# Patient Record
Sex: Female | Born: 1970
Health system: Southern US, Community
[De-identification: ages and names within clinical notes are randomized; demographics above are authoritative.]

## PROBLEM LIST (undated history)

## (undated) DIAGNOSIS — F419 Anxiety disorder, unspecified: Secondary | ICD-10-CM

## (undated) DIAGNOSIS — Z87442 Personal history of urinary calculi: Secondary | ICD-10-CM

## (undated) DIAGNOSIS — M797 Fibromyalgia: Secondary | ICD-10-CM

## (undated) DIAGNOSIS — E041 Nontoxic single thyroid nodule: Secondary | ICD-10-CM

## (undated) DIAGNOSIS — F329 Major depressive disorder, single episode, unspecified: Secondary | ICD-10-CM

## (undated) DIAGNOSIS — E669 Obesity, unspecified: Secondary | ICD-10-CM

## (undated) DIAGNOSIS — R52 Pain, unspecified: Secondary | ICD-10-CM

## (undated) DIAGNOSIS — R109 Unspecified abdominal pain: Secondary | ICD-10-CM

## (undated) DIAGNOSIS — G459 Transient cerebral ischemic attack, unspecified: Secondary | ICD-10-CM

## (undated) DIAGNOSIS — G4730 Sleep apnea, unspecified: Secondary | ICD-10-CM

## (undated) DIAGNOSIS — T4145XA Adverse effect of unspecified anesthetic, initial encounter: Secondary | ICD-10-CM

## (undated) DIAGNOSIS — C439 Malignant melanoma of skin, unspecified: Secondary | ICD-10-CM

## (undated) DIAGNOSIS — F32A Depression, unspecified: Secondary | ICD-10-CM

## (undated) DIAGNOSIS — D649 Anemia, unspecified: Secondary | ICD-10-CM

## (undated) DIAGNOSIS — J329 Chronic sinusitis, unspecified: Secondary | ICD-10-CM

## (undated) DIAGNOSIS — R197 Diarrhea, unspecified: Secondary | ICD-10-CM

## (undated) DIAGNOSIS — N649 Disorder of breast, unspecified: Secondary | ICD-10-CM

## (undated) DIAGNOSIS — R739 Hyperglycemia, unspecified: Secondary | ICD-10-CM

## (undated) DIAGNOSIS — M542 Cervicalgia: Secondary | ICD-10-CM

## (undated) DIAGNOSIS — N390 Urinary tract infection, site not specified: Secondary | ICD-10-CM

## (undated) DIAGNOSIS — R002 Palpitations: Secondary | ICD-10-CM

## (undated) DIAGNOSIS — F319 Bipolar disorder, unspecified: Secondary | ICD-10-CM

## (undated) DIAGNOSIS — K219 Gastro-esophageal reflux disease without esophagitis: Secondary | ICD-10-CM

## (undated) DIAGNOSIS — B019 Varicella without complication: Secondary | ICD-10-CM

## (undated) DIAGNOSIS — T7840XA Allergy, unspecified, initial encounter: Secondary | ICD-10-CM

## (undated) DIAGNOSIS — E785 Hyperlipidemia, unspecified: Secondary | ICD-10-CM

## (undated) DIAGNOSIS — R0602 Shortness of breath: Secondary | ICD-10-CM

## (undated) HISTORY — DX: Hyperglycemia, unspecified: R73.9

## (undated) HISTORY — DX: Transient cerebral ischemic attack, unspecified: G45.9

## (undated) HISTORY — DX: Pain, unspecified: R52

## (undated) HISTORY — DX: Hyperlipidemia, unspecified: E78.5

## (undated) HISTORY — PX: COLONOSCOPY: SHX174

## (undated) HISTORY — DX: Fibromyalgia: M79.7

## (undated) HISTORY — DX: Diarrhea, unspecified: R19.7

## (undated) HISTORY — DX: Unspecified abdominal pain: R10.9

## (undated) HISTORY — DX: Disorder of breast, unspecified: N64.9

## (undated) HISTORY — PX: BACK SURGERY: SHX140

## (undated) HISTORY — DX: Allergy, unspecified, initial encounter: T78.40XA

## (undated) HISTORY — DX: Obesity, unspecified: E66.9

## (undated) HISTORY — DX: Chronic sinusitis, unspecified: J32.9

## (undated) HISTORY — DX: Malignant melanoma of skin, unspecified: C43.9

## (undated) HISTORY — DX: Bipolar disorder, unspecified: F31.9

## (undated) HISTORY — DX: Anxiety disorder, unspecified: F41.9

## (undated) HISTORY — DX: Major depressive disorder, single episode, unspecified: F32.9

## (undated) HISTORY — DX: Cervicalgia: M54.2

## (undated) HISTORY — DX: Palpitations: R00.2

## (undated) HISTORY — DX: Nontoxic single thyroid nodule: E04.1

## (undated) HISTORY — DX: Varicella without complication: B01.9

## (undated) HISTORY — DX: Urinary tract infection, site not specified: N39.0

## (undated) HISTORY — PX: CHOLECYSTECTOMY: SHX55

## (undated) HISTORY — PX: ABDOMINAL HYSTERECTOMY: SHX81

## (undated) HISTORY — DX: Depression, unspecified: F32.A

## (undated) HISTORY — DX: Anemia, unspecified: D64.9

---
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
## 1997-07-18 DIAGNOSIS — T8859XA Other complications of anesthesia, initial encounter: Secondary | ICD-10-CM

## 1997-07-18 HISTORY — DX: Other complications of anesthesia, initial encounter: T88.59XA

## 1997-07-18 HISTORY — PX: BACK SURGERY: SHX140

## 1997-10-15 ENCOUNTER — Emergency Department (HOSPITAL_COMMUNITY): Admission: EM | Admit: 1997-10-15 | Discharge: 1997-10-15 | Payer: Self-pay | Admitting: Emergency Medicine

## 1997-10-18 ENCOUNTER — Emergency Department (HOSPITAL_COMMUNITY): Admission: EM | Admit: 1997-10-18 | Discharge: 1997-10-18 | Payer: Self-pay | Admitting: Emergency Medicine

## 1997-10-26 ENCOUNTER — Ambulatory Visit (HOSPITAL_COMMUNITY): Admission: RE | Admit: 1997-10-26 | Discharge: 1997-10-26 | Payer: Self-pay | Admitting: Neurosurgery

## 1997-11-17 ENCOUNTER — Inpatient Hospital Stay (HOSPITAL_COMMUNITY): Admission: RE | Admit: 1997-11-17 | Discharge: 1997-11-20 | Payer: Self-pay | Admitting: Neurosurgery

## 1997-12-28 ENCOUNTER — Ambulatory Visit (HOSPITAL_COMMUNITY): Admission: RE | Admit: 1997-12-28 | Discharge: 1997-12-28 | Payer: Self-pay | Admitting: Neurosurgery

## 1998-07-03 ENCOUNTER — Encounter: Admission: RE | Admit: 1998-07-03 | Discharge: 1998-07-03 | Payer: Self-pay | Admitting: *Deleted

## 1998-08-31 ENCOUNTER — Other Ambulatory Visit: Admission: RE | Admit: 1998-08-31 | Discharge: 1998-08-31 | Payer: Self-pay | Admitting: *Deleted

## 1998-09-24 ENCOUNTER — Ambulatory Visit (HOSPITAL_COMMUNITY): Admission: RE | Admit: 1998-09-24 | Discharge: 1998-09-24 | Payer: Self-pay | Admitting: *Deleted

## 1998-09-24 ENCOUNTER — Encounter: Payer: Self-pay | Admitting: *Deleted

## 1999-01-13 ENCOUNTER — Emergency Department (HOSPITAL_COMMUNITY): Admission: EM | Admit: 1999-01-13 | Discharge: 1999-01-13 | Payer: Self-pay | Admitting: Emergency Medicine

## 1999-02-04 ENCOUNTER — Ambulatory Visit (HOSPITAL_COMMUNITY): Admission: RE | Admit: 1999-02-04 | Discharge: 1999-02-04 | Payer: Self-pay | Admitting: Family Medicine

## 1999-02-04 ENCOUNTER — Encounter: Payer: Self-pay | Admitting: Family Medicine

## 2000-03-14 ENCOUNTER — Emergency Department (HOSPITAL_COMMUNITY): Admission: EM | Admit: 2000-03-14 | Discharge: 2000-03-14 | Payer: Self-pay | Admitting: Emergency Medicine

## 2000-03-15 ENCOUNTER — Emergency Department (HOSPITAL_COMMUNITY): Admission: EM | Admit: 2000-03-15 | Discharge: 2000-03-15 | Payer: Self-pay | Admitting: Emergency Medicine

## 2000-03-15 ENCOUNTER — Encounter (INDEPENDENT_AMBULATORY_CARE_PROVIDER_SITE_OTHER): Payer: Self-pay | Admitting: *Deleted

## 2000-03-15 ENCOUNTER — Encounter: Payer: Self-pay | Admitting: Emergency Medicine

## 2000-04-07 ENCOUNTER — Other Ambulatory Visit: Admission: RE | Admit: 2000-04-07 | Discharge: 2000-04-07 | Payer: Self-pay | Admitting: Internal Medicine

## 2000-09-20 ENCOUNTER — Ambulatory Visit (HOSPITAL_COMMUNITY): Admission: RE | Admit: 2000-09-20 | Discharge: 2000-09-20 | Payer: Self-pay | Admitting: *Deleted

## 2000-11-29 ENCOUNTER — Ambulatory Visit (HOSPITAL_COMMUNITY): Admission: RE | Admit: 2000-11-29 | Discharge: 2000-11-29 | Payer: Self-pay | Admitting: Neurosurgery

## 2000-12-21 ENCOUNTER — Encounter: Admission: RE | Admit: 2000-12-21 | Discharge: 2000-12-21 | Payer: Self-pay | Admitting: Neurosurgery

## 2001-01-19 ENCOUNTER — Encounter: Admission: RE | Admit: 2001-01-19 | Discharge: 2001-01-19 | Payer: Self-pay | Admitting: Neurosurgery

## 2001-03-14 ENCOUNTER — Encounter: Admission: RE | Admit: 2001-03-14 | Discharge: 2001-03-14 | Payer: Self-pay | Admitting: Neurosurgery

## 2001-04-17 ENCOUNTER — Encounter: Admission: RE | Admit: 2001-04-17 | Discharge: 2001-07-16 | Payer: Self-pay | Admitting: Surgical Oncology

## 2001-05-25 ENCOUNTER — Ambulatory Visit (HOSPITAL_COMMUNITY): Admission: RE | Admit: 2001-05-25 | Discharge: 2001-05-25 | Payer: Self-pay | Admitting: Neurosurgery

## 2001-07-30 ENCOUNTER — Encounter: Admission: RE | Admit: 2001-07-30 | Discharge: 2001-10-28 | Payer: Self-pay

## 2004-06-25 ENCOUNTER — Emergency Department (HOSPITAL_COMMUNITY): Admission: EM | Admit: 2004-06-25 | Discharge: 2004-06-25 | Payer: Self-pay | Admitting: Emergency Medicine

## 2005-05-17 ENCOUNTER — Other Ambulatory Visit: Admission: RE | Admit: 2005-05-17 | Discharge: 2005-05-17 | Payer: Self-pay | Admitting: Internal Medicine

## 2005-07-24 ENCOUNTER — Emergency Department (HOSPITAL_COMMUNITY): Admission: EM | Admit: 2005-07-24 | Discharge: 2005-07-24 | Payer: Self-pay | Admitting: Emergency Medicine

## 2007-02-22 ENCOUNTER — Emergency Department (HOSPITAL_COMMUNITY): Admission: EM | Admit: 2007-02-22 | Discharge: 2007-02-22 | Payer: Self-pay | Admitting: Emergency Medicine

## 2009-07-18 HISTORY — PX: MELANOMA EXCISION: SHX5266

## 2009-08-07 ENCOUNTER — Ambulatory Visit: Payer: Self-pay | Admitting: Hematology and Oncology

## 2009-08-11 ENCOUNTER — Encounter (INDEPENDENT_AMBULATORY_CARE_PROVIDER_SITE_OTHER): Payer: Self-pay | Admitting: *Deleted

## 2009-08-11 LAB — CBC & DIFF AND RETIC
Basophils Absolute: 0 10*3/uL (ref 0.0–0.1)
Eosinophils Absolute: 0 10*3/uL (ref 0.0–0.5)
HGB: 12.5 g/dL (ref 11.6–15.9)
LYMPH%: 20.9 % (ref 14.0–49.7)
MONO#: 0.6 10*3/uL (ref 0.1–0.9)
NEUT%: 71.9 % (ref 38.4–76.8)
Platelets: 294 10*3/uL (ref 145–400)
WBC: 9.4 10*3/uL (ref 3.9–10.3)
lymph#: 2 10*3/uL (ref 0.9–3.3)

## 2009-08-11 LAB — URINALYSIS, MICROSCOPIC - CHCC
Bilirubin (Urine): NEGATIVE
Blood: NEGATIVE
Glucose: NEGATIVE g/dL
Ketones: NEGATIVE mg/dL
pH: 6 (ref 4.6–8.0)

## 2009-08-11 LAB — CHCC SMEAR

## 2009-08-13 LAB — FERRITIN: Ferritin: 6 ng/mL — ABNORMAL LOW (ref 10–291)

## 2009-08-13 LAB — PROTEIN ELECTROPHORESIS, SERUM, WITH REFLEX
Alpha-1-Globulin: 4.7 % (ref 2.9–4.9)
Alpha-2-Globulin: 11.7 % (ref 7.1–11.8)
Gamma Globulin: 15.9 % (ref 11.1–18.8)
Total Protein, Serum Electrophoresis: 7.4 g/dL (ref 6.0–8.3)

## 2009-08-13 LAB — COMPREHENSIVE METABOLIC PANEL
Chloride: 102 mEq/L (ref 96–112)
Creatinine, Ser: 0.78 mg/dL (ref 0.40–1.20)
Glucose, Bld: 91 mg/dL (ref 70–99)
Sodium: 136 mEq/L (ref 135–145)
Total Bilirubin: 0.4 mg/dL (ref 0.3–1.2)

## 2009-08-13 LAB — HAPTOGLOBIN: Haptoglobin: 187 mg/dL (ref 16–200)

## 2009-08-13 LAB — VITAMIN B12: Vitamin B-12: 856 pg/mL (ref 211–911)

## 2009-08-13 LAB — DIRECT ANTIGLOBULIN TEST (NOT AT ARMC): DAT IgG: NEGATIVE

## 2009-08-13 LAB — LACTATE DEHYDROGENASE: LDH: 129 U/L (ref 94–250)

## 2009-08-18 ENCOUNTER — Ambulatory Visit: Payer: Self-pay | Admitting: Internal Medicine

## 2009-08-18 DIAGNOSIS — K219 Gastro-esophageal reflux disease without esophagitis: Secondary | ICD-10-CM | POA: Insufficient documentation

## 2009-08-18 DIAGNOSIS — D509 Iron deficiency anemia, unspecified: Secondary | ICD-10-CM | POA: Insufficient documentation

## 2009-08-19 ENCOUNTER — Telehealth (INDEPENDENT_AMBULATORY_CARE_PROVIDER_SITE_OTHER): Payer: Self-pay | Admitting: *Deleted

## 2009-08-20 ENCOUNTER — Ambulatory Visit: Payer: Self-pay | Admitting: Internal Medicine

## 2009-08-25 ENCOUNTER — Encounter: Payer: Self-pay | Admitting: Internal Medicine

## 2009-09-10 ENCOUNTER — Ambulatory Visit: Payer: Self-pay | Admitting: Hematology and Oncology

## 2009-09-14 LAB — BASIC METABOLIC PANEL
CO2: 24 mEq/L (ref 19–32)
Calcium: 8.8 mg/dL (ref 8.4–10.5)
Chloride: 105 mEq/L (ref 96–112)
Creatinine, Ser: 0.81 mg/dL (ref 0.40–1.20)
Sodium: 140 mEq/L (ref 135–145)

## 2009-09-14 LAB — IRON AND TIBC
%SAT: 35 % (ref 20–55)
TIBC: 233 ug/dL — ABNORMAL LOW (ref 250–470)
UIBC: 151 ug/dL

## 2009-09-14 LAB — CBC WITH DIFFERENTIAL/PLATELET
BASO%: 0.4 % (ref 0.0–2.0)
EOS%: 3.3 % (ref 0.0–7.0)
MCH: 28.4 pg (ref 25.1–34.0)
MCHC: 33.1 g/dL (ref 31.5–36.0)
MONO#: 0.4 10*3/uL (ref 0.1–0.9)
NEUT%: 70.2 % (ref 38.4–76.8)
RBC: 4.76 10*6/uL (ref 3.70–5.45)
WBC: 6.3 10*3/uL (ref 3.9–10.3)
lymph#: 1.2 10*3/uL (ref 0.9–3.3)

## 2009-09-16 ENCOUNTER — Ambulatory Visit (HOSPITAL_COMMUNITY): Admission: RE | Admit: 2009-09-16 | Discharge: 2009-09-16 | Payer: Self-pay | Admitting: Hematology and Oncology

## 2010-02-04 ENCOUNTER — Ambulatory Visit: Payer: Self-pay | Admitting: Hematology and Oncology

## 2010-02-08 LAB — COMPREHENSIVE METABOLIC PANEL
AST: 10 U/L (ref 0–37)
Alkaline Phosphatase: 95 U/L (ref 39–117)
BUN: 9 mg/dL (ref 6–23)
Creatinine, Ser: 0.74 mg/dL (ref 0.40–1.20)
Glucose, Bld: 106 mg/dL — ABNORMAL HIGH (ref 70–99)
Potassium: 4 mEq/L (ref 3.5–5.3)
Total Bilirubin: 0.3 mg/dL (ref 0.3–1.2)

## 2010-02-08 LAB — CBC WITH DIFFERENTIAL/PLATELET
Basophils Absolute: 0 10*3/uL (ref 0.0–0.1)
EOS%: 1.5 % (ref 0.0–7.0)
HGB: 15.2 g/dL (ref 11.6–15.9)
LYMPH%: 19.9 % (ref 14.0–49.7)
MCH: 32.3 pg (ref 25.1–34.0)
MCV: 93.4 fL (ref 79.5–101.0)
MONO%: 6.3 % (ref 0.0–14.0)
NEUT%: 71.8 % (ref 38.4–76.8)
Platelets: 227 10*3/uL (ref 145–400)
RDW: 12.3 % (ref 11.2–14.5)

## 2010-02-08 LAB — FERRITIN: Ferritin: 143 ng/mL (ref 10–291)

## 2010-02-08 LAB — IRON AND TIBC
%SAT: 26 % (ref 20–55)
Iron: 62 ug/dL (ref 42–145)
TIBC: 237 ug/dL — ABNORMAL LOW (ref 250–470)
UIBC: 175 ug/dL

## 2010-03-08 ENCOUNTER — Ambulatory Visit (HOSPITAL_COMMUNITY): Payer: Self-pay | Admitting: Psychiatry

## 2010-03-10 ENCOUNTER — Ambulatory Visit (HOSPITAL_COMMUNITY): Payer: Self-pay | Admitting: Marriage and Family Therapist

## 2010-03-31 ENCOUNTER — Ambulatory Visit (HOSPITAL_COMMUNITY): Payer: Self-pay | Admitting: Marriage and Family Therapist

## 2010-04-02 ENCOUNTER — Ambulatory Visit (HOSPITAL_COMMUNITY): Payer: Self-pay | Admitting: Psychiatry

## 2010-04-21 ENCOUNTER — Ambulatory Visit (HOSPITAL_COMMUNITY): Payer: Self-pay | Admitting: Marriage and Family Therapist

## 2010-04-23 ENCOUNTER — Ambulatory Visit (HOSPITAL_COMMUNITY): Payer: Self-pay | Admitting: Psychiatry

## 2010-04-28 ENCOUNTER — Ambulatory Visit (HOSPITAL_COMMUNITY): Payer: Self-pay | Admitting: Marriage and Family Therapist

## 2010-05-06 ENCOUNTER — Ambulatory Visit (HOSPITAL_COMMUNITY): Payer: Self-pay | Admitting: Marriage and Family Therapist

## 2010-05-19 ENCOUNTER — Ambulatory Visit (HOSPITAL_COMMUNITY): Payer: Self-pay | Admitting: Marriage and Family Therapist

## 2010-05-26 ENCOUNTER — Ambulatory Visit (HOSPITAL_COMMUNITY): Payer: Self-pay | Admitting: Marriage and Family Therapist

## 2010-06-02 ENCOUNTER — Ambulatory Visit (HOSPITAL_COMMUNITY): Payer: Self-pay | Admitting: Marriage and Family Therapist

## 2010-06-09 ENCOUNTER — Ambulatory Visit (HOSPITAL_COMMUNITY): Payer: Self-pay | Admitting: Marriage and Family Therapist

## 2010-06-23 ENCOUNTER — Ambulatory Visit (HOSPITAL_COMMUNITY): Payer: Self-pay | Admitting: Marriage and Family Therapist

## 2010-06-23 ENCOUNTER — Ambulatory Visit (HOSPITAL_COMMUNITY): Payer: Self-pay | Admitting: Psychiatry

## 2010-06-30 ENCOUNTER — Ambulatory Visit (HOSPITAL_COMMUNITY): Payer: Self-pay | Admitting: Marriage and Family Therapist

## 2010-07-07 ENCOUNTER — Ambulatory Visit (HOSPITAL_COMMUNITY): Payer: Self-pay | Admitting: Marriage and Family Therapist

## 2010-07-14 ENCOUNTER — Ambulatory Visit (HOSPITAL_COMMUNITY): Payer: Self-pay | Admitting: Marriage and Family Therapist

## 2010-07-18 HISTORY — PX: CHOLECYSTECTOMY: SHX55

## 2010-08-04 ENCOUNTER — Ambulatory Visit (HOSPITAL_COMMUNITY): Admit: 2010-08-04 | Payer: Self-pay | Admitting: Marriage and Family Therapist

## 2010-08-06 ENCOUNTER — Ambulatory Visit: Payer: Self-pay | Admitting: Hematology and Oncology

## 2010-08-09 ENCOUNTER — Ambulatory Visit (HOSPITAL_COMMUNITY)
Admission: RE | Admit: 2010-08-09 | Discharge: 2010-08-09 | Payer: Self-pay | Source: Home / Self Care | Attending: Psychiatry | Admitting: Psychiatry

## 2010-08-10 ENCOUNTER — Ambulatory Visit (HOSPITAL_COMMUNITY)
Admission: RE | Admit: 2010-08-10 | Discharge: 2010-08-10 | Payer: Self-pay | Source: Home / Self Care | Attending: Marriage and Family Therapist | Admitting: Marriage and Family Therapist

## 2010-08-10 LAB — COMPREHENSIVE METABOLIC PANEL
CO2: 21 mEq/L (ref 19–32)
Calcium: 9.1 mg/dL (ref 8.4–10.5)
Chloride: 102 mEq/L (ref 96–112)
Creatinine, Ser: 0.86 mg/dL (ref 0.40–1.20)
Potassium: 4.1 mEq/L (ref 3.5–5.3)
Total Bilirubin: 0.4 mg/dL (ref 0.3–1.2)

## 2010-08-10 LAB — CBC WITH DIFFERENTIAL/PLATELET
HGB: 14.8 g/dL (ref 11.6–15.9)
MCH: 31.2 pg (ref 25.1–34.0)
NEUT#: 7.3 10*3/uL — ABNORMAL HIGH (ref 1.5–6.5)
NEUT%: 74.7 % (ref 38.4–76.8)
RBC: 4.75 10*6/uL (ref 3.70–5.45)
RDW: 12.9 % (ref 11.2–14.5)
lymph#: 1.8 10*3/uL (ref 0.9–3.3)

## 2010-08-10 LAB — IRON AND TIBC
%SAT: 27 % (ref 20–55)
Iron: 80 ug/dL (ref 42–145)
TIBC: 294 ug/dL (ref 250–470)
UIBC: 214 ug/dL

## 2010-08-10 LAB — FERRITIN: Ferritin: 56 ng/mL (ref 10–291)

## 2010-08-12 ENCOUNTER — Ambulatory Visit (HOSPITAL_COMMUNITY): Admission: RE | Admit: 2010-08-12 | Payer: Self-pay | Source: Home / Self Care | Admitting: Psychiatry

## 2010-08-13 ENCOUNTER — Ambulatory Visit (HOSPITAL_COMMUNITY)
Admission: RE | Admit: 2010-08-13 | Discharge: 2010-08-13 | Payer: Self-pay | Source: Home / Self Care | Attending: Psychiatry | Admitting: Psychiatry

## 2010-08-17 NOTE — Assessment & Plan Note (Signed)
Summary: Iron Deficiency Anemia / GERD   History of Present Illness Visit Type: Initial Consult Primary GI MD: Yancey Flemings MD Primary Provider: Lucky Cowboy, MD Requesting Provider: Arlan Organ, MD Chief Complaint: Anemia & GERD History of Present Illness:   40 year old female with an anxiety disorder, depression, fibromyalgia, morbid obesity, and GERD. She is referred to the office today regarding iron deficiency anemia and refractory reflux symptoms. Her history dates back to November 2010. At that time she had significant fatigue. Blood work revealed her deficiency anemia with a hemoglobin of 9.7, MCV of 75, as well as low iron saturation and ferritin. She was treated with nuiron t.i.d. One month later, her CBC improved 12.3.Marland Kitchen She did not tolerate oral iron well due to GI disturbance. Thus, she recently received an iron infusion. No problems. Hemoccult studies have been obtained and returned negative. She denies melena or hematochezia. She has regular menstrual periods with heavy flow. GI review of systems is remarkable for significant pyrosis and regurgitation. She's had significant weight gain over the past year. 12 pounds in the past 2 months. Her reflux symptoms have been worse for about 3 months. She will wake up with symptoms. Previously, once day PPI therapy was effective. Subsequently ineffective. Now taking ranitidine OTC 2 or 3 times a day without relief. No dysphagia. Some nausea and epigastric discomfort. She takes NSAIDs periodically for menstrual pain, headaches and joint aches.. Her father apparently had Crohn's disease found on autopsy.   GI Review of Systems    Reports abdominal pain, acid reflux, belching, bloating, chest pain, heartburn, nausea, and  weight gain.     Location of  Abdominal pain: epigastric area.    Denies dysphagia with liquids, dysphagia with solids, loss of appetite, vomiting, vomiting blood, and  weight loss.        Denies anal fissure, black tarry  stools, change in bowel habit, constipation, diarrhea, diverticulosis, fecal incontinence, heme positive stool, hemorrhoids, irritable bowel syndrome, jaundice, light color stool, liver problems, rectal bleeding, and  rectal pain. Preventive Screening-Counseling & Management  Alcohol-Tobacco     Smoking Status: never      Drug Use:  no.      Current Medications (verified): 1)  Ranitidine Hcl 150 Mg Caps (Ranitidine Hcl) .... Three Times A Day 2)  Xanax 1 Mg Tabs (Alprazolam) .... At Bedtime For Sleep 3)  Tums 500 Mg Chew (Calcium Carbonate Antacid) .... As Needed 4)  Super B Complex  Tabs (B Complex-C) .... Once Daily 5)  Super Biotin 5000 Mcg Tabs (Biotin) .... Once Daily 6)  Calcium 600 1500 Mg Tabs (Calcium Carbonate) .... Take 4 Tablets Daily 7)  Coenzyme Q10 100 Mg Caps (Coenzyme Q10) .... Once Daily 8)  Vitamin D3 2000 Unit Caps (Cholecalciferol) .... Take 1 Capsule By Mouth 5 Times Daily 9)  Multivitamins  Tabs (Multiple Vitamin) .... Once Daily  Allergies (verified): 1)  ! Tegretol  Past History:  Past Medical History: Anemia 11/10 Anxiety Disorder 2000 Depression 2000 Fibromyalgia  Obesity 2000  Past Surgical History: Back Surgery 5/99  Family History: No FH of Colon Cancer: Family History of Colitis/Crohn's: Father Family History of Diabetes: GF Family History of Heart Disease:GF, Father  Family History of Irritable Bowel Syndrome:Mother Family History of Kidney Disease:GF  Social History: Occupation: Unemployed Patient has never smoked.  Alcohol Use - no Daily Caffeine Use Illicit Drug Use - no Smoking Status:  never Drug Use:  no  Review of Systems  anxiety, depression, fatigue, headaches, menstrual pain, muscle cramps and aches, shortness of breath, sleeping problems, sore throat, swelling of the ankles, excessive thirst. All other systems reviewed and reported as negative  Vital Signs:  Patient profile:   40 year old female Height:      69  inches Weight:      271.25 pounds BMI:     40.20 Pulse rate:   60 / minute Pulse rhythm:   regular BP sitting:   94 / 68  (right arm) Cuff size:   large  Vitals Entered By: June McMurray CMA Duncan Dull) (August 18, 2009 9:42 AM)  Physical Exam  General:  Well developed, obese,well nourished, no acute distress. Head:  Normocephalic and atraumatic. Eyes:  PERRLA, no icterus. Ears:  Normal auditory acuity. Nose:  No deformity, discharge,  or lesions. Mouth:  No deformity or lesions, dentition normal. Neck:  Supple; no masses or thyromegaly. Lungs:  Clear throughout to auscultation. Heart:  Regular rate and rhythm; no murmurs, rubs,  or bruits. Abdomen:  Soft, obese,nontender and nondistended. No masses, hepatosplenomegaly or hernias noted. Normal bowel sounds. Rectal:  deferred until colonoscopy Msk:  Symmetrical with no gross deformities. Normal posture. Pulses:  Normal pulses noted. Extremities:  No clubbing, cyanosis, edema or deformities noted. Neurologic:  Alert and  oriented x4;  grossly normal neurologically. Skin:  Intact without significant lesions or rashes.tattooo on the ankle Psych:  Alert and cooperative. Normal mood and affect.   Impression & Recommendations:  Problem # 1:  ANEMIA, IRON DEFICIENCY (ICD-29.56) 40 year old female with iron deficiency anemia. GI symptoms include refractory reflux disease and epigastric pain. Hemoccult studies negative. Most likely cause for iron deficiency is chronic menstrual blood loss. Cannot rule out occult GI lesion.  Plan:  #1. Colonoscopy to rule out colonic mucosal lesion as a cause for her iron deficiency anemia. The nature of the procedure as well as the risks, benefits, and alternatives reviewed. She understood and agreed to proceed. #2. Movi prep prescribed. The patient instructed on its use. #3. Iron replacement therapy per Dr. Dalene Carrow. As the patient is intolerant to oral iron, she may need periodic infusions.  Problem # 2:   GERD (ICD-530.81) Refractory GERD. In addition to GERD symptoms, epigastric discomfort. Problem likely exacerbated by weight gain.  Plan: #1. Prescribed Dexilant 60 mg daily. Samples provided and a prescription submitted. #2. Reflux precautions with attention to weight loss #3. Upper endoscopy to evaluate chronic refractory GERD symptoms as well as iron deficiency anemia. The nature of the procedure as well as the risks, benefits, and alternatives were reviewed. She understood and agreed to proceed.  Other Orders: Colon/Endo (Colon/Endo)  Patient Instructions: 1)  Colon/Endo [facility] 08/20/09 1:30 pm 2)  Movi prep instructions given to patient. 3)  Movi prep Rx. sent to patient's pharmacy to pick up. 4)  Colonoscopy brochure given.  5)  Upper Endoscopy brochure given.  6)  Dexilant samples given to patient to take 1 capsule by mouth 30 minutes before breakfast. 7)  GERD brochure given to patient to read along with GERD precaution sheet. 8)  The medication list was reviewed and reconciled.  All changed / newly prescribed medications were explained.  A complete medication list was provided to the patient / caregiver. 9)  Copy: Dr. Thalia Party, Dr. Lucky Cowboy Prescriptions: MOVIPREP 100 GM  SOLR (PEG-KCL-NACL-NASULF-NA ASC-C) As per prep instructions.  #1 x 0   Entered by:   Milford Cage NCMA   Authorized by:   Wilhemina Bonito  Marina Goodell MD   Signed by:   Milford Cage NCMA on 08/18/2009   Method used:   Electronically to        [facility]. [address] [phone]* (retail)       [address]       [place], [place]  [address]       Ph: [phone] or [phone]       Fax: [phone]   RxID:   [id number]

## 2010-08-17 NOTE — Medication Information (Signed)
Summary: Generic Rx request for Dexilant/[facility]  Generic Rx request for Dexilant/[facility]   Imported By: Sherian Rein 08/27/2009 08:37:09  _____________________________________________________________________  External Attachment:    Type:   Image     Comment:   External Document

## 2010-08-17 NOTE — Procedures (Signed)
Summary: Upper Endoscopy  Patient: [name] Note: All result statuses are Final unless otherwise noted.  Tests: (1) Upper Endoscopy (EGD)   EGD Upper Endoscopy       DONE     [facility]     [address]     [place], [place]  [address]           ENDOSCOPY PROCEDURE REPORT           PATIENT:  [name], [name]  MR#:  [id number]     BIRTHDATE:  10/22/1970, 38 yrs. old  GENDER:  female           ENDOSCOPIST:  Wilhemina Bonito. Eda Keys, MD     Referred by:  Arlan Organ, M.D.           PROCEDURE DATE:  08/20/2009     PROCEDURE:  EGD with biopsy     ASA CLASS:  Class II     INDICATIONS:  iron deficiency anemia           MEDICATIONS:   There was residual sedation effect present from     prior procedure., Versed 2 mg IV     TOPICAL ANESTHETIC:  Exactacain Spray           DESCRIPTION OF PROCEDURE:   After the risks benefits and     alternatives of the procedure were thoroughly explained, informed     consent was obtained.  The LB GIF-H180 [id number] endoscope was     introduced through the mouth and advanced to the second portion of     the duodenum, without limitations.  The instrument was slowly     withdrawn as the mucosa was fully examined.     <<PROCEDUREIMAGES>>           Erosive Esophagitis was found in the distal esophagus.  A peptic     stricture was found in the distal esophagus.  A 4cm hiatal hernia     was found as well.  Otherwise the examination was normal to D2.     Retroflexed views revealed the hiatal hernia.    The scope was then     withdrawn from the patient and the procedure completed.           COMPLICATIONS:  None           ENDOSCOPIC IMPRESSION:     1) Erosive Esophagitis in the distal esophagus     2) Stricture in the distal esophagus     3) Hiatal hernia     4) Otherwise normal examination     5) GERD     6) GI follow up office visit with Dr. Marina Goodell in about 6 weeks           RECOMMENDATIONS:     1) continue PPI (Dexilant 60mg  daily)     2)  anti-reflux regimen to be followed     3) await biopsy results     4) Continued follow up with Drs. Odogwu and McKeown to treat and     follow iron deficiency           ______________________________     Wilhemina Bonito. Eda Keys, MD           CC:  Arlan Organ, MD, Lucky Cowboy, MD, The Patient           n.     eSIGNEDWilhemina Bonito. Eda Keys at 08/20/2009 02:37 PM  Sandrea Hughs, [id number]  Note: An exclamation mark (!) indicates a result that was not dispersed into the flowsheet. Document Creation Date: 08/20/2009 2:37 PM _______________________________________________________________________  (1) Order result status: Final Collection or observation date-time: 08/20/2009 14:28 Requested date-time:  Receipt date-time:  Reported date-time:  Referring Physician:   Ordering Physician: Fransico Setters [phone]) Specimen Source:  Source: Launa Grill Order Number: [id number] Lab site:

## 2010-08-17 NOTE — Progress Notes (Signed)
Summary: Colon tomorrow  Phone Note Call from Patient Call back at [phone]   Call For: Dr Marina Goodell Summary of Call: Can she take tums with her liquid diet? Initial call taken by: Leanor Kail [facility],  August 19, 2009 12:57 PM  Follow-up for Phone Call        Pt instructed not to use tums with liquid diet prior to procedure. Pt returned verbal understanding of instructions. Follow-up by: Joylene John RN,  August 19, 2009 1:53 PM

## 2010-08-17 NOTE — Letter (Signed)
Summary: Patient [facility] Biopsy Results  [facility]  [address] [place], [place] [address]   Phone: [phone]  Fax: [phone]        August 25, 2009 MRN: [id number]    [facility] [name] [address] [place], [place]  [address]    Dear [name],  I am pleased to inform you that the biopsies taken during your recent endoscopic examination were normal and did not show any evidence of celiac sprue upon pathologic examination.  Additional information/recommendations:   __ Continue with the treatment plan as outlined on the day of your      exam on your procedure reports.    Please call us if you are having persistent problems or have questions about your condition that have not been fully answered at this time.  Sincerely,  Hilarie Fredrickson MD  This letter has been electronically signed by your physician.  Appended Document: Patient Notice-Endo Biopsy Results Letter mailed 2.10.11

## 2010-08-17 NOTE — Letter (Signed)
Summary: [facility] Instructions  [facility]  [address] [place], [place] [address]   Phone: [phone]  Fax: [phone]       [facility] LEWIS    [birth year]    MRN: [id number]        Procedure Day /Date:THURSDAY, 08/20/09     Arrival Time:12:30 PM     Procedure Time:1:30 PM     Location of Procedure:                    X  [facility] (4th Floor)                        PREPARATION FOR COLONOSCOPY WITH MOVIPREP/ENDO   Starting 5 days prior to your procedure START NOW do not eat nuts, seeds, popcorn, corn, beans, peas,  salads, or any raw vegetables.  Do not take any fiber supplements (e.g. Metamucil, Citrucel, and Benefiber).  THE DAY BEFORE YOUR PROCEDURE         DATE: 08/19/09  DAY: WEDNESDAY  1.  Drink clear liquids the entire day-NO SOLID FOOD  2.  Do not drink anything colored red or purple.  Avoid juices with pulp.  No orange juice.  3.  Drink at least 64 oz. (8 glasses) of fluid/clear liquids during the day to prevent dehydration and help the prep work efficiently.  CLEAR LIQUIDS INCLUDE: Water Jello Ice Popsicles Tea (sugar ok, no milk/cream) Powdered fruit flavored drinks Coffee (sugar ok, no milk/cream) Gatorade Juice: apple, white grape, white cranberry  Lemonade Clear bullion, consomm, broth Carbonated beverages (any kind) Strained chicken noodle soup Hard Candy                             4.  In the morning, mix first dose of MoviPrep solution:    Empty 1 Pouch A and 1 Pouch B into the disposable container    Add lukewarm drinking water to the top line of the container. Mix to dissolve    Refrigerate (mixed solution should be used within 24 hrs)  5.  Begin drinking the prep at 5:00 p.m. The MoviPrep container is divided by 4 marks.   Every 15 minutes drink the solution down to the next mark (approximately 8 oz) until the full liter is complete.   6.  Follow completed prep with 16 oz of clear liquid of your choice  (Nothing red or purple).  Continue to drink clear liquids until bedtime.  7.  Before going to bed, mix second dose of MoviPrep solution:    Empty 1 Pouch A and 1 Pouch B into the disposable container    Add lukewarm drinking water to the top line of the container. Mix to dissolve    Refrigerate  THE DAY OF YOUR PROCEDURE      DATE: 08/20/09 FIE:PPIRJJOA  Beginning at 8:30 a.m. (5 hours before procedure):         1. Every 15 minutes, drink the solution down to the next mark (approx 8 oz) until the full liter is complete.  2. Follow completed prep with 16 oz. of clear liquid of your choice.    3. You may drink clear liquids until 11:30 AM (2 HOURS BEFORE PROCEDURE).   MEDICATION INSTRUCTIONS  Unless otherwise instructed, you should take regular prescription medications with a small sip of water   as early as possible the morning of your procedure.  OTHER INSTRUCTIONS  You will need a responsible adult at least 41 years of age to accompany you and drive you home.   This person must remain in the waiting room during your procedure.  Wear loose fitting clothing that is easily removed.  Leave jewelry and other valuables at home.  However, you may wish to bring a book to read or  an iPod/MP3 player to listen to music as you wait for your procedure to start.  Remove all body piercing jewelry and leave at home.  Total time from sign-in until discharge is approximately 2-3 hours.  You should go home directly after your procedure and rest.  You can resume normal activities the  day after your procedure.  The day of your procedure you should not:   Drive   Make legal decisions   Operate machinery   Drink alcohol   Return to work  You will receive specific instructions about eating, activities and medications before you leave.    The above instructions have been reviewed and explained to me by   _______________________    I fully understand and can verbalize  these instructions _____________________________ Date _________

## 2010-08-17 NOTE — Miscellaneous (Signed)
Summary: Dexilant rx  Clinical Lists Changes  Medications: Added new medication of DEXILANT 60 MG CPDR (DEXLANSOPRAZOLE) 1 by mouth daily- take 1 tablet 30 minutes before breakfast - Signed Rx of DEXILANT 60 MG CPDR (DEXLANSOPRAZOLE) 1 by mouth daily- take 1 tablet 30 minutes before breakfast;  #30 x 5;  Signed;  Entered by: Karl Bales RN;  Authorized by: Hilarie Fredrickson MD;  Method used: Electronically to [facility]. [address] [phone]*, [address], [place], [place], [place]  [address], Ph: [phone] or [phone], Fax: [phone]    Prescriptions: DEXILANT 60 MG CPDR (DEXLANSOPRAZOLE) 1 by mouth daily- take 1 tablet 30 minutes before breakfast  #30 x 5   Entered by:   Karl Bales RN   Authorized by:   Hilarie Fredrickson MD   Signed by:   Karl Bales RN on 08/20/2009   Method used:   Electronically to        [facility] [address] [phone]* (retail)       [address]       [place], [place]  [address]       Ph: [phone] or [phone]       Fax: [phone]   RxID:   [id number]

## 2010-08-17 NOTE — Letter (Signed)
Summary: New Patient letter  [facility]  [address] [place], [place] [address]   Phone: [phone]  Fax: [phone]       08/11/2009 MRN: [id number]  [facility] LEWIS [address] [place], [place]  [address]  Dear Ms. LEWIS,  Welcome to the Gastroenterology Division at [facility].    You are scheduled to see Dr. Marina Goodell on 08-18-09 at 9:30a.m. on the 3rd floor at [facility], [address]  We ask that you try to arrive at our office 15 minutes prior to your appointment time to allow for check-in.  We would like you to complete the enclosed self-administered evaluation form prior to your visit and bring it with you on the day of your appointment.  We will review it with you.  Also, please bring a complete list of all your medications or, if you prefer, bring the medication bottles and we will list them.  Please bring your insurance card so that we may make a copy of it.  If your insurance requires a referral to see a specialist, please bring your referral form from your primary care physician.  Co-payments are due at the time of your visit and may be paid by cash, check or credit card.     Your office visit will consist of a consult with your physician (includes a physical exam), any laboratory testing he/she may order, scheduling of any necessary diagnostic testing (e.g. x-ray, ultrasound, CT-scan), and scheduling of a procedure (e.g. Endoscopy, Colonoscopy) if required.  Please allow enough time on your schedule to allow for any/all of these possibilities.    If you cannot keep your appointment, please call [phone] to cancel or reschedule prior to your appointment date.  This allows [place] the opportunity to schedule an appointment for another patient in need of care.  If you do not cancel or reschedule by 5 p.m. the business day prior to your appointment date, you will be charged a $50.00 late cancellation/no-show fee.    Thank you for choosing  [facility] for your medical needs.  We appreciate the opportunity to care for you.  Please visit [place] at our website  to learn more about our practice.                     Sincerely,                                                             The Gastroenterology Division

## 2010-08-17 NOTE — Procedures (Signed)
Summary: Colonoscopy  Patient: [name] Note: All result statuses are Final unless otherwise noted.  Tests: (1) Colonoscopy (COL)   COL Colonoscopy           DONE     [facility]     [address]     [place], [place]  [address]           COLONOSCOPY PROCEDURE REPORT           PATIENT:  [name], [name]  MR#:  [id number]     BIRTHDATE:  Apr 16, 1971, 38 yrs. old  GENDER:  female           ENDOSCOPIST:  Wilhemina Bonito. Eda Keys, MD     Referred by:  Arlan Organ, M.D.           PROCEDURE DATE:  08/20/2009     PROCEDURE:  Colonoscopy, Diagnostic     ASA CLASS:  Class II     INDICATIONS:  iron deficiency anemia           MEDICATIONS:   Fentanyl 100 mcg IV, Versed 10 mg IV           DESCRIPTION OF PROCEDURE:   After the risks benefits and     alternatives of the procedure were thoroughly explained, informed     consent was obtained.  Digital rectal exam was performed and     revealed no abnormalities.   The LB CF-H180AL [id number] endoscope     was introduced through the anus and advanced to the cecum, which     was identified by both the appendix and ileocecal valve, without     limitations. Time to cecum = 2:43 min. The quality of the prep was     excellent, using MoviPrep.  The instrument was then slowly     withdrawn (time = 9:43 min) as the colon was fully examined.     <<PROCEDUREIMAGES>>           FINDINGS:  A normal appearing cecum, ileocecal valve, and     appendiceal orifice were identified. The ascending, hepatic     flexure, transverse, splenic flexure, descending, sigmoid colon,     and rectum appeared unremarkable.  The terminal ileum appeared     normal.   Retroflexed views in the rectum revealed no     abnormalities.    The scope was then withdrawn from the patient     and the procedure completed.           COMPLICATIONS:  None           ENDOSCOPIC IMPRESSION:     1) Normal colon     2) Normal terminal ileum     RECOMMENDATIONS:     1) Continue current  colorectal screening recommendations for     "routine risk" patients with a repeat colonoscopy in 11 years (age     89).     2) EGD today           ______________________________     Wilhemina Bonito. Eda Keys, MD           CC:  Arlan Organ, MD; Lucky Cowboy, MD; The Patient           n.     eSIGNED:   Wilhemina Bonito. Eda Keys at 08/20/2009 02:23 PM           Sandrea Hughs, [id number]  Note: An exclamation mark (!) indicates a result that was not dispersed into the  flowsheet. Document Creation Date: 08/20/2009 2:23 PM _______________________________________________________________________  (1) Order result status: Final Collection or observation date-time: 08/20/2009 14:18 Requested date-time:  Receipt date-time:  Reported date-time:  Referring Physician:   Ordering Physician: Fransico Setters [phone]) Specimen Source:  Source: Launa Grill Order Number: [id number] Lab site:   Appended Document: Colonoscopy    Clinical Lists Changes  Observations: Added new observation of COLONNXTDUE: 08/2020 (08/20/2009 14:59)      Appended Document: Colonoscopy recall     Procedures Next Due Date:    Colonoscopy: 08/2020

## 2010-08-18 ENCOUNTER — Ambulatory Visit (HOSPITAL_COMMUNITY): Admit: 2010-08-18 | Payer: Self-pay | Admitting: Marriage and Family Therapist

## 2010-08-18 ENCOUNTER — Encounter (HOSPITAL_COMMUNITY): Payer: Self-pay | Admitting: Marriage and Family Therapist

## 2010-08-18 DIAGNOSIS — F39 Unspecified mood [affective] disorder: Secondary | ICD-10-CM

## 2010-08-26 ENCOUNTER — Encounter (HOSPITAL_COMMUNITY): Payer: Self-pay | Admitting: Marriage and Family Therapist

## 2010-08-26 DIAGNOSIS — F39 Unspecified mood [affective] disorder: Secondary | ICD-10-CM

## 2010-09-02 ENCOUNTER — Encounter (HOSPITAL_COMMUNITY): Payer: Self-pay | Admitting: Marriage and Family Therapist

## 2010-09-03 ENCOUNTER — Emergency Department (HOSPITAL_COMMUNITY): Payer: Self-pay

## 2010-09-03 ENCOUNTER — Observation Stay (HOSPITAL_COMMUNITY)
Admission: EM | Admit: 2010-09-03 | Discharge: 2010-09-08 | Disposition: A | Discharge status: Home / Self Care | Payer: Self-pay | Attending: Internal Medicine | Admitting: Internal Medicine

## 2010-09-03 DIAGNOSIS — R112 Nausea with vomiting, unspecified: Secondary | ICD-10-CM | POA: Insufficient documentation

## 2010-09-03 DIAGNOSIS — E669 Obesity, unspecified: Secondary | ICD-10-CM | POA: Insufficient documentation

## 2010-09-03 DIAGNOSIS — K449 Diaphragmatic hernia without obstruction or gangrene: Secondary | ICD-10-CM | POA: Insufficient documentation

## 2010-09-03 DIAGNOSIS — R5082 Postprocedural fever: Secondary | ICD-10-CM | POA: Insufficient documentation

## 2010-09-03 DIAGNOSIS — F319 Bipolar disorder, unspecified: Secondary | ICD-10-CM | POA: Insufficient documentation

## 2010-09-03 DIAGNOSIS — D72829 Elevated white blood cell count, unspecified: Secondary | ICD-10-CM | POA: Insufficient documentation

## 2010-09-03 DIAGNOSIS — D649 Anemia, unspecified: Secondary | ICD-10-CM | POA: Insufficient documentation

## 2010-09-03 DIAGNOSIS — K8010 Calculus of gallbladder with chronic cholecystitis without obstruction: Principal | ICD-10-CM | POA: Insufficient documentation

## 2010-09-03 LAB — CBC
MCH: 31.6 pg (ref 26.0–34.0)
MCHC: 34.1 g/dL (ref 30.0–36.0)
MCV: 92.7 fL (ref 78.0–100.0)
Platelets: 276 10*3/uL (ref 150–400)
RDW: 13 % (ref 11.5–15.5)

## 2010-09-03 LAB — POCT PREGNANCY, URINE: Preg Test, Ur: NEGATIVE

## 2010-09-03 LAB — POCT CARDIAC MARKERS
CKMB, poc: <1 ng/mL — ABNORMAL LOW (ref 1.0–8.0)
Myoglobin, poc: 50 ng/mL (ref 12–200)
Myoglobin, poc: 50.3 ng/mL (ref 12–200)
Troponin i, poc: <0.05 ng/mL (ref 0.00–0.09)

## 2010-09-03 LAB — POCT I-STAT, CHEM 8
Glucose, Bld: 92 mg/dL (ref 70–99)
Hemoglobin: 15.6 g/dL — ABNORMAL HIGH (ref 12.0–15.0)
Potassium: 3.7 mEq/L (ref 3.5–5.1)
Sodium: 138 mEq/L (ref 135–145)

## 2010-09-03 LAB — COMPREHENSIVE METABOLIC PANEL
ALT: 10 U/L (ref 0–35)
Alkaline Phosphatase: 81 U/L (ref 39–117)
BUN: 9 mg/dL (ref 6–23)
CO2: 23 mEq/L (ref 19–32)
Calcium: 8.7 mg/dL (ref 8.4–10.5)
GFR calc non Af Amer: >60 mL/min (ref >60)
Glucose, Bld: 86 mg/dL (ref 70–99)
Sodium: 140 mEq/L (ref 135–145)

## 2010-09-03 LAB — DIFFERENTIAL
Basophils Absolute: 0 10*3/uL (ref 0.0–0.1)
Eosinophils Relative: 0 % (ref 0–5)
Lymphocytes Relative: 10 % — ABNORMAL LOW (ref 12–46)
Monocytes Absolute: 0.7 10*3/uL (ref 0.1–1.0)
Monocytes Relative: 4 % (ref 3–12)

## 2010-09-03 LAB — TROPONIN I: Troponin I: 0.06 ng/mL (ref 0.00–0.06)

## 2010-09-03 LAB — URINALYSIS, ROUTINE W REFLEX MICROSCOPIC
Bilirubin Urine: NEGATIVE
Nitrite: NEGATIVE
Protein, ur: NEGATIVE mg/dL
Urine Glucose, Fasting: NEGATIVE mg/dL
Urobilinogen, UA: 0.2 mg/dL (ref 0.0–1.0)

## 2010-09-03 LAB — PROTIME-INR: INR: 0.98 (ref 0.00–1.49)

## 2010-09-03 LAB — APTT: aPTT: 25 seconds (ref 24–37)

## 2010-09-03 LAB — CK TOTAL AND CKMB (NOT AT ARMC): Total CK: 45 U/L (ref 7–177)

## 2010-09-03 LAB — CARDIAC PANEL(CRET KIN+CKTOT+MB+TROPI): Relative Index: INVALID (ref 0.0–2.5)

## 2010-09-03 LAB — URINE MICROSCOPIC-ADD ON

## 2010-09-03 LAB — MAGNESIUM: Magnesium: 2.1 mg/dL (ref 1.5–2.5)

## 2010-09-04 ENCOUNTER — Observation Stay (HOSPITAL_COMMUNITY): Payer: Self-pay

## 2010-09-04 LAB — CARDIAC PANEL(CRET KIN+CKTOT+MB+TROPI)
CK, MB: 0.5 ng/mL (ref 0.3–4.0)
Relative Index: INVALID (ref 0.0–2.5)
Total CK: 55 U/L (ref 7–177)
Troponin I: 0.01 ng/mL (ref 0.00–0.06)

## 2010-09-04 LAB — BASIC METABOLIC PANEL
Chloride: 105 mEq/L (ref 96–112)
GFR calc Af Amer: >60 mL/min (ref >60)
Potassium: 3.7 mEq/L (ref 3.5–5.1)

## 2010-09-04 LAB — HEMOGLOBIN A1C
Hgb A1c MFr Bld: 4.6 % (ref <5.7)
Mean Plasma Glucose: 85 mg/dL (ref <117)

## 2010-09-04 LAB — D-DIMER, QUANTITATIVE: D-Dimer, Quant: 0.4 ug/mL-FEU (ref 0.00–0.48)

## 2010-09-04 LAB — LITHIUM LEVEL: Lithium Lvl: 0.54 mEq/L — ABNORMAL LOW (ref 0.80–1.40)

## 2010-09-04 LAB — CBC
HCT: 39.1 % (ref 36.0–46.0)
Hemoglobin: 13 g/dL (ref 12.0–15.0)
RBC: 4.23 MIL/uL (ref 3.87–5.11)

## 2010-09-05 LAB — COMPREHENSIVE METABOLIC PANEL
ALT: 16 U/L (ref 0–35)
Calcium: 8.5 mg/dL (ref 8.4–10.5)
Glucose, Bld: 108 mg/dL — ABNORMAL HIGH (ref 70–99)
Sodium: 139 mEq/L (ref 135–145)
Total Protein: 6.9 g/dL (ref 6.0–8.3)

## 2010-09-05 LAB — CBC
HCT: 40.4 % (ref 36.0–46.0)
MCHC: 33.2 g/dL (ref 30.0–36.0)
Platelets: 238 10*3/uL (ref 150–400)
RDW: 13.2 % (ref 11.5–15.5)

## 2010-09-05 LAB — DRUGS OF ABUSE SCREEN W/O ALC, ROUTINE URINE
Benzodiazepines.: NEGATIVE
Cocaine Metabolites: NEGATIVE
Opiate Screen, Urine: NEGATIVE
Phencyclidine (PCP): NEGATIVE
Propoxyphene: NEGATIVE

## 2010-09-06 ENCOUNTER — Other Ambulatory Visit: Payer: Self-pay | Admitting: Surgery

## 2010-09-06 LAB — COMPREHENSIVE METABOLIC PANEL
BUN: 3 mg/dL — ABNORMAL LOW (ref 6–23)
CO2: 24 mEq/L (ref 19–32)
Chloride: 108 mEq/L (ref 96–112)
Creatinine, Ser: 0.71 mg/dL (ref 0.4–1.2)
GFR calc non Af Amer: >60 mL/min (ref >60)
Glucose, Bld: 110 mg/dL — ABNORMAL HIGH (ref 70–99)
Total Bilirubin: 0.4 mg/dL (ref 0.3–1.2)

## 2010-09-06 LAB — CBC
Hemoglobin: 11.9 g/dL — ABNORMAL LOW (ref 12.0–15.0)
MCH: 30.1 pg (ref 26.0–34.0)
MCV: 93.2 fL (ref 78.0–100.0)
RBC: 3.95 MIL/uL (ref 3.87–5.11)

## 2010-09-06 LAB — DIFFERENTIAL
Eosinophils Absolute: 0.1 10*3/uL (ref 0.0–0.7)
Lymphs Abs: 1.5 10*3/uL (ref 0.7–4.0)
Monocytes Relative: 6 % (ref 3–12)
Neutro Abs: 10.4 10*3/uL — ABNORMAL HIGH (ref 1.7–7.7)
Neutrophils Relative %: 82 % — ABNORMAL HIGH (ref 43–77)

## 2010-09-07 LAB — COMPREHENSIVE METABOLIC PANEL
AST: 35 U/L (ref 0–37)
Albumin: 2.5 g/dL — ABNORMAL LOW (ref 3.5–5.2)
Alkaline Phosphatase: 73 U/L (ref 39–117)
Chloride: 108 mEq/L (ref 96–112)
Creatinine, Ser: 0.74 mg/dL (ref 0.4–1.2)
GFR calc Af Amer: >60 mL/min (ref >60)
Potassium: 3.8 mEq/L (ref 3.5–5.1)
Total Bilirubin: 0.5 mg/dL (ref 0.3–1.2)
Total Protein: 5.8 g/dL — ABNORMAL LOW (ref 6.0–8.3)

## 2010-09-07 LAB — CBC
Platelets: 230 10*3/uL (ref 150–400)
RBC: 4.24 MIL/uL (ref 3.87–5.11)
WBC: 10 10*3/uL (ref 4.0–10.5)

## 2010-09-08 ENCOUNTER — Encounter (HOSPITAL_COMMUNITY): Payer: Self-pay | Admitting: Marriage and Family Therapist

## 2010-09-08 NOTE — Op Note (Signed)
[name], [name]                 ACCOUNT NO.:  [id number]  MEDICAL RECORD NO.:  [id number]           PATIENT TYPE:  I  LOCATION:  2003                         FACILITY:  [facility]  PHYSICIAN:  Abigail Miyamoto, M.D. DATE OF BIRTH:  09-Sep-1970  DATE OF PROCEDURE:  09/06/2010 DATE OF DISCHARGE:                              OPERATIVE REPORT   PREOPERATIVE DIAGNOSIS:  Acute cholecystitis.  POSTOPERATIVE DIAGNOSIS:  Acute cholecystitis.  PROCEDURE:  Laparoscopic cholecystectomy.  SURGEON:  Abigail Miyamoto, MD  ASSISTANT:  Brayton El, PA-C  ANESTHESIA:  General endotracheal anesthesia and 0.5% Marcaine.  ESTIMATED BLOOD LOSS:  Minimal.  FINDINGS:  The patient found to have acute cholecystitis with cholelithiasis.  PROCEDURE IN DETAIL:  The patient was brought to the operating room, identified as [name].  She was placed supine on the operating table and general anesthesia was induced.  Her abdomen was then prepped and draped in usual sterile fashion.  Using a 15-blade, a small vertical incision was made above the umbilicus.  This was carried down to the fascia, which was then opened with a scalpel.  Hemostat was then used to pass through the peritoneal cavity under direct vision.  A 0-Vicryl pursestring suture was then placed around the fascial opening.  The Hasson port was placed through the opening and insufflation of the abdomen was begun.  A 5-mm port was then placed in the patient's epigastrium.  Two more placed in the right upper quadrant, all under direct vision.  Gallbladder was found to be distended and acutely inflamed.  I had to needle aspirate bile from the gallbladder in order to facilitate grasping.  The patient was also morbidly obese and had a large amount of omentum.  I had to place another 5-mm trocar in the right upper quadrant and use a grasper to clamp all the bowel and omentum out of the way in order to dissect the base of the gallbladder. It was  quite friable, the basic gallbladder was dissected out, I was able to achieve a critical window around the cystic duct.  This was clipped three times proximally, once distally, and transected.  The cystic artery was then identified and clipped proximally and distally and transected as well.  The gallbladder was then slowly dissected free from liver bed with electrocautery.  This was very difficult secondary to the patient's morbid obesity and fatty liver.  The gallbladder was entered and several stones spilled out.  Once I was able to finally completely remove the gallbladder from the liver bed, I placed an endosac and removed the incision at the umbilicus.  I then thoroughly irrigated the abdomen with normal saline.  I used the laparoscopic stones Excell Seltzer to scoop all the remaining gallstones out.  I then surveyed the abdomen and found no other visible gallstones.  I then irrigated the liver bed further and examined it and hemostasis appeared to be achieved. I then placed a piece of hemostatic Surgicel into the gallbladder fossa.  All ports were removed under direct vision and the abdomen was deflated.  All incisions were then anesthetized with Marcaine and closed with 4-0 Monocryl  subcuticular sutures.  The umbilicus was tied in place closing the fascial defect as well.  Steri-Strips and Band-Aids were then applied. The patient tolerated the procedure well.  All counts were correct at the end of the procedure.  The patient was then extubated in the operating room and taken in a stable condition to the recovery room.     Abigail Miyamoto, M.D.     DB/MEDQ  D:  09/06/2010  T:  09/07/2010  Job:  [id number]  Electronically Signed by Abigail Miyamoto M.D. on 09/08/2010 01:37:02 PM

## 2010-09-10 ENCOUNTER — Encounter (HOSPITAL_COMMUNITY): Payer: Self-pay | Admitting: Psychiatry

## 2010-09-10 NOTE — Discharge Summary (Signed)
[name], [name]                 ACCOUNT NO.:  [id number]  MEDICAL RECORD NO.:  [id number]           PATIENT TYPE:  I  LOCATION:  2003                         FACILITY:  [facility]  PHYSICIAN:  Andreas Blower, MD       DATE OF BIRTH:  October 29, 1970  DATE OF ADMISSION:  09/03/2010 DATE OF DISCHARGE:  09/08/2010                              DISCHARGE SUMMARY   PRIMARY CARE PHYSICIAN:  Lucky Cowboy, MD  DISCHARGE DIAGNOSES: 1. Cholecystitis, status post cholecystectomy on September 06, 2010. 2. Epigastric pain secondary to acute calculous cholecystitis. 3. History of depression. 4. Leukocytosis. 5. History of iron-deficiency anemia, resolved. 6. Obesity. 7. History of gastritis. 8. History of hiatal hernia.  DISCHARGE MEDICATIONS: 1. Hydrocodone/APAP 5/325 mg 1-2 tablets p.o. q.4 h. as needed for     pain. 2. Calcium carbonate 500 mg 3-4 tablets every 4 hours as needed for     indigestion. 3. Venlafaxine 75 mg 1-2 tablets p.o. twice daily, 1 tablet in the     morning and 2 tablets at night. 4. Lithium carbonate 300 mg in the morning and 600 mg at night. 5. Trazodone 50 mg p.o. q.p.m.  BRIEF ADMITTING HISTORY AND PHYSICAL:  [name] is a 40 year old Caucasian female with history of obesity, gastritis, hiatal hernia who presents with chest pain/epigastric pain with nausea, vomiting, and leukocytosis.  RADIOLOGY/IMAGING:  The patient had chest x-ray, two-view, on September 03, 2010, which shows no acute or significant abnormality.  Stable chest x-ray. The patient had an ultrasound of the abdomen on September 05, 2010, which showed gallstones with gallbladder wall thickening, possible pericholecystic fluid.  Area of examination with ultrasound transducer compatible with cholecystitis.  LABORATORY DATA:  CBC shows a white count of 10.0.  Initial white count on presentation was 16.4, hemoglobin 12.5, hematocrit 39.1, platelet count 230.  Electrolytes normal with a creatinine of  0.74.  Liver function tests normal except albumin is 2.5, calcium is 7.8, total protein is 5.8.  Troponins checked x3.  TSH was 2.83.  Urine pregnancy was negative.  Lithium level was 0.54.  Drug screen was negative.  UA was negative.  Helicobacter antibody IgG was less than 0.40.  HOSPITAL COURSE BY PROBLEM: 1. Epigastric pain/chest pain, most likely secondary to calculus     cholecystitis, status post cholecystectomy performed by [facility] on September 06, 2010.  Since then, her pain has improved.     The patient initially was started on Unasyn, which she was     continued up until the time of discharge.  Postoperatively, the     patient had low grade temperature of 100.5, most likely due to     surgery; has had no further episodes of fever after that particular     episode postoperatively. 2. History of depression, stable at this time, the patient was     continued on home medications. 3. Leukocytosis, most likely secondary to cholecystitis. 4. History of iron deficiency anemia, hemoglobin stable during the     course of hospital stay.  Time spent on discharge talking to the patient and coordinating  care was 25 minutes.   Andreas Blower, MD   SR/MEDQ  D:  09/08/2010  T:  09/09/2010  Job:  [id number]  Electronically Signed by Wardell Heath Tyana Butzer  on 09/10/2010 08:36:45 PM

## 2010-09-14 NOTE — Consult Note (Signed)
[name], [name]                 ACCOUNT NO.:  [id number]  MEDICAL RECORD NO.:  [id number]           PATIENT TYPE:  I  LOCATION:  2003                         FACILITY:  [facility]  PHYSICIAN:  Connelly Spruell A. Cache Decoursey, M.D.DATE OF BIRTH:  05-11-1971  DATE OF CONSULTATION:  09/05/2010 DATE OF DISCHARGE:                                CONSULTATION   DOCTOR REQUESTING CONSULTATION:  Rosanna Randy, MD  REASON FOR CONSULTATION:  Abdominal pain, gallstones.  HISTORY OF PRESENT ILLNESS:  The patient is a 40 year old female, admitted on September 03, 2010, due to epigastric chest pain.  Pain was sharp and came about 2 a.m. on the 17th.  The pain is associated with nausea, vomiting.  The pain is severe, located in the epigastrium.  She is admitted to Medical Service and evaluated.  Ultrasound obtained, which showed gallstones in gallbladder wall and consistent with leukocytosis.  We were asked to consult.  Currently, her pain is under better control.  Still has right upper quadrant epigastric pain, which is constant in nature, relieved by pain medicine.  No nausea or vomiting, but she is not eating currently.  PAST MEDICAL HISTORY: 1. Gastritis. 2. Hiatal hernia. 3. Obesity. 4. Anemia. 5. Bipolar disease.  PAST SURGICAL HISTORY:  Low back surgery.  No abdominal surgery.  ALLERGIES:  None.  MEDICATIONS: 1. Effexor 75 mg in the morning, 100 mg at night. 2. Trazodone 50 mg nightly. 3. Lithium 300 mg in the morning, 600 mg at night. 4. She is also currently on Unasyn.  FAMILY HISTORY:  Her mother with heart disease.  She has a history of substance abuse and alcohol abuse, but since 2009.  REVIEW OF SYSTEMS:  As above, otherwise negative x15.  PHYSICAL EXAMINATION:  VITAL SIGNS:  Temperature currently is 98, pulse 85, blood pressure 107/64. GENERAL APPEARANCE:  Pleasant female in no apparent distress. HEENT:  No jaundice.  Oropharynx is moist. NECK:  Supple, nontender.  Trachea  midline. PULMONARY:  Lung sounds are clear.  Chest wall motion is normal. CARDIOVASCULAR:  Regular rate and rhythm without rub, murmur, or gallop. EXTREMITIES:  Warm and well perfused. ABDOMEN:  Tender in the right upper quadrant.  Positive Murphy sign.  No mass.  No hernia, morbidly obese. EXTREMITIES:  No clubbing, cyanosis, nor edema. NEUROLOGIC:  Glasgow coma scale is 15.  Motor and sensory function is grossly intact.  Skin is intact without rash or decubiti.  DIAGNOSTIC STUDIES:  Ultrasound reveals a thickened gallbladder wall with gallstones and normal-sized common bile duct with pain over the site with compression.  She has a white count of 17,700.  Sodium 139, potassium 3.7, chloride 107, CO2 22, BUN 4, creatinine 0.18, glucose 108.  LFTs normal.  Hemoglobin 13.4, platelet count of 238,000.  IMPRESSION: 1. Acute cholecystitis. 2. Depression. 3. Morbid obesity.  PLAN:  Recommend laparoscopic cholecystectomy and cholangiogram in the morning.  Go ahead and get her set up for that for tomorrow.  Dr. Simeon Craft is the on-call doctor.  Risk of bleeding, infection, bile duct injury open procedure, injury to neighboring organs, to be inclusive not exclusive above:  Small  bowel, common duct, diaphragm, liver, stomach, and abdominal wall were discussed.  She understands the above and agrees to proceed.  Further medical management of this point would not be in her best interest and I do not think cholecystostomy tube be warranted here.  She agrees to proceed.     Kathelene Rumberger A. Gicela Schwarting, M.D.     TAC/MEDQ  D:  09/05/2010  T:  09/06/2010  Job:  [id number]  Electronically Signed by Harriette Bouillon M.D. on 09/14/2010 08:45:38 AM

## 2010-11-15 ENCOUNTER — Encounter (HOSPITAL_COMMUNITY): Payer: Self-pay | Admitting: Marriage and Family Therapist

## 2010-11-23 ENCOUNTER — Encounter: Payer: Self-pay | Admitting: Internal Medicine

## 2010-11-23 ENCOUNTER — Ambulatory Visit: Payer: Self-pay | Admitting: Internal Medicine

## 2010-11-23 ENCOUNTER — Ambulatory Visit (INDEPENDENT_AMBULATORY_CARE_PROVIDER_SITE_OTHER): Payer: Self-pay | Admitting: Internal Medicine

## 2010-11-23 VITALS — BP 124/90 | HR 68 | Ht 69.0 in | Wt 290.0 lb

## 2010-11-23 DIAGNOSIS — E669 Obesity, unspecified: Secondary | ICD-10-CM

## 2010-11-23 DIAGNOSIS — K760 Fatty (change of) liver, not elsewhere classified: Secondary | ICD-10-CM

## 2010-11-23 DIAGNOSIS — K219 Gastro-esophageal reflux disease without esophagitis: Secondary | ICD-10-CM

## 2010-11-23 DIAGNOSIS — K7689 Other specified diseases of liver: Secondary | ICD-10-CM

## 2010-11-23 MED ORDER — ESOMEPRAZOLE MAGNESIUM 40 MG PO CPDR: 40.0000 mg | DELAYED_RELEASE_CAPSULE | Freq: Every day | ORAL | Status: DC

## 2010-11-23 NOTE — Progress Notes (Signed)
HISTORY OF PRESENT ILLNESS:  [name] is a 40 y.o. female with anxiety disorder, bipolar disorder with depressive tendencies, fibromyalgia, morbid obesity, and GERD. She was last seen in the office August 18, 2009 for iron deficiency anemia and refractory reflux disease. She subsequently underwent colonoscopy and upper endoscopy 08/20/2009. Complete colonoscopy including intubation of the terminal ileum was normal. Routine followup at age 17 recommended. Upper endoscopy revealed erosive esophagitis and a peptic stricture as well as a hiatal hernia. She was given Dexilant samples, a Dexilant prescription, and asked to followup with her primary providers. She did not feel the Dexilant prescription do to cause. 2 months ago she underwent cholecystectomy for acute cholecystitis. She has had worsening reflux over the past year and reports a 40 pound weight gain over the past 6 months. Symptoms include pyrosis, nausea, and significant regurgitation with occasional frank vomiting. No dominant pain or lower GI complaints. She does not have health coverage. She reports having received several weeks of Nexium samples, which helped. Had symptoms since running out of medication. She does not exercise, diet, or take her psychiatric medications as prescribed. She inquires about fatty liver, as noted on her operative report.  REVIEW OF SYSTEMS:  All non-GI ROS negative except for depression.  Past Medical History  Diagnosis Date  . Anemia   . Anxiety disorder   . Depression   . Fibromyalgia   . Obesity   . Melanoma     right hip and knee    Past Surgical History  Procedure Date  . Back surgery   . Cholecystectomy     Social History [name]  reports that she has never smoked. She has never used smokeless tobacco. She reports that she does not drink alcohol or use illicit drugs.  family history includes Colitis in her father; Diabetes in an unspecified family member; Heart disease in her  father; Irritable bowel syndrome in an unspecified family member; and Kidney disease in an unspecified family member.  There is no history of Colon cancer.  Allergies  Allergen Reactions  . Carbamazepine        PHYSICAL EXAMINATION:  Vital signs: BP 124/90  Pulse 68  Ht 5\' 9"  (1.753 m)  Wt 290 lb (131.543 kg)  BMI 42.83 kg/m2  LMP 11/02/2010 General: Well-developed, well-nourished, no acute distress HEENT: Sclerae are anicteric, conjunctiva pink. Oral mucosa intact Lungs: Clear Heart: Regular Abdomen: soft, abuse, nontender, nondistended, no obvious ascites, no peritoneal signs, normal bowel sounds. No organomegaly. Extremities: No edema Psychiatric: alert and oriented x3. Cooperative    ASSESSMENT:  #1. GERD on ranitidine. Previous endoscopy revealing erosive esophagitis and stricture #2. Morbid obesity #3. Fatty liver   PLAN: #1. Reflux precautions with attention to weight loss. Consider a nutritionalist #2. Sensible and regulated exercise. Consider a trainer #3. One month of Nexium samples provided. The patient will try to get drug assistance from the parent Company #4. Literature on GERD and reflux precautions #5. Discussion on fatty liver disease, and the importance in patients with NASH and is possible progression to cirrhosis. Weight loss and exercise recommended. Literature on the topic provided. #6. GI followup when necessary

## 2010-11-23 NOTE — Patient Instructions (Signed)
Nexium samples given take 1 daily. Brochure on Fatty liver, GERD and reflux precautions/prevention diet for you to review.

## 2010-12-06 ENCOUNTER — Encounter (HOSPITAL_COMMUNITY): Payer: Self-pay | Admitting: Psychiatry

## 2010-12-06 DIAGNOSIS — F3189 Other bipolar disorder: Secondary | ICD-10-CM

## 2010-12-08 NOTE — Consult Note (Signed)
NAMEGEANIE, [name]                 ACCOUNT NO.:  [id number]  MEDICAL RECORD NO.:  [id number]            LOCATION:  [facility]                           FACILITY:  [facility]  PHYSICIAN:  Yvette Loveless T. Risha Barretta, M.D.   DATE OF BIRTH:  11-03-1970  DATE 12/06/2010                                 Progress Note   The patient came in today for follow-up appointment.  She was last seen in January.  After that the patient had  gallbladder surgery and she was admitted to the [facility]. Surgery was done and after that when the patient went  home, she realized she has gained a lot of weight and she had decided to stop taking all psychiatric medication.  The patient went into deep depression,  complaining of agitation and mood swings, anger and feels like sometime choking the people though she did not have any violence or aggressive behavior but she has been noticing easily irritable and angry.  She has been sleeping poorly and admitted that she has been using her grandma's Ativan on occasion to help sleep as she ran out from her trazodone and Restoril which was actually not helping.  The patient is very concerned about her weight which has been increased in the last year from 250 to 290.  However, since she stopped taking the psychotropic medication,  her weight does not decrease and remains the same.  She admitted that she has a sugar craving and she has been drinking sweet ice tea and eating high calorie sugar food.  She admitted social isolation,  withdrawn,  and occasional passive suicidal thinking, however, she denies any auditory hallucinations and homicidal thoughts.  She has call us and decided to  resume her lithium as she noticed that psychotropic medication were not causing weight gain.  She has also scheduled to see the therapist in this office for increased coping and social skills.  The patient has s recently tried to make an appointment with the wellness program to see if she can get  phentermine for weight loss.  The patient has no primary care doctor.  She was not happy with the staff of her previous primary care doctor.  She reported her last labs were done when she was in the [facility] for gallbladder surgery.  MENTAL STATUS EXAM:  The patient is casually dressed.  She is tearful. She described her mood is depressed.  Her affect is constricted.  She admitted some mood swings but she denies any auditory hallucinations, suicidal thoughts or homicidal thoughts.  Her attention and concentration were distracted at times. At times she was tearful. There was no psychosis.  She described her mood as depressed and affect was constricted.  She is alert and oriented x3.  Her insight, judgment, and impulse control were okay.  DIAGNOSIS:  AXIS I:  Bipolar disorder.  PLAN:  As above.  The patient now has decided to resume her lithium which actually helped her in the past for her mood swings and depression.  We will resume her lithium 300 mg twice a day at this time. I also strongly encouraged  not to use someone else's benzodiazepine.  In the past she has been given Restoril and trazodone for sleep but that did not help.  We will provide Ativan 0.5 mg one time until the lithium gets into system.  I recommended not to take more than prescribed and discussed in detail about the tolerance withdrawal,  a dependency issue. I have strongly encouraged her to participate. .  I strongly encouraged her to become involved in exercise and try to watch her diet and restart therapy with Bernadene Bell in this office.  The patient is also trying to get an appointment with the [facility] for continued education and workup on her increased weight. Today her weight was 294. We will also keep a close eye on her weight and other side effects.  I have recommended that if she feels worsening of the symptoms or any time having strong suicidal thoughts then she needs to call  9-1-1 or go to local ER which she acknowledged. I will see her again in 2 weeks.     [name], M.D.     STA/MEDQ  D:  12/06/2010  T:  12/06/2010  Job:  [id number]  Electronically Signed by Kathryne Sharper M.D. on 12/08/2010 09:27:18 AM

## 2010-12-10 ENCOUNTER — Other Ambulatory Visit: Payer: Self-pay | Admitting: Internal Medicine

## 2010-12-10 ENCOUNTER — Ambulatory Visit (INDEPENDENT_AMBULATORY_CARE_PROVIDER_SITE_OTHER): Payer: Self-pay | Admitting: Internal Medicine

## 2010-12-10 ENCOUNTER — Encounter: Payer: Self-pay | Admitting: Internal Medicine

## 2010-12-10 DIAGNOSIS — E119 Type 2 diabetes mellitus without complications: Secondary | ICD-10-CM | POA: Insufficient documentation

## 2010-12-10 DIAGNOSIS — E669 Obesity, unspecified: Secondary | ICD-10-CM | POA: Insufficient documentation

## 2010-12-10 DIAGNOSIS — R209 Unspecified disturbances of skin sensation: Secondary | ICD-10-CM

## 2010-12-10 DIAGNOSIS — Z13220 Encounter for screening for lipoid disorders: Secondary | ICD-10-CM

## 2010-12-10 DIAGNOSIS — Z1231 Encounter for screening mammogram for malignant neoplasm of breast: Secondary | ICD-10-CM

## 2010-12-10 DIAGNOSIS — R739 Hyperglycemia, unspecified: Secondary | ICD-10-CM

## 2010-12-10 DIAGNOSIS — R600 Localized edema: Secondary | ICD-10-CM | POA: Insufficient documentation

## 2010-12-10 DIAGNOSIS — Z23 Encounter for immunization: Secondary | ICD-10-CM

## 2010-12-10 DIAGNOSIS — G8929 Other chronic pain: Secondary | ICD-10-CM | POA: Insufficient documentation

## 2010-12-10 DIAGNOSIS — R635 Abnormal weight gain: Secondary | ICD-10-CM

## 2010-12-10 DIAGNOSIS — R609 Edema, unspecified: Secondary | ICD-10-CM

## 2010-12-10 DIAGNOSIS — Z124 Encounter for screening for malignant neoplasm of cervix: Secondary | ICD-10-CM

## 2010-12-10 DIAGNOSIS — R102 Pelvic and perineal pain: Secondary | ICD-10-CM | POA: Insufficient documentation

## 2010-12-10 DIAGNOSIS — R109 Unspecified abdominal pain: Secondary | ICD-10-CM

## 2010-12-10 DIAGNOSIS — R202 Paresthesia of skin: Secondary | ICD-10-CM

## 2010-12-10 DIAGNOSIS — R7309 Other abnormal glucose: Secondary | ICD-10-CM

## 2010-12-10 LAB — LIPID PANEL
Cholesterol: 170 mg/dL (ref 0–200)
HDL: 48.6 mg/dL (ref >39.00)

## 2010-12-10 LAB — POCT URINALYSIS DIPSTICK
Glucose, UA: NEGATIVE
Ketones, UA: NEGATIVE
Leukocytes, UA: NEGATIVE
Nitrite, UA: NEGATIVE

## 2010-12-10 LAB — HEMOGLOBIN A1C: Hgb A1c MFr Bld: 5.4 % (ref 4.6–6.5)

## 2010-12-10 LAB — VITAMIN B12: Vitamin B-12: 396 pg/mL (ref 211–911)

## 2010-12-10 MED ORDER — FUROSEMIDE 20 MG PO TABS: ORAL_TABLET | ORAL | Status: AC

## 2010-12-10 NOTE — Assessment & Plan Note (Signed)
Dietary modification, weight loss and exercise recommended. Obtain A1c

## 2010-12-10 NOTE — Assessment & Plan Note (Signed)
Myofascial pain possibly fibromyalgia. Discussed relationship to depression and insomnia. Agreeable to weight loss and exercise.

## 2010-12-10 NOTE — Assessment & Plan Note (Signed)
No evidence of clinical volume overload. Attempt Lasix when necessary.

## 2010-12-10 NOTE — Assessment & Plan Note (Signed)
Obtain B12 level however suspect possible contribution from lumbar spine. Discussed followup with back specialist but patient currently defers. No focal weakness.

## 2010-12-10 NOTE — Progress Notes (Signed)
  Subjective:    Patient ID: [name], female    DOB: December 26, 1970, 40 y.o.   MRN: [id number]  HPI patient presents to clinic  to establish primary medical care. Notes 2 week history of lower extremity swelling without associated shortness of breath. Swelling waxes and wanes and has no chest pain or risks for DVT. Complains of recent onset of suprapubic discomfort without hematuria fever chills or dysuria. Has chronic diffuse myofascial pain located in multiple tender points. Has been told in the past may have fibromyalgia. Also has chronic intermittent bilateral anterior leg paresthesias with history of lumbar back surgery in the past. After surgery underwent steroid epidural injections. Denies focal weakness. Has been seen by Dr. Lovell Sheehan of  [facility] in the past. Had recent hospitalization for cholecystitis resulting in an uncomplicated cholecystectomy. Chest x-ray unremarkable time. Had CBC, Chem-7 with mildly elevated glucoses, d-dimer lipase normal. Has history of melanoma followed by dermatology for surveillance with no evidence recurrence. Last Pap smear October 2010 with history of abnormal Paps. Request gynecology referral. Recalls history of severe anemia in the past status post hematology evaluation. Has not had screening mammogram but is willing to have this scheduled. His difficulty with weight gain and is taking phentermine from a weight loss clinic. History of depression currently followed by psychiatry and appear stable. No other complaints.  Reviewed past medical history, surgical history, medications, allergies, social history and family history    Review of Systems  Constitutional: Positive for unexpected weight change. Negative for fever and chills.  HENT: Negative for facial swelling.   Respiratory: Negative for shortness of breath.   Cardiovascular: Positive for leg swelling. Negative for chest pain.  Genitourinary: Negative for dysuria, hematuria and difficulty urinating.    Neurological: Positive for numbness.  Psychiatric/Behavioral: Negative for behavioral problems and agitation.  All other systems reviewed and are negative.       Objective:   Physical Exam    Physical Exam  [nursing notereviewed. Constitutional:  appears well-developed and well-nourished. No distress.  HENT:  Head: Normocephalic and atraumatic.  Right Ear: Tympanic membrane, external ear and ear canal normal.  Left Ear: Tympanic membrane, external ear and ear canal normal.  Nose: Nose normal.  Mouth/Throat: Oropharynx is clear and moist. No oropharyngeal exudate.  Eyes: Conjunctivae are normal. No scleral icterus.  Neck: Neck supple.  Cardiovascular: Normal rate, regular rhythm and normal heart sounds.  Exam reveals no gallop and no friction rub.   No murmur heard. Pulmonary/Chest: Effort normal and breath sounds normal. No respiratory distress.  no wheezes.  no rales.  Lymphadenopathy:     no cervical adenopathy.  Neurological:  alert.  Skin: Skin is warm and dry.  not diaphoretic. +1 bilateral lower extremity edema. No palpable cords. Mildly prominent venous varicosities    Assessment & Plan:

## 2010-12-10 NOTE — Assessment & Plan Note (Signed)
Obtain urinalysis 

## 2010-12-10 NOTE — Assessment & Plan Note (Signed)
Maintained on phentermine followed by weight loss clinic. Obtain TSH.

## 2010-12-10 NOTE — Progress Notes (Signed)
patient  Is aware 

## 2010-12-15 ENCOUNTER — Telehealth: Payer: Self-pay

## 2010-12-15 NOTE — Telephone Encounter (Signed)
Pt notified labs nl 

## 2010-12-15 NOTE — Telephone Encounter (Signed)
Message copied by Beverely Low on Wed Dec 15, 2010  1:33 PM ------      Message from: Staci Righter      Created: Tue Dec 14, 2010 10:25 PM       Labs nl

## 2010-12-22 ENCOUNTER — Ambulatory Visit (HOSPITAL_COMMUNITY): Payer: Self-pay

## 2010-12-23 ENCOUNTER — Encounter (HOSPITAL_BASED_OUTPATIENT_CLINIC_OR_DEPARTMENT_OTHER): Payer: Self-pay | Admitting: Marriage and Family Therapist

## 2010-12-23 DIAGNOSIS — F39 Unspecified mood [affective] disorder: Secondary | ICD-10-CM

## 2010-12-24 ENCOUNTER — Telehealth: Payer: Self-pay | Admitting: *Deleted

## 2010-12-24 ENCOUNTER — Encounter (HOSPITAL_COMMUNITY): Payer: Self-pay | Admitting: Psychiatry

## 2010-12-24 ENCOUNTER — Telehealth: Payer: Self-pay | Admitting: Internal Medicine

## 2010-12-24 DIAGNOSIS — F3189 Other bipolar disorder: Secondary | ICD-10-CM

## 2010-12-24 NOTE — Telephone Encounter (Signed)
Pt. Would like a referral to a neurologist within the [facility] for back pain.

## 2010-12-24 NOTE — Telephone Encounter (Signed)
Samples of Nexium left at front desk.  Patient notified.

## 2010-12-27 ENCOUNTER — Other Ambulatory Visit: Payer: Self-pay | Admitting: Internal Medicine

## 2010-12-27 DIAGNOSIS — G8929 Other chronic pain: Secondary | ICD-10-CM

## 2010-12-27 NOTE — Telephone Encounter (Signed)
Referral order placed.

## 2010-12-28 NOTE — Telephone Encounter (Signed)
Could not reach pt on cell phone, so Mom was called and notified.

## 2010-12-30 ENCOUNTER — Encounter (HOSPITAL_BASED_OUTPATIENT_CLINIC_OR_DEPARTMENT_OTHER): Payer: Self-pay | Admitting: Marriage and Family Therapist

## 2010-12-30 DIAGNOSIS — F39 Unspecified mood [affective] disorder: Secondary | ICD-10-CM

## 2010-12-31 ENCOUNTER — Ambulatory Visit (HOSPITAL_COMMUNITY): Payer: Self-pay

## 2011-01-06 ENCOUNTER — Encounter (HOSPITAL_BASED_OUTPATIENT_CLINIC_OR_DEPARTMENT_OTHER): Payer: Self-pay | Admitting: Marriage and Family Therapist

## 2011-01-06 DIAGNOSIS — F339 Major depressive disorder, recurrent, unspecified: Secondary | ICD-10-CM

## 2011-01-07 ENCOUNTER — Ambulatory Visit (HOSPITAL_COMMUNITY)
Admission: RE | Admit: 2011-01-07 | Discharge: 2011-01-07 | Disposition: A | Discharge status: Home / Self Care | Payer: Self-pay | Source: Ambulatory Visit | Attending: Internal Medicine | Admitting: Internal Medicine

## 2011-01-07 DIAGNOSIS — Z1231 Encounter for screening mammogram for malignant neoplasm of breast: Secondary | ICD-10-CM | POA: Insufficient documentation

## 2011-01-10 ENCOUNTER — Ambulatory Visit: Payer: Self-pay | Admitting: Internal Medicine

## 2011-01-13 ENCOUNTER — Encounter (HOSPITAL_BASED_OUTPATIENT_CLINIC_OR_DEPARTMENT_OTHER): Payer: Self-pay | Admitting: Marriage and Family Therapist

## 2011-01-13 DIAGNOSIS — F39 Unspecified mood [affective] disorder: Secondary | ICD-10-CM

## 2011-01-17 ENCOUNTER — Ambulatory Visit: Payer: Self-pay | Admitting: Internal Medicine

## 2011-01-17 ENCOUNTER — Encounter (HOSPITAL_COMMUNITY): Payer: Self-pay | Admitting: Psychiatry

## 2011-01-17 DIAGNOSIS — F3189 Other bipolar disorder: Secondary | ICD-10-CM

## 2011-01-20 ENCOUNTER — Encounter (HOSPITAL_COMMUNITY): Payer: Self-pay | Admitting: Marriage and Family Therapist

## 2011-01-27 ENCOUNTER — Encounter (HOSPITAL_COMMUNITY): Payer: Self-pay | Admitting: Marriage and Family Therapist

## 2011-02-09 ENCOUNTER — Telehealth: Payer: Self-pay | Admitting: Family Medicine

## 2011-02-09 ENCOUNTER — Other Ambulatory Visit: Payer: Self-pay | Admitting: Hematology and Oncology

## 2011-02-09 ENCOUNTER — Encounter (HOSPITAL_BASED_OUTPATIENT_CLINIC_OR_DEPARTMENT_OTHER): Payer: Self-pay | Admitting: Hematology and Oncology

## 2011-02-09 ENCOUNTER — Other Ambulatory Visit: Payer: Self-pay | Admitting: Internal Medicine

## 2011-02-09 DIAGNOSIS — C4359 Malignant melanoma of other part of trunk: Secondary | ICD-10-CM

## 2011-02-09 DIAGNOSIS — D509 Iron deficiency anemia, unspecified: Secondary | ICD-10-CM

## 2011-02-09 LAB — CBC WITH DIFFERENTIAL/PLATELET
Basophils Absolute: 0 10*3/uL (ref 0.0–0.1)
EOS%: 0.9 % (ref 0.0–7.0)
Eosinophils Absolute: 0.1 10*3/uL (ref 0.0–0.5)
HGB: 13.4 g/dL (ref 11.6–15.9)
LYMPH%: 15.1 % (ref 14.0–49.7)
MCH: 28.2 pg (ref 25.1–34.0)
MCV: 86.3 fL (ref 79.5–101.0)
MONO%: 7.9 % (ref 0.0–14.0)
NEUT#: 8.6 10*3/uL — ABNORMAL HIGH (ref 1.5–6.5)
NEUT%: 75.9 % (ref 38.4–76.8)
Platelets: 257 10*3/uL (ref 145–400)
RDW: 14.2 % (ref 11.2–14.5)

## 2011-02-09 LAB — COMPREHENSIVE METABOLIC PANEL
AST: 7 U/L (ref 0–37)
Albumin: 4.1 g/dL (ref 3.5–5.2)
Alkaline Phosphatase: 102 U/L (ref 39–117)
BUN: 8 mg/dL (ref 6–23)
Creatinine, Ser: 0.89 mg/dL (ref 0.50–1.10)
Glucose, Bld: 113 mg/dL — ABNORMAL HIGH (ref 70–99)
Potassium: 4.1 mEq/L (ref 3.5–5.3)
Total Bilirubin: 0.5 mg/dL (ref 0.3–1.2)

## 2011-02-09 LAB — IRON AND TIBC
TIBC: 271 ug/dL (ref 250–470)
UIBC: 200 ug/dL

## 2011-02-09 LAB — FERRITIN: Ferritin: 51 ng/mL (ref 10–291)

## 2011-02-09 NOTE — Telephone Encounter (Signed)
Pt was a pt of Dr Ty Hilts, and pt does not want to transfer to [facility]. Pt said that she has an order from Dr Lolly Mustache at [facility], for pt to get Lithium Lvl checked. Pt is req to est with you and is req to get lithium lvl checked at [facility] lab. Pls advise.

## 2011-02-09 NOTE — Telephone Encounter (Signed)
We do not draw labs here for outside doctors because it has to be done in our names. She would need to have this drawn at [facility]

## 2011-02-09 NOTE — Telephone Encounter (Signed)
Samples of Nexium left at front desk and patient notified (left message on vmail).    Samples given to patient      Lot#   [id number]               Exp.  10/2013                Amount 4

## 2011-02-10 NOTE — Telephone Encounter (Signed)
Pt has been notified that outside labs can not be drawn here, and that pt would need to go to [facility] lab as noted.

## 2011-02-11 ENCOUNTER — Encounter: Payer: Self-pay | Admitting: Advanced Practice Midwife

## 2011-02-16 ENCOUNTER — Encounter (HOSPITAL_BASED_OUTPATIENT_CLINIC_OR_DEPARTMENT_OTHER): Payer: Self-pay | Admitting: Hematology and Oncology

## 2011-02-16 ENCOUNTER — Encounter (HOSPITAL_COMMUNITY): Payer: Self-pay | Admitting: Psychiatry

## 2011-02-16 DIAGNOSIS — D509 Iron deficiency anemia, unspecified: Secondary | ICD-10-CM

## 2011-02-16 DIAGNOSIS — N920 Excessive and frequent menstruation with regular cycle: Secondary | ICD-10-CM

## 2011-02-23 ENCOUNTER — Encounter (HOSPITAL_BASED_OUTPATIENT_CLINIC_OR_DEPARTMENT_OTHER): Payer: Self-pay | Admitting: Hematology and Oncology

## 2011-02-23 DIAGNOSIS — D509 Iron deficiency anemia, unspecified: Secondary | ICD-10-CM

## 2011-02-28 ENCOUNTER — Encounter (HOSPITAL_COMMUNITY): Payer: Self-pay | Admitting: Psychiatry

## 2011-03-02 ENCOUNTER — Encounter (HOSPITAL_COMMUNITY): Payer: Self-pay | Admitting: Psychiatry

## 2011-03-07 ENCOUNTER — Encounter (INDEPENDENT_AMBULATORY_CARE_PROVIDER_SITE_OTHER): Payer: Self-pay | Admitting: Marriage and Family Therapist

## 2011-03-07 DIAGNOSIS — F39 Unspecified mood [affective] disorder: Secondary | ICD-10-CM

## 2011-03-09 ENCOUNTER — Other Ambulatory Visit: Payer: Self-pay | Admitting: Internal Medicine

## 2011-03-10 ENCOUNTER — Ambulatory Visit (INDEPENDENT_AMBULATORY_CARE_PROVIDER_SITE_OTHER): Payer: Self-pay | Admitting: Family Medicine

## 2011-03-10 ENCOUNTER — Encounter: Payer: Self-pay | Admitting: Family Medicine

## 2011-03-10 VITALS — BP 118/80 | Temp 98.5°F | Wt 294.0 lb

## 2011-03-10 DIAGNOSIS — R202 Paresthesia of skin: Secondary | ICD-10-CM

## 2011-03-10 DIAGNOSIS — R209 Unspecified disturbances of skin sensation: Secondary | ICD-10-CM

## 2011-03-10 NOTE — Progress Notes (Signed)
  Subjective:    Patient ID: [name], female    DOB: 02/19/1971, 40 y.o.   MRN: [id number]  HPI [name] is a 40 year old female patient of Dr. Rodena Medin who comes in today with a list of issues.  Her concerns are excessive sweating, sensitivity to heat, weakness, neck pain, back pain, swelling, and tingling in her feet, burning sensation in her thighs, bilateral shoulder pain, left eye pain, daily headaches, right ear pain, trouble sleeping, diarrhea, since she had her gallbladder removed, memory loss, slurred speech, vomiting, and her sleep, history of reflux esophagitis, no flexibility, depression, shortness of breath, racing thoughts.  She said she had a good relationship with Dr. Rodena Medin and I recommend she continue to see him in [facility] for follow-up.  She states that Dr. Irving Burton and was going to get her set up to see a neurologist for evaluation of her symptoms.  Her concern is that she might have ALS   Review of Systems Positive review of systems    Objective:   Physical Exam  Obese female, in no acute distress.  Weight 294 pounds      Assessment & Plan:  I discussed with [name] that my background.  His family practice, and she might want to consider continuing with Dr. Rodena Medin .  Per her request.  I will also recommend a neurologic consultation

## 2011-03-10 NOTE — Patient Instructions (Signed)
I will put in a request for neurology consult.  I would strongly consider continuing your care with Dr. Rodena Medin

## 2011-03-14 NOTE — Telephone Encounter (Signed)
Samples left at front desk. L/M on v-mail for pt. To pick up

## 2011-03-17 ENCOUNTER — Ambulatory Visit (INDEPENDENT_AMBULATORY_CARE_PROVIDER_SITE_OTHER): Payer: Self-pay | Admitting: Internal Medicine

## 2011-03-17 ENCOUNTER — Encounter: Payer: Self-pay | Admitting: Internal Medicine

## 2011-03-17 ENCOUNTER — Encounter (HOSPITAL_COMMUNITY): Payer: Self-pay | Admitting: Marriage and Family Therapist

## 2011-03-17 DIAGNOSIS — IMO0001 Reserved for inherently not codable concepts without codable children: Secondary | ICD-10-CM

## 2011-03-17 DIAGNOSIS — R030 Elevated blood-pressure reading, without diagnosis of hypertension: Secondary | ICD-10-CM

## 2011-03-17 DIAGNOSIS — K219 Gastro-esophageal reflux disease without esophagitis: Secondary | ICD-10-CM

## 2011-03-17 DIAGNOSIS — M5412 Radiculopathy, cervical region: Secondary | ICD-10-CM

## 2011-03-17 MED ORDER — GABAPENTIN 300 MG PO CAPS: ORAL_CAPSULE | ORAL | Status: DC

## 2011-03-17 MED ORDER — METHYLPREDNISOLONE (PAK) 4 MG PO TABS: ORAL_TABLET | ORAL | Status: AC

## 2011-03-20 DIAGNOSIS — IMO0001 Reserved for inherently not codable concepts without codable children: Secondary | ICD-10-CM | POA: Insufficient documentation

## 2011-03-20 DIAGNOSIS — M5412 Radiculopathy, cervical region: Secondary | ICD-10-CM | POA: Insufficient documentation

## 2011-03-20 NOTE — Assessment & Plan Note (Signed)
Attempt steroid taper. Begin neurontin. Neurology appt pending

## 2011-03-20 NOTE — Progress Notes (Signed)
  Subjective:    Patient ID: [name], female    DOB: Sep 20, 1970, 40 y.o.   MRN: [id number]  HPI Pt presents to clinic for followup of multiple medical problems. Notes right lateral neck pain that radiates to the right upper arm. Has associated right 1-2 finger pain that appears neuropathic. H/o back surgery in the past. Has neurology appt scheduled in near future. BP elevated but typically normotensive. H/o GERD taking ppi qd. +nocturnal emesis. Has GI appt in near future.  Past Medical History  Diagnosis Date  . Anemia   . Anxiety disorder   . Depression   . Fibromyalgia   . Obesity   . Melanoma     right hip and knee  . Chicken pox   . Allergy   . Migraine   . UTI (lower urinary tract infection)    Past Surgical History  Procedure Date  . Back surgery   . Cholecystectomy   . Wisdom tooth extraction 1989  . Melanoma excision 2011    stage I and II    reports that she has never smoked. She has never used smokeless tobacco. She reports that she does not drink alcohol or use illicit drugs. family history includes Alcohol abuse in her other; Arthritis in her other; Cancer in her other; Colitis in her father; Diabetes in an unspecified family member; Heart attack (age of onset:46) in her father; Heart disease in her father; Hyperlipidemia in her other; Irritable bowel syndrome in an unspecified family member; Kidney disease in an unspecified family member; and Mental illness in her other.  There is no history of Colon cancer. Allergies  Allergen Reactions  . Carbamazepine      Review of Systems see hpi     Objective:   Physical Exam  Nursing note and vitals reviewed. Constitutional: She appears well-developed and well-nourished. No distress.  HENT:  Head: Normocephalic and atraumatic.  Eyes: Conjunctivae are normal. No scleral icterus.  Neck: Neck supple.  Musculoskeletal:       FROM of neck and right extremity. Distal right hand strength 5/5.   Skin: She is not  diaphoretic.          Assessment & Plan:

## 2011-03-20 NOTE — Assessment & Plan Note (Signed)
Isolated elevation. Maintain outpt bp log.

## 2011-03-20 NOTE — Assessment & Plan Note (Signed)
Increase ppi to bid until gi appt.

## 2011-03-24 ENCOUNTER — Encounter (HOSPITAL_COMMUNITY): Payer: Self-pay | Admitting: Marriage and Family Therapist

## 2011-03-28 ENCOUNTER — Encounter: Payer: Self-pay | Admitting: Internal Medicine

## 2011-03-28 ENCOUNTER — Ambulatory Visit (INDEPENDENT_AMBULATORY_CARE_PROVIDER_SITE_OTHER): Payer: Self-pay | Admitting: Internal Medicine

## 2011-03-28 VITALS — BP 120/90 | HR 88 | Ht 69.0 in | Wt 295.0 lb

## 2011-03-28 DIAGNOSIS — K219 Gastro-esophageal reflux disease without esophagitis: Secondary | ICD-10-CM

## 2011-03-28 NOTE — Progress Notes (Signed)
HISTORY OF PRESENT ILLNESS:  [name] is a 40 y.o. female with anxiety disorder, bipolar disorder with depressive tendencies, fibromyalgia, morbid obesity, fatty liver disease, and GERD. She was last seen in the office 11/23/2010 regarding GERD and fatty liver. See that dictation. He presents today with ongoing complaints of reflux. She has been on PPI therapy erratically, relying on office samples. Takes ranitidine but she is out of PPI. Recently had significant symptoms, saw her PCP and was on twice a day PPI. Somewhat better. No dysphagia. Unfortunately, she has had no weight loss. Actually some weight gain. No new complaints  REVIEW OF SYSTEMS:  All non-GI ROS negative except for joint aches, neck and back pain  Past Medical History  Diagnosis Date  . Anemia   . Anxiety disorder   . Depression   . Fibromyalgia   . Obesity   . Melanoma     right hip and knee  . Chicken pox   . Allergy   . Migraine   . UTI (lower urinary tract infection)     Past Surgical History  Procedure Date  . Back surgery   . Cholecystectomy   . Wisdom tooth extraction 1989  . Melanoma excision 2011    stage I and II    Social History [name]  reports that she has never smoked. She has never used smokeless tobacco. She reports that she does not drink alcohol or use illicit drugs.  family history includes Alcohol abuse in her other; Arthritis in her other; Cancer in her other; Colitis in her father; Diabetes in an unspecified family member; Heart attack (age of onset:46) in her father; Heart disease in her father; Hyperlipidemia in her other; Irritable bowel syndrome in an unspecified family member; Kidney disease in an unspecified family member; and Mental illness in her other.  There is no history of Colon cancer.  Allergies  Allergen Reactions  . Carbamazepine        PHYSICAL EXAMINATION:  Vital signs: BP 120/90  Pulse 88  Ht 5\' 9"  (1.753 m)  Wt 295 lb (133.811 kg)  BMI 43.56  kg/m2  LMP 03/22/2011 General: Well-developed, well-nourished, no acute distress. Morbidly obese HEENT: Sclerae are anicteric, conjunctiva pink. Oral mucosa intact Abdomen: Not reexamined Psychiatric: alert and oriented x3. Cooperative    ASSESSMENT:  #1. GERD #2. Morbid obesity #3. Fatty liver   PLAN:  #1. Reflux precautions with attention to weight loss #2. Samples of PPI provided #3. Exercise and weight loss to address issue of fatty liver #4. GI followup when necessary

## 2011-03-28 NOTE — Patient Instructions (Signed)
Nexium samples given to you.  Samples given to patient    Lot# [id number]                  Exp.  12/2013                Amount   6

## 2011-03-31 ENCOUNTER — Encounter (HOSPITAL_COMMUNITY): Payer: Self-pay | Admitting: Marriage and Family Therapist

## 2011-04-01 ENCOUNTER — Emergency Department (HOSPITAL_BASED_OUTPATIENT_CLINIC_OR_DEPARTMENT_OTHER)
Admission: EM | Admit: 2011-04-01 | Discharge: 2011-04-01 | Disposition: A | Discharge status: Home / Self Care | Payer: Self-pay | Attending: Emergency Medicine | Admitting: Emergency Medicine

## 2011-04-01 ENCOUNTER — Encounter (HOSPITAL_BASED_OUTPATIENT_CLINIC_OR_DEPARTMENT_OTHER): Payer: Self-pay | Admitting: *Deleted

## 2011-04-01 DIAGNOSIS — IMO0001 Reserved for inherently not codable concepts without codable children: Secondary | ICD-10-CM | POA: Insufficient documentation

## 2011-04-01 DIAGNOSIS — K219 Gastro-esophageal reflux disease without esophagitis: Secondary | ICD-10-CM | POA: Insufficient documentation

## 2011-04-01 DIAGNOSIS — F341 Dysthymic disorder: Secondary | ICD-10-CM | POA: Insufficient documentation

## 2011-04-01 DIAGNOSIS — E669 Obesity, unspecified: Secondary | ICD-10-CM | POA: Insufficient documentation

## 2011-04-01 DIAGNOSIS — R51 Headache: Secondary | ICD-10-CM | POA: Insufficient documentation

## 2011-04-01 HISTORY — DX: Gastro-esophageal reflux disease without esophagitis: K21.9

## 2011-04-01 NOTE — ED Notes (Signed)
Per rounding note at 11:27, the delay in seeing the EDP was explained to the pt and she verbalized understanding. At 11:40, pt was observed leaving the ER and she stated to registration personnel that "she had been here 2 hours and she hasn't seen anyone yet." Pt ambulated out of the ER unassisted with a brisk, steady gait.

## 2011-04-01 NOTE — ED Notes (Signed)
Pt c/o HA that begins in the back and spreads to the right side. Pt sts pain has been present for 2 weeks and became worse at 08:30 this morning. Pt also c/o N/V. Pt sts her right eye was drooping this am but is not drooping in triage.

## 2011-04-05 ENCOUNTER — Ambulatory Visit (HOSPITAL_COMMUNITY)
Admission: RE | Admit: 2011-04-05 | Discharge: 2011-04-05 | Disposition: A | Discharge status: Home / Self Care | Payer: Self-pay | Source: Ambulatory Visit | Attending: Diagnostic Neuroimaging | Admitting: Diagnostic Neuroimaging

## 2011-04-05 ENCOUNTER — Other Ambulatory Visit (HOSPITAL_COMMUNITY): Payer: Self-pay | Admitting: Diagnostic Neuroimaging

## 2011-04-05 ENCOUNTER — Other Ambulatory Visit (HOSPITAL_COMMUNITY): Payer: Self-pay | Admitting: Neurology

## 2011-04-05 DIAGNOSIS — R519 Headache, unspecified: Secondary | ICD-10-CM

## 2011-04-05 DIAGNOSIS — R51 Headache: Secondary | ICD-10-CM | POA: Insufficient documentation

## 2011-04-05 DIAGNOSIS — G1221 Amyotrophic lateral sclerosis: Secondary | ICD-10-CM

## 2011-04-05 DIAGNOSIS — D499 Neoplasm of unspecified behavior of unspecified site: Secondary | ICD-10-CM

## 2011-04-05 DIAGNOSIS — Z85820 Personal history of malignant melanoma of skin: Secondary | ICD-10-CM | POA: Insufficient documentation

## 2011-04-05 NOTE — ED Provider Notes (Addendum)
History     CSN: [id number] Arrival date & time: 04/01/2011  9:51 AM   Chief Complaint  Patient presents with  . Headache     (Include location/radiation/quality/duration/timing/severity/associated sxs/prior treatment) The history is provided by the patient.   Pt c/o HA that begins in the back and spreads to the right side. Pt sts pain has been present for 2 weeks and became worse at 08:30 this morning. Pt also c/o N/V. Pt sts her right eye was drooping this am but is not drooping in triage.   Past Medical History  Diagnosis Date  . Anemia   . Anxiety disorder   . Depression   . Fibromyalgia   . Obesity   . Melanoma     right hip and knee  . Chicken pox   . Allergy   . Migraine   . UTI (lower urinary tract infection)   . GERD (gastroesophageal reflux disease)      Past Surgical History  Procedure Date  . Back surgery   . Cholecystectomy   . Wisdom tooth extraction 1989  . Melanoma excision 2011    stage I and II    Family History  Problem Relation Age of Onset  . Colon cancer Neg Hx   . Colitis Father     crohn's  . Heart disease Father     grandfather  . Heart attack Father 57  . Diabetes      grandfather  . Irritable bowel syndrome    . Kidney disease      grandfather  . Alcohol abuse Other   . Arthritis Other   . Cancer Other     lung  . Hyperlipidemia Other   . Mental illness Other     History  Substance Use Topics  . Smoking status: Never Smoker   . Smokeless tobacco: Never Used  . Alcohol Use: No    OB History    Grav Para Term Preterm Abortions TAB SAB Ect Mult Living                  Review of Systems  Constitutional: Negative for fever and chills.  HENT: Negative for neck pain.   Eyes: Negative for photophobia.  Respiratory: Negative for cough and shortness of breath.   Cardiovascular: Negative for chest pain.  Gastrointestinal: Negative for abdominal pain.  Genitourinary: Negative for dysuria.  Musculoskeletal: Negative  for back pain.  Neurological: Positive for headaches. Negative for weakness.  Psychiatric/Behavioral: Negative for confusion.    Allergies  Carbamazepine  Home Medications   Current Outpatient Rx  Name Route Sig Dispense Refill  . CALCIUM CARBONATE ANTACID 500 MG PO CHEW Oral Chew 5 tablets by mouth daily.      Marland Kitchen ESOMEPRAZOLE MAGNESIUM 40 MG PO CPDR Oral Take 1 capsule (40 mg total) by mouth daily. 30 capsule 1    Samples given to patient    Lot#  [id number]          ...  . GABAPENTIN 300 MG PO CAPS  One po bid x 4 days then one po bid 60 capsule 2    Physical Exam    BP 135/78  Pulse 81  Temp(Src) 98.4 F (36.9 C) (Oral)  Resp 20  Ht 5\' 8"  (1.727 m)  Wt 295 lb (133.811 kg)  BMI 44.85 kg/m2  SpO2 100%  LMP 03/22/2011  Physical Exam  Constitutional: She is oriented to person, place, and time. She appears well-developed and well-nourished.  HENT:  Head: Normocephalic  and atraumatic.  Eyes: Pupils are equal, round, and reactive to light.  Neck: Normal range of motion. Neck supple.       No nuchal rigidity  Cardiovascular: Normal rate, regular rhythm and normal heart sounds.   No murmur heard. Pulmonary/Chest: Effort normal and breath sounds normal. No respiratory distress. She has no wheezes. She has no rales.  Abdominal: Soft. She exhibits no distension and no mass. There is no tenderness. There is no rebound and no guarding.  Musculoskeletal: Normal range of motion. She exhibits no edema and no tenderness.  Neurological: She is alert and oriented to person, place, and time. No cranial nerve deficit. Coordination normal.  Skin: Skin is warm and dry. No rash noted. No erythema.  Psychiatric: She has a normal mood and affect. Her behavior is normal.    ED Course  Procedures  Results for orders placed in visit on 02/09/11  COMPREHENSIVE METABOLIC PANEL      Component Value Range   Sodium 138  135 - 145 (mEq/L)   Potassium 4.1  3.5 - 5.3 (mEq/L)   Chloride 102  96 - 112  (mEq/L)   CO2 24  19 - 32 (mEq/L)   Glucose, Bld 113 (*) 70 - 99 (mg/dL)   BUN 8  6 - 23 (mg/dL)   Creatinine, Ser 2.72  0.50 - 1.10 (mg/dL)   Total Bilirubin 0.5  0.3 - 1.2 (mg/dL)   Alkaline Phosphatase 102  39 - 117 (U/L)   AST 7  0 - 37 (U/L)   ALT 15  0 - 35 (U/L)   Total Protein 6.7  6.0 - 8.3 (g/dL)   Albumin 4.1  3.5 - 5.2 (g/dL)   Calcium 9.6  8.4 - 53.6 (mg/dL)  IRON AND TIBC      Component Value Range   Iron 71  42 - 145 (ug/dL)   UIBC 644     TIBC 034  250 - 470 (ug/dL)   %SAT 26  20 - 55 (%)  COMPREHENSIVE METABOLIC PANEL      Component Value Range   Sodium 138  135 - 145 (mEq/L)   Potassium 4.1  3.5 - 5.3 (mEq/L)   Chloride 102  96 - 112 (mEq/L)   CO2 24  19 - 32 (mEq/L)   Glucose, Bld 113 (*) 70 - 99 (mg/dL)   BUN 8  6 - 23 (mg/dL)   Creatinine, Ser 7.42  0.50 - 1.10 (mg/dL)   Total Bilirubin 0.5  0.3 - 1.2 (mg/dL)   Alkaline Phosphatase 102  39 - 117 (U/L)   AST 7  0 - 37 (U/L)   ALT 15  0 - 35 (U/L)   Total Protein 6.7  6.0 - 8.3 (g/dL)   Albumin 4.1  3.5 - 5.2 (g/dL)   Calcium 9.6  8.4 - 59.5 (mg/dL)  IRON AND TIBC      Component Value Range   Iron 71  42 - 145 (ug/dL)   UIBC 638     TIBC 756  250 - 470 (ug/dL)   %SAT 26  20 - 55 (%)  FERRITIN      Component Value Range   Ferritin 51  10 - 291 (ng/mL)   Ct Head Wo Contrast  04/05/2011  *RADIOLOGY REPORT*  Clinical Data: History of melanoma.  Severe headaches.  CT HEAD WITHOUT CONTRAST  Technique:  Contiguous axial images were obtained from the base of the skull through the vertex without contrast.  Comparison: 08/13/2010.  Findings:  There is no evidence for acute infarction, intracranial hemorrhage, mass lesion, hydrocephalus, or extra-axial fluid. There is no atrophy or white matter disease.  Calvarium is intact. There is no acute sinus or mastoid fluid.  IMPRESSION: Normal exam.  No change from priors.  Original Report Authenticated By: Elsie Stain, M.D.     1. Headache       MDM Headache       Nicholes Stairs, MD 04/05/11 1610  Nicholes Stairs, MD 04/05/11 2103

## 2011-04-11 ENCOUNTER — Ambulatory Visit (HOSPITAL_COMMUNITY)
Admission: RE | Admit: 2011-04-11 | Discharge: 2011-04-11 | Disposition: A | Discharge status: Home / Self Care | Payer: No Typology Code available for payment source | Source: Ambulatory Visit | Attending: Neurology | Admitting: Neurology

## 2011-04-11 DIAGNOSIS — D499 Neoplasm of unspecified behavior of unspecified site: Secondary | ICD-10-CM

## 2011-04-11 DIAGNOSIS — R519 Headache, unspecified: Secondary | ICD-10-CM

## 2011-04-11 DIAGNOSIS — G1221 Amyotrophic lateral sclerosis: Secondary | ICD-10-CM

## 2011-05-02 LAB — I-STAT 8, (EC8 V) (CONVERTED LAB)
BUN: 12
Bicarbonate: 26.2 — ABNORMAL HIGH
HCT: 39
Hemoglobin: 13.3
Operator id: 277751
Sodium: 140
TCO2: 27

## 2011-05-02 LAB — DIFFERENTIAL
Basophils Absolute: 0.1
Basophils Relative: 1
Eosinophils Absolute: 0.1
Eosinophils Relative: 1
Lymphs Abs: 3.1

## 2011-05-02 LAB — HEPATIC FUNCTION PANEL
Alkaline Phosphatase: 75
Total Bilirubin: 0.4
Total Protein: 7

## 2011-05-02 LAB — CBC
HCT: 35.1 — ABNORMAL LOW
MCHC: 31.9
MCV: 75.3 — ABNORMAL LOW
Platelets: 339
RDW: 16.8 — ABNORMAL HIGH

## 2011-05-02 LAB — LIPASE, BLOOD: Lipase: 30

## 2011-05-02 LAB — D-DIMER, QUANTITATIVE: D-Dimer, Quant: <0.22

## 2011-05-02 LAB — POCT CARDIAC MARKERS: Troponin i, poc: <0.05

## 2011-05-10 ENCOUNTER — Other Ambulatory Visit: Payer: Self-pay | Admitting: Hematology and Oncology

## 2011-05-10 ENCOUNTER — Encounter (HOSPITAL_BASED_OUTPATIENT_CLINIC_OR_DEPARTMENT_OTHER): Payer: Self-pay | Admitting: Hematology and Oncology

## 2011-05-10 DIAGNOSIS — N920 Excessive and frequent menstruation with regular cycle: Secondary | ICD-10-CM

## 2011-05-10 DIAGNOSIS — C4359 Malignant melanoma of other part of trunk: Secondary | ICD-10-CM

## 2011-05-10 DIAGNOSIS — D509 Iron deficiency anemia, unspecified: Secondary | ICD-10-CM

## 2011-05-10 LAB — CBC WITH DIFFERENTIAL/PLATELET
BASO%: 0.2 % (ref 0.0–2.0)
EOS%: 0.6 % (ref 0.0–7.0)
MCH: 30.1 pg (ref 25.1–34.0)
MCHC: 33.9 g/dL (ref 31.5–36.0)
RBC: 4.92 10*6/uL (ref 3.70–5.45)
RDW: 13.4 % (ref 11.2–14.5)
lymph#: 1.9 10*3/uL (ref 0.9–3.3)

## 2011-05-10 LAB — BASIC METABOLIC PANEL
Chloride: 101 mEq/L (ref 96–112)
Potassium: 4.1 mEq/L (ref 3.5–5.3)

## 2011-05-10 LAB — IRON AND TIBC
TIBC: 252 ug/dL (ref 250–470)
UIBC: 167 ug/dL (ref 125–400)

## 2011-05-10 LAB — FERRITIN: Ferritin: 153 ng/mL (ref 10–291)

## 2011-05-13 ENCOUNTER — Encounter (HOSPITAL_BASED_OUTPATIENT_CLINIC_OR_DEPARTMENT_OTHER): Payer: Self-pay | Admitting: Hematology and Oncology

## 2011-05-13 DIAGNOSIS — N920 Excessive and frequent menstruation with regular cycle: Secondary | ICD-10-CM

## 2011-05-13 DIAGNOSIS — D509 Iron deficiency anemia, unspecified: Secondary | ICD-10-CM

## 2011-05-16 ENCOUNTER — Telehealth: Payer: Self-pay | Admitting: *Deleted

## 2011-05-16 NOTE — Telephone Encounter (Signed)
As long as not having arm/leg weakness then medrol dosepak as directed #1 no rf.

## 2011-05-16 NOTE — Telephone Encounter (Signed)
Patient called and left voice message requesting a Rx for prednisone for her lower back pain. She stated her back went out yesterday, and she is having radiating pain down her legs. She is home today from work because she is having difficulty with movement. She would like to know if Dr Rodena Medin would provide a Rx for Prednisone to [employer] [facility] [employer]  She was asked about follow up on Neurology appt. She stated the MRI, was cancelled because she could not fit through the machine. She stated the neurology office was suppose to call her back to schedule a follow up and it has been about 3 weeks since she has heard anything from them. Patient stated that she wanted to make Dr Rodena Medin aware that she is not happy with the Neurologist that she was referred to.

## 2011-05-17 MED ORDER — PREDNISONE 10 MG PO TABS: ORAL_TABLET | ORAL | Status: DC

## 2011-05-17 NOTE — Telephone Encounter (Signed)
Call placed to patient at [phone], she was informed Rx for Prednisone sent to pharmacy.

## 2011-05-17 NOTE — Telephone Encounter (Signed)
Prednisone 40mg  po qd x 2 days then 30mg  po qd x 2days then 20mg  po qd x2 days then 10mg  po qd x 2 days.

## 2011-05-17 NOTE — Telephone Encounter (Signed)
Call placed to patient at [phone], patient stated that she had taken this medication at the last office visit, for a headache, and it did not help. She would like to know if Dr Rodena Medin could prescribe something else other than the dose pack.

## 2011-05-23 ENCOUNTER — Telehealth: Payer: Self-pay | Admitting: Internal Medicine

## 2011-05-23 NOTE — Telephone Encounter (Signed)
Pt notified we are currently out of samples.

## 2011-05-23 NOTE — Telephone Encounter (Signed)
nexium 40 mg  She would like some samples

## 2011-05-31 ENCOUNTER — Other Ambulatory Visit: Payer: Self-pay

## 2011-05-31 NOTE — Telephone Encounter (Signed)
Informed patient that we do not have any nexium samples at this time

## 2011-06-01 ENCOUNTER — Telehealth: Payer: Self-pay

## 2011-06-01 NOTE — Telephone Encounter (Signed)
Left message with patient that nexium samples had come in

## 2011-06-01 NOTE — Telephone Encounter (Signed)
Pt called back, told her nexium samples had arrived, she stated she is coming tomorrow to pick them up

## 2011-06-07 NOTE — Telephone Encounter (Signed)
PT GIVEN NEXIUM SAMPLES BY MA.

## 2011-07-19 HISTORY — PX: ABDOMINAL HYSTERECTOMY: SHX81

## 2011-07-28 ENCOUNTER — Telehealth: Payer: Self-pay | Admitting: Internal Medicine

## 2011-07-29 ENCOUNTER — Telehealth: Payer: Self-pay

## 2011-07-29 NOTE — Telephone Encounter (Signed)
Left message with patient that I had left Nexium refills up front per her request

## 2011-09-01 ENCOUNTER — Other Ambulatory Visit: Payer: Self-pay | Admitting: Internal Medicine

## 2011-09-01 NOTE — Telephone Encounter (Signed)
left message for pt to call

## 2011-09-08 NOTE — Telephone Encounter (Signed)
completed

## 2011-10-10 ENCOUNTER — Telehealth: Payer: Self-pay | Admitting: Internal Medicine

## 2011-10-12 ENCOUNTER — Telehealth: Payer: Self-pay | Admitting: Internal Medicine

## 2011-10-12 ENCOUNTER — Telehealth: Payer: Self-pay

## 2011-10-12 NOTE — Telephone Encounter (Signed)
Patient asking for Nexium samples, told her we were out but I would call her when we got some more; Patient agreed

## 2011-10-12 NOTE — Telephone Encounter (Signed)
Left message

## 2011-10-18 NOTE — Telephone Encounter (Signed)
cma opened another message phone note answered.

## 2011-10-20 NOTE — Telephone Encounter (Signed)
Another phone note was created by CMA.

## 2011-11-01 ENCOUNTER — Telehealth: Payer: Self-pay | Admitting: Hematology and Oncology

## 2011-11-01 ENCOUNTER — Other Ambulatory Visit: Payer: Self-pay | Admitting: Internal Medicine

## 2011-11-01 NOTE — Telephone Encounter (Signed)
Informed pt that we still did not have samples of Nexium.  Patient stating she was having alot of trouble without it so I am giving her some

## 2011-11-01 NOTE — Telephone Encounter (Signed)
Moved 4/24 appt to 5/23 due to LO out of office. Lb appt remains 4/18. lmonvm for pt re change and confirmed appts for 4/18 and 5/23. New schedule mailed today. Also comment added to 4/18 lb appt to send pt for new schedule.

## 2011-11-01 NOTE — Telephone Encounter (Signed)
Moved April appts to may due to LO out of office. lmonvm for pt re change w/new d/t's. Pt returned call and confirmed appt for 5/20 and 5/23. Lb also moved to may per pt.

## 2011-11-03 ENCOUNTER — Other Ambulatory Visit: Payer: Self-pay | Admitting: Lab

## 2011-11-04 ENCOUNTER — Telehealth: Payer: Self-pay | Admitting: Internal Medicine

## 2011-11-04 DIAGNOSIS — Z0000 Encounter for general adult medical examination without abnormal findings: Secondary | ICD-10-CM

## 2011-11-04 NOTE — Telephone Encounter (Signed)
Patient made cpe for 12/15/11. Please schedule labs for one week prior. Patient will be going to the [facility] lab.

## 2011-11-04 NOTE — Telephone Encounter (Signed)
Lab orders entered for May 2013 

## 2011-11-09 ENCOUNTER — Ambulatory Visit: Payer: Self-pay | Admitting: Hematology and Oncology

## 2011-11-28 ENCOUNTER — Telehealth: Payer: Self-pay

## 2011-11-28 NOTE — Telephone Encounter (Signed)
pt returning call about nexium samples; told her samples would be up front and also discussed the fact that the protonix we have been trying in place of nexium sometimes has been successful

## 2011-11-28 NOTE — Telephone Encounter (Signed)
Left message

## 2011-12-05 ENCOUNTER — Other Ambulatory Visit (HOSPITAL_BASED_OUTPATIENT_CLINIC_OR_DEPARTMENT_OTHER): Payer: Self-pay | Admitting: Lab

## 2011-12-05 DIAGNOSIS — N920 Excessive and frequent menstruation with regular cycle: Secondary | ICD-10-CM

## 2011-12-05 DIAGNOSIS — D509 Iron deficiency anemia, unspecified: Secondary | ICD-10-CM

## 2011-12-05 DIAGNOSIS — C4359 Malignant melanoma of other part of trunk: Secondary | ICD-10-CM

## 2011-12-05 LAB — CBC WITH DIFFERENTIAL/PLATELET
BASO%: 0.6 % (ref 0.0–2.0)
EOS%: 1.1 % (ref 0.0–7.0)
LYMPH%: 19.2 % (ref 14.0–49.7)
MCHC: 33.9 g/dL (ref 31.5–36.0)
MONO#: 0.5 10*3/uL (ref 0.1–0.9)
Platelets: 232 10*3/uL (ref 145–400)
RBC: 4.56 10*6/uL (ref 3.70–5.45)
WBC: 7.6 10*3/uL (ref 3.9–10.3)
lymph#: 1.5 10*3/uL (ref 0.9–3.3)
nRBC: 0 % (ref 0–0)

## 2011-12-05 LAB — BASIC METABOLIC PANEL
Chloride: 104 mEq/L (ref 96–112)
Creatinine, Ser: 0.8 mg/dL (ref 0.50–1.10)

## 2011-12-05 LAB — IRON AND TIBC
Iron: 79 ug/dL (ref 42–145)
TIBC: 258 ug/dL (ref 250–470)
UIBC: 179 ug/dL (ref 125–400)

## 2011-12-06 ENCOUNTER — Other Ambulatory Visit: Payer: Self-pay | Admitting: Internal Medicine

## 2011-12-06 DIAGNOSIS — Z1231 Encounter for screening mammogram for malignant neoplasm of breast: Secondary | ICD-10-CM

## 2011-12-08 ENCOUNTER — Encounter: Payer: Self-pay | Admitting: Hematology and Oncology

## 2011-12-08 ENCOUNTER — Telehealth: Payer: Self-pay | Admitting: Hematology and Oncology

## 2011-12-08 ENCOUNTER — Ambulatory Visit (HOSPITAL_BASED_OUTPATIENT_CLINIC_OR_DEPARTMENT_OTHER): Payer: Self-pay | Admitting: Hematology and Oncology

## 2011-12-08 ENCOUNTER — Telehealth: Payer: Self-pay | Admitting: *Deleted

## 2011-12-08 VITALS — BP 133/86 | HR 75 | Temp 97.3°F | Ht 68.0 in | Wt 298.7 lb

## 2011-12-08 DIAGNOSIS — D509 Iron deficiency anemia, unspecified: Secondary | ICD-10-CM

## 2011-12-08 DIAGNOSIS — D539 Nutritional anemia, unspecified: Secondary | ICD-10-CM

## 2011-12-08 DIAGNOSIS — N920 Excessive and frequent menstruation with regular cycle: Secondary | ICD-10-CM

## 2011-12-08 NOTE — Patient Instructions (Signed)
[name]  [id number]  [facility] Discharge Instructions  RECOMMENDATIONS MADE BY THE CONSULTANT AND ANY TEST RESULTS WILL BE SENT TO YOUR REFERRING DOCTOR.   EXAM FINDINGS BY MD TODAY AND SIGNS AND SYMPTOMS TO REPORT TO CLINIC OR PRIMARY MD:   Your current list of medications are: Current Outpatient Prescriptions  Medication Sig Dispense Refill  . esomeprazole (NEXIUM) 40 MG capsule Take 1 capsule (40 mg total) by mouth daily.  30 capsule  1     INSTRUCTIONS GIVEN AND DISCUSSED:   SPECIAL INSTRUCTIONS/FOLLOW-UP:  See above.  I acknowledge that I have been informed and understand all the instructions given to me and received a copy. I do not have any more questions at this time, but understand that I may call the [facility] at [phone] during business hours should I have any further questions or need assistance in obtaining follow-up care.

## 2011-12-08 NOTE — Progress Notes (Signed)
CC:   Courtney Court, MD  IDENTIFYING STATEMENT:  The patient is a 41 year old woman with anemia who presents for followup.  INTERVAL HISTORY:  The patient reports ongoing heavy menses.  She also notes generalized fatigue.  She last received iron in the form of Feraheme in August 2012.  Her lab results are as follows.  12/05/2011 white cell count 7.6, hemoglobin 14, hematocrit 41.4, platelets 232.  Iron 59, TIBC 258, saturation 31%, ferritin 40 (153).  MEDICATIONS:  Reviewed and updated.  ALLERGIES:  Carbamazepine.  PHYSICAL EXAMINATION:  General:  Patient is alert and oriented x3. Vitals:  Pulse 75, blood pressure 132/86, temperature 97.3, respirations 18, weight 298.7 pounds.  HEENT:  Head is atraumatic, normocephalic. Sclerae anicteric.  Mouth moist.  Chest/CVS:  Unremarkable.  Abdomen: Soft.  Extremities:  No edema.  LABORATORY DATA:  As above.  In addition, sodium 138, potassium 4.2, chloride 104, CO2 of 22, BUN 16, creatinine 0.8, glucose 92.  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  Her ferritin levels declined.  The patient is symptomatic and we will proceed with [facility] on Dec 14, 2011.  She follows up in 6 months' time with labs.    ______________________________ Laurice Record, M.D. LIO/MEDQ  D:  12/08/2011  T:  12/08/2011  Job:  [id number]

## 2011-12-08 NOTE — Telephone Encounter (Signed)
appts made and printed for pt ,mw to add iron and i will call the  Pt,pt aware   aom

## 2011-12-08 NOTE — Telephone Encounter (Signed)
Per saff message from [place], I have scheduled treamtent appt.  JMW

## 2011-12-08 NOTE — Progress Notes (Signed)
This office note has been dictated.

## 2011-12-13 ENCOUNTER — Ambulatory Visit (HOSPITAL_COMMUNITY): Payer: Self-pay | Admitting: Marriage and Family Therapist

## 2011-12-14 ENCOUNTER — Ambulatory Visit (HOSPITAL_BASED_OUTPATIENT_CLINIC_OR_DEPARTMENT_OTHER): Payer: Self-pay

## 2011-12-14 VITALS — BP 115/77 | HR 79 | Temp 97.7°F

## 2011-12-14 DIAGNOSIS — D509 Iron deficiency anemia, unspecified: Secondary | ICD-10-CM

## 2011-12-14 MED ORDER — SODIUM CHLORIDE 0.9 % IV SOLN
1020.0000 mg | Freq: Once | INTRAVENOUS | Status: AC
  Administered 2011-12-14: 1020 mg via INTRAVENOUS
  Filled 2011-12-14: qty 34

## 2011-12-14 MED ORDER — SODIUM CHLORIDE 0.9 % IV SOLN
Freq: Once | INTRAVENOUS | Status: AC
  Administered 2011-12-14: 08:00:00 via INTRAVENOUS

## 2011-12-15 ENCOUNTER — Ambulatory Visit (INDEPENDENT_AMBULATORY_CARE_PROVIDER_SITE_OTHER): Payer: Self-pay | Admitting: Internal Medicine

## 2011-12-15 ENCOUNTER — Other Ambulatory Visit: Payer: Self-pay | Admitting: *Deleted

## 2011-12-15 ENCOUNTER — Encounter: Payer: Self-pay | Admitting: Internal Medicine

## 2011-12-15 DIAGNOSIS — Z0000 Encounter for general adult medical examination without abnormal findings: Secondary | ICD-10-CM

## 2011-12-15 DIAGNOSIS — R079 Chest pain, unspecified: Secondary | ICD-10-CM | POA: Insufficient documentation

## 2011-12-15 LAB — HEPATIC FUNCTION PANEL
ALT: 19 U/L (ref 0–35)
Bilirubin, Direct: 0.1 mg/dL (ref 0.0–0.3)
Total Protein: 7 g/dL (ref 6.0–8.3)

## 2011-12-15 LAB — LIPID PANEL
Cholesterol: 184 mg/dL (ref 0–200)
HDL: 39 mg/dL — ABNORMAL LOW (ref >39)
Triglycerides: 215 mg/dL — ABNORMAL HIGH (ref <150)
VLDL: 43 mg/dL — ABNORMAL HIGH (ref 0–40)

## 2011-12-15 LAB — TSH: TSH: 1.735 u[IU]/mL (ref 0.350–4.500)

## 2011-12-15 NOTE — Progress Notes (Signed)
  Subjective:    Patient ID: [name], female    DOB: Dec 11, 1970, 41 y.o.   MRN: [id number]  HPI Pt presents to clinic for annual check up. Notes several day h/o left ear pain but improved over last 24 hours. Has intermittent left sided chest pain described as sharp but also chest pressure that radiated to lower neck. Was non exertional. Duration 45 minutes followed by spontaneous resolution. Notes premature fam hx of CAD with father with MI in 74's.   Past Medical History  Diagnosis Date  . Anemia   . Anxiety disorder   . Depression   . Fibromyalgia   . Obesity   . Melanoma     right hip and knee  . Chicken pox   . Allergy   . Migraine   . UTI (lower urinary tract infection)   . GERD (gastroesophageal reflux disease)    Past Surgical History  Procedure Date  . Back surgery   . Cholecystectomy   . Wisdom tooth extraction 1989  . Melanoma excision 2011    stage I and II    reports that she has never smoked. She has never used smokeless tobacco. She reports that she does not drink alcohol or use illicit drugs. family history includes Alcohol abuse in her other; Arthritis in her other; Cancer in her other; Colitis in her father; Diabetes in an unspecified family member; Heart attack (age of onset:46) in her father; Heart disease in her father; Hyperlipidemia in her other; Irritable bowel syndrome in an unspecified family member; Kidney disease in an unspecified family member; and Mental illness in her other.  There is no history of Colon cancer. Allergies  Allergen Reactions  . Carbamazepine      Review of Systems see hpi     Objective:   Physical Exam  Nursing note and vitals reviewed. Constitutional: She appears well-developed and well-nourished. No distress.  HENT:  Head: Normocephalic and atraumatic.  Right Ear: Tympanic membrane, external ear and ear canal normal.  Left Ear: Tympanic membrane, external ear and ear canal normal.  Nose: Nose normal.  Mouth/Throat:  Oropharynx is clear and moist. No oropharyngeal exudate.  Eyes: Conjunctivae and EOM are normal. Pupils are equal, round, and reactive to light. No scleral icterus.  Neck: Neck supple. Carotid bruit is not present. No thyromegaly present.  Cardiovascular: Normal rate, regular rhythm and normal heart sounds.  Exam reveals no gallop and no friction rub.   No murmur heard. Pulmonary/Chest: Effort normal and breath sounds normal. No respiratory distress. She has no wheezes. She has no rales.  Abdominal: Soft. Bowel sounds are normal. She exhibits no distension and no mass. There is no tenderness. There is no rebound and no guarding.  Lymphadenopathy:    She has no cervical adenopathy.  Neurological: She is alert.  Skin: Skin is warm and dry. She is not diaphoretic.  Psychiatric: She has a normal mood and affect.          Assessment & Plan:

## 2011-12-15 NOTE — Assessment & Plan Note (Signed)
EKG obtained shows nsr 81 with nl intervals and axis. No evidence of acute ischemic change. Currently asx. Given sx's and premature family hx of cad recommend proceeding with cardiology consult.

## 2011-12-15 NOTE — Assessment & Plan Note (Signed)
Nl exam. Obtain cpe labs (recent cbc and chem7 obtained by hematology reviewed)

## 2011-12-16 ENCOUNTER — Encounter: Payer: Self-pay | Admitting: Internal Medicine

## 2011-12-16 ENCOUNTER — Ambulatory Visit (INDEPENDENT_AMBULATORY_CARE_PROVIDER_SITE_OTHER): Payer: Self-pay | Admitting: Internal Medicine

## 2011-12-16 VITALS — BP 126/76 | HR 83 | Ht 68.0 in | Wt 296.0 lb

## 2011-12-16 DIAGNOSIS — K219 Gastro-esophageal reflux disease without esophagitis: Secondary | ICD-10-CM

## 2011-12-16 DIAGNOSIS — IMO0001 Reserved for inherently not codable concepts without codable children: Secondary | ICD-10-CM

## 2011-12-16 LAB — URINALYSIS, ROUTINE W REFLEX MICROSCOPIC
Bilirubin Urine: NEGATIVE
Hgb urine dipstick: NEGATIVE
Ketones, ur: NEGATIVE mg/dL
Specific Gravity, Urine: 1.024 (ref 1.005–1.030)
pH: 5.5 (ref 5.0–8.0)

## 2011-12-16 MED ORDER — ESOMEPRAZOLE MAGNESIUM 40 MG PO CPDR: 40.0000 mg | DELAYED_RELEASE_CAPSULE | Freq: Two times a day (BID) | ORAL | Status: DC

## 2011-12-16 NOTE — Patient Instructions (Signed)
Make follow up appointment with Dr.John Marina Goodell for reflux.

## 2011-12-16 NOTE — Progress Notes (Signed)
HPI Patient is a 41 year old with no known Hx of CAD  She was referred for evaluation of L chest pain.   The patien says she has had intermitt L sided pain that is sharp but also has a pressure.  Episodes are not related to actvity, often at rest  No SOB.  Last minutes to hours. She is fairly active.  Activity does not lead to above. SHe does have a history of signif reflux.  Had EGD in 2011 (at time of choley)  Had erosive esophagitis and stricture noted.  She is on Nexium qd and still has breakthrough reflux.  Also notes freq loose BMs each day that has not normalized since GB removal.  Note she had an echo done in 2011 that showed normal LV function.  Allergies  Allergen Reactions  . Carbamazepine     Current Outpatient Prescriptions  Medication Sig Dispense Refill  . esomeprazole (NEXIUM) 40 MG capsule Take 1 capsule (40 mg total) by mouth daily.  30 capsule  1  . Ferumoxytol (FERAHEME IV) Inject into the vein.        Past Medical History  Diagnosis Date  . Anemia   . Anxiety disorder   . Depression   . Fibromyalgia   . Obesity   . Melanoma     right hip and knee  . Chicken pox   . Allergy   . Migraine   . UTI (lower urinary tract infection)   . GERD (gastroesophageal reflux disease)     Past Surgical History  Procedure Date  . Back surgery   . Cholecystectomy   . Wisdom tooth extraction 1989  . Melanoma excision 2011    stage I and II    Family History  Problem Relation Age of Onset  . Colon cancer Neg Hx   . Colitis Father     crohn's  . Heart disease Father     grandfather  . Heart attack Father 55  . Diabetes      grandfather  . Irritable bowel syndrome    . Kidney disease      grandfather  . Alcohol abuse Other   . Arthritis Other   . Cancer Other     lung  . Hyperlipidemia Other   . Mental illness Other     History   Social History  . Marital Status: Divorced    Spouse Name: N/A    Number of Children: N/A  . Years of Education: N/A    Occupational History  . unemployed    Social History Main Topics  . Smoking status: Never Smoker   . Smokeless tobacco: Never Used  . Alcohol Use: No  . Drug Use: No  . Sexually Active: Not on file   Other Topics Concern  . Not on file   Social History Narrative   Daily caffeine    Review of Systems:  All systems reviewed.  They are negative to the above problem except as previously stated.  Vital Signs: BP 126/76  Pulse 83  Ht 5\' 8"  (1.727 m)  Wt 296 lb (134.265 kg)  BMI 45.01 kg/m2  LMP 11/23/2011  Physical Exam  HEENT:  Normocephalic, atraumatic. EOMI, PERRLA.  Neck: JVP is normal. No thyromegaly. No bruits.  Lungs: clear to auscultation. No rales no wheezes.  Heart: Regular rate and rhythm. Normal S1, S2. No S3.   No significant murmurs. PMI not displaced.  Abdomen:  Supple, nontender. Normal bowel sounds. No masses. No hepatomegaly.  Extremities:   Good distal pulses throughout. No lower extremity edema.  Musculoskeletal :moving all extremities.  Neuro:   alert and oriented x3.  CN II-XII grossly intact.  EKG:  5/30  NSR.  Nonspecfic ST T wave changes Assessment and Plan:  1.  CP  I am not convinced pain is cardiac in origin  Hx is atypical  Her cholesterol panel is pretty good.  She is not a smoker as her father was I am concerned that her pain may be GI in origin with her history as noted above.  I have recomm that she increase Nexium to BID  I think she should f/u with Kizzie Furnish in the near future. Will not sched further cardiac testing  2.  HCM  COunselled on wt loss and exercise.

## 2011-12-22 ENCOUNTER — Ambulatory Visit (HOSPITAL_COMMUNITY): Payer: Self-pay | Admitting: Marriage and Family Therapist

## 2011-12-29 ENCOUNTER — Ambulatory Visit (HOSPITAL_COMMUNITY): Payer: Self-pay | Admitting: Marriage and Family Therapist

## 2012-01-05 ENCOUNTER — Ambulatory Visit (HOSPITAL_COMMUNITY): Payer: Self-pay | Admitting: Marriage and Family Therapist

## 2012-01-06 ENCOUNTER — Telehealth: Payer: Self-pay

## 2012-01-06 NOTE — Telephone Encounter (Signed)
let pt know I had Nexium samples; pt agreed to pick up

## 2012-01-09 ENCOUNTER — Ambulatory Visit (HOSPITAL_COMMUNITY)
Admission: RE | Admit: 2012-01-09 | Discharge: 2012-01-09 | Disposition: A | Discharge status: Home / Self Care | Payer: Self-pay | Source: Ambulatory Visit | Attending: Internal Medicine | Admitting: Internal Medicine

## 2012-01-09 DIAGNOSIS — Z1231 Encounter for screening mammogram for malignant neoplasm of breast: Secondary | ICD-10-CM | POA: Insufficient documentation

## 2012-01-12 ENCOUNTER — Telehealth: Payer: Self-pay | Admitting: Hematology and Oncology

## 2012-01-12 ENCOUNTER — Other Ambulatory Visit: Payer: Self-pay | Admitting: Internal Medicine

## 2012-01-12 ENCOUNTER — Ambulatory Visit (HOSPITAL_COMMUNITY): Payer: Self-pay | Admitting: Marriage and Family Therapist

## 2012-01-12 ENCOUNTER — Telehealth: Payer: Self-pay | Admitting: *Deleted

## 2012-01-12 DIAGNOSIS — M2550 Pain in unspecified joint: Secondary | ICD-10-CM

## 2012-01-12 NOTE — Telephone Encounter (Signed)
Patient called and left voice message requesting a referral to Rheumatologist in the [facility] for ongoing knee, hip, and joint pain.

## 2012-01-12 NOTE — Telephone Encounter (Signed)
Returned pt's call and r/s nov appts for 9/23 and 10/1 due to pt's assistance will end 10/9. Ok per LO. D/t's per pt.

## 2012-01-23 ENCOUNTER — Ambulatory Visit: Payer: Self-pay | Admitting: Internal Medicine

## 2012-01-25 ENCOUNTER — Encounter: Payer: Self-pay | Admitting: Internal Medicine

## 2012-01-25 ENCOUNTER — Ambulatory Visit (INDEPENDENT_AMBULATORY_CARE_PROVIDER_SITE_OTHER): Payer: Self-pay | Admitting: Internal Medicine

## 2012-01-25 VITALS — BP 116/78 | HR 68 | Ht 68.0 in | Wt 290.5 lb

## 2012-01-25 DIAGNOSIS — K219 Gastro-esophageal reflux disease without esophagitis: Secondary | ICD-10-CM

## 2012-01-25 DIAGNOSIS — R197 Diarrhea, unspecified: Secondary | ICD-10-CM

## 2012-01-25 DIAGNOSIS — R079 Chest pain, unspecified: Secondary | ICD-10-CM

## 2012-01-25 DIAGNOSIS — K625 Hemorrhage of anus and rectum: Secondary | ICD-10-CM

## 2012-01-25 NOTE — Progress Notes (Signed)
HISTORY OF PRESENT ILLNESS:  [name] is a 41 y.o. female with multiple medical problems who presents today with a number of GI complaints. She has a history of iron deficiency anemia for which she underwent colonoscopy and upper endoscopy in February 2011. Colonoscopy, including intubation of the terminal ileum was normal. Upper endoscopy revealed erosive esophagitis, peptic stricture, and hiatal hernia. Duodenal biopsies were normal. Reflux precautions and PPI therapy as well as iron replacement recommended. The patient has been suboptimally compliant with PPI therapy due to lack of finances. She sent today regarding an isolated episode of chest pain which occurred in May. She saw her primary provider, subsequently a cardiologist. Cardiac cause was ruled out. Problem is felt to be musculoskeletal or GI. It is recommended that she see me. She's had no further problems with chest pain. For her GERD she takes Nexium 40 mg daily. She obtain samples from our office regularly. She does have problems with belching bloating and regurgitation. No pyrosis or dysphagia. She has had no significant weight loss despite being advised as to the importance. Alternating bowel habits continue. More problems with diarrhea since cholecystectomy. She wonders which he might do about diarrhea. This is generally postprandial period she also notices occasional minor bleeding with defecation.  REVIEW OF SYSTEMS:  All non-GI ROS negative except for sinus and allergy trouble, back pain, fatigue, menstrual pain, headaches, insomnia, excessive thirst, night sweats, muscle pains  Past Medical History  Diagnosis Date  . Anemia   . Anxiety disorder   . Depression   . Fibromyalgia   . Obesity   . Melanoma     right hip and knee  . Chicken pox   . Allergy   . Migraine   . UTI (lower urinary tract infection)   . GERD (gastroesophageal reflux disease)     Past Surgical History  Procedure Date  . Back surgery   .  Cholecystectomy   . Wisdom tooth extraction 1989  . Melanoma excision 2011    stage I and II    Social History [name]  reports that she has never smoked. She has never used smokeless tobacco. She reports that she does not drink alcohol or use illicit drugs.  family history includes Alcohol abuse in her other; Arthritis in her other; Cancer in her other; Colitis in her father; Diabetes in an unspecified family member; Emphysema in her maternal uncle; Heart attack (age of onset:46) in her father; Heart disease in her father; Hyperlipidemia in her other; Irritable bowel syndrome in an unspecified family member; Kidney disease in an unspecified family member; Mental illness in her other; and Other in her mother.  There is no history of Colon cancer.  Allergies  Allergen Reactions  . Carbamazepine        PHYSICAL EXAMINATION: Vital signs: BP 116/78  Pulse 68  Ht 5\' 8"  (1.727 m)  Wt 290 lb 8 oz (131.77 kg)  BMI 44.17 kg/m2  LMP 12/23/2011 General:Obese. Well-developed, well-nourished, no acute distress HEENT: Sclerae are anicteric, conjunctiva pink. Oral mucosa intact Lungs: Clear Heart: Regular Abdomen: soft,obese, nontender, nondistended, no obvious ascites, no peritoneal signs, normal bowel sounds. No organomegaly. Extremities: No edema Psychiatric: alert and oriented x3. Cooperative    ASSESSMENT:  #1. GERD with history of erosive esophagitis and peptic stricture. Ongoing #2. Recent bout of chest pain. Likely musculoskeletal #3. Postprandial diarrhea. Likely post cholecystectomy. #4. Minor rectal bleeding. Negative recent colonoscopy. Suspect fissure or hemorrhoid #5.History of iron deficiency. Likely due to  menstruation and esophagitis    PLAN:  #1. Reflux precautions with attention to weight loss #2. Continue PPI #3. Imodium when necessary diarrhea #4. GI followup as needed

## 2012-01-25 NOTE — Patient Instructions (Addendum)
Please follow up as needed 

## 2012-02-29 ENCOUNTER — Telehealth: Payer: Self-pay | Admitting: Internal Medicine

## 2012-03-01 NOTE — Telephone Encounter (Signed)
Left message with pt saying I would leave Nexium up front for her

## 2012-04-09 ENCOUNTER — Other Ambulatory Visit (HOSPITAL_BASED_OUTPATIENT_CLINIC_OR_DEPARTMENT_OTHER): Payer: Self-pay | Admitting: Lab

## 2012-04-09 DIAGNOSIS — D509 Iron deficiency anemia, unspecified: Secondary | ICD-10-CM

## 2012-04-09 DIAGNOSIS — D539 Nutritional anemia, unspecified: Secondary | ICD-10-CM

## 2012-04-09 LAB — BASIC METABOLIC PANEL (CC13)
BUN: 10 mg/dL (ref 7.0–26.0)
CO2: 24 mEq/L (ref 22–29)
Chloride: 105 mEq/L (ref 98–107)
Glucose: 94 mg/dl (ref 70–99)
Potassium: 4.2 mEq/L (ref 3.5–5.1)

## 2012-04-09 LAB — CBC WITH DIFFERENTIAL/PLATELET
Basophils Absolute: 0 10*3/uL (ref 0.0–0.1)
Eosinophils Absolute: 0.1 10*3/uL (ref 0.0–0.5)
HGB: 15.2 g/dL (ref 11.6–15.9)
MCV: 94.3 fL (ref 79.5–101.0)
MONO%: 7.8 % (ref 0.0–14.0)
NEUT#: 6.4 10*3/uL (ref 1.5–6.5)
RDW: 13 % (ref 11.2–14.5)
lymph#: 2 10*3/uL (ref 0.9–3.3)

## 2012-04-09 LAB — IRON AND TIBC: %SAT: 20 % (ref 20–55)

## 2012-04-09 LAB — FOLATE: Folate: >20 ng/mL

## 2012-04-13 ENCOUNTER — Telehealth: Payer: Self-pay | Admitting: Nurse Practitioner

## 2012-04-13 NOTE — Telephone Encounter (Signed)
Pt called with additional information re: needing to reschedule f/u appt.  States she would prefer f/u appt and infusion appt if needed to be before 10/10 d/t her financial assistance from [facility] will expire on 10/11.   RN advised that will attempt to schedule appt as requested, however, pt should reapply for financial assistance now before current expires.

## 2012-04-13 NOTE — Telephone Encounter (Signed)
Pt called requesting to reschedule f/u appointment due to she has jury duty.  F/U currently scheduled for Tuesday 04/17/2012.

## 2012-04-16 ENCOUNTER — Telehealth: Payer: Self-pay | Admitting: *Deleted

## 2012-04-16 NOTE — Telephone Encounter (Signed)
Move 10/1 appointment to 10/10 @ 1130. Schedule Faraheme infusion appointment after on same day. Call pt with appointments.  Left voice message to inform the patient of the new date and time on 04-26-2012 starting at 11:00am

## 2012-04-16 NOTE — Telephone Encounter (Signed)
Per patient phone call, I have given her the new appt date and time for next week. Appts r/s due patient has jury duty. I have scheduled her treatment per scheduler phone note. Patient questions if she needs lab appt or not. Patient  stated that she had labs on 9/23. I told her to keep that lab appt on 10/10 with MD visit. That we will call her regarding lab appt. Desk RN notified.    JMW

## 2012-04-17 ENCOUNTER — Ambulatory Visit: Payer: Self-pay | Admitting: Hematology and Oncology

## 2012-04-18 ENCOUNTER — Other Ambulatory Visit: Payer: Self-pay | Admitting: *Deleted

## 2012-04-19 ENCOUNTER — Telehealth: Payer: Self-pay | Admitting: *Deleted

## 2012-04-19 NOTE — Telephone Encounter (Signed)
Pt called wanting to know if pt should repeat lab draw again for upcoming f/u appt on 04/26/12.   Pt had labs done on 04/09/12;  However, pt did not come for f/u appt in Sept. Due to jury duty.   Instructed pt that she did not need to repeat labs again, but needs to keep office visit as scheduled and to receive iron infusion after visit.   Encouraged pt to reapply for financial assistance before her current plan expires.   Pt states she would do so, and voiced understanding.

## 2012-04-23 ENCOUNTER — Encounter: Payer: Self-pay | Admitting: Internal Medicine

## 2012-04-23 ENCOUNTER — Ambulatory Visit (INDEPENDENT_AMBULATORY_CARE_PROVIDER_SITE_OTHER): Payer: Self-pay | Admitting: Internal Medicine

## 2012-04-23 VITALS — BP 108/70 | HR 67 | Temp 98.0°F | Wt 273.0 lb

## 2012-04-23 DIAGNOSIS — E781 Pure hyperglyceridemia: Secondary | ICD-10-CM

## 2012-04-23 DIAGNOSIS — R7309 Other abnormal glucose: Secondary | ICD-10-CM

## 2012-04-23 DIAGNOSIS — E669 Obesity, unspecified: Secondary | ICD-10-CM

## 2012-04-23 DIAGNOSIS — R739 Hyperglycemia, unspecified: Secondary | ICD-10-CM

## 2012-04-23 NOTE — Assessment & Plan Note (Signed)
Low-fat diet exercise and weight loss. Recheck lipid profile prior to next visit

## 2012-04-23 NOTE — Patient Instructions (Signed)
Please schedule fasting labs prior to next visit Chem7, lipid, lft, tsh, ua with reflex micro-v70.0

## 2012-04-23 NOTE — Assessment & Plan Note (Signed)
Astigmatic. Encouraged weight loss efforts. Recheck Chem-7 with next visit

## 2012-04-23 NOTE — Assessment & Plan Note (Signed)
Congratulated on her efforts. Continue dietary modification and regular exercise.

## 2012-04-23 NOTE — Progress Notes (Signed)
  Subjective:    Patient ID: [name], female    DOB: [birth year], 41 y.o.   MRN: [id number]  HPI Pt presents to clinic for followup of multiple medical problems. Since last visit begin regular exercise program is going to the gym. Exercises once or twice daily. Has lost at least 25 pounds since last visit. Feels improved overall including mood. Continues to see hematology for history of high deficiency anemia with intermittent iron infusions. Denies current symptoms or further chest pain. History of mild hypertriglyceridemia without current medication. Declines influenza vaccine.  Past Medical History  Diagnosis Date  . Anemia   . Anxiety disorder   . Depression   . Fibromyalgia   . Obesity   . Melanoma     right hip and knee  . Chicken pox   . Allergy   . Migraine   . UTI (lower urinary tract infection)   . GERD (gastroesophageal reflux disease)    Past Surgical History  Procedure Date  . Back surgery   . Cholecystectomy   . Wisdom tooth extraction 1989  . Melanoma excision 2011    stage I and II    reports that she has never smoked. She has never used smokeless tobacco. She reports that she does not drink alcohol or use illicit drugs. family history includes Alcohol abuse in her other; Arthritis in her other; Cancer in her other; Colitis in her father; Diabetes in an unspecified family member; Emphysema in her maternal uncle; Heart attack (age of onset:46) in her father; Heart disease in her father; Hyperlipidemia in her other; Irritable bowel syndrome in an unspecified family member; Kidney disease in an unspecified family member; Mental illness in her other; and Other in her mother.  There is no history of Colon cancer. Allergies  Allergen Reactions  . Carbamazepine    \   Review of Systems see hpi     Objective:   Physical Exam  Physical Exam  Nursing note and vitals reviewed. Constitutional: Appears well-developed and well-nourished. No distress.  HENT:    Head: Normocephalic and atraumatic.  Right Ear: External ear normal.  Left Ear: External ear normal.  Eyes: Conjunctivae are normal. No scleral icterus.  Neck: Neck supple. Carotid bruit is not present.  Cardiovascular: Normal rate, regular rhythm and normal heart sounds.  Exam reveals no gallop and no friction rub.   No murmur heard. Pulmonary/Chest: Effort normal and breath sounds normal. No respiratory distress. He has no wheezes. no rales.  Lymphadenopathy:    He has no cervical adenopathy.  Neurological:Alert.  Skin: Skin is warm and dry. Not diaphoretic.  Psychiatric: Has a normal mood and affect.        Assessment & Plan:

## 2012-04-26 ENCOUNTER — Ambulatory Visit (HOSPITAL_BASED_OUTPATIENT_CLINIC_OR_DEPARTMENT_OTHER): Payer: Self-pay | Admitting: Hematology and Oncology

## 2012-04-26 ENCOUNTER — Telehealth: Payer: Self-pay | Admitting: Hematology and Oncology

## 2012-04-26 ENCOUNTER — Other Ambulatory Visit: Payer: Self-pay | Admitting: Lab

## 2012-04-26 ENCOUNTER — Ambulatory Visit: Payer: Self-pay

## 2012-04-26 ENCOUNTER — Telehealth: Payer: Self-pay | Admitting: *Deleted

## 2012-04-26 ENCOUNTER — Encounter: Payer: Self-pay | Admitting: Hematology and Oncology

## 2012-04-26 VITALS — BP 127/77 | HR 70 | Temp 97.9°F | Resp 20 | Ht 68.0 in | Wt 272.5 lb

## 2012-04-26 DIAGNOSIS — D539 Nutritional anemia, unspecified: Secondary | ICD-10-CM

## 2012-04-26 DIAGNOSIS — D509 Iron deficiency anemia, unspecified: Secondary | ICD-10-CM

## 2012-04-26 DIAGNOSIS — N920 Excessive and frequent menstruation with regular cycle: Secondary | ICD-10-CM

## 2012-04-26 NOTE — Telephone Encounter (Signed)
Per staff message and POF I have scheduled appts. JWM  

## 2012-04-26 NOTE — Progress Notes (Signed)
This office note has been dictated.

## 2012-04-26 NOTE — Progress Notes (Signed)
CC:   Charlynn Court, MD Wilhemina Bonito. Marina Goodell, MD  IDENTIFYING STATEMENT:  The patient is a 41 year old woman with anemia who presents for followup.  INTERVAL HISTORY:  The patient receives Feraheme infusions periodically. She has iron-deficiency anemia secondary to ongoing heavy menses.  This continues to be an ongoing issue.  She last received Feraheme in this clinic on 12/08/2011.  Lab results are noted below.  Otherwise, denies fatigue.  Denies chest pain or lightheadedness.  MEDICATIONS:  Reviewed and updated.  ALLERGIES:  Carbamazepine.  Past medical history, family history and social history unchanged.  REVIEW OF SYSTEMS:  A 10-point review of systems negative.  PHYSICAL EXAM:  Patient is alert and oriented x3.  Vitals:  Pulse 70, blood pressure 127/77, temperature 97.9, respirations 20, weight 272 pounds.  HEENT:  Mouth moist.  Chest/CVS:  Unremarkable.  Abdomen: Soft, nontender.  Bowel sounds present.  Extremities:  No edema.  LAB DATA:  On 04/09/2012, white cell count 9.2, hemoglobin 15.3, hematocrit 42.7, platelets 103, iron 48, TIBC 238, saturation 20%, ferritin 138 (40).  Sodium 139, potassium 4.2, chloride 105, CO2 24, BUN 10, creatinine 0.7, glucose 94, calcium 9.6.  IMPRESSION AND PLAN:  Ms. [name] is a 41 year old woman with a history of iron-deficiency anemia secondary to menorrhagia.  Her ferritin levels are adequate at this time.  She last received Feraheme on 12/08/2011. She was asked to follow up in 6 months' time with labs.    ______________________________ Laurice Record, M.D. LIO/MEDQ  D:  04/26/2012  T:  04/26/2012  Job:  [id number]

## 2012-04-26 NOTE — Patient Instructions (Signed)
[name]  [id number]   [facility] - AFTER VISIT SUMMARY   **RECOMMENDATIONS MADE BY THE CONSULTANT AND ANY TEST    RESULTS WILL BE SENT TO YOUR REFERRING DOCTORS.   YOUR EXAM FINDINGS, LABS AND RESULTS WERE DISCUSSED BY YOUR MD TODAY.  YOU CAN GO TO THE [facility] WEB SITE FOR INSTRUCTIONS ON HOW TO ASSESS MY CHART FOR ADDITIONAL INFORMATION AS NEEDED.  Your Updated drug allergies are: Allergies as of 04/26/2012 - Review Complete 04/23/2012  Allergen Reaction Noted  . Carbamazepine  08/18/2009    Your current list of medications are: Current Outpatient Prescriptions  Medication Sig Dispense Refill  . Ascorbic Acid (VITAMIN C WITH ROSE HIPS) 500 MG tablet Take 500 mg by mouth daily.      . Calcium Carbonate-Vitamin D (CALTRATE 600+D) 600-400 MG-UNIT per tablet Take 1 tablet by mouth daily.      . Cholecalciferol (VITAMIN D) 2000 UNITS tablet Take 2,000 Units by mouth 3 (three) times daily.      . Cyanocobalamin (B-12) 500 MCG TABS Take 1 tablet by mouth daily.      Marland Kitchen esomeprazole (NEXIUM) 40 MG capsule Take 40 mg by mouth daily.      . Ferumoxytol (FERAHEME IV) Inject 1,020 mg into the vein.       . folic acid (FOLVITE) 400 MCG tablet Take 400 mcg by mouth daily.      . Multiple Vitamins-Calcium (ONE-A-DAY WOMENS FORMULA) TABS Take 1 tablet by mouth daily.      . Omega-3 300 MG CAPS Take 1 tablet by mouth daily.      . Zinc Sulfate (ZINC 15 PO) Take 1 each by mouth daily.         INSTRUCTIONS GIVEN AND DISCUSSED:  See attached schedule   SPECIAL INSTRUCTIONS/FOLLOW-UP:  See above.  I acknowledge that I have been informed and understand all the instructions given to me and received a copy.I know to contact the clinic, my physician, or go to the emergency Department if any problems should occur.   I do not have any more questions at this time, but understand that I may call the [facility] at [phone] during business hours should I have any  further questions or need assistance in obtaining follow-up care.

## 2012-04-26 NOTE — Telephone Encounter (Signed)
Printed and gv appt schedule for pt for Feb 2014

## 2012-05-18 ENCOUNTER — Other Ambulatory Visit: Payer: Self-pay

## 2012-05-23 ENCOUNTER — Ambulatory Visit: Payer: Self-pay | Admitting: Hematology and Oncology

## 2012-05-23 ENCOUNTER — Telehealth: Payer: Self-pay | Admitting: Internal Medicine

## 2012-05-23 NOTE — Telephone Encounter (Signed)
Left message on vm letting patient know I would leave Nexium samples up front for her

## 2012-06-11 ENCOUNTER — Ambulatory Visit: Payer: Self-pay | Admitting: Internal Medicine

## 2012-06-20 ENCOUNTER — Other Ambulatory Visit: Payer: Self-pay | Admitting: Internal Medicine

## 2012-06-20 NOTE — Telephone Encounter (Signed)
LM on patient's vm that I was leaving samples of Nexium up front

## 2012-06-25 ENCOUNTER — Ambulatory Visit (INDEPENDENT_AMBULATORY_CARE_PROVIDER_SITE_OTHER): Payer: Self-pay | Admitting: Obstetrics & Gynecology

## 2012-06-25 ENCOUNTER — Encounter: Payer: Self-pay | Admitting: Obstetrics & Gynecology

## 2012-06-25 VITALS — BP 115/87 | HR 84 | Ht 69.0 in | Wt 267.2 lb

## 2012-06-25 DIAGNOSIS — N920 Excessive and frequent menstruation with regular cycle: Secondary | ICD-10-CM

## 2012-06-25 NOTE — Patient Instructions (Signed)
Dysfunctional Uterine Bleeding Normally, menstrual periods begin between ages 62 to 35 in young women. A normal menstrual cycle/period may begin every 23 days up to 35 days and lasts from 1 to 7 days. Around 12 to 14 days before your menstrual period starts, ovulation (ovary produces an egg) occurs. When counting the time between menstrual periods, count from the first day of bleeding of the previous period to the first day of bleeding of the next period. Dysfunctional (abnormal) uterine bleeding is bleeding that is different from a normal menstrual period. Your periods may come earlier or later than usual. They may be lighter, have blood clots or be heavier. You may have bleeding between periods, or you may skip one period or more. You may have bleeding after sexual intercourse, bleeding after menopause, or no menstrual period. CAUSES   Pregnancy (normal, miscarriage, tubal).  IUDs (intrauterine device, birth control).  Birth control pills.  Hormone treatment.  Menopause.  Infection of the cervix.  Blood clotting problems.  Infection of the inside lining of the uterus.  Endometriosis, inside lining of the uterus growing in the pelvis and other female organs.  Adhesions (scar tissue) inside the uterus.  Obesity or severe weight loss.  Uterine polyps inside the uterus.  Cancer of the vagina, cervix, or uterus.  Ovarian cysts or polycystic ovary syndrome.  Medical problems (diabetes, thyroid disease).  Uterine fibroids (noncancerous tumor).  Problems with your female hormones.  Endometrial hyperplasia, very thick lining and enlarged cells inside of the uterus.  Medicines that interfere with ovulation.  Radiation to the pelvis or abdomen.  Chemotherapy. DIAGNOSIS   Your doctor will discuss the history of your menstrual periods, medicines you are taking, changes in your weight, stress in your life, and any medical problems you may have.  Your doctor will do a physical  and pelvic examination.  Your doctor may want to perform certain tests to make a diagnosis, such as:  Pap test.  Blood tests.  Cultures for infection.  CT scan.  Ultrasound.  Hysteroscopy.  Laparoscopy.  MRI.  Hysterosalpingography.  D and C.  Endometrial biopsy. TREATMENT  Treatment will depend on the cause of the dysfunctional uterine bleeding (DUB). Treatment may include:  Observing your menstrual periods for a couple of months.  Prescribing medicines for medical problems, including:  Antibiotics.  Hormones.  Birth control pills.  Mirena IUD  Surgery:  D and C (scrape and remove tissue from inside the uterus).  Laparoscopy (examine inside the abdomen with a lighted tube).  Uterine ablation (destroy lining of the uterus with electrical current, laser, heat, or freezing).  Hysteroscopy (examine cervix and uterus with a lighted tube).  Hysterectomy (remove the uterus). HOME CARE INSTRUCTIONS   If medicines were prescribed, take exactly as directed. Do not change or switch medicines without consulting your caregiver.  Long term heavy bleeding may result in iron deficiency. Your caregiver may have prescribed iron pills. They help replace the iron that your body lost from heavy bleeding. Take exactly as directed.  Do not take aspirin or medicines that contain aspirin one week before or during your menstrual period. Aspirin may make the bleeding worse.  If you need to change your sanitary pad or tampon more than once every 2 hours, stay in bed with your feet elevated and a cold pack on your lower abdomen. Rest as much as possible, until the bleeding stops or slows down.  Eat well-balanced meals. Eat foods high in iron. Examples are:  Leafy green vegetables.  Whole-grain breads and cereals.  Eggs.  Meat.  Liver.  Do not try to lose weight until the abnormal bleeding has stopped and your blood iron level is back to normal. Do not lift more than ten  pounds or do strenuous activities when you are bleeding.  For a couple of months, make note on your calendar, marking the start and ending of your period, and the type of bleeding (light, medium, heavy, spotting, clots or missed periods). This is for your caregiver to better evaluate your problem. SEEK MEDICAL CARE IF:   You develop nausea (feeling sick to your stomach) and vomiting, dizziness, or diarrhea while you are taking your medicine.  You are getting lightheaded or weak.  You have any problems that may be related to the medicine you are taking.  You develop pain with your DUB.  You want to remove your IUD.  You want to stop or change your birth control pills or hormones.  You have any type of abnormal bleeding mentioned above.  You are over 67 years old and have not had a menstrual period yet.  You are 41 years old and you are still having menstrual periods.  You have any of the symptoms mentioned above.  You develop a rash. SEEK IMMEDIATE MEDICAL CARE IF:   An oral temperature above 102 F (38.9 C) develops.  You develop chills.  You are changing your sanitary pad or tampon more than once an hour.  You develop abdominal pain.  You pass out or faint. Document Released: 07/01/2000 Document Revised: 09/26/2011 Document Reviewed: 06/02/2009 [facility] Patient Information 2013 [place], [place].   Mirena Intrauterine Device Information An intrauterine device (IUD) is inserted into your uterus and prevents pregnancy.  Mirena IUD. This type of IUD contains the hormone progestin (synthetic progesterone). The hormone thickens the cervical mucus and prevents sperm from entering the uterus, and it also thins the uterine lining to prevent implantation of a fertilized egg. The hormone can weaken or kill the sperm that get into the uterus.  It is also FDA-approved to treat heavy periods. The hormone IUD can stay in place for 5 years. Your caregiver will make sure you are a good  candidate for a contraceptive IUD. Discuss with your caregiver the possible side effects. ADVANTAGES  It is highly effective, reversible, long-acting, and low maintenance.  There are no estrogen-related side effects.  An IUD can be used when breastfeeding.  It is not associated with weight gain.  It works immediately after insertion.  The Mirena can make heavy menstrual periods lighter.  The Mirena can be used for 5 years. DISADVANTAGES  The Mirena can be associated with irregular bleeding patterns.  You may experience cramping and vaginal bleeding after insertion. Document Released: 06/07/2004 Document Revised: 09/26/2011 Document Reviewed: 11/06/2010 [facility] Patient Information 2013 [place], [place]. Endometrial Ablation Endometrial ablation removes the lining of the uterus (endometrium). It is usually a same day, outpatient treatment. Ablation helps avoid major surgery (such as a hysterectomy). A hysterectomy is removal of the cervix and uterus. Endometrial ablation has less risk and complications, has a shorter recovery period and is less expensive. After endometrial ablation, most women will have little or no menstrual bleeding. You may not keep your fertility. Pregnancy is no longer likely after this procedure but if you are pre-menopausal, you still need to use a reliable method of birth control following the procedure because pregnancy can occur. REASONS TO HAVE THE PROCEDURE MAY INCLUDE:  Heavy periods.  Bleeding that is causing  anemia.  Anovulatory bleeding, very irregular, bleeding.  Bleeding submucous fibroids (on the lining inside the uterus) if they are smaller than 3 centimeters. REASONS NOT TO HAVE THE PROCEDURE MAY INCLUDE:  You wish to have more children.  You have a pre-cancerous or cancerous problem. The cause of any abnormal bleeding must be diagnosed before having the procedure.  You have pain coming from the uterus.  You have a submucus fibroid larger  than 3 centimeters.  You recently had a baby.  You recently had an infection in the uterus.  You have a severe retro-flexed, tipped uterus and cannot insert the instrument to do the ablation.  You had a Cesarean section or deep major surgery on the uterus.  The inner cavity of the uterus is too large for the endometrial ablation instrument. RISKS AND COMPLICATIONS   Perforation of the uterus.  Bleeding.  Infection of the uterus, bladder or vagina.  Injury to surrounding organs.  Cutting the cervix.  An air bubble to the lung (air embolus).  Pregnancy following the procedure.  Failure of the procedure to help the problem requiring hysterectomy.  Decreased ability to diagnose cancer in the lining of the uterus. BEFORE THE PROCEDURE  The lining of the uterus must be tested to make sure there is no pre-cancerous or cancer cells present.  Medications may be given to make the lining of the uterus thinner.  Ultrasound may be used to evaluate the size and look for abnormalities of the uterus.  Future pregnancy is not desired. PROCEDURE  There are different ways to destroy the lining of the uterus.   Resectoscope - radio frequency-alternating electric current is the most common one used.  Cryotherapy - freezing the lining of the uterus.  Heated Free Liquid - heated salt (saline) solution inserted into the uterus.  Microwave - uses high energy microwaves in the uterus.  Thermal Balloon - a catheter with a balloon tip is inserted into the uterus and filled with heated fluid. Your caregiver will talk with you about the method used in this clinic. They will also instruct you on the pros and cons of the procedure. Endometrial ablation is performed along with a procedure called operative hysteroscopy. A narrow viewing tube is inserted through the birth canal (vagina) and through the cervix into the uterus. A tiny camera attached to the viewing tube (hysteroscope) allows the  uterine cavity to be shown on a TV monitor during surgery. Your uterus is filled with a harmless liquid to make the procedure easier. The lining of the uterus is then removed. The lining can also be removed with a resectoscope which allows your surgeon to cut away the lining of the uterus under direct vision. Usually, you will be able to go home within an hour after the procedure. HOME CARE INSTRUCTIONS   Do not drive for 24 hours.  No tampons, douching or intercourse for 2 weeks or until your caregiver approves.  Rest at home for 24 to 48 hours. You may then resume normal activities unless told differently by your caregiver.  Take your temperature two times a day for 4 days, and record it.  Take any medications your caregiver has ordered, as directed.  Use some form of contraception if you are pre-menopausal and do not want to get pregnant. Bleeding after the procedure is normal. It varies from light spotting and mildly watery to bloody discharge for 4 to 6 weeks. You may also have mild cramping. Only take over-the-counter or prescription medicines for  pain, discomfort, or fever as directed by your caregiver. Do not use aspirin, as this may aggravate bleeding. Frequent urination during the first 24 hours is normal. You will not know how effective your surgery is until at least 3 months after the surgery. SEEK IMMEDIATE MEDICAL CARE IF:   Bleeding is heavier than a normal menstrual cycle.  An oral temperature above 102 F (38.9 C) develops.  You have increasing cramps or pains not relieved with medication or develop belly (abdominal) pain which does not seem to be related to the same area of earlier cramping and pain.  You are light headed, weak or have fainting episodes.  You develop pain in the shoulder strap areas.  You have chest or leg pain.  You have abnormal vaginal discharge.  You have painful urination. Document Released: 05/13/2004 Document Revised: 09/26/2011 Document  Reviewed: 08/11/2007 [facility] Patient Information 2013 [place], [place].

## 2012-06-25 NOTE — Progress Notes (Signed)
History:  41 y.o. G0P0 here today for cervical exam and for discussion of menorrhagia. She underwent a pap smear on 04/30/12 at the free cervical cancer screening that was normal, but the examiner noted a possible abnormal vessel vs hemangioma on the superior part of her cervix (around 12:30).  She is here for evaluation by a doctor.  Patient also reports heavy periods since menarche. Uses about 9 tampons a day for 5 days every month.  Occasionally feels lightheaded and dizzy during her period. She has been anemic for a few years and receives iron infusions; but has not had any gynecologic evaluation.  Last Hgb was 15.2.  Reports normal TSH checks, last one in 11/2011 was 1.7.  The following portions of the patient's history were reviewed and updated as appropriate: allergies, current medications, past family history, past medical history, past social history, past surgical history and problem list.  Review of Systems:  Pertinent items are noted in HPI.  Objective:  Physical Exam BP 115/87  Pulse 84  Ht 5\' 9"  (1.753 m)  Wt 267 lb 3.2 oz (121.201 kg)  BMI 39.46 kg/m2  LMP 06/12/2012 Gen: NAD Abd: Soft, nontender and nondistended Pelvic: Normal appearing external genitalia; normal appearing vaginal mucosa and cervix, no abnormal vessels seen.  Pinpoint red macule on the superior part of the cervix, not clinically significant.  Normal discharge.  Unable to palpate uterus or adnexa due to habitus; had right adnexal tenderness but not on the left. No uterine tenderness.  Lab Results  Component Value Date   WBC 9.2 04/09/2012   HGB 15.2 04/09/2012   HCT 43.7 04/09/2012   MCV 94.3 04/09/2012   PLT 213 04/09/2012     Assessment & Plan:  Normal cervical exam, patient reassured. Will obtain pelvic ultrasound, follow up results and manage accordingly. Patient was also told she will likely need an endometrial biopsy for further evaluation of her menorrhagia. Discussed management options for abnormal  uterine bleeding including oral Provera, Depo Provera, Mirena IUD, endometrial ablation (Novasure/Hydrothermal Ablation/Thermachoice) or hysterectomy as definitive surgical management.  Discussed risks and benefits of each method.   Printed patient education handouts were given to the patient to review at home.  Bleeding precautions reviewed.

## 2012-06-26 ENCOUNTER — Telehealth (HOSPITAL_COMMUNITY): Payer: Self-pay

## 2012-06-26 NOTE — Telephone Encounter (Signed)
1:59PM 06/26/12 CALLED AND LEFT ANOTHER MSG FOR PT - NEED TO SCHEDULE APPT WITH JOANIE./SH

## 2012-07-02 ENCOUNTER — Ambulatory Visit (HOSPITAL_COMMUNITY)
Admission: RE | Admit: 2012-07-02 | Discharge: 2012-07-02 | Disposition: A | Discharge status: Home / Self Care | Payer: Self-pay | Source: Ambulatory Visit | Attending: Obstetrics & Gynecology | Admitting: Obstetrics & Gynecology

## 2012-07-02 DIAGNOSIS — N920 Excessive and frequent menstruation with regular cycle: Secondary | ICD-10-CM | POA: Insufficient documentation

## 2012-07-02 DIAGNOSIS — D252 Subserosal leiomyoma of uterus: Secondary | ICD-10-CM | POA: Insufficient documentation

## 2012-07-04 ENCOUNTER — Encounter: Payer: Self-pay | Admitting: Obstetrics & Gynecology

## 2012-07-04 ENCOUNTER — Telehealth: Payer: Self-pay

## 2012-07-04 ENCOUNTER — Telehealth (HOSPITAL_COMMUNITY): Payer: Self-pay

## 2012-07-04 DIAGNOSIS — D219 Benign neoplasm of connective and other soft tissue, unspecified: Secondary | ICD-10-CM | POA: Insufficient documentation

## 2012-07-04 NOTE — Telephone Encounter (Signed)
Called pt and left message to return the call to the clinics.  

## 2012-07-04 NOTE — Telephone Encounter (Signed)
Message copied by Faythe Casa on Wed Jul 04, 2012  3:30 PM ------      Message from: Jaynie Collins A      Created: Wed Jul 04, 2012  3:27 PM       Patient has one large fibroid, normal adnexa. She can make an appointment to discuss further management. Please call to inform patient of results and recommendations.

## 2012-07-05 NOTE — Telephone Encounter (Signed)
Pt returned call to the clinics and I informed her fibroid.  Pt already has an appt scheduled on 12/30/58m for endo bx.  Pt stated that she was aware of appt and did not have any other questions.

## 2012-07-07 ENCOUNTER — Telehealth: Payer: Self-pay | Admitting: Oncology

## 2012-07-07 NOTE — Telephone Encounter (Signed)
s.w. pt and advised pt of new provider due to Dr. Marton Redwood leaving...pt ok...advised on new appt time...former physican Dr. Marton Redwood reassigned to Dr. Cyndie Chime

## 2012-07-12 ENCOUNTER — Telehealth: Payer: Self-pay

## 2012-07-12 NOTE — Telephone Encounter (Signed)
Pt called and wanted to know if she could still have the endo bx done if she is on her period for her appt. Called pt and informed pt that it is ok if she is bleeding and that it actually makes the procedure a little easier.  Pt stated "ok" and did not have any other questions.

## 2012-07-16 ENCOUNTER — Other Ambulatory Visit: Payer: Self-pay | Admitting: Obstetrics & Gynecology

## 2012-07-16 ENCOUNTER — Ambulatory Visit (HOSPITAL_COMMUNITY): Payer: No Typology Code available for payment source | Admitting: Psychiatry

## 2012-07-19 ENCOUNTER — Ambulatory Visit (HOSPITAL_COMMUNITY): Payer: No Typology Code available for payment source | Admitting: Psychiatry

## 2012-07-21 ENCOUNTER — Telehealth: Payer: Self-pay | Admitting: Oncology

## 2012-07-21 ENCOUNTER — Encounter: Payer: Self-pay | Admitting: Oncology

## 2012-07-21 NOTE — Telephone Encounter (Signed)
According to documentation pt has been contacted regarding new MD, letter printed today but not sent

## 2012-07-24 NOTE — Progress Notes (Signed)
This encounter was created in error - please disregard.

## 2012-07-31 NOTE — Progress Notes (Signed)
This encounter was created in error - please disregard.

## 2012-08-01 ENCOUNTER — Encounter: Payer: Self-pay | Admitting: Obstetrics & Gynecology

## 2012-08-01 ENCOUNTER — Other Ambulatory Visit (HOSPITAL_COMMUNITY)
Admission: RE | Admit: 2012-08-01 | Discharge: 2012-08-01 | Disposition: A | Discharge status: Home / Self Care | Payer: No Typology Code available for payment source | Source: Ambulatory Visit | Attending: Obstetrics & Gynecology | Admitting: Obstetrics & Gynecology

## 2012-08-01 ENCOUNTER — Ambulatory Visit (INDEPENDENT_AMBULATORY_CARE_PROVIDER_SITE_OTHER): Payer: No Typology Code available for payment source | Admitting: Obstetrics & Gynecology

## 2012-08-01 VITALS — BP 118/69 | HR 69 | Resp 20 | Ht 69.0 in | Wt 272.7 lb

## 2012-08-01 DIAGNOSIS — N920 Excessive and frequent menstruation with regular cycle: Secondary | ICD-10-CM | POA: Insufficient documentation

## 2012-08-01 DIAGNOSIS — D219 Benign neoplasm of connective and other soft tissue, unspecified: Secondary | ICD-10-CM

## 2012-08-01 DIAGNOSIS — D259 Leiomyoma of uterus, unspecified: Secondary | ICD-10-CM

## 2012-08-01 DIAGNOSIS — Z01812 Encounter for preprocedural laboratory examination: Secondary | ICD-10-CM

## 2012-08-01 NOTE — Patient Instructions (Addendum)

## 2012-08-01 NOTE — Progress Notes (Signed)
Subjective:     Patient ID: [name], female   DOB: 04/09/1971, 42 y.o.   MRN: [id number]  HPI 42 yo G0 present for Endobx.  Pt has been seen multiple times for menorrhagia.  She denies intermenstrual bleeding.  She c/o intermittent pelvic pain and a moving sensation in her pelvis. She wants definitive treatment for her sx.  Past Medical History  Diagnosis Date  . Anemia   . Fibromyalgia   . Obesity   . Melanoma     right hip and knee  . Chicken pox   . Allergy   . Migraine   . UTI (lower urinary tract infection)   . GERD (gastroesophageal reflux disease)   . Anxiety disorder   . Depression    Past Surgical History  Procedure Date  . Back surgery   . Cholecystectomy   . Wisdom tooth extraction 1989  . Melanoma excision 2011    stage I and II   Allergies  Allergen Reactions  . Carbamazepine    Current Outpatient Prescriptions on File Prior to Visit  Medication Sig Dispense Refill  . Ascorbic Acid (VITAMIN C WITH ROSE HIPS) 500 MG tablet Take 500 mg by mouth daily.      . Calcium Carbonate-Vitamin D (CALTRATE 600+D) 600-400 MG-UNIT per tablet Take 1 tablet by mouth daily.      . Cyanocobalamin (B-12) 500 MCG TABS Take 1 tablet by mouth daily.      Marland Kitchen esomeprazole (NEXIUM) 40 MG capsule Take 40 mg by mouth daily.      . Ferumoxytol (FERAHEME IV) Inject 1,020 mg into the vein every 3 (three) months.       . folic acid (FOLVITE) 400 MCG tablet Take 400 mcg by mouth daily.      . Multiple Vitamins-Calcium (ONE-A-DAY WOMENS FORMULA) TABS Take 1 tablet by mouth daily.      . Zinc Sulfate (ZINC 15 PO) Take 1 each by mouth daily.      . Cholecalciferol (VITAMIN D) 2000 UNITS tablet Take 2,000 Units by mouth 3 (three) times daily.      . Omega-3 300 MG CAPS Take 1 tablet by mouth daily.       History   Social History  . Marital Status: Divorced    Spouse Name: N/A    Number of Children: N/A  . Years of Education: N/A   Occupational History  . unemployed    Social  History Main Topics  . Smoking status: Never Smoker   . Smokeless tobacco: Never Used  . Alcohol Use: Yes     Comment: occasional  . Drug Use: No  . Sexually Active: Not Currently    Birth Control/ Protection: None   Other Topics Concern  . Not on file   Social History Narrative   Daily caffeine    Review of Systems     Objective:   Physical ExamBP 118/69  Pulse 69  Resp 20  Ht 5\' 9"  (1.753 m)  Wt 272 lb 11.2 oz (123.696 kg)  BMI 40.27 kg/m2  LMP 07/18/2012 Lungs: CTA CV: RRR Abd: obese, NT, ND The indications for endometrial biopsy were reviewed.   Risks of the biopsy including cramping, bleeding, infection, uterine perforation, inadequate specimen and need for additional procedures  were discussed. The patient states she understands and agrees to undergo procedure today. Consent was signed. Time out was performed. Urine HCG was negative. A sterile speculum was placed in the patient's vagina and the cervix was prepped  with Betadine. A single-toothed tenaculum was placed on the anterior lip of the cervix to stabilize it. The 3 mm pipelle was introduced into the endometrial cavity without difficulty to a depth of 11cm, and a moderate amount of tissue was obtained and sent to pathology. The instruments were removed from the patient's vagina. Minimal bleeding from the cervix was noted. The patient tolerated the procedure well.   07/02/12 Uterus: The uterus is anteverted and measures 9.2 x 7.3 x 6.1 cm.  A single subserosal exophytic fibroid extends from the right aspect  of the uterine fundus measuring 5.3 x 4.2 x 6.1 cm.  Endometrium: Normal in thickness and appearance. Double wall  thickness of 7 mm.  Right ovary: The right ovary is not visualized. No adnexal mass  is seen on the right.Question if the right ovary may be obscured by  the right fundal fibroid.  Left ovary: The left ovary has normal appearances, measures 4.3 x  2.7 x 2.4 cm, and contains several follicles. No  adnexal mass.  Other findings: No free fluid.  IMPRESSION:  1. Single subserosal exophytic uterine fibroid extends from the  right uterine fundus (5.3 x 4.2 x 6.1 cm).  2. Normal endometrium.  3. Normal left ovary.  4. Nonvisualization of the right ovary.      Assessment:     Symptomatic uterine fibroids with movement felt from pedunculated fibroid and menorrhagia    Plan:     Routine post-procedure instructions were given to the patient.  Schedule [facility]  [name] L. Harraway-Smith, M.D., [facility]

## 2012-08-01 NOTE — Progress Notes (Signed)
UPT - Negative

## 2012-08-06 ENCOUNTER — Telehealth: Payer: Self-pay | Admitting: General Practice

## 2012-08-06 ENCOUNTER — Telehealth: Payer: Self-pay | Admitting: *Deleted

## 2012-08-06 NOTE — Telephone Encounter (Addendum)
Patient called and left message stating she was returning our phone call- she assumed it was about her biopsy results and would like a call back. Called patient and informed her of normal endo bx results and asked what the doctor said her follow up plan would be. Patient stated she is waiting to hear about a surgery date but that she was also supposed to come here before her surgery. Told patient that once she was notified with surgery date she would also be given a surgical consult appt with [place] here in the clinic at that time as well. Patient verbalized understanding and had no further questions

## 2012-08-06 NOTE — Telephone Encounter (Signed)
Called and left a message we are calling with some non-urgent information for you, please call clinic.

## 2012-08-06 NOTE — Telephone Encounter (Signed)
Message copied by Mannie Stabile on Mon Aug 06, 2012  7:48 AM ------      Message from: [name]      Created: Fri Aug 03, 2012  3:45 PM       Please call and notify pt of normal Endobx.  She is to f/u as prev scheduled.            clh-S

## 2012-08-06 NOTE — Telephone Encounter (Signed)
Opened in error

## 2012-08-07 NOTE — Progress Notes (Signed)
This encounter was created in error - please disregard.

## 2012-08-15 ENCOUNTER — Ambulatory Visit (HOSPITAL_COMMUNITY): Payer: No Typology Code available for payment source | Admitting: Marriage and Family Therapist

## 2012-08-24 ENCOUNTER — Ambulatory Visit (INDEPENDENT_AMBULATORY_CARE_PROVIDER_SITE_OTHER): Payer: No Typology Code available for payment source | Admitting: Obstetrics & Gynecology

## 2012-08-24 ENCOUNTER — Other Ambulatory Visit (HOSPITAL_BASED_OUTPATIENT_CLINIC_OR_DEPARTMENT_OTHER): Payer: No Typology Code available for payment source | Admitting: Lab

## 2012-08-24 VITALS — BP 111/75 | HR 85 | Ht 69.0 in | Wt 278.3 lb

## 2012-08-24 DIAGNOSIS — D219 Benign neoplasm of connective and other soft tissue, unspecified: Secondary | ICD-10-CM

## 2012-08-24 DIAGNOSIS — D539 Nutritional anemia, unspecified: Secondary | ICD-10-CM

## 2012-08-24 DIAGNOSIS — D259 Leiomyoma of uterus, unspecified: Secondary | ICD-10-CM

## 2012-08-24 LAB — CBC WITH DIFFERENTIAL/PLATELET
BASO%: 0.5 % (ref 0.0–2.0)
EOS%: 1 % (ref 0.0–7.0)
HCT: 44.5 % (ref 34.8–46.6)
LYMPH%: 23.3 % (ref 14.0–49.7)
MCH: 31.1 pg (ref 25.1–34.0)
MCHC: 33.9 g/dL (ref 31.5–36.0)
NEUT%: 68.1 % (ref 38.4–76.8)
Platelets: 232 10*3/uL (ref 145–400)

## 2012-08-24 LAB — BASIC METABOLIC PANEL (CC13)
BUN: 13.5 mg/dL (ref 7.0–26.0)
CO2: 26 mEq/L (ref 22–29)
Calcium: 9.3 mg/dL (ref 8.4–10.4)
Creatinine: 0.8 mg/dL (ref 0.6–1.1)

## 2012-08-24 LAB — IRON AND TIBC
TIBC: 284 ug/dL (ref 250–470)
UIBC: 201 ug/dL (ref 125–400)

## 2012-08-24 NOTE — Patient Instructions (Signed)
Hysterectomy Care After Refer to this sheet in the next few weeks. These instructions provide you with information on caring for yourself after your procedure. Your caregiver may also give you more specific instructions. Your treatment has been planned according to current medical practices, but problems sometimes occur. Call your caregiver if you have any problems or questions after your procedure. HOME CARE INSTRUCTIONS  Healing will take time. You may have discomfort, tenderness, swelling, and bruising at the surgical site for about 2 weeks. This is normal and will get better as time goes on.  Only take over-the-counter or prescription medicines for pain, discomfort, or fever as directed by your caregiver.  Do not take aspirin. It can cause bleeding.  Do not drive when taking pain medicine.  Follow your caregiver's advice regarding exercise, lifting, driving, and general activities.  Resume your usual diet as directed and allowed.  Get plenty of rest and sleep.  Do not douche, use tampons, or have sexual intercourse for at least 6 weeks or until your caregiver gives you permission.  Change your bandages (dressings) as directed by your caregiver.  Monitor your temperature.  Take showers instead of baths for 2 to 3 weeks.  Do not drink alcohol until your caregiver gives you permission.  If you are constipated, you may take a mild laxative with your caregiver's permission. Bran foods may help with constipation problems. Drinking enough fluids to keep your urine clear or pale yellow may help as well.  Try to have someone home with you for 1 or 2 weeks to help around the house.  Keep all of your follow-up appointments as directed by your caregiver. SEEK MEDICAL CARE IF:   You have swelling, redness, or increasing pain in the surgical cut (incision) area.  You have pus coming from the incision.  You notice a bad smell coming from the incision or dressing.  You have swelling,  redness, or pain around the intravenous (IV) site.  Your incision breaks open.  You feel dizzy or lightheaded.  You have pain or bleeding when you urinate.  You have persistent diarrhea.  You have persistent nausea and vomiting.  You have abnormal vaginal discharge.  You have a rash.  You have any type of abnormal reaction or develop an allergy to your medicine.  Your pain is not controlled with your prescribed medicine. SEEK IMMEDIATE MEDICAL CARE IF:   You have a fever.  You have severe abdominal pain.  You have chest pain.  You have shortness of breath.  You faint.  You have pain, swelling, or redness of your leg.  You have heavy vaginal bleeding with blood clots. MAKE SURE YOU:  Understand these instructions.  Will watch your condition.  Will get help right away if you are not doing well or get worse. Document Released: 01/21/2005 Document Revised: 09/26/2011 Document Reviewed: 02/18/2011 [facility] Patient Information 2013 [place], [place]. Hysterectomy Information  A hysterectomy is a procedure where your uterus is surgically removed. It will no longer be possible to have menstrual periods or to become pregnant. The tubes and ovaries can be removed (bilateral salpingo-oopherectomy) during this surgery as well.  REASONS FOR A HYSTERECTOMY  Persistent, abnormal bleeding.  Lasting (chronic) pelvic pain or infection.  The lining of the uterus (endometrium) starts growing outside the uterus (endometriosis).  The endometrium starts growing in the muscle of the uterus (adenomyosis).  The uterus falls down into the vagina (pelvic organ prolapse).  Symptomatic uterine fibroids.  Precancerous cells.  Cervical cancer or  uterine cancer. TYPES OF HYSTERECTOMIES  Supracervical hysterectomy. This type removes the top part of the uterus, but not the cervix.  Total hysterectomy. This type removes the uterus and cervix.  Radical hysterectomy. This type removes the  uterus, cervix, and the fibrous tissue that holds the uterus in place in the pelvis (parametrium). WAYS A HYSTERECTOMY CAN BE PERFORMED  Abdominal hysterectomy. A large surgical cut (incision) is made in the abdomen. The uterus is removed through this incision.  Vaginal hysterectomy. An incision is made in the vagina. The uterus is removed through this incision. There are no abdominal incisions.  Conventional laparoscopic hysterectomy. A thin, lighted tube with a camera (laparoscope) is inserted into 3 or 4 small incisions in the abdomen. The uterus is cut into small pieces. The small pieces are removed through the incisions, or they are removed through the vagina.  Laparoscopic assisted vaginal hysterectomy (LAVH). Three or four small incisions are made in the abdomen. Part of the surgery is performed laparoscopically and part vaginally. The uterus is removed through the vagina.  Robot-assisted laparoscopic hysterectomy. A laparoscope is inserted into 3 or 4 small incisions in the abdomen. A computer-controlled device is used to give the surgeon a 3D image. This allows for more precise movements of surgical instruments. The uterus is cut into small pieces and removed through the incisions or removed through the vagina. RISKS OF HYSTERECTOMY   Bleeding and risk of blood transfusion. Tell your caregiver if you do not want to receive any blood products.  Blood clots in the legs or lung.  Infection.  Injury to surrounding organs.  Anesthesia problems or side effects.  Conversion to an abdominal hysterectomy. WHAT TO EXPECT AFTER A HYSTERECTOMY  You will be given pain medicine.  You will need to have someone with you for the first 3 to 5 days after you go home.  You will need to follow up with your surgeon in 2 to 4 weeks after surgery to evaluate your progress.  You may have early menopause symptoms like hot flashes, night sweats, and insomnia.  If you had a hysterectomy for a problem  that was not a cancer or a condition that could lead to cancer, then you no longer need Pap tests. However, even if you no longer need a Pap test, a regular exam is a good idea to make sure no other problems are starting. Document Released: 12/28/2000 Document Revised: 09/26/2011 Document Reviewed: 02/12/2011 [facility] Patient Information 2013 [place], [place].

## 2012-08-24 NOTE — Progress Notes (Signed)
Patient ID: [name], female   DOB: 1971/03/20, 42 y.o.   MRN: [id number] Patient desires surgical management with laparoscopic assisted hyst with bilateral salpingectomy.  The risks of surgery were discussed in detail with the patient including but not limited to: bleeding which may require transfusion or reoperation; infection which may require prolonged hospitalization or re-hospitalization and antibiotic therapy; injury to bowel, bladder, ureters and major vessels or other surrounding organs; need for additional procedures including laparotomy; thromboembolic phenomenon, incisional problems and other postoperative or anesthesia complications.  Patient was told that the likelihood that her condition and symptoms will be treated effectively with this surgical management was very high; the postoperative expectations were also discussed in detail. The patient also understands the alternative treatment options which were discussed in full. All questions were answered.  She was told that she will be contacted by our surgical scheduler regarding the time and date of her surgery; routine preoperative instructions of having nothing to eat or drink after midnight on the day prior to surgery and also coming to the hospital 1 1/2 hours prior to her time of surgery were also emphasized.  She was told she may be called for a preoperative appointment about a week prior to surgery and will be given further preoperative instructions at that visit. Printed patient education handouts about the procedure were given to the patient to review at home.

## 2012-08-28 ENCOUNTER — Ambulatory Visit (HOSPITAL_BASED_OUTPATIENT_CLINIC_OR_DEPARTMENT_OTHER): Payer: No Typology Code available for payment source | Admitting: Nurse Practitioner

## 2012-08-28 ENCOUNTER — Ambulatory Visit: Payer: Self-pay | Admitting: Hematology and Oncology

## 2012-08-28 ENCOUNTER — Ambulatory Visit: Payer: Self-pay

## 2012-08-28 VITALS — BP 123/78 | HR 73 | Temp 98.5°F | Resp 20 | Ht 69.0 in | Wt 277.4 lb

## 2012-08-28 DIAGNOSIS — E611 Iron deficiency: Secondary | ICD-10-CM

## 2012-08-28 DIAGNOSIS — D509 Iron deficiency anemia, unspecified: Secondary | ICD-10-CM

## 2012-08-28 NOTE — Progress Notes (Signed)
OFFICE PROGRESS NOTE  Interval history:  [name] is a 41 year old woman with a history of iron deficiency anemia. She reports being referred to Dr. Dalene Carrow in early 2011 after failing a three-month trial of oral iron. Labs on 08/11/2009 showed a hemoglobin of 12.5, MCV 78.1, white count 9.4, platelet count 294,000, normal chemistry panel, B12 normal at 856, LDH normal at 129, ferritin low at 6, folate greater than 20, DAT negative, serum protein electrophoresis with a normal pattern with slight decrease in albumin, serum M spike not detected; urinalysis negative for blood. She underwent an upper endoscopy on 08/20/2009 with findings of erosive esophagitis in the distal esophagus, stricture in the distal esophagus, hiatal hernia. Biopsy of the duodenum showed benign small bowel mucosa. She was started on a proton pump inhibitor. Colonoscopy also on 08/20/2009 showed a normal colon and normal terminal ileum. The iron deficiency was attributed to menorrhagia. She has received periodic IV iron over the past several years. She most recently had Feraheme on 12/14/2011 at which time the hemoglobin was 14, MCV 91 and ferritin 40.  She reports undergoing a cholecystectomy in February 2012. She had low back surgery 1999. 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  Medications were reviewed.  She reports an intolerance to Tegretol with hallucinations and dizziness.  Her father died at age 24 of a heart attack. He had a history of iron deficiency as well as gastric ulcers. Her mother is 110. 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 No siblings.  She lives in [place]. She is divorced. No children. She is unemployed. She was previously employed in a medical office. No tobacco or alcohol use. She has never had a blood transfusion. She reports eating a normal diet.  Over the past few weeks she has noted  progressive fatigue. She denies any bleeding except related to her monthly menstrual cycle. She does not characterize the monthly menstrual cycle as heavy. She notes heavy bleeding for 2 days and then 5 days of light bleeding. She reports being scheduled for a hysterectomy later this month due to uterine fibroids.    Objective: Blood pressure 123/78, pulse 73, temperature 98.5 F (36.9 C), temperature source Oral, resp. rate 20, height 5\' 9"  (1.753 m), weight 277 lb 6.4 oz (125.828 kg), last menstrual period 08/13/2012.  Oropharynx is without thrush or ulceration. No palpable cervical or supraclavicular lymph nodes. Lungs are clear. Regular cardiac rhythm. Abdomen is soft and nontender. Obese. No obvious organomegaly. Extremities are without edema.  Lab Results: Lab Results  Component Value Date   WBC 7.5 08/24/2012   HGB 15.1 08/24/2012   HCT 44.5 08/24/2012   MCV 91.8 08/24/2012   PLT 232 08/24/2012    Chemistry:    Chemistry      Component Value Date/Time   NA 138 08/24/2012 0917   NA 138 12/05/2011 1018   K 4.4 08/24/2012 0917   K 4.2 12/05/2011 1018   CL 102 08/24/2012 0917   CL 104 12/05/2011 1018   CO2 26 08/24/2012 0917   CO2 22 12/05/2011 1018   BUN 13.5 08/24/2012 0917   BUN 16 12/05/2011 1018   CREATININE 0.8 08/24/2012 0917   CREATININE 0.80 12/05/2011 1018      Component Value Date/Time   CALCIUM 9.3 08/24/2012 0917   CALCIUM 9.0 12/05/2011 1018   ALKPHOS 88 12/15/2011 0851   AST 15 12/15/2011  0851   ALT 19 12/15/2011 0851   BILITOT 0.5 12/15/2011 0851     Labs 08/24/2012: Ferritin 107, iron 83, UIBC 201, TIBC 284, percent saturation 29, folate greater than 20.   Studies/Results: No results found.  Medications: I have reviewed the patient's current medications.  Assessment/Plan:  1. History of iron deficiency likely due to menorrhagia. She most recently received IV iron on 12/14/2011. 2. Uterine fibroids, menorrhagia with hysterectomy planned later this month. 3. History of erosive  esophagitis identified on upper endoscopy 08/20/2009. She continues a proton pump inhibitor.  Disposition-[name]'s hemoglobin and ferritin are in normal range. The iron deficiency is most likely related to monthly menstrual blood loss and potentially the erosive esophagitis. She is scheduled to undergo a hysterectomy later this month.  We did not schedule formal followup in our office. Dr. Truett Perna recommends a CBC and ferritin level every 6 months through her primary provider's office. We will be happy to see her in the future should she develop recurrent iron deficiency.  Plan reviewed with Dr. Truett Perna.  Lonna Cobb ANP/GNP-BC

## 2012-09-03 ENCOUNTER — Encounter (HOSPITAL_COMMUNITY): Payer: Self-pay | Admitting: Pharmacist

## 2012-09-04 ENCOUNTER — Ambulatory Visit (INDEPENDENT_AMBULATORY_CARE_PROVIDER_SITE_OTHER): Payer: No Typology Code available for payment source | Admitting: Marriage and Family Therapist

## 2012-09-04 DIAGNOSIS — F3289 Other specified depressive episodes: Secondary | ICD-10-CM

## 2012-09-04 DIAGNOSIS — F329 Major depressive disorder, single episode, unspecified: Secondary | ICD-10-CM

## 2012-09-04 DIAGNOSIS — F411 Generalized anxiety disorder: Secondary | ICD-10-CM

## 2012-09-05 ENCOUNTER — Telehealth: Payer: Self-pay | Admitting: Internal Medicine

## 2012-09-05 NOTE — Progress Notes (Signed)
Patient ID: [name], female   DOB: 1971-06-14, 42 y.o.   MRN: [id number]       PSYCHOSOCIAL UPDATE   Time: 2:00 - 3:00 p.m.  Participation level:  Active  Behavioral response:  Casual, alert, anxious and depressed  Interventions:  Assessment, supportive, strength-based  Mood:  Anxious; Depressed  Reason for treatment:  Patient is a 42 year-old female with a history of depression and anxiety.  She was originally referred by Dr. Lolly Mustache however has requested coming back for therapy.  Patient has not been in for therapy since 8/43/12.  She reports she "needs therapy again" because she has been caretaking two grandmothers and her mother due to their medical problems.  Patient also states she has been having medical problems again.  She reports having a tumor on her uterus and will be having a hysterectomy on 43/27/14.  She continues to get checked for skin cancer every three months but has been symptoms free.  She also reports she needs more than solely taking care of her family and wants to either work or go to college.  She is hoping therapy will help her make that decision.  Patient does admit to having "strong emotions" and believes her reaction is not appropriate for situations.  She wonders if she is having mood swings.  Patient reports she has been having less depression and anxiety and attributes this decrease to going through therapy in the past.  She admits that one of the most important things she got from past therapy is not being so angry at her parents and understanding they learned what they could considering how they were raised.    Suicidal/Homicidal Assessment:  Patient has no suicidal or homicidal thinking at this time.    Substance Abuse:  Patient states she no longer uses substances and this is a "non-issue."  Dates of previous [facility] admissions:  Therapy from 8/43/11 to 8/43/12.  She has never had therapy before this time.  She saw Dr. Lolly Mustache for medications during  previous therapy but no longer is taking psychotropic medications.  Changes since last treatment:  Medical problems:  Patient has been diagnosed with a tumor on her uterus.  Patient will be having a hysterectomy next week.   Caretaking grandmothers and mother.  Current medications:  See chart for list of medications.  Summary:  Patient is a 42 y/o female with a history of depression and anxiety.  42 has had cancer in the past and now has a tumor on her uterus and does not know if this tumor is cancer as well.  Her mother and grandmothers all have medical problems at this time and patient is now the sole caretaker of all three, causing stress 42 as well as not allowing patient to have her own life on a regular basis.  She wants to do something more and expresses a desire to work and/or go to school but does not know how she would do either due to families' medical appointments etc.  Patient's improvement from her last therapy is excellent and she has stopped using opiates, experiences more emotional stability, but at this point therapy will be about fine-tuning her depressed and anxious symptoms and to help her find some sort of life outside of taking care of her family.   Goals for therapy:  Patient's previous goal in therapy was "I want to geet back to a place where I can function day to day."  She has achieved this goal.  "Make sure  my emotions are not extreme.  I want help deciding whether to work or go to school."     Diagnosis: Axis I. 311 Depressive d/o NOS; GAD   Axis II. Deferred   Axis III. Current tumors/hysterectomy; past skin cancer   Axis IV. Problems with health, primary support, work and school   Axis V.  Current GAF 55; Past year:  61  Treatment plan:  Patient will attend weekly sessions.  She will discussed how to find better balance in her life in light of her circumstances.  She will address identify if her emotions are appropriate for the situation but also find ways to soothe  herself including meditation, biofeedback, and possibly exercise.  Will also revisit CBT to refine cognitive distortions that may be effecting her emotions.  Will help patient with decision of college and/or work.  Return in one week.   Cleophas Dunker, LMFT, CTS

## 2012-09-07 ENCOUNTER — Encounter (HOSPITAL_COMMUNITY): Payer: Self-pay

## 2012-09-07 ENCOUNTER — Encounter (HOSPITAL_COMMUNITY)
Admission: RE | Admit: 2012-09-07 | Discharge: 2012-09-07 | Disposition: A | Discharge status: Home / Self Care | Payer: Self-pay | Source: Ambulatory Visit | Attending: Obstetrics & Gynecology | Admitting: Obstetrics & Gynecology

## 2012-09-07 HISTORY — DX: Adverse effect of unspecified anesthetic, initial encounter: T41.45XA

## 2012-09-07 HISTORY — DX: Shortness of breath: R06.02

## 2012-09-07 LAB — CBC
HCT: 43.5 % (ref 36.0–46.0)
Hemoglobin: 14.7 g/dL (ref 12.0–15.0)
MCV: 92 fL (ref 78.0–100.0)
RDW: 12.8 % (ref 11.5–15.5)
WBC: 8.8 10*3/uL (ref 4.0–10.5)

## 2012-09-07 NOTE — Progress Notes (Signed)
Pt has had 2 moles removed from back and left lower leg- in healing stage- they do not look infected

## 2012-09-07 NOTE — Patient Instructions (Signed)
Your procedure is scheduled on:09/13/12  Enter through the Main Entrance at :11am Pick up desk phone and dial [phone] and inform us of your arrival.  Please call [phone] if you have any problems the morning of surgery.  Remember: Do not eat after midnight:Wed. Clear liquids ok until 0830 am Thursday  Take these meds the morning of surgery with a sip of water:Nexium  DO NOT wear jewelry, eye make-up, lipstick,body lotion, or dark fingernail polish. Do not shave for 48 hours prior to surgery.  If you are to be admitted after surgery, leave suitcase in car until your room has been assigned. Patients discharged on the day of surgery will not be allowed to drive home.

## 2012-09-11 ENCOUNTER — Ambulatory Visit (HOSPITAL_COMMUNITY): Payer: No Typology Code available for payment source | Admitting: Marriage and Family Therapist

## 2012-09-13 ENCOUNTER — Encounter (HOSPITAL_COMMUNITY): Payer: Self-pay | Admitting: Anesthesiology

## 2012-09-13 ENCOUNTER — Observation Stay (HOSPITAL_COMMUNITY)
Admission: RE | Admit: 2012-09-13 | Discharge: 2012-09-14 | Disposition: A | Discharge status: Home / Self Care | Payer: Self-pay | Source: Ambulatory Visit | Attending: Obstetrics & Gynecology | Admitting: Obstetrics & Gynecology

## 2012-09-13 ENCOUNTER — Ambulatory Visit (HOSPITAL_COMMUNITY): Payer: Self-pay | Admitting: Anesthesiology

## 2012-09-13 ENCOUNTER — Encounter (HOSPITAL_COMMUNITY)
Admission: RE | Disposition: A | Discharge status: Home / Self Care | Payer: Self-pay | Source: Ambulatory Visit | Attending: Obstetrics & Gynecology

## 2012-09-13 DIAGNOSIS — N949 Unspecified condition associated with female genital organs and menstrual cycle: Secondary | ICD-10-CM

## 2012-09-13 DIAGNOSIS — N920 Excessive and frequent menstruation with regular cycle: Principal | ICD-10-CM

## 2012-09-13 DIAGNOSIS — D219 Benign neoplasm of connective and other soft tissue, unspecified: Secondary | ICD-10-CM

## 2012-09-13 DIAGNOSIS — D251 Intramural leiomyoma of uterus: Secondary | ICD-10-CM

## 2012-09-13 DIAGNOSIS — D259 Leiomyoma of uterus, unspecified: Secondary | ICD-10-CM

## 2012-09-13 HISTORY — PX: BILATERAL SALPINGECTOMY: SHX5743

## 2012-09-13 HISTORY — PX: LAPAROSCOPIC ASSISTED VAGINAL HYSTERECTOMY: SHX5398

## 2012-09-13 LAB — CBC
Hemoglobin: 10.9 g/dL — ABNORMAL LOW (ref 12.0–15.0)
MCH: 31.4 pg (ref 26.0–34.0)
MCV: 91.9 fL (ref 78.0–100.0)
RBC: 3.47 MIL/uL — ABNORMAL LOW (ref 3.87–5.11)
WBC: 23.7 10*3/uL — ABNORMAL HIGH (ref 4.0–10.5)

## 2012-09-13 SURGERY — HYSTERECTOMY, VAGINAL, LAPAROSCOPY-ASSISTED
Anesthesia: General | Site: Vagina | Wound class: Clean Contaminated

## 2012-09-13 MED ORDER — ONDANSETRON HCL 4 MG/2ML IJ SOLN
INTRAMUSCULAR | Status: AC
  Filled 2012-09-13: qty 2

## 2012-09-13 MED ORDER — MEPERIDINE HCL 25 MG/ML IJ SOLN: 6.2500 mg | INTRAMUSCULAR | Status: DC | PRN

## 2012-09-13 MED ORDER — PANTOPRAZOLE SODIUM 40 MG IV SOLR
40.0000 mg | Freq: Every day | INTRAVENOUS | Status: DC
  Administered 2012-09-13: 40 mg via INTRAVENOUS
  Filled 2012-09-13 (×2): qty 40

## 2012-09-13 MED ORDER — FENTANYL CITRATE 0.05 MG/ML IJ SOLN
INTRAMUSCULAR | Status: AC
  Filled 2012-09-13: qty 2

## 2012-09-13 MED ORDER — 0.9 % SODIUM CHLORIDE (POUR BTL) OPTIME
TOPICAL | Status: DC | PRN
  Administered 2012-09-13: 1000 mL

## 2012-09-13 MED ORDER — MIDAZOLAM HCL 5 MG/5ML IJ SOLN
INTRAMUSCULAR | Status: DC | PRN
  Administered 2012-09-13: 2 mg via INTRAVENOUS

## 2012-09-13 MED ORDER — MEPERIDINE HCL 25 MG/ML IJ SOLN
INTRAMUSCULAR | Status: AC
  Administered 2012-09-13: 12.5 mg via INTRAVENOUS
  Filled 2012-09-13: qty 1

## 2012-09-13 MED ORDER — LIDOCAINE HCL (CARDIAC) 20 MG/ML IV SOLN
INTRAVENOUS | Status: AC
  Filled 2012-09-13: qty 5

## 2012-09-13 MED ORDER — MORPHINE SULFATE 4 MG/ML IJ SOLN
1.0000 mg | INTRAMUSCULAR | Status: DC | PRN
  Administered 2012-09-13: 1 mg via INTRAVENOUS
  Administered 2012-09-13 – 2012-09-14 (×3): 2 mg via INTRAVENOUS
  Filled 2012-09-13 (×4): qty 1

## 2012-09-13 MED ORDER — PROPOFOL 10 MG/ML IV EMUL
INTRAVENOUS | Status: AC
  Filled 2012-09-13: qty 20

## 2012-09-13 MED ORDER — LACTATED RINGERS IV SOLN
INTRAVENOUS | Status: DC
  Administered 2012-09-13 (×5): via INTRAVENOUS

## 2012-09-13 MED ORDER — GLYCOPYRROLATE 0.2 MG/ML IJ SOLN
INTRAMUSCULAR | Status: AC
  Filled 2012-09-13: qty 1

## 2012-09-13 MED ORDER — BUPIVACAINE HCL (PF) 0.5 % IJ SOLN
INTRAMUSCULAR | Status: DC | PRN
  Administered 2012-09-13: 10 mL

## 2012-09-13 MED ORDER — NEOSTIGMINE METHYLSULFATE 1 MG/ML IJ SOLN
INTRAMUSCULAR | Status: AC
  Filled 2012-09-13: qty 1

## 2012-09-13 MED ORDER — VASOPRESSIN 20 UNIT/ML IJ SOLN
INTRAMUSCULAR | Status: AC
  Filled 2012-09-13: qty 1

## 2012-09-13 MED ORDER — PROMETHAZINE HCL 25 MG/ML IJ SOLN
INTRAMUSCULAR | Status: AC
  Administered 2012-09-13: 12.5 mg via INTRAVENOUS
  Filled 2012-09-13: qty 1

## 2012-09-13 MED ORDER — INDIGOTINDISULFONATE SODIUM 8 MG/ML IJ SOLN
INTRAMUSCULAR | Status: AC
  Filled 2012-09-13: qty 5

## 2012-09-13 MED ORDER — KETOROLAC TROMETHAMINE 30 MG/ML IJ SOLN: 30.0000 mg | Freq: Four times a day (QID) | INTRAMUSCULAR | Status: DC

## 2012-09-13 MED ORDER — LACTATED RINGERS IR SOLN
Status: DC | PRN
  Administered 2012-09-13: 3000 mL

## 2012-09-13 MED ORDER — MIDAZOLAM HCL 2 MG/2ML IJ SOLN: 0.5000 mg | Freq: Once | INTRAMUSCULAR | Status: DC | PRN

## 2012-09-13 MED ORDER — SIMETHICONE 80 MG PO CHEW: 80.0000 mg | CHEWABLE_TABLET | Freq: Four times a day (QID) | ORAL | Status: DC | PRN

## 2012-09-13 MED ORDER — HYDROMORPHONE HCL PF 1 MG/ML IJ SOLN
INTRAMUSCULAR | Status: DC | PRN
  Administered 2012-09-13: 1 mg via INTRAVENOUS

## 2012-09-13 MED ORDER — KETOROLAC TROMETHAMINE 30 MG/ML IJ SOLN: 30.0000 mg | Freq: Once | INTRAMUSCULAR | Status: DC

## 2012-09-13 MED ORDER — LACTATED RINGERS IV SOLN: INTRAVENOUS | Status: DC

## 2012-09-13 MED ORDER — DEXAMETHASONE SODIUM PHOSPHATE 4 MG/ML IJ SOLN
INTRAMUSCULAR | Status: DC | PRN
  Administered 2012-09-13: 10 mg via INTRAVENOUS

## 2012-09-13 MED ORDER — ESTRADIOL 0.1 MG/GM VA CREA
TOPICAL_CREAM | VAGINAL | Status: AC
  Filled 2012-09-13: qty 42.5

## 2012-09-13 MED ORDER — ONDANSETRON HCL 4 MG/2ML IJ SOLN
INTRAMUSCULAR | Status: DC | PRN
  Administered 2012-09-13: 4 mg via INTRAVENOUS

## 2012-09-13 MED ORDER — DOCUSATE SODIUM 100 MG PO CAPS
100.0000 mg | ORAL_CAPSULE | Freq: Two times a day (BID) | ORAL | Status: DC
  Administered 2012-09-14: 100 mg via ORAL
  Filled 2012-09-13: qty 1

## 2012-09-13 MED ORDER — GLYCOPYRROLATE 0.2 MG/ML IJ SOLN
INTRAMUSCULAR | Status: DC | PRN
  Administered 2012-09-13: 0.6 mg via INTRAVENOUS

## 2012-09-13 MED ORDER — KETOROLAC TROMETHAMINE 30 MG/ML IJ SOLN
30.0000 mg | Freq: Four times a day (QID) | INTRAMUSCULAR | Status: DC
  Administered 2012-09-13 – 2012-09-14 (×2): 30 mg via INTRAVENOUS
  Filled 2012-09-13 (×2): qty 1

## 2012-09-13 MED ORDER — CEFAZOLIN SODIUM 10 G IJ SOLR
3.0000 g | INTRAMUSCULAR | Status: AC
  Administered 2012-09-13: 3 g via INTRAVENOUS
  Filled 2012-09-13: qty 3000

## 2012-09-13 MED ORDER — ROCURONIUM BROMIDE 100 MG/10ML IV SOLN
INTRAVENOUS | Status: DC | PRN
  Administered 2012-09-13 (×2): 10 mg via INTRAVENOUS
  Administered 2012-09-13: 5 mg via INTRAVENOUS
  Administered 2012-09-13: 35 mg via INTRAVENOUS
  Administered 2012-09-13: 10 mg via INTRAVENOUS

## 2012-09-13 MED ORDER — FENTANYL CITRATE 0.05 MG/ML IJ SOLN: 25.0000 ug | INTRAMUSCULAR | Status: DC | PRN

## 2012-09-13 MED ORDER — FLAVOXATE HCL 100 MG PO TABS
100.0000 mg | ORAL_TABLET | Freq: Three times a day (TID) | ORAL | Status: DC | PRN
  Administered 2012-09-13: 100 mg via ORAL
  Filled 2012-09-13: qty 1

## 2012-09-13 MED ORDER — ZOLPIDEM TARTRATE 5 MG PO TABS: 5.0000 mg | ORAL_TABLET | Freq: Every evening | ORAL | Status: DC | PRN

## 2012-09-13 MED ORDER — HYDROMORPHONE HCL PF 1 MG/ML IJ SOLN
INTRAMUSCULAR | Status: AC
  Filled 2012-09-13: qty 1

## 2012-09-13 MED ORDER — ONDANSETRON HCL 4 MG/2ML IJ SOLN: 4.0000 mg | Freq: Four times a day (QID) | INTRAMUSCULAR | Status: DC | PRN

## 2012-09-13 MED ORDER — NEOSTIGMINE METHYLSULFATE 1 MG/ML IJ SOLN
INTRAMUSCULAR | Status: DC | PRN
  Administered 2012-09-13: 3 mg via INTRAVENOUS

## 2012-09-13 MED ORDER — KETOROLAC TROMETHAMINE 30 MG/ML IJ SOLN: 15.0000 mg | Freq: Once | INTRAMUSCULAR | Status: DC | PRN

## 2012-09-13 MED ORDER — FENTANYL CITRATE 0.05 MG/ML IJ SOLN
INTRAMUSCULAR | Status: AC
  Administered 2012-09-13: 50 ug via INTRAVENOUS
  Filled 2012-09-13: qty 2

## 2012-09-13 MED ORDER — DEXAMETHASONE SODIUM PHOSPHATE 10 MG/ML IJ SOLN
INTRAMUSCULAR | Status: AC
  Filled 2012-09-13: qty 1

## 2012-09-13 MED ORDER — BUPIVACAINE HCL (PF) 0.5 % IJ SOLN
INTRAMUSCULAR | Status: AC
  Filled 2012-09-13: qty 30

## 2012-09-13 MED ORDER — ALPRAZOLAM 0.5 MG PO TABS
0.5000 mg | ORAL_TABLET | Freq: Three times a day (TID) | ORAL | Status: DC | PRN
  Administered 2012-09-13: 0.5 mg via ORAL
  Filled 2012-09-13: qty 1

## 2012-09-13 MED ORDER — MENTHOL 3 MG MT LOZG: 1.0000 | LOZENGE | OROMUCOSAL | Status: DC | PRN

## 2012-09-13 MED ORDER — PROMETHAZINE HCL 25 MG/ML IJ SOLN: 6.2500 mg | INTRAMUSCULAR | Status: DC | PRN

## 2012-09-13 MED ORDER — OXYCODONE-ACETAMINOPHEN 5-325 MG PO TABS
1.0000 | ORAL_TABLET | ORAL | Status: DC | PRN
  Administered 2012-09-14 (×2): 2 via ORAL
  Filled 2012-09-13 (×2): qty 2

## 2012-09-13 MED ORDER — INDIGOTINDISULFONATE SODIUM 8 MG/ML IJ SOLN
INTRAMUSCULAR | Status: DC | PRN
  Administered 2012-09-13: 3 mL via INTRAVENOUS

## 2012-09-13 MED ORDER — FENTANYL CITRATE 0.05 MG/ML IJ SOLN
INTRAMUSCULAR | Status: AC
  Filled 2012-09-13: qty 5

## 2012-09-13 MED ORDER — ONDANSETRON HCL 4 MG PO TABS: 4.0000 mg | ORAL_TABLET | Freq: Four times a day (QID) | ORAL | Status: DC | PRN

## 2012-09-13 MED ORDER — LIDOCAINE HCL (CARDIAC) 20 MG/ML IV SOLN
INTRAVENOUS | Status: DC | PRN
  Administered 2012-09-13: 50 mg via INTRAVENOUS
  Administered 2012-09-13: 30 mg via INTRAVENOUS

## 2012-09-13 MED ORDER — HEMOSTATIC AGENTS (NO CHARGE) OPTIME
TOPICAL | Status: DC | PRN
  Administered 2012-09-13: 1 via TOPICAL

## 2012-09-13 MED ORDER — MIDAZOLAM HCL 2 MG/2ML IJ SOLN
INTRAMUSCULAR | Status: AC
  Filled 2012-09-13: qty 2

## 2012-09-13 MED ORDER — GLYCOPYRROLATE 0.2 MG/ML IJ SOLN
INTRAMUSCULAR | Status: AC
  Filled 2012-09-13: qty 2

## 2012-09-13 MED ORDER — PROPOFOL 10 MG/ML IV EMUL
INTRAVENOUS | Status: DC | PRN
  Administered 2012-09-13: 180 mg via INTRAVENOUS
  Administered 2012-09-13: 20 mg via INTRAVENOUS

## 2012-09-13 MED ORDER — FENTANYL CITRATE 0.05 MG/ML IJ SOLN
INTRAMUSCULAR | Status: DC | PRN
  Administered 2012-09-13 (×2): 50 ug via INTRAVENOUS
  Administered 2012-09-13: 100 ug via INTRAVENOUS
  Administered 2012-09-13: 50 ug via INTRAVENOUS
  Administered 2012-09-13: 100 ug via INTRAVENOUS

## 2012-09-13 MED ORDER — DEXTROSE-NACL 5-0.45 % IV SOLN
INTRAVENOUS | Status: DC
  Administered 2012-09-14: 03:00:00 via INTRAVENOUS

## 2012-09-13 MED ORDER — VASOPRESSIN 20 UNIT/ML IJ SOLN: INTRAVENOUS | Status: DC | PRN

## 2012-09-13 SURGICAL SUPPLY — 57 items
APL SKNCLS STERI-STRIP NONHPOA (GAUZE/BANDAGES/DRESSINGS) ×2
APPLIER CLIP 5 13 M/L LIGAMAX5 (MISCELLANEOUS) ×3
APPLIER CLIP ROT 10 11.4 M/L (STAPLE) ×3
APR CLP MED LRG 11.4X10 (STAPLE) ×2
APR CLP MED LRG 5 ANG JAW (MISCELLANEOUS) ×2
BANDAGE ADHESIVE 1X3 (GAUZE/BANDAGES/DRESSINGS) ×1 IMPLANT
BENZOIN TINCTURE PRP APPL 2/3 (GAUZE/BANDAGES/DRESSINGS) ×1 IMPLANT
CABLE HIGH FREQUENCY MONO STRZ (ELECTRODE) IMPLANT
CATH ROBINSON RED A/P 16FR (CATHETERS) IMPLANT
CHLORAPREP W/TINT 26ML (MISCELLANEOUS) ×3 IMPLANT
CLIP APPLIE 5 13 M/L LIGAMAX5 (MISCELLANEOUS) IMPLANT
CLIP APPLIE ROT 10 11.4 M/L (STAPLE) IMPLANT
CLOTH BEACON ORANGE TIMEOUT ST (SAFETY) ×3 IMPLANT
CONT PATH 16OZ SNAP LID 3702 (MISCELLANEOUS) ×3 IMPLANT
COVER TABLE BACK 60X90 (DRAPES) ×3 IMPLANT
DECANTER SPIKE VIAL GLASS SM (MISCELLANEOUS) ×2 IMPLANT
DRSG COVADERM PLUS 2X2 (GAUZE/BANDAGES/DRESSINGS) ×1 IMPLANT
ELECT REM PT RETURN 9FT ADLT (ELECTROSURGICAL) ×3
ELECTRODE REM PT RTRN 9FT ADLT (ELECTROSURGICAL) ×2 IMPLANT
FILTER SMOKE EVAC LAPAROSHD (FILTER) ×1 IMPLANT
GAUZE PACKING 2X5 YD STERILE (GAUZE/BANDAGES/DRESSINGS) IMPLANT
GAUZE SPONGE 4X4 16PLY XRAY LF (GAUZE/BANDAGES/DRESSINGS) ×1 IMPLANT
GLOVE BIO SURGEON STRL SZ7 (GLOVE) ×3 IMPLANT
GLOVE BIOGEL PI IND STRL 6.5 (GLOVE) ×2 IMPLANT
GLOVE BIOGEL PI IND STRL 7.0 (GLOVE) ×2 IMPLANT
GLOVE BIOGEL PI INDICATOR 6.5 (GLOVE) ×1
GLOVE BIOGEL PI INDICATOR 7.0 (GLOVE) ×1
GOWN PREVENTION PLUS XLARGE (GOWN DISPOSABLE) ×3 IMPLANT
GOWN STRL REIN XL XLG (GOWN DISPOSABLE) ×12 IMPLANT
MANIPULATOR UTERINE 4.5 ZUMI (MISCELLANEOUS) ×4 IMPLANT
MATRIX HEMOSTAT SURGIFLO (HEMOSTASIS) ×1 IMPLANT
NS IRRIG 1000ML POUR BTL (IV SOLUTION) ×3 IMPLANT
PACK LAVH (CUSTOM PROCEDURE TRAY) ×3 IMPLANT
PROTECTOR NERVE ULNAR (MISCELLANEOUS) ×3 IMPLANT
SCALPEL HARMONIC ACE (MISCELLANEOUS) ×1 IMPLANT
SCISSORS LAP 5X35 DISP (ENDOMECHANICALS) IMPLANT
SET IRRIG TUBING LAPAROSCOPIC (IRRIGATION / IRRIGATOR) ×1 IMPLANT
SOLUTION ELECTROLUBE (MISCELLANEOUS) IMPLANT
STRIP CLOSURE SKIN 1/4X3 (GAUZE/BANDAGES/DRESSINGS) ×1 IMPLANT
SUT VIC AB 0 CT1 18XCR BRD8 (SUTURE) ×6 IMPLANT
SUT VIC AB 0 CT1 27 (SUTURE) ×6
SUT VIC AB 0 CT1 27XBRD ANBCTR (SUTURE) ×4 IMPLANT
SUT VIC AB 0 CT1 8-18 (SUTURE) ×9
SUT VIC AB 3-0 SH 18 (SUTURE) IMPLANT
SUT VIC AB 3-0 X1 27 (SUTURE) ×3 IMPLANT
SUT VICRYL 0 TIES 12 18 (SUTURE) ×3 IMPLANT
SUT VICRYL 0 UR6 27IN ABS (SUTURE) ×6 IMPLANT
SUT VICRYL 4-0 PS2 18IN ABS (SUTURE) ×3 IMPLANT
SYR 30ML LL (SYRINGE) ×3 IMPLANT
TOWEL OR 17X24 6PK STRL BLUE (TOWEL DISPOSABLE) ×6 IMPLANT
TRAY FOLEY CATH 14FR (SET/KITS/TRAYS/PACK) ×3 IMPLANT
TROCAR BALLN 12MMX100 BLUNT (TROCAR) ×3 IMPLANT
TROCAR OPTI TIP 5M 100M (ENDOMECHANICALS) IMPLANT
TROCAR XCEL NON-BLD 11X100MML (ENDOMECHANICALS) ×1 IMPLANT
TROCAR XCEL NON-BLD 5MMX100MML (ENDOMECHANICALS) ×3 IMPLANT
WARMER LAPAROSCOPE (MISCELLANEOUS) ×3 IMPLANT
WATER STERILE IRR 1000ML POUR (IV SOLUTION) ×3 IMPLANT

## 2012-09-13 NOTE — Brief Op Note (Signed)
09/13/2012  4:22 PM  PATIENT:  [name]  42 y.o. female  PRE-OPERATIVE DIAGNOSIS:  CPT [phone] Menorrhagia and uterine fibroids  POST-OPERATIVE DIAGNOSIS:  Menorrhagia and uterine fibroids  PROCEDURE:  Procedure(s): LAPAROSCOPIC ASSISTED VAGINAL HYSTERECTOMY (N/A) BILATERAL SALPINGECTOMY (Bilateral)  SURGEON:  Surgeon(s) and Role:    * Willodean Rosenthal, MD - Primary    * Catalina Antigua, MD - Assisting  ANESTHESIA:   general  EBL:  Total I/O In: 3500 [I.V.:3500] Out: 1525 [Urine:300; Blood:1225]  BLOOD ADMINISTERED:none  DRAINS: none   LOCAL MEDICATIONS USED:  MARCAINE     SPECIMEN:  Source of Specimen:  uterus and fallopian  DISPOSITION OF SPECIMEN:  PATHOLOGY  COUNTS:  YES  TOURNIQUET:  * No tourniquets in log *  DICTATION: .Note written in EPIC  PLAN OF CARE: Admit for overnight observation  PATIENT DISPOSITION:  PACU - hemodynamically stable.   Delay start of Pharmacological VTE agent (>24hrs) due to surgical blood loss or risk of bleeding: yes

## 2012-09-13 NOTE — Anesthesia Preprocedure Evaluation (Addendum)
Anesthesia Evaluation  Patient identified by MRN, date of birth, ID band Patient awake    Reviewed: Allergy & Precautions, H&P , Patient's Chart, lab work & pertinent test results, reviewed documented beta blocker date and time   History of Anesthesia Complications Negative for: history of anesthetic complications  Airway Mallampati: III TM Distance: >3 FB Neck ROM: limited  Mouth opening: Limited Mouth Opening  Dental no notable dental hx.    Pulmonary neg pulmonary ROS, shortness of breath and with exertion,  breath sounds clear to auscultation  Pulmonary exam normal       Cardiovascular Exercise Tolerance: Good negative cardio ROS  Rhythm:regular Rate:Normal     Neuro/Psych  Headaches, PSYCHIATRIC DISORDERS Depression  Neuromuscular disease negative neurological ROS  negative psych ROS   GI/Hepatic negative GI ROS, Neg liver ROS, GERD-  Controlled,  Endo/Other  negative endocrine ROSMorbid obesity  Renal/GU negative Renal ROS     Musculoskeletal  (+) Fibromyalgia -  Abdominal   Peds  Hematology negative hematology ROS (+) anemia ,   Anesthesia Other Findings Fibromyalgia     Obesity        Melanoma   right hip and knee Chicken pox        Allergy     Migraine        UTI (lower urinary tract infection)     GERD (gastroesophageal reflux disease)        Anxiety disorder     Anemia   in the past    Depression   in teh past- ok now Shortness of breath   when anemic    Complication of anesthesia 1999 urinary retention    Reproductive/Obstetrics negative OB ROS                          Anesthesia Physical Anesthesia Plan  ASA: III  Anesthesia Plan: General ETT   Post-op Pain Management:    Induction:   Airway Management Planned:   Additional Equipment:   Intra-op Plan:   Post-operative Plan:   Informed Consent: I have reviewed the patients History and Physical, chart, labs and  discussed the procedure including the risks, benefits and alternatives for the proposed anesthesia with the patient or authorized representative who has indicated his/her understanding and acceptance.   Dental Advisory Given  Plan Discussed with: CRNA and Surgeon  Anesthesia Plan Comments:         Anesthesia Quick Evaluation

## 2012-09-13 NOTE — H&P (Signed)
[name] is an 42 y.o. female. Pt with a h/o pelvic pain and menorrhagia.  She is scheduled for a LAVH with bilateral salpingectomy.   Pertinent Gynecological History: Menses: irregualr and heavy Bleeding: intermenstrual bleeding Contraception: none DES exposure: denies Blood transfusions: none Sexually transmitted diseases: no past history Previous GYN Procedures: unk  Last pap: normal Date: 10/13 OB History: G0, P0   Menstrual History: Menarche age: 49  No LMP recorded.    Past Medical History  Diagnosis Date  . Fibromyalgia   . Obesity   . Melanoma     right hip and knee  . Chicken pox   . Allergy   . Migraine   . UTI (lower urinary tract infection)   . GERD (gastroesophageal reflux disease)   . Anxiety disorder   . Anemia     in the past  . Depression     in teh past- ok now  . Shortness of breath     when anemic  . Complication of anesthesia 1999    urinary retention    Past Surgical History  Procedure Laterality Date  . Back surgery    . Cholecystectomy    . Wisdom tooth extraction  1989  . Melanoma excision  2011    stage I and II    Family History  Problem Relation Age of Onset  . Colon cancer Neg Hx   . Colitis Father     crohn's  . Heart disease Father     grandfather  . Heart attack Father 73  . Diabetes      grandfather  . Irritable bowel syndrome    . Kidney disease      grandfather  . Alcohol abuse Other   . Arthritis Other   . Cancer Other     lung  . Hyperlipidemia Other   . Mental illness Other   . Other Mother     neurological autoimmune disease  . Emphysema Maternal Uncle     Social History:  reports that she has never smoked. She has never used smokeless tobacco. She reports that  drinks alcohol. She reports that she does not use illicit drugs.  Allergies:  Allergies  Allergen Reactions  . Adhesive (Tape) Dermatitis    Band- Aid adhesive  . Carbamazepine     Hallucinations and dizziness    Prescriptions prior  to admission  Medication Sig Dispense Refill  . Ascorbic Acid (VITAMIN C WITH ROSE HIPS) 500 MG tablet Take 500 mg by mouth daily.      Marland Kitchen aspirin-acetaminophen-caffeine (EXCEDRIN MIGRAINE) 250-250-65 MG per tablet Take 2 tablets by mouth every 6 (six) hours as needed for pain.       . Calcium Carbonate-Vitamin D (CALTRATE 600+D) 600-400 MG-UNIT per tablet Take 2 tablets by mouth daily.       . cyanocobalamin 1000 MCG tablet Take 1,000 mcg by mouth daily.      Marland Kitchen esomeprazole (NEXIUM) 40 MG capsule Take 40 mg by mouth daily.      . folic acid (FOLVITE) 400 MCG tablet Take 400 mcg by mouth daily.      . Magnesium 250 MG TABS Take 1 tablet by mouth daily.      . Multiple Vitamins-Calcium (ONE-A-DAY WOMENS FORMULA) TABS Take 1 tablet by mouth daily.      . Zinc 50 MG TABS Take 1 tablet by mouth daily.        ROS  Blood pressure 105/82, pulse 80, temperature 97.9 F (36.6  C), temperature source Oral, resp. rate 18, height 5\' 9"  (1.753 m), weight 275 lb (124.739 kg), SpO2 99.00%. Physical Exam Pt in NAD Lungs: CTA CV: RRR Abd: obese, NT, ND multiple keloids from cholecystectomy and mole removal  Results for orders placed during the hospital encounter of 09/13/12 (from the past 24 hour(s))  TYPE AND SCREEN     Status: None   Collection Time    09/13/12 10:31 AM      Result Value Range   ABO/RH(D) O POS     Antibody Screen NEG     Sample Expiration 09/16/2012    PREGNANCY, URINE     Status: None   Collection Time    09/13/12 11:01 AM      Result Value Range   Preg Test, Ur NEGATIVE  NEGATIVE    No results found.  Assessment/Plan:Symptomatic uterine fibroids   Pt for LAVH with bilateral salpingectomy for the above indications. Informed consent obtained in ofc and repeated in preop assessment area.  HARRAWAY-SMITH, Earnest Mcgillis 09/13/2012, 12:27 PM

## 2012-09-13 NOTE — Op Note (Signed)
09/13/2012  5:06 PM  PATIENT:  [name]  42 y.o. female  PRE-OPERATIVE DIAGNOSIS:  CPT [phone] Menorrhagia and uterine fibroids  POST-OPERATIVE DIAGNOSIS:  Menorrhagia and uterine fibroids  PROCEDURE:  Procedure(s): LAPAROSCOPIC ASSISTED VAGINAL HYSTERECTOMY (N/A) BILATERAL SALPINGECTOMY (Bilateral)  SURGEON:  Surgeon(s) and Role:    * Willodean Rosenthal, MD - Primary    * Catalina Antigua, MD - Assisting  ANESTHESIA:   general  EBL:  Total I/O In: 3500 [I.V.:3500] Out: 1525 [Urine:300; Blood:1225]  BLOOD ADMINISTERED:none  DRAINS: Urinary Catheter (Foley)   LOCAL MEDICATIONS USED:  MARCAINE     SPECIMEN:  Source of Specimen:  uterus and bilateral fallopian tubes   DISPOSITION OF SPECIMEN:  PATHOLOGY  COUNTS:  YES  TOURNIQUET:  * No tourniquets in log *  DICTATION: .Note written in EPIC  PLAN OF CARE: Admit for overnight observation  PATIENT DISPOSITION:  PACU - hemodynamically stable.   Delay start of Pharmacological VTE agent (>24hrs) due to surgical blood loss or risk of bleeding: yes   INDICATIONS: 42yo female G0P0 with aforementioned preoprative diagnoses here today for definitive surgical management.   Risks of surgery were discussed with the patient including but not limited to: bleeding which may require transfusion or reoperation; infection which may require antibiotics; injury to bowel, bladder, ureters or other surrounding organs; need for additional procedures including laparotomy; thromboembolic phenomenon, incisional problems and other postoperative/anesthesia complications. Written informed consent was obtained.    FINDINGS: 16 week sized uterus, normal adnexa bilaterally.    PROCEDURE IN DETAIL:  The patient received intravenous antibiotics and had sequential compression devices applied to her lower extremities while in the preoperative area.  She was then taken to the operating room where general anesthesia was administered and was found to be  adequate.  She was placed in the dorsal lithotomy position, and was prepped and draped in a sterile manner.  A Foley catheter was inserted into her bladder and attached to constant drainage and a HUMI uterine manipulator was then advanced into the uterus .  After an adequate timeout was performed, attention was then turned to the patient's abdomen where a 5-mm skin incision was made in the umbilicus. A verrse needle was inserted through the incision and a pneumoperitoneum was obtained with CO2 gas.  A trocar was then placed through the incision.   A survey of the patient's pelvis and abdomen revealed the above anatomy.   Bilateral 5-mm lower quadrant ports  were then placed under direct visualization.  The pelvis was then carefully examined.   On the right side, the round ligament was then clamped and transected with the Harmonic scapel device.  The uteroovarian ligament was also clamped and transected, and the fallopian tube on the right  was freed from the underlying mesosalpinx.  Excellent hemostasis was noted.  The broad ligament was serially clamped using the Harmonic to the level of the uterine artieries. Excellent hemostasis was noted.  The same was repeated on the left side.  There was extensive bleeding from the left uterine artery requiring placement of surgical clips.  Once the bleeding was controlled  the decision was made to leave the trocars in place and proceed with completing the hysterectomy via the vaginal route .  Attention was then turned to her pelvis.  A weighted speculum was then placed in the vagina, and the anterior and posterior lips of the cervix were grasped bilaterally with Gerilyn Pilgrim tenaculums.  The cervix was then injected circumferentially with dilute vasopressin solution.  The cervix was then circumferentially incised, and the posterior cul-de-sac was entered sharply without difficulty and a retractor was placed.   A long weighted speculum was inserted into the posterior cul-de-sac.   The Heaney clamp was then used to clamp the cardinal ligaments on either side.  They were then cut and sutured ligated with 0 Vicryl, and were held with a tag for later identification. Of note, all sutures used in this case were 0 Vicryl unless otherwise noted.   The uterosacral ligaments were then clamped, cut and ligated bilaterally. At this point the anterior cul-de-sac was entered sharply without difficulty. The uterine vessels and broad ligaments were then serially clamped with the Heaney clamps, cut, and suture ligated on both sides.  The uterus was noted to be freed from all ligaments.  Due to the large size of the uterus, it was morcellated from the vagina using a coring technique.  It was then delivered and sent to pathology.  Excellent hemostasis was noted at this point.   After completion of the hysterectomy, all pedicles from the uterosacral ligament to the cornua were examined hemostasis was confirmed.    The vaginal cuff was then closed in a running locked fashion with 0 Vicryl.  All instruments were then removed from the pelvis.  Indigo carmine was given by anesthesia.  The bladder was found to have no leakage.  Attention was then returned to her abdomen which was insufflated again with carbon dioxide gas.  The laparoscope was used to survey the operative site, and it was found to be hemostatic.   No intraoperative injury to other surrounding organs was noted. Flo seal was placed over the vaginal cuff and the abdomen was desufflated and all instruments were then removed from the patient's abdomen. The port sites were repaired with 4-0 vicryl and all of the sites were injected with 0.5% Marcaine.  Benzoin and steri strips were applied. The patient tolerated the procedures well.  All instruments, needles, and sponge counts were correct x 2. The patient was taken to the recovery room awake, extubated and in stable condition.

## 2012-09-13 NOTE — Progress Notes (Signed)
Called and updated Dr. Penne Lash... Pt complaining of pain 10/10 and requesting catheter be taken out immediately due to severe pressure. Dr. Penne Lash stated to encourage pt to leave catheter in. Updated Dr. Penne Lash on output, stated to keep a close watch on intake and output and vital signs. Orders given for CBC stat, Xanax, and Urispas. Dr. Penne Lash stated it was okay to give pt Toradol. Dr. Penne Lash stated she wanted to be called with CBC results if Hemoglobin is less than 10. Explained the orders to the pt and she agrees. Will continue to monitor the pt.

## 2012-09-13 NOTE — Transfer of Care (Signed)
Immediate Anesthesia Transfer of Care Note  Patient: [name]  Procedure(s) Performed: Procedure(s): LAPAROSCOPIC ASSISTED VAGINAL HYSTERECTOMY (N/A) BILATERAL SALPINGECTOMY (Bilateral)  Patient Location: PACU  Anesthesia Type:General  Level of Consciousness: awake, alert  and oriented  Airway & Oxygen Therapy: Patient Spontanous Breathing and Patient connected to nasal cannula oxygen  Post-op Assessment: Report given to PACU RN and Post -op Vital signs reviewed and stable  Post vital signs: Reviewed and stable  Complications: No apparent anesthesia complications

## 2012-09-13 NOTE — Anesthesia Postprocedure Evaluation (Signed)
Anesthesia Post Note  Patient: [name]  Procedure(s) Performed: Procedure(s) (LRB): LAPAROSCOPIC ASSISTED VAGINAL HYSTERECTOMY (N/A) BILATERAL SALPINGECTOMY (Bilateral)  Anesthesia type: General  Patient location: PACU  Post pain: Pain level controlled  Post assessment: Post-op Vital signs reviewed  Last Vitals:  Filed Vitals:   09/13/12 1700  BP: 95/51  Pulse: 72  Temp: 37.2 C  Resp: 14    Post vital signs: Reviewed  Level of consciousness: sedated  Complications: No apparent anesthesia complications

## 2012-09-14 ENCOUNTER — Encounter (HOSPITAL_COMMUNITY): Payer: Self-pay | Admitting: Obstetrics & Gynecology

## 2012-09-14 LAB — CBC
HCT: 25.2 % — ABNORMAL LOW (ref 36.0–46.0)
Hemoglobin: 8.6 g/dL — ABNORMAL LOW (ref 12.0–15.0)
RBC: 2.74 MIL/uL — ABNORMAL LOW (ref 3.87–5.11)
WBC: 18.3 10*3/uL — ABNORMAL HIGH (ref 4.0–10.5)

## 2012-09-14 MED ORDER — IBUPROFEN 600 MG PO TABS: 600.0000 mg | ORAL_TABLET | Freq: Four times a day (QID) | ORAL | Status: DC | PRN

## 2012-09-14 MED ORDER — OXYCODONE-ACETAMINOPHEN 5-325 MG PO TABS: 1.0000 | ORAL_TABLET | ORAL | Status: DC | PRN

## 2012-09-14 NOTE — Progress Notes (Signed)
CSW referral received to address "financial concerns."  Pt told CSW that she received "financial aid" from the hospital.  She does not have insurance.  She was concerned about purchasing prescriptions but states she is able to get prescription filled.  Pt does not identify any other needs this CSW can assist with at this time.

## 2012-09-14 NOTE — Anesthesia Postprocedure Evaluation (Signed)
  Anesthesia Post-op Note  Patient: [name]  Procedure(s) Performed: Procedure(s): LAPAROSCOPIC ASSISTED VAGINAL HYSTERECTOMY (N/A) BILATERAL SALPINGECTOMY (Bilateral)  Patient Location: [facility]  Anesthesia Type:General  Level of Consciousness: awake, alert  and oriented  Airway and Oxygen Therapy: Patient Spontanous Breathing  Post-op Pain: moderate  Post-op Assessment: Patient's Cardiovascular Status Stable, Respiratory Function Stable and No signs of Nausea or vomiting  Post-op Vital Signs: stable  Complications: No apparent anesthesia complications

## 2012-09-14 NOTE — Discharge Summary (Signed)
Physician Discharge Summary  Patient ID: SYMIA HERDT MRN: [id number] DOB/AGE: 43-Nov-1972 43 y.o.  Admit date: 09/13/2012 Discharge date: 09/14/2012  Admission Diagnoses: menorrhagia, uterine fibroids, pelvic pain  Discharge Diagnoses: same Active Problems:   * No active hospital problems. *   Discharged Condition: good  Hospital Course: Pt was admitted yesterday after an uncomplicated LAVH with bilateral salpingectomy.  She had increased pain overnight due to 'gas' which has resolved.  She reports only abdominal soreness at present.  She has passed no flatus but, had no nausea or emesis overnight.   Consults: None  Significant Diagnostic Studies: labs:  CBC    Component Value Date/Time   WBC 18.3* 09/14/2012 0535   WBC 7.5 08/24/2012 0917   RBC 2.74* 09/14/2012 0535   RBC 4.85 08/24/2012 0917   HGB 8.6* 09/14/2012 0535   HGB 15.1 08/24/2012 0917   HCT 25.2* 09/14/2012 0535   HCT 44.5 08/24/2012 0917   PLT 207 09/14/2012 0535   PLT 232 08/24/2012 0917   MCV 92.0 09/14/2012 0535   MCV 91.8 08/24/2012 0917   MCH 31.4 09/14/2012 0535   MCH 31.1 08/24/2012 0917   MCHC 34.1 09/14/2012 0535   MCHC 33.9 08/24/2012 0917   RDW 12.5 09/14/2012 0535   RDW 12.6 08/24/2012 0917   LYMPHSABS 1.8 08/24/2012 0917   LYMPHSABS 1.5 09/06/2010 0550   MONOABS 0.5 08/24/2012 0917   MONOABS 0.7 09/06/2010 0550   EOSABS 0.1 08/24/2012 0917   EOSABS 0.1 09/06/2010 0550   BASOSABS 0.0 08/24/2012 0917   BASOSABS 0.0 09/06/2010 0550      Treatments: IV hydration, analgesia: acetaminophen w/ codeine and Morphine and surgery: laparoscopic assisted vaginal hysterectomy with bilateral salpingectomy  Discharge Exam: Blood pressure 101/52, pulse 96, temperature 98.5 F (36.9 C), temperature source Oral, resp. rate 20, height 5\' 9"  (1.753 m), weight 124.739 kg (275 lb), SpO2 96.00%. General appearance: alert and no distress Resp: clear to auscultation bilaterally Cardio: regular rate and rhythm, S1, S2 normal, no murmur, click,  rub or gallop GI: soft, non-tender; bowel sounds normal; no masses,  no organomegaly Extremities: extremities normal, atraumatic, no cyanosis or edema Incision/Wound:port sites clean, dry and intact Disposition: 01-Home or Self Care  Discharge Orders   Future Appointments Provider Department Dept Phone   09/25/2012 1:00 PM Cleophas Dunker, LMFT [facility] [place] [phone]   10/02/2012 1:00 PM Cleophas Dunker, LMFT [facility] [place] [phone]   10/05/2012 9:00 AM Willodean Rosenthal, MD [facility] [phone]   10/09/2012 1:00 PM Cleophas Dunker, LMFT [facility] [place] [phone]   11/12/2012 9:00 AM Bradd Canary, MD  [facility] at  [facility] [phone]   Future Orders Complete By Expires     Call MD for:  difficulty breathing, headache or visual disturbances  As directed     Call MD for:  persistant dizziness or light-headedness  As directed     Call MD for:  persistant nausea and vomiting  As directed     Call MD for:  redness, tenderness, or signs of infection (pain, swelling, redness, odor or green/yellow discharge around incision site)  As directed     Call MD for:  severe uncontrolled pain  As directed     Call MD for:  temperature >100.4  As directed     Diet - low sodium heart healthy  As directed     Driving Restrictions  As directed     Comments:  For 1 week and not until off ALL pain meds    Increase activity slowly  As directed     Lifting restrictions  As directed     Comments:      Nothing over 20# for 2 weeks    Remove dressing in 48 hours  As directed     Sexual Activity Restrictions  As directed     Comments:      Not in vagina for 6 weeks        Medication List    TAKE these medications       aspirin-acetaminophen-caffeine 250-250-65 MG per tablet  Commonly known as:  EXCEDRIN MIGRAINE  Take 2 tablets by mouth every 6 (six) hours as needed for  pain.     CALTRATE 600+D 600-400 MG-UNIT per tablet  Generic drug:  Calcium Carbonate-Vitamin D  Take 2 tablets by mouth daily.     cyanocobalamin 1000 MCG tablet  Take 1,000 mcg by mouth daily.     esomeprazole 40 MG capsule  Commonly known as:  NEXIUM  Take 40 mg by mouth daily.     folic acid 400 MCG tablet  Commonly known as:  FOLVITE  Take 400 mcg by mouth daily.     ibuprofen 600 MG tablet  Commonly known as:  ADVIL,MOTRIN  Take 1 tablet (600 mg total) by mouth every 6 (six) hours as needed for pain.     Magnesium 250 MG Tabs  Take 1 tablet by mouth daily.     One-A-Day Womens Formula Tabs  Take 1 tablet by mouth daily.     oxyCODONE-acetaminophen 5-325 MG per tablet  Commonly known as:  PERCOCET/ROXICET  Take 1-2 tablets by mouth every 4 (four) hours as needed.     vitamin C with rose hips 500 MG tablet  Take 500 mg by mouth daily.     Zinc 50 MG Tabs  Take 1 tablet by mouth daily.           Follow-up Information   Follow up with Willodean Rosenthal, MD In 2 weeks.   Contact information:   [address] [place] [address] [phone]      For discharge to home once she is able to void and tolerate a regular diet Signed: HARRAWAY-SMITH, Yolinda Duerr 09/14/2012, 10:15 AM

## 2012-09-14 NOTE — Progress Notes (Signed)
Pt d/c teaching complete  Ambulated out 

## 2012-09-14 NOTE — Progress Notes (Signed)
UR completed 

## 2012-09-17 LAB — TYPE AND SCREEN
ABO/RH(D): O POS
Antibody Screen: NEGATIVE
Unit division: 0

## 2012-09-18 ENCOUNTER — Encounter: Payer: Self-pay | Admitting: *Deleted

## 2012-09-18 ENCOUNTER — Ambulatory Visit (HOSPITAL_COMMUNITY): Payer: No Typology Code available for payment source | Admitting: Marriage and Family Therapist

## 2012-09-25 ENCOUNTER — Ambulatory Visit (INDEPENDENT_AMBULATORY_CARE_PROVIDER_SITE_OTHER): Payer: No Typology Code available for payment source | Admitting: Marriage and Family Therapist

## 2012-09-25 DIAGNOSIS — F411 Generalized anxiety disorder: Secondary | ICD-10-CM

## 2012-09-25 DIAGNOSIS — F329 Major depressive disorder, single episode, unspecified: Secondary | ICD-10-CM

## 2012-09-25 NOTE — Progress Notes (Signed)
   THERAPIST PROGRESS NOTE  Session Time: 1:00 - 2:00 p.m.  Participation Level: Active  Behavioral Response: CasualAlertAnxious and Depressed  Type of Therapy: Individual Therapy  Treatment Goals addressed: Coping  Interventions: Strength-based and Supportive  Summary: [name] is a 42 y.o. female who presents with depression and anxiety.  She was referred by Dr. Lolly Mustache..  Patient reported her experience having a hysterectomy.  She reported having a "football-sized tumor" on her uterus.  She does not know the results of the tumor as of yet.  She reports feeling "very fatigued every day."  She also states she was given opiates for the pain but only ended up taking one pill.  She talked about coming home and having to help her mother and grandmothers e.g., five days after the surgery she reports her mother asked to drive to the store.  Patient states there was food in the house and she believes she should not have driven so close to her surgery.  She reports the family has "acted as if I didn't have surgery."    Suicidal/Homicidal: Nowithout intent/plan  Therapist Response: Discussed at length patient finding ways to balance her life right now in lieu of having major surgery.  Discussed patient not being assertive enough because she admitted she does not know how since "they have all the control over my schedule."  Discussed ways patient could say "no" e.g., patient could tell her mother she is still fatigued from the surgery and may or may not be able to help mother with errands or driving mother.  Gave this homework assignment to patient.  She also reviewed and signed her treatment plan.  She did not want a copy.  Plan: Return again in 1 weeks.  Diagnosis: Axis I: Depression NOS; GAD    Axis II: Deferred    TOMAR,JOANIE, LMFT, CTS  09/25/2012

## 2012-09-27 ENCOUNTER — Telehealth: Payer: Self-pay | Admitting: Internal Medicine

## 2012-10-01 ENCOUNTER — Telehealth: Payer: Self-pay | Admitting: Internal Medicine

## 2012-10-02 ENCOUNTER — Ambulatory Visit (HOSPITAL_COMMUNITY): Payer: No Typology Code available for payment source | Admitting: Marriage and Family Therapist

## 2012-10-05 ENCOUNTER — Ambulatory Visit (INDEPENDENT_AMBULATORY_CARE_PROVIDER_SITE_OTHER): Payer: No Typology Code available for payment source | Admitting: Obstetrics & Gynecology

## 2012-10-05 ENCOUNTER — Encounter: Payer: Self-pay | Admitting: Obstetrics & Gynecology

## 2012-10-05 VITALS — BP 132/78 | HR 96 | Ht 69.0 in | Wt 272.3 lb

## 2012-10-05 DIAGNOSIS — Z09 Encounter for follow-up examination after completed treatment for conditions other than malignant neoplasm: Secondary | ICD-10-CM

## 2012-10-05 DIAGNOSIS — Z9889 Other specified postprocedural states: Secondary | ICD-10-CM

## 2012-10-05 NOTE — Progress Notes (Signed)
Subjective:     Patient ID: [name], female   DOB: 1971/04/03, 42 y.o.   MRN: [id number]  HPI  Pt presents for post op check had LAVH with bilateral salpingectomy 09/14/2012. Pt without complaints.  She is voiding and stooling without difficulty.  She has had no pain since surgery.  No abnormal vaginal discahrge.       Review of Systems     Objective:   Physical Exam BP 132/78  Pulse 96  Ht 5\' 9"  (1.753 m)  Wt 272 lb 4.8 oz (123.514 kg)  BMI 40.19 kg/m2  LMP 08/13/2012  Lungs: CTA CV: RRR Abd: obese, NT, ND, port sites healing well  Diagnosis Uterus and bilateral fallopian tubes - BENIGN UTERINE LEIOMYOMA (9.5 CM) - BENIGN SECRETORY PHASE ENDOMETRIUM. - UNREMARKABLE UTERINE SEROSA. - BENIGN CERVICAL MUCOSA; NEGATIVE FOR INTRAEPITHELIAL LESION OR MALIGNANCY. - BENIGN BILATERAL FALLOPIAN TUBES; NEGATIVE FOR ATYPIA OR MALIGNANCY.     Assessment:     3 week post op check.  Pt doing well      Plan:     F/u 3 weeks or sooner prn Gradual increase of activity Nothing per vagina

## 2012-10-05 NOTE — Patient Instructions (Signed)
Hysterectomy  Care After  Refer to this sheet in the next few weeks. These instructions provide you with information on caring for yourself after your procedure. Your caregiver may also give you more specific instructions. Your treatment has been planned according to current medical practices, but problems sometimes occur. Call your caregiver if you have any problems or questions after your procedure.  HOME CARE INSTRUCTIONS   Healing will take time. You may have discomfort, tenderness, swelling, and bruising at the surgical site for about 2 weeks. This is normal and will get better as time goes on.   Only take over-the-counter or prescription medicines for pain, discomfort, or fever as directed by your caregiver.   Do not take aspirin. It can cause bleeding.   Do not drive when taking pain medicine.   Follow your caregiver's advice regarding exercise, lifting, driving, and general activities.   Resume your usual diet as directed and allowed.   Get plenty of rest and sleep.   Do not douche, use tampons, or have sexual intercourse for at least 6 weeks or until your caregiver gives you permission.   Change your bandages (dressings) as directed by your caregiver.   Monitor your temperature.   Take showers instead of baths for 2 to 3 weeks.   Do not drink alcohol until your caregiver gives you permission.   If you are constipated, you may take a mild laxative with your caregiver's permission. Bran foods may help with constipation problems. Drinking enough fluids to keep your urine clear or pale yellow may help as well.   Try to have someone home with you for 1 or 2 weeks to help around the house.   Keep all of your follow-up appointments as directed by your caregiver.  SEEK MEDICAL CARE IF:    You have swelling, redness, or increasing pain in the surgical cut (incision) area.   You have pus coming from the incision.   You notice a bad smell coming from the incision or dressing.   You have swelling,  redness, or pain around the intravenous (IV) site.   Your incision breaks open.   You feel dizzy or lightheaded.   You have pain or bleeding when you urinate.   You have persistent diarrhea.   You have persistent nausea and vomiting.   You have abnormal vaginal discharge.   You have a rash.   You have any type of abnormal reaction or develop an allergy to your medicine.   Your pain is not controlled with your prescribed medicine.  SEEK IMMEDIATE MEDICAL CARE IF:    You have a fever.   You have severe abdominal pain.   You have chest pain.   You have shortness of breath.   You faint.   You have pain, swelling, or redness of your leg.   You have heavy vaginal bleeding with blood clots.  MAKE SURE YOU:   Understand these instructions.   Will watch your condition.   Will get help right away if you are not doing well or get worse.  Document Released: 01/21/2005 Document Revised: 09/26/2011 Document Reviewed: 02/18/2011  [employer] Patient Information 2013 [employer]

## 2012-10-09 ENCOUNTER — Ambulatory Visit (INDEPENDENT_AMBULATORY_CARE_PROVIDER_SITE_OTHER): Payer: No Typology Code available for payment source | Admitting: Marriage and Family Therapist

## 2012-10-09 DIAGNOSIS — F329 Major depressive disorder, single episode, unspecified: Secondary | ICD-10-CM

## 2012-10-09 DIAGNOSIS — F411 Generalized anxiety disorder: Secondary | ICD-10-CM

## 2012-10-10 ENCOUNTER — Telehealth: Payer: Self-pay

## 2012-10-10 NOTE — Telephone Encounter (Signed)
Pt called and stated that she had surgery with Dr. Erin Fulling and the white stitches looks like there coming out.  "What do I need to do about that?"   Called pt and informed her that the stitches should dissolve but if she notices that some have worked themselves out instead of dissolving that she could just let come out on its own.  I advised her that on her appt scheduled for 10/25/12 if there is any stitches that haven't dissolved the provider will determine rather or not they can be pulled.  Pt stated understanding and did not have any other questions.

## 2012-10-10 NOTE — Progress Notes (Signed)
   THERAPIST PROGRESS NOTE  Session Time:  1:00 - 2:00 p.m.  Participation Level: Active  Behavioral Response: CasualAlertDepressed  Type of Therapy: Individual Therapy  Treatment Goals addressed: Coping  Interventions: Strength-based and Supportive, assertive training  Summary: [name] is a 41 y.o. female who presents with depression and anxiety.  She was referred by Dr. Lolly Mustache.  Patient continues to reports how she is coping with taking care of her mother and grandmothers.  She reports she feels like "I never visit my grandmothers, I'm only there do do things."  She reports her mother is "passive-aggressive" (her words) toward patient e.g., talks to the cat "I really wish someone would get me a cup of coffee."  Patient states in turn she has started responding in the same way around getting a break due to having surgery.  She reports she cannot be direct with her mother because she believes her mother will "ask her to leave and she has no where to go.  She reports yesterday seeing her surgeon who told her she still should not be lifting despite patient doing the grocery shopping and laundry a week after her surgery.  She also talked about whether or not to go to school or work and patient is focusing more in school.  She reports she got the workbook "Finding [employer] by Georgeann Oppenheim a book about choosing a career but that she has not started working on it.  Suicidal/Homicidal: NA  Therapist Response:  Spent most of the session discussing ways patient can feel more empowered particularly with her mother.  Pointed out that patient has fears that she will be thrown out by her mother if she speaks up however she is the one who has the power since her mother needs patient as a caretaker.  Discussed patient's life being about not feeling important to her family and that this is a them to be broken.  Also discussed patient moving forward in her life.  She will start the workbook and bring  in for the next session.  Plan: Return again in 1 weeks.  Diagnosis: Axis I: Depression NOS; GAD    Axis II: Deferred    Adelynn Gipe, LMFT, CTS 10/10/2012

## 2012-10-15 NOTE — Telephone Encounter (Signed)
See previous note

## 2012-10-15 NOTE — Telephone Encounter (Signed)
See note

## 2012-10-23 ENCOUNTER — Encounter: Payer: Self-pay | Admitting: Internal Medicine

## 2012-10-24 ENCOUNTER — Ambulatory Visit (INDEPENDENT_AMBULATORY_CARE_PROVIDER_SITE_OTHER): Payer: No Typology Code available for payment source | Admitting: Marriage and Family Therapist

## 2012-10-24 DIAGNOSIS — F329 Major depressive disorder, single episode, unspecified: Secondary | ICD-10-CM

## 2012-10-24 DIAGNOSIS — F411 Generalized anxiety disorder: Secondary | ICD-10-CM

## 2012-10-24 NOTE — Progress Notes (Signed)
   THERAPIST PROGRESS NOTE  Session Time: 1:00 - 2:00 a.m.  Participation Level: Active  Behavioral Response: CasualAlertAnxious/Mild depression  Type of Therapy: Individual Therapy  Treatment Goals addressed: Coping  Interventions: Strength-based, Assertiveness Training and Supportive  Summary: [name] is a 42 y.o. female who presents with depression and anxiety.  She was referred by Dr. Lolly Mustache.  Patient talked about how she is interacting with her mother and grandmothers relating to feeling unappreciated.  She admits there are times when she has had enough she expresses how she feels.  For example, patient was visiting her mother's mother and told her she would be having a hysterectomy.  She said her grandmother told patient she was concerned about who would be taking care of patient's mother if patient was unable to because of the surgery.  Patient was assertive stating she was more important at the time and asked who would be taking care of her.  She gave other examples as well.  Patient also talked about reading the recommended book (see other notes) relating to careers and education.  She admits that the book has been difficult for her to read and complete because emotions are surfacing as a result.  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 [employer] program.  She does not think she can get in due to the limit of students allowed.  Suicidal/Homicidal: No  Therapist Response: Discussed at length patient getting her needs met within her family.  Specifically talked about ways patient could be assertive towards her family.  Discussed patient having difficulty reading the book and patient did say she wants to continue to use the book but with therapy.  Talked about using the book in bits and not altogether.  Strongly urged patient to contact [employer] for an interview to ask questions and only take one class in the Fall to get accustomed to going.  Plan:  Return again in 1 weeks.  Diagnosis: Axis I: Depressive d/o NOS; GAD    Axis II: Deferred    Madge Therrien, LMFT, GAD 10/24/2012

## 2012-10-25 ENCOUNTER — Ambulatory Visit (INDEPENDENT_AMBULATORY_CARE_PROVIDER_SITE_OTHER): Payer: No Typology Code available for payment source | Admitting: Obstetrics & Gynecology

## 2012-10-25 ENCOUNTER — Encounter: Payer: Self-pay | Admitting: Obstetrics & Gynecology

## 2012-10-25 VITALS — BP 132/82 | HR 84 | Temp 97.3°F | Ht 69.0 in | Wt 273.7 lb

## 2012-10-25 DIAGNOSIS — Z9889 Other specified postprocedural states: Secondary | ICD-10-CM

## 2012-10-25 DIAGNOSIS — Z09 Encounter for follow-up examination after completed treatment for conditions other than malignant neoplasm: Secondary | ICD-10-CM

## 2012-10-25 NOTE — Progress Notes (Signed)
Subjective:     Patient ID: [name], female   DOB: [birth year], 42 y.o.   MRN: [id number]  HPI Pt presents for 6 weeks post op check. Pt without complaints.  She is off all pain meds- she only took meds the night of surgery.  Pt reports that she is back to full activity.   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 - UNREMARKABLE UTERINE SEROSA. - BENIGN CERVICAL MUCOSA; NEGATIVE FOR INTRAEPITHELIAL LESION OR MALIGNANCY. - BENIGN BILATERAL FALLOPIAN TUBES; NEGATIVE FOR ATYPIA OR MALIGNANCY     Assessment:     6 weeks post op check- pt doing well      Plan:     F/u in 3 months or sooner prn pics of post op specimen given to pt   May be sexually active in 2 weeks until then, keep NOTHING per vagina

## 2012-10-25 NOTE — Patient Instructions (Addendum)
Hysterectomy  Care After  Refer to this sheet in the next few weeks. These instructions provide you with information on caring for yourself after your procedure. Your caregiver may also give you more specific instructions. Your treatment has been planned according to current medical practices, but problems sometimes occur. Call your caregiver if you have any problems or questions after your procedure.  HOME CARE INSTRUCTIONS   Healing will take time. You may have discomfort, tenderness, swelling, and bruising at the surgical site for about 2 weeks. This is normal and will get better as time goes on.   Only take over-the-counter or prescription medicines for pain, discomfort, or fever as directed by your caregiver.   Do not take aspirin. It can cause bleeding.   Do not drive when taking pain medicine.   Follow your caregiver's advice regarding exercise, lifting, driving, and general activities.   Resume your usual diet as directed and allowed.   Get plenty of rest and sleep.   Do not douche, use tampons, or have sexual intercourse for at least 6 weeks or until your caregiver gives you permission.   Change your bandages (dressings) as directed by your caregiver.   Monitor your temperature.   Take showers instead of baths for 2 to 3 weeks.   Do not drink alcohol until your caregiver gives you permission.   If you are constipated, you may take a mild laxative with your caregiver's permission. Bran foods may help with constipation problems. Drinking enough fluids to keep your urine clear or pale yellow may help as well.   Try to have someone home with you for 1 or 2 weeks to help around the house.   Keep all of your follow-up appointments as directed by your caregiver.  SEEK MEDICAL CARE IF:    You have swelling, redness, or increasing pain in the surgical cut (incision) area.   You have pus coming from the incision.   You notice a bad smell coming from the incision or dressing.   You have swelling,  redness, or pain around the intravenous (IV) site.   Your incision breaks open.   You feel dizzy or lightheaded.   You have pain or bleeding when you urinate.   You have persistent diarrhea.   You have persistent nausea and vomiting.   You have abnormal vaginal discharge.   You have a rash.   You have any type of abnormal reaction or develop an allergy to your medicine.   Your pain is not controlled with your prescribed medicine.  SEEK IMMEDIATE MEDICAL CARE IF:    You have a fever.   You have severe abdominal pain.   You have chest pain.   You have shortness of breath.   You faint.   You have pain, swelling, or redness of your leg.   You have heavy vaginal bleeding with blood clots.  MAKE SURE YOU:   Understand these instructions.   Will watch your condition.   Will get help right away if you are not doing well or get worse.  Document Released: 01/21/2005 Document Revised: 09/26/2011 Document Reviewed: 02/18/2011  [employer] Patient Information 2013 [employer]

## 2012-10-30 ENCOUNTER — Telehealth: Payer: Self-pay | Admitting: Internal Medicine

## 2012-10-31 ENCOUNTER — Ambulatory Visit (HOSPITAL_COMMUNITY): Payer: Self-pay | Admitting: Marriage and Family Therapist

## 2012-11-12 ENCOUNTER — Encounter: Payer: No Typology Code available for payment source | Admitting: Family Medicine

## 2012-11-20 ENCOUNTER — Ambulatory Visit (INDEPENDENT_AMBULATORY_CARE_PROVIDER_SITE_OTHER): Payer: No Typology Code available for payment source | Admitting: Marriage and Family Therapist

## 2012-11-20 DIAGNOSIS — F411 Generalized anxiety disorder: Secondary | ICD-10-CM

## 2012-11-20 DIAGNOSIS — F329 Major depressive disorder, single episode, unspecified: Secondary | ICD-10-CM

## 2012-11-20 NOTE — Progress Notes (Signed)
   THERAPIST PROGRESS NOTE  Session Time:  11:00 - Noon  Participation Level: Active  Behavioral Response: CasualAlertAnxious and Depressed  Type of Therapy: Individual Therapy  Treatment Goals addressed: Coping  Interventions: Strength-based and Supportive  Summary: [name] is a 42 y.o. female who presents with depression and anxiety.  She was referred by Dr. Lolly Mustache.   Patient first came in and showed her calendar for the next two months.  She reports with the exception of coming here to therapy, every day is booked taking care of her two grandmothers and her mother.  Patient states she is dreading her mother finally retiring from work the end of June, because she will have no time to herself.  Patient also states she is not sleeping and that "it is getting worse."  She reports having trouble getting to sleep and staying asleep.  She says it is very difficult to get up in the morning and that she is "very tired" throughout the day.  Patient admits there have been a few times when she takes one of her mother's Xanax just to get sleep and even with taking Xanax she has difficulty falling asleep.  She states her life is not moving forwards and all she is doing is for other people.  Patient states she has no one to talk to who "does not want something from me."  Patient states she is not thinking about college or work because either would make her life more difficult.  Suicidal/Homicidal: Nowithout intent/plan  Therapist Response:  Assessed patient's depression and anxiety.  Evidenced by self-report and body language during the session, patient appears to have moderately depressed and anxious.  She is expressing more anxiety relating to her life not moving forward and having little time to herself.  Suggested that she take small pockets of time during the day for self-care.  Also suggested patient consider attending caretaker support groups.    Plan: Return again in 1  weeks.  Diagnosis: Axis I: Depressive d/o NOS; GAD    Axis II: Deferred    Quest Tavenner, LMFT, CTS 11/20/2012

## 2012-11-21 ENCOUNTER — Telehealth (HOSPITAL_COMMUNITY): Payer: Self-pay

## 2012-11-23 ENCOUNTER — Ambulatory Visit: Payer: Self-pay | Admitting: Family

## 2012-11-27 ENCOUNTER — Ambulatory Visit (HOSPITAL_COMMUNITY): Payer: Self-pay | Admitting: Marriage and Family Therapist

## 2012-12-11 ENCOUNTER — Other Ambulatory Visit: Payer: Self-pay | Admitting: Internal Medicine

## 2012-12-11 DIAGNOSIS — Z1231 Encounter for screening mammogram for malignant neoplasm of breast: Secondary | ICD-10-CM

## 2012-12-17 ENCOUNTER — Telehealth: Payer: Self-pay | Admitting: Internal Medicine

## 2012-12-17 NOTE — Telephone Encounter (Signed)
Lm on vm telling patient I was leaving Nexium samples up front along with a Nexium savings card for her to try.

## 2012-12-21 ENCOUNTER — Encounter: Payer: Self-pay | Admitting: Family Medicine

## 2012-12-21 ENCOUNTER — Ambulatory Visit (INDEPENDENT_AMBULATORY_CARE_PROVIDER_SITE_OTHER): Payer: No Typology Code available for payment source | Admitting: Family Medicine

## 2012-12-21 VITALS — BP 118/82 | HR 72 | Temp 98.2°F | Ht 69.0 in | Wt 279.0 lb

## 2012-12-21 DIAGNOSIS — R739 Hyperglycemia, unspecified: Secondary | ICD-10-CM

## 2012-12-21 DIAGNOSIS — Z0000 Encounter for general adult medical examination without abnormal findings: Secondary | ICD-10-CM

## 2012-12-21 DIAGNOSIS — K219 Gastro-esophageal reflux disease without esophagitis: Secondary | ICD-10-CM

## 2012-12-21 DIAGNOSIS — F32A Depression, unspecified: Secondary | ICD-10-CM

## 2012-12-21 DIAGNOSIS — F329 Major depressive disorder, single episode, unspecified: Secondary | ICD-10-CM

## 2012-12-21 DIAGNOSIS — E7800 Pure hypercholesterolemia, unspecified: Secondary | ICD-10-CM

## 2012-12-21 DIAGNOSIS — M2550 Pain in unspecified joint: Secondary | ICD-10-CM

## 2012-12-21 DIAGNOSIS — M542 Cervicalgia: Secondary | ICD-10-CM

## 2012-12-21 DIAGNOSIS — R7309 Other abnormal glucose: Secondary | ICD-10-CM

## 2012-12-21 DIAGNOSIS — R5381 Other malaise: Secondary | ICD-10-CM

## 2012-12-21 DIAGNOSIS — E781 Pure hyperglyceridemia: Secondary | ICD-10-CM

## 2012-12-21 DIAGNOSIS — R5383 Other fatigue: Secondary | ICD-10-CM

## 2012-12-21 DIAGNOSIS — D649 Anemia, unspecified: Secondary | ICD-10-CM

## 2012-12-21 DIAGNOSIS — F341 Dysthymic disorder: Secondary | ICD-10-CM

## 2012-12-21 DIAGNOSIS — D509 Iron deficiency anemia, unspecified: Secondary | ICD-10-CM

## 2012-12-21 LAB — CBC
HCT: 38.6 % (ref 36.0–46.0)
Hemoglobin: 13.7 g/dL (ref 12.0–15.0)
MCHC: 35.5 g/dL (ref 30.0–36.0)
MCV: 80.9 fL (ref 78.0–100.0)
RDW: 16.3 % — ABNORMAL HIGH (ref 11.5–15.5)

## 2012-12-21 LAB — LIPID PANEL
Cholesterol: 197 mg/dL (ref 0–200)
HDL: 38 mg/dL — ABNORMAL LOW (ref >39)
Total CHOL/HDL Ratio: 5.2 Ratio
Triglycerides: 231 mg/dL — ABNORMAL HIGH (ref <150)

## 2012-12-21 LAB — COMPREHENSIVE METABOLIC PANEL
AST: 13 U/L (ref 0–37)
BUN: 16 mg/dL (ref 6–23)
Calcium: 9.5 mg/dL (ref 8.4–10.5)
Chloride: 101 mEq/L (ref 96–112)
Creat: 0.69 mg/dL (ref 0.50–1.10)
Glucose, Bld: 86 mg/dL (ref 70–99)

## 2012-12-21 LAB — TSH: TSH: 1.942 u[IU]/mL (ref 0.350–4.500)

## 2012-12-21 MED ORDER — VENLAFAXINE HCL 50 MG PO TABS: 50.0000 mg | ORAL_TABLET | Freq: Three times a day (TID) | ORAL | Status: DC

## 2012-12-21 MED ORDER — DIAZEPAM 10 MG PO TABS: 10.0000 mg | ORAL_TABLET | Freq: Two times a day (BID) | ORAL | Status: DC | PRN

## 2012-12-21 NOTE — Patient Instructions (Addendum)
Krill oil caps, MegaRed caps daily  Preventive Care for Adults, Female A healthy lifestyle and preventive care can promote health and wellness. Preventive health guidelines for women include the following key practices.  A routine yearly physical is a good way to check with your caregiver about your health and preventive screening. It is a chance to share any concerns and updates on your health, and to receive a thorough exam.  Visit your dentist for a routine exam and preventive care every 6 months. Brush your teeth twice a day and floss once a day. Good oral hygiene prevents tooth decay and gum disease.  The frequency of eye exams is based on your age, health, family medical history, use of contact lenses, and other factors. Follow your caregiver's recommendations for frequency of eye exams.  Eat a healthy diet. Foods like vegetables, fruits, whole grains, low-fat dairy products, and lean protein foods contain the nutrients you need without too many calories. Decrease your intake of foods high in solid fats, added sugars, and salt. Eat the right amount of calories for you.Get information about a proper diet from your caregiver, if necessary.  Regular physical exercise is one of the most important things you can do for your health. Most adults should get at least 150 minutes of moderate-intensity exercise (any activity that increases your heart rate and causes you to sweat) each week. In addition, most adults need muscle-strengthening exercises on 2 or more days a week.  Maintain a healthy weight. The body mass index (BMI) is a screening tool to identify possible weight problems. It provides an estimate of body fat based on height and weight. Your caregiver can help determine your BMI, and can help you achieve or maintain a healthy weight.For adults 20 years and older:  A BMI below 18.5 is considered underweight.  A BMI of 18.5 to 24.9 is normal.  A BMI of 25 to 29.9 is considered  overweight.  A BMI of 30 and above is considered obese.  Maintain normal blood lipids and cholesterol levels by exercising and minimizing your intake of saturated fat. Eat a balanced diet with plenty of fruit and vegetables. Blood tests for lipids and cholesterol should begin at age 43 and be repeated every 5 years. If your lipid or cholesterol levels are high, you are over 50, or you are at high risk for heart disease, you may need your cholesterol levels checked more frequently.Ongoing high lipid and cholesterol levels should be treated with medicines if diet and exercise are not effective.  If you smoke, find out from your caregiver how to quit. If you do not use tobacco, do not start.  If you are pregnant, do not drink alcohol. If you are breastfeeding, be very cautious about drinking alcohol. If you are not pregnant and choose to drink alcohol, do not exceed 1 drink per day. One drink is considered to be 12 ounces (355 mL) of beer, 5 ounces (148 mL) of wine, or 1.5 ounces (44 mL) of liquor.  Avoid use of street drugs. Do not share needles with anyone. Ask for help if you need support or instructions about stopping the use of drugs.  High blood pressure causes heart disease and increases the risk of stroke. Your blood pressure should be checked at least every 1 to 2 years. Ongoing high blood pressure should be treated with medicines if weight loss and exercise are not effective.  If you are 38 to 43 years old, ask your caregiver if you should  take aspirin to prevent strokes.  Diabetes screening involves taking a blood sample to check your fasting blood sugar level. This should be done once every 3 years, after age 83, if you are within normal weight and without risk factors for diabetes. Testing should be considered at a younger age or be carried out more frequently if you are overweight and have at least 1 risk factor for diabetes.  Breast cancer screening is essential preventive care for  women. You should practice "breast self-awareness." This means understanding the normal appearance and feel of your breasts and may include breast self-examination. Any changes detected, no matter how small, should be reported to a caregiver. Women in their 41s and 30s should have a clinical breast exam (CBE) by a caregiver as part of a regular health exam every 1 to 3 years. After age 1, women should have a CBE every year. Starting at age 73, women should consider having a mammography (breast X-ray test) every year. Women who have a family history of breast cancer should talk to their caregiver about genetic screening. Women at a high risk of breast cancer should talk to their caregivers about having magnetic resonance imaging (MRI) and a mammography every year.  The Pap test is a screening test for cervical cancer. A Pap test can show cell changes on the cervix that might become cervical cancer if left untreated. A Pap test is a procedure in which cells are obtained and examined from the lower end of the uterus (cervix).  Women should have a Pap test starting at age 55.  Between ages 10 and 31, Pap tests should be repeated every 2 years.  Beginning at age 59, you should have a Pap test every 3 years as long as the past 3 Pap tests have been normal.  Some women have medical problems that increase the chance of getting cervical cancer. Talk to your caregiver about these problems. It is especially important to talk to your caregiver if a new problem develops soon after your last Pap test. In these cases, your caregiver may recommend more frequent screening and Pap tests.  The above recommendations are the same for women who have or have not gotten the vaccine for human papillomavirus (HPV).  If you had a hysterectomy for a problem that was not cancer or a condition that could lead to cancer, then you no longer need Pap tests. Even if you no longer need a Pap test, a regular exam is a good idea to make  sure no other problems are starting.  If you are between ages 9 and 39, and you have had normal Pap tests going back 10 years, you no longer need Pap tests. Even if you no longer need a Pap test, a regular exam is a good idea to make sure no other problems are starting.  If you have had past treatment for cervical cancer or a condition that could lead to cancer, you need Pap tests and screening for cancer for at least 20 years after your treatment.  If Pap tests have been discontinued, risk factors (such as a new sexual partner) need to be reassessed to determine if screening should be resumed.  The HPV test is an additional test that may be used for cervical cancer screening. The HPV test looks for the virus that can cause the cell changes on the cervix. The cells collected during the Pap test can be tested for HPV. The HPV test could be used to screen women  aged 30 years and older, and should be used in women of any age who have unclear Pap test results. After the age of 23, women should have HPV testing at the same frequency as a Pap test.  Colorectal cancer can be detected and often prevented. Most routine colorectal cancer screening begins at the age of 62 and continues through age 33. However, your caregiver may recommend screening at an earlier age if you have risk factors for colon cancer. On a yearly basis, your caregiver may provide home test kits to check for hidden blood in the stool. Use of a small camera at the end of a tube, to directly examine the colon (sigmoidoscopy or colonoscopy), can detect the earliest forms of colorectal cancer. Talk to your caregiver about this at age 30, when routine screening begins. Direct examination of the colon should be repeated every 5 to 10 years through age 47, unless early forms of pre-cancerous polyps or small growths are found.  Hepatitis C blood testing is recommended for all people born from [birth year] through 1965 and any individual with known risks  for hepatitis C.  Practice safe sex. Use condoms and avoid high-risk sexual practices to reduce the spread of sexually transmitted infections (STIs). STIs include gonorrhea, chlamydia, syphilis, trichomonas, herpes, HPV, and human immunodeficiency virus (HIV). Herpes, HIV, and HPV are viral illnesses that have no cure. They can result in disability, cancer, and death. Sexually active women aged 95 and younger should be checked for chlamydia. Older women with new or multiple partners should also be tested for chlamydia. Testing for other STIs is recommended if you are sexually active and at increased risk.  Osteoporosis is a disease in which the bones lose minerals and strength with aging. This can result in serious bone fractures. The risk of osteoporosis can be identified using a bone density scan. Women ages 73 and over and women at risk for fractures or osteoporosis should discuss screening with their caregivers. Ask your caregiver whether you should take a calcium supplement or vitamin D to reduce the rate of osteoporosis.  Menopause can be associated with physical symptoms and risks. Hormone replacement therapy is available to decrease symptoms and risks. You should talk to your caregiver about whether hormone replacement therapy is right for you.  Use sunscreen with sun protection factor (SPF) of 30 or more. Apply sunscreen liberally and repeatedly throughout the day. You should seek shade when your shadow is shorter than you. Protect yourself by wearing long sleeves, pants, a wide-brimmed hat, and sunglasses year round, whenever you are outdoors.  Once a month, do a whole body skin exam, using a mirror to look at the skin on your back. Notify your caregiver of new moles, moles that have irregular borders, moles that are larger than a pencil eraser, or moles that have changed in shape or color.  Stay current with required immunizations.  Influenza. You need a dose every fall (or winter). The  composition of the flu vaccine changes each year, so being vaccinated once is not enough.  Pneumococcal polysaccharide. You need 1 to 2 doses if you smoke cigarettes or if you have certain chronic medical conditions. You need 1 dose at age 82 (or older) if you have never been vaccinated.  Tetanus, diphtheria, pertussis (Tdap, Td). Get 1 dose of Tdap vaccine if you are younger than age 40, are over 51 and have contact with an infant, are a Research scientist (physical sciences), are pregnant, or simply want to be protected from whooping  cough. After that, you need a Td booster dose every 10 years. Consult your caregiver if you have not had at least 3 tetanus and diphtheria-containing shots sometime in your life or have a deep or dirty wound.  HPV. You need this vaccine if you are a woman age 82 or younger. The vaccine is given in 3 doses over 6 months.  Measles, mumps, rubella (MMR). You need at least 1 dose of MMR if you were born in 1957 or later. You may also need a second dose.  Meningococcal. If you are age 52 to 70 and a first-year college student living in a residence hall, or have one of several medical conditions, you need to get vaccinated against meningococcal disease. You may also need additional booster doses.  Zoster (shingles). If you are age 21 or older, you should get this vaccine.  Varicella (chickenpox). If you have never had chickenpox or you were vaccinated but received only 1 dose, talk to your caregiver to find out if you need this vaccine.  Hepatitis A. You need this vaccine if you have a specific risk factor for hepatitis A virus infection or you simply wish to be protected from this disease. The vaccine is usually given as 2 doses, 6 to 18 months apart.  Hepatitis B. You need this vaccine if you have a specific risk factor for hepatitis B virus infection or you simply wish to be protected from this disease. The vaccine is given in 3 doses, usually over 6 months. Preventive Services /  Frequency Ages 53 to 20  Blood pressure check.** / Every 1 to 2 years.  Lipid and cholesterol check.** / Every 5 years beginning at age 78.  Clinical breast exam.** / Every 3 years for women in their 11s and 30s.  Pap test.** / Every 2 years from ages 77 through 38. Every 3 years starting at age 55 through age 71 or 53 with a history of 3 consecutive normal Pap tests.  HPV screening.** / Every 3 years from ages 35 through ages 29 to 17 with a history of 3 consecutive normal Pap tests.  Hepatitis C blood test.** / For any individual with known risks for hepatitis C.  Skin self-exam. / Monthly.  Influenza immunization.** / Every year.  Pneumococcal polysaccharide immunization.** / 1 to 2 doses if you smoke cigarettes or if you have certain chronic medical conditions.  Tetanus, diphtheria, pertussis (Tdap, Td) immunization. / A one-time dose of Tdap vaccine. After that, you need a Td booster dose every 10 years.  HPV immunization. / 3 doses over 6 months, if you are 23 and younger.  Measles, mumps, rubella (MMR) immunization. / You need at least 1 dose of MMR if you were born in 1957 or later. You may also need a second dose.  Meningococcal immunization. / 1 dose if you are age 18 to 43 and a first-year college student living in a residence hall, or have one of several medical conditions, you need to get vaccinated against meningococcal disease. You may also need additional booster doses.  Varicella immunization.** / Consult your caregiver.  Hepatitis A immunization.** / Consult your caregiver. 2 doses, 6 to 18 months apart.  Hepatitis B immunization.** / Consult your caregiver. 3 doses usually over 6 months. Ages 84 to 21  Blood pressure check.** / Every 1 to 2 years.  Lipid and cholesterol check.** / Every 5 years beginning at age 20.  Clinical breast exam.** / Every year after age 11.  Mammogram.** /  Every year beginning at age 60 and continuing for as long as you are in  good health. Consult with your caregiver.  Pap test.** / Every 3 years starting at age 47 through age 82 or 60 with a history of 3 consecutive normal Pap tests.  HPV screening.** / Every 3 years from ages 36 through ages 70 to 75 with a history of 3 consecutive normal Pap tests.  Fecal occult blood test (FOBT) of stool. / Every year beginning at age 22 and continuing until age 64. You may not need to do this test if you get a colonoscopy every 10 years.  Flexible sigmoidoscopy or colonoscopy.** / Every 5 years for a flexible sigmoidoscopy or every 10 years for a colonoscopy beginning at age 90 and continuing until age 21.  Hepatitis C blood test.** / For all people born from [birth year] through 1966 and any individual with known risks for hepatitis C.  Skin self-exam. / Monthly.  Influenza immunization.** / Every year.  Pneumococcal polysaccharide immunization.** / 1 to 2 doses if you smoke cigarettes or if you have certain chronic medical conditions.  Tetanus, diphtheria, pertussis (Tdap, Td) immunization.** / A one-time dose of Tdap vaccine. After that, you need a Td booster dose every 10 years.  Measles, mumps, rubella (MMR) immunization. / You need at least 1 dose of MMR if you were born in 1957 or later. You may also need a second dose.  Varicella immunization.** / Consult your caregiver.  Meningococcal immunization.** / Consult your caregiver.  Hepatitis A immunization.** / Consult your caregiver. 2 doses, 6 to 18 months apart.  Hepatitis B immunization.** / Consult your caregiver. 3 doses, usually over 6 months. Ages 57 and over  Blood pressure check.** / Every 1 to 2 years.  Lipid and cholesterol check.** / Every 5 years beginning at age 15.  Clinical breast exam.** / Every year after age 52.  Mammogram.** / Every year beginning at age 55 and continuing for as long as you are in good health. Consult with your caregiver.  Pap test.** / Every 3 years starting at age 54 through  age 40 or 25 with a 3 consecutive normal Pap tests. Testing can be stopped between 65 and 70 with 3 consecutive normal Pap tests and no abnormal Pap or HPV tests in the past 10 years.  HPV screening.** / Every 3 years from ages 76 through ages 79 or 32 with a history of 3 consecutive normal Pap tests. Testing can be stopped between 65 and 70 with 3 consecutive normal Pap tests and no abnormal Pap or HPV tests in the past 10 years.  Fecal occult blood test (FOBT) of stool. / Every year beginning at age 2 and continuing until age 46. You may not need to do this test if you get a colonoscopy every 10 years.  Flexible sigmoidoscopy or colonoscopy.** / Every 5 years for a flexible sigmoidoscopy or every 10 years for a colonoscopy beginning at age 29 and continuing until age 40.  Hepatitis C blood test.** / For all people born from [birth year] through 1966 and any individual with known risks for hepatitis C.  Osteoporosis screening.** / A one-time screening for women ages 49 and over and women at risk for fractures or osteoporosis.  Skin self-exam. / Monthly.  Influenza immunization.** / Every year.  Pneumococcal polysaccharide immunization.** / 1 dose at age 30 (or older) if you have never been vaccinated.  Tetanus, diphtheria, pertussis (Tdap, Td) immunization. / A one-time dose of  Tdap vaccine if you are over 65 and have contact with an infant, are a Research scientist (physical sciences), or simply want to be protected from whooping cough. After that, you need a Td booster dose every 10 years.  Varicella immunization.** / Consult your caregiver.  Meningococcal immunization.** / Consult your caregiver.  Hepatitis A immunization.** / Consult your caregiver. 2 doses, 6 to 18 months apart.  Hepatitis B immunization.** / Check with your caregiver. 3 doses, usually over 6 months. ** Family history and personal history of risk and conditions may change your caregiver's recommendations. Document Released: 08/30/2001 Document  Revised: 09/26/2011 Document Reviewed: 11/29/2010 [facility] Patient Information 2014 [place], [place].

## 2012-12-22 ENCOUNTER — Ambulatory Visit (HOSPITAL_BASED_OUTPATIENT_CLINIC_OR_DEPARTMENT_OTHER)
Admission: RE | Admit: 2012-12-22 | Discharge: 2012-12-22 | Disposition: A | Discharge status: Home / Self Care | Payer: No Typology Code available for payment source | Source: Ambulatory Visit | Attending: Family Medicine | Admitting: Family Medicine

## 2012-12-22 DIAGNOSIS — R51 Headache: Secondary | ICD-10-CM | POA: Insufficient documentation

## 2012-12-22 DIAGNOSIS — M6281 Muscle weakness (generalized): Secondary | ICD-10-CM | POA: Insufficient documentation

## 2012-12-22 DIAGNOSIS — M4802 Spinal stenosis, cervical region: Secondary | ICD-10-CM | POA: Insufficient documentation

## 2012-12-22 DIAGNOSIS — R209 Unspecified disturbances of skin sensation: Secondary | ICD-10-CM | POA: Insufficient documentation

## 2012-12-22 DIAGNOSIS — M5030 Other cervical disc degeneration, unspecified cervical region: Secondary | ICD-10-CM | POA: Insufficient documentation

## 2012-12-22 DIAGNOSIS — M79609 Pain in unspecified limb: Secondary | ICD-10-CM | POA: Insufficient documentation

## 2012-12-22 DIAGNOSIS — M542 Cervicalgia: Secondary | ICD-10-CM | POA: Insufficient documentation

## 2012-12-23 ENCOUNTER — Encounter: Payer: Self-pay | Admitting: Family Medicine

## 2012-12-23 DIAGNOSIS — M542 Cervicalgia: Secondary | ICD-10-CM

## 2012-12-23 HISTORY — DX: Cervicalgia: M54.2

## 2012-12-23 NOTE — Assessment & Plan Note (Signed)
Encouraged DASH diet, exercise as tolerated. Add a krill oil cap, 8 hours of sleep, fasting labs drawn

## 2012-12-23 NOTE — Assessment & Plan Note (Signed)
Avoid offending foods and continue nexium

## 2012-12-23 NOTE — Assessment & Plan Note (Addendum)
No acute injury but DDD and narrowing are noted would benefit from referral to neurosurgery. 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 May try pain patches prn as well

## 2012-12-23 NOTE — Assessment & Plan Note (Signed)
Numbers mildly elevated but up slightly from last year. Minimize simple carbs and saturated fats. Avoid trans fats and add a krill oil cap

## 2012-12-23 NOTE — Assessment & Plan Note (Signed)
Resolved on current labwork

## 2012-12-23 NOTE — Progress Notes (Signed)
Patient ID: [name], female   DOB: 1970/08/08, 42 y.o.   MRN: [id number] [name] [id number] [birth year] 12/23/2012      Progress Note-Follow Up  Subjective  Chief Complaint  Chief Complaint  Patient presents with  . Annual Exam    physical    HPI  Patient is a 42 year old Caucasian female who is in today for annual exam. She continues to struggle with chronic pain. Has a distant history of surgical instrumentation and hardware left are notable for degenerative disc disease. Over the last several appear she's had increasing pain in her neck with some radicular symptoms to the arms at times. She says the pain in her left shoulder is constant but she's not developed more right-sided pain. In the past reports for neck with 2003 and was told that she had the beginning stages of degeneration. Otherwise she feels well. No recent illness. Chest pain or palpitations. Shortness or breath GI or GU complaints noted today.  Past Medical History  Diagnosis Date  . Fibromyalgia   . Obesity   . Melanoma     right hip and knee  . Chicken pox   . Allergy   . Migraine   . UTI (lower urinary tract infection)   . GERD (gastroesophageal reflux disease)   . Anxiety disorder   . Anemia     in the past  . Depression     in teh past- ok now  . Shortness of breath     when anemic  . Complication of anesthesia 1999    urinary retention  . Neck pain on right side 12/23/2012    Past Surgical History  Procedure Laterality Date  . Back surgery    . Cholecystectomy    . Wisdom tooth extraction  1989  . Melanoma excision  2011    stage I and II  . Laparoscopic assisted vaginal hysterectomy N/A 09/13/2012    Procedure: LAPAROSCOPIC ASSISTED VAGINAL HYSTERECTOMY;  Surgeon: Willodean Rosenthal, MD;  Location: [facility];  Service: Gynecology;  Laterality: N/A;  . Bilateral salpingectomy Bilateral 09/13/2012    Procedure: BILATERAL SALPINGECTOMY;  Surgeon: Willodean Rosenthal, MD;  Location: [facility];  Service: Gynecology;  Laterality: Bilateral;    Family History  Problem Relation Age of Onset  . Colon cancer Neg Hx   . Colitis Father     crohn's  . Heart disease Father     grandfather  . Heart attack Father 84  . Arthritis Father     s/p back surgery  . Diabetes      grandfather  . Irritable bowel syndrome    . Kidney disease      grandfather  . Alcohol abuse Other   . Arthritis Other   . Cancer Other     lung  . Hyperlipidemia Other   . Mental illness Other   . Other Mother     neurological autoimmune disease, chorea in trunk  . Emphysema Maternal Uncle   . Arthritis Maternal Grandmother   . Lupus Maternal Grandmother   . Dementia Maternal Grandmother   . Arthritis Paternal Grandmother   . Heart disease Paternal Grandmother     chf  . Diabetes Paternal Grandfather   . Kidney disease Paternal Grandfather   . Hypertension Paternal Grandfather   . Hyperlipidemia Paternal Grandfather     History   Social History  . Marital Status: Divorced    Spouse Name: N/A    Number of Children: N/A  . Years of  Education: N/A   Occupational History  . unemployed    Social History Main Topics  . Smoking status: Never Smoker   . Smokeless tobacco: Never Used  . Alcohol Use: Yes     Comment: occasional  . Drug Use: No  . Sexually Active: Not Currently    Birth Control/ Protection: None   Other Topics Concern  . Not on file   Social History Narrative   Daily caffeine    Current Outpatient Prescriptions on File Prior to Visit  Medication Sig Dispense Refill  . Ascorbic Acid (VITAMIN C WITH ROSE HIPS) 500 MG tablet Take 500 mg by mouth daily.      Marland Kitchen aspirin-acetaminophen-caffeine (EXCEDRIN MIGRAINE) 250-250-65 MG per tablet Take 2 tablets by mouth every 6 (six) hours as needed for pain.       . Calcium Carbonate-Vitamin D (CALTRATE 600+D) 600-400 MG-UNIT per tablet Take 2 tablets by mouth daily.       . cyanocobalamin 1000 MCG tablet Take 1,000 mcg by mouth  daily.      Marland Kitchen esomeprazole (NEXIUM) 40 MG capsule Take 40 mg by mouth daily.      . folic acid (FOLVITE) 400 MCG tablet Take 400 mcg by mouth daily.      . Magnesium 250 MG TABS Take 1 tablet by mouth daily.      . Multiple Vitamins-Calcium (ONE-A-DAY WOMENS FORMULA) TABS Take 1 tablet by mouth daily.      . Zinc 50 MG TABS Take 1 tablet by mouth daily.       No current facility-administered medications on file prior to visit.    Allergies  Allergen Reactions  . Adhesive (Tape) Dermatitis    Band- Aid adhesive  . Carbamazepine     Hallucinations and dizziness    Review of Systems  Review of Systems  Constitutional: Negative for fever and malaise/fatigue.  HENT: Positive for neck pain. Negative for congestion.   Eyes: Negative for discharge.  Respiratory: Negative for shortness of breath.   Cardiovascular: Negative for chest pain, palpitations and leg swelling.  Gastrointestinal: Negative for nausea, abdominal pain and diarrhea.  Genitourinary: Negative for dysuria.  Musculoskeletal: Positive for back pain. Negative for falls.  Skin: Negative for rash.  Neurological: Negative for loss of consciousness and headaches.  Endo/Heme/Allergies: Negative for polydipsia.  Psychiatric/Behavioral: Negative for depression and suicidal ideas. The patient is not nervous/anxious and does not have insomnia.     Objective  BP 118/82  Pulse 72  Temp(Src) 98.2 F (36.8 C) (Oral)  Ht 5\' 9"  (1.753 m)  Wt 279 lb 0.6 oz (126.572 kg)  BMI 41.19 kg/m2  SpO2 99%  LMP 08/13/2012  Physical Exam  Physical Exam  Constitutional: She is oriented to person, place, and time and well-developed, well-nourished, and in no distress. No distress.  HENT:  Head: Normocephalic and atraumatic.  Eyes: Conjunctivae are normal.  Neck: Neck supple. No thyromegaly present.  Cardiovascular: Normal rate, regular rhythm and normal heart sounds.   No murmur heard. Pulmonary/Chest: Effort normal and breath sounds  normal. She has no wheezes.  Abdominal: She exhibits no distension and no mass.  Musculoskeletal: She exhibits no edema.  Lymphadenopathy:    She has no cervical adenopathy.  Neurological: She is alert and oriented to person, place, and time.  Skin: Skin is warm and dry. No rash noted. She is not diaphoretic.  Psychiatric: Memory, affect and judgment normal.    Lab Results  Component Value Date   TSH 1.942 12/21/2012  Lab Results  Component Value Date   WBC 8.6 12/21/2012   HGB 13.7 12/21/2012   HCT 38.6 12/21/2012   MCV 80.9 12/21/2012   PLT 289 12/21/2012   Lab Results  Component Value Date   CREATININE 0.69 12/21/2012   BUN 16 12/21/2012   NA 135 12/21/2012   K 4.5 12/21/2012   CL 101 12/21/2012   CO2 24 12/21/2012   Lab Results  Component Value Date   ALT 12 12/21/2012   AST 13 12/21/2012   ALKPHOS 79 12/21/2012   BILITOT 0.3 12/21/2012   Lab Results  Component Value Date   CHOL 197 12/21/2012   Lab Results  Component Value Date   HDL 38* 12/21/2012   Lab Results  Component Value Date   LDLCALC 113* 12/21/2012   Lab Results  Component Value Date   TRIG 231* 12/21/2012   Lab Results  Component Value Date   CHOLHDL 5.2 12/21/2012     Assessment & Plan  ANEMIA, IRON DEFICIENCY Resolved on current labwork  GERD Avoid offending foods and continue nexium  Hypertriglyceridemia Numbers mildly elevated but up slightly from last year. Minimize simple carbs and saturated fats. Avoid trans fats and add a krill oil cap  Neck pain on right side No acute injury but DDD and narrowing are noted would benefit from referral to neurosurgery. 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 May try pain patches prn as well  Annual physical exam Encouraged DASH diet, exercise as tolerated. Add a krill oil cap, 8 hours of sleep, fasting labs drawn

## 2012-12-25 ENCOUNTER — Other Ambulatory Visit: Payer: Self-pay | Admitting: Family Medicine

## 2012-12-25 DIAGNOSIS — M5090 Cervical disc disorder, unspecified, unspecified cervical region: Secondary | ICD-10-CM

## 2012-12-25 NOTE — Progress Notes (Signed)
Patient ID: [name], female   DOB: 1970/09/28, 42 y.o.   MRN: [id number] Correction: Patient neck pain was documented as right sided but was in fact left sided

## 2012-12-25 NOTE — Progress Notes (Signed)
Quick Note:  Patient Informed and voiced understanding ______ 

## 2012-12-26 ENCOUNTER — Telehealth: Payer: Self-pay | Admitting: Internal Medicine

## 2012-12-26 DIAGNOSIS — M542 Cervicalgia: Secondary | ICD-10-CM

## 2012-12-26 NOTE — Telephone Encounter (Signed)
Please advise 

## 2012-12-26 NOTE — Telephone Encounter (Signed)
I do not recall the details of this aspect of our conversation, we talked about a lot of things. I am happy to refer her to Dr Cyndie Chime. Is she interested in seeing him for her anemia or is there another condition she would like him to address?

## 2012-12-26 NOTE — Telephone Encounter (Signed)
Patient is requesting a referral to [facility]-Dr. Cyndie Chime. Patient states that she talked about this at last visit

## 2012-12-27 NOTE — Telephone Encounter (Signed)
OK now I am caught up. So she did labs at her visit and the labs showed she has no anemia right now so she really does not need Dr Cyndie Chime for this right now

## 2012-12-27 NOTE — Telephone Encounter (Signed)
Spoke with pt. She states that she has seen him in the past for iron infusions and he discharged her as she no longer needed iron infusion and told her to have [place] refer her back if infusion was needed in the future. Pt has contacted them to schedule an appt but no one has called her back yet.  Please advise.

## 2012-12-28 ENCOUNTER — Other Ambulatory Visit: Payer: Self-pay | Admitting: Family Medicine

## 2012-12-28 ENCOUNTER — Telehealth: Payer: Self-pay | Admitting: *Deleted

## 2012-12-28 DIAGNOSIS — D649 Anemia, unspecified: Secondary | ICD-10-CM

## 2012-12-28 NOTE — Telephone Encounter (Signed)
Pt informed

## 2012-12-28 NOTE — Telephone Encounter (Addendum)
Received vm call yest from pt stating that she was a pt of Dr Dalene Carrow & saw Lonna Cobb but was d/c'd from practice & states that her ferritin was 23 12/21/12 & she needs IV iron.  She states she has financial aid through [facility] & needs to do this asap before aid expires.   Note to Dr Cyndie Chime.  Per Dr Cyndie Chime "hgb 13.7 on 12/21/12, ferritin 23, She does not need IV iron."  This information was given to the pt.  She stated that Dr. Dalene Carrow would have given her iron based on these labs.  Informed that Dr Cyndie Chime would not & suggested that if she if feeling bad to discuss with her PCP & have them discuss with Dr Cyndie Chime or Dr. Truett Perna.

## 2012-12-28 NOTE — Telephone Encounter (Signed)
No worries, I did not mean to frustrate her I just did not have the numbers when I saw her so I wanted her to understand they were good. But I have placed the referral

## 2012-12-28 NOTE — Telephone Encounter (Signed)
I called to inform pt and she is very frustrated because she said she states she explained this yesterday. Pt stated that she understands that her Ferritin levels are in range but if the MD will go back and look at her numbers that when they are this low they will drop even lower the next month. Pt states she was at [facility] for depression for 63 days last time and she doesn't want this to happen again.  Please advise?

## 2012-12-31 ENCOUNTER — Encounter: Payer: Self-pay | Admitting: Family Medicine

## 2012-12-31 ENCOUNTER — Encounter (HOSPITAL_COMMUNITY): Payer: Self-pay | Admitting: Emergency Medicine

## 2012-12-31 DIAGNOSIS — Z79899 Other long term (current) drug therapy: Secondary | ICD-10-CM | POA: Insufficient documentation

## 2012-12-31 DIAGNOSIS — Z87440 Personal history of urinary (tract) infections: Secondary | ICD-10-CM | POA: Insufficient documentation

## 2012-12-31 DIAGNOSIS — Z8679 Personal history of other diseases of the circulatory system: Secondary | ICD-10-CM | POA: Insufficient documentation

## 2012-12-31 DIAGNOSIS — Z8659 Personal history of other mental and behavioral disorders: Secondary | ICD-10-CM | POA: Insufficient documentation

## 2012-12-31 DIAGNOSIS — Z862 Personal history of diseases of the blood and blood-forming organs and certain disorders involving the immune mechanism: Secondary | ICD-10-CM | POA: Insufficient documentation

## 2012-12-31 DIAGNOSIS — E669 Obesity, unspecified: Secondary | ICD-10-CM | POA: Insufficient documentation

## 2012-12-31 DIAGNOSIS — Z85820 Personal history of malignant melanoma of skin: Secondary | ICD-10-CM | POA: Insufficient documentation

## 2012-12-31 DIAGNOSIS — Z872 Personal history of diseases of the skin and subcutaneous tissue: Secondary | ICD-10-CM | POA: Insufficient documentation

## 2012-12-31 DIAGNOSIS — Z7982 Long term (current) use of aspirin: Secondary | ICD-10-CM | POA: Insufficient documentation

## 2012-12-31 DIAGNOSIS — IMO0001 Reserved for inherently not codable concepts without codable children: Secondary | ICD-10-CM | POA: Insufficient documentation

## 2012-12-31 DIAGNOSIS — K219 Gastro-esophageal reflux disease without esophagitis: Secondary | ICD-10-CM | POA: Insufficient documentation

## 2012-12-31 DIAGNOSIS — F411 Generalized anxiety disorder: Secondary | ICD-10-CM | POA: Insufficient documentation

## 2012-12-31 DIAGNOSIS — G43909 Migraine, unspecified, not intractable, without status migrainosus: Secondary | ICD-10-CM | POA: Insufficient documentation

## 2012-12-31 DIAGNOSIS — Z8739 Personal history of other diseases of the musculoskeletal system and connective tissue: Secondary | ICD-10-CM | POA: Insufficient documentation

## 2012-12-31 NOTE — ED Notes (Signed)
PT. REPORTS CHRONIC MIGRAINE HEADACHE FOR 2 MONTHS WORSE THIS EVENING WITH SLIGHT NAUSEA AND BLURRED VISION UNRELIEVED BY OTC EXEDRIN .

## 2012-12-31 NOTE — Telephone Encounter (Signed)
Pt called and I informed pt. Pt states her eye doesn't look lazy but she feels like it is real tired.   Pt stated she is going to go to the ED

## 2012-12-31 NOTE — Telephone Encounter (Signed)
Please advise if pt needs appt here or go to ED

## 2013-01-01 ENCOUNTER — Emergency Department (HOSPITAL_COMMUNITY)
Admission: EM | Admit: 2013-01-01 | Discharge: 2013-01-01 | Disposition: A | Discharge status: Home / Self Care | Payer: No Typology Code available for payment source | Attending: Emergency Medicine | Admitting: Emergency Medicine

## 2013-01-01 DIAGNOSIS — G43909 Migraine, unspecified, not intractable, without status migrainosus: Secondary | ICD-10-CM

## 2013-01-01 MED ORDER — PROCHLORPERAZINE MALEATE 10 MG PO TABS: 10.0000 mg | ORAL_TABLET | Freq: Two times a day (BID) | ORAL | Status: DC | PRN

## 2013-01-01 MED ORDER — PROCHLORPERAZINE EDISYLATE 5 MG/ML IJ SOLN
10.0000 mg | Freq: Four times a day (QID) | INTRAMUSCULAR | Status: DC | PRN
  Administered 2013-01-01: 10 mg via INTRAVENOUS
  Filled 2013-01-01: qty 2

## 2013-01-01 MED ORDER — KETOROLAC TROMETHAMINE 30 MG/ML IJ SOLN
15.0000 mg | Freq: Once | INTRAMUSCULAR | Status: AC
  Administered 2013-01-01: 15 mg via INTRAVENOUS
  Filled 2013-01-01: qty 1

## 2013-01-01 MED ORDER — DIPHENHYDRAMINE HCL 50 MG/ML IJ SOLN
25.0000 mg | Freq: Once | INTRAMUSCULAR | Status: AC
  Administered 2013-01-01: 25 mg via INTRAVENOUS
  Filled 2013-01-01: qty 1

## 2013-01-01 MED ORDER — SODIUM CHLORIDE 0.9 % IV BOLUS (SEPSIS)
1000.0000 mL | Freq: Once | INTRAVENOUS | Status: AC
  Administered 2013-01-01: 1000 mL via INTRAVENOUS

## 2013-01-01 MED ORDER — DIPHENHYDRAMINE HCL 25 MG PO CAPS: 25.0000 mg | ORAL_CAPSULE | Freq: Four times a day (QID) | ORAL | Status: DC | PRN

## 2013-01-01 NOTE — ED Provider Notes (Signed)
History     CSN: [id number]  Arrival date & time 12/31/12  2310   First MD Initiated Contact with Patient 01/01/13 0229      Chief Complaint  Patient presents with  . Migraine   HPI [name] is a 42 y.o. female with a history of migraine headaches presents with headache. She says she's had these headaches almost daily for the past 2 months, however tonight it has been worse. You also relates a new symptom since last Friday, she says she has transient vision loss in the left eye, this has not occurred like a curtain, it occurs suddenly, briefly and returns sometimes associated with flashes of light. She's taking over-the-counter Excedrin and large quantities daily for headache relief. Her headache is on the left side of her head temple, behind the left eye, throbbing, sharp, with nausea but no vomiting, no chest pain, shortness of breath, diarrhea, fever, stiff neck, chills, or rash. Patient's pain has not been the worst headache of her life,  Past Medical History  Diagnosis Date  . Fibromyalgia   . Obesity   . Melanoma     right hip and knee  . Chicken pox   . Allergy   . Migraine   . UTI (lower urinary tract infection)   . GERD (gastroesophageal reflux disease)   . Anxiety disorder   . Anemia     in the past  . Depression     in teh past- ok now  . Shortness of breath     when anemic  . Complication of anesthesia 1999    urinary retention  . Neck pain on right side 12/23/2012    Past Surgical History  Procedure Laterality Date  . Back surgery    . Cholecystectomy    . Wisdom tooth extraction  1989  . Melanoma excision  2011    stage I and II  . Laparoscopic assisted vaginal hysterectomy N/A 09/13/2012    Procedure: LAPAROSCOPIC ASSISTED VAGINAL HYSTERECTOMY;  Surgeon: Willodean Rosenthal, MD;  Location: [facility];  Service: Gynecology;  Laterality: N/A;  . Bilateral salpingectomy Bilateral 09/13/2012    Procedure: BILATERAL SALPINGECTOMY;  Surgeon: Willodean Rosenthal, MD;  Location: [facility];  Service: Gynecology;  Laterality: Bilateral;    Family History  Problem Relation Age of Onset  . Colon cancer Neg Hx   . Colitis Father     crohn's  . Heart disease Father     grandfather  . Heart attack Father 50  . Arthritis Father     s/p back surgery  . Diabetes      grandfather  . Irritable bowel syndrome    . Kidney disease      grandfather  . Arthritis Other   . Cancer Other     bone  . Hyperlipidemia Other   . Mental illness Other   . Other Mother     neurological autoimmune disease, chorea in trunk  . Emphysema Maternal Uncle   . Arthritis Maternal Grandmother   . Lupus Maternal Grandmother   . Dementia Maternal Grandmother   . Arthritis Paternal Grandmother   . Heart disease Paternal Grandmother     chf  . Diabetes Paternal Grandfather   . Kidney disease Paternal Grandfather   . Hypertension Paternal Grandfather   . Hyperlipidemia Paternal Grandfather     History  Substance Use Topics  . Smoking status: Never Smoker   . Smokeless tobacco: Never Used  . Alcohol Use: Yes  Comment: occasional    OB History   Grav Para Term Preterm Abortions TAB SAB Ect Mult Living   0               Review of Systems At least 10pt or greater review of systems completed and are negative except where specified in the HPI.  Allergies  Adhesive and Carbamazepine  Home Medications   Current Outpatient Rx  Name  Route  Sig  Dispense  Refill  . Ascorbic Acid (VITAMIN C WITH ROSE HIPS) 500 MG tablet   Oral   Take 500 mg by mouth daily.         Marland Kitchen aspirin EC 81 MG tablet   Oral   Take 81 mg by mouth daily.         Marland Kitchen aspirin-acetaminophen-caffeine (EXCEDRIN MIGRAINE) 250-250-65 MG per tablet   Oral   Take 2 tablets by mouth every 6 (six) hours as needed for pain.          . Calcium Carbonate-Vitamin D (CALTRATE 600+D) 600-400 MG-UNIT per tablet   Oral   Take 2 tablets by mouth daily.          . cyanocobalamin  1000 MCG tablet   Oral   Take 1,000 mcg by mouth daily.         . diazepam (VALIUM) 10 MG tablet   Oral   Take 1 tablet (10 mg total) by mouth every 12 (twelve) hours as needed for anxiety.   30 tablet   1   . esomeprazole (NEXIUM) 40 MG capsule   Oral   Take 40 mg by mouth daily.         . folic acid (FOLVITE) 400 MCG tablet   Oral   Take 400 mcg by mouth daily.         . Magnesium 250 MG TABS   Oral   Take 1 tablet by mouth daily.         . Multiple Vitamins-Calcium (ONE-A-DAY WOMENS FORMULA) TABS   Oral   Take 1 tablet by mouth daily.         . Zinc 50 MG TABS   Oral   Take 1 tablet by mouth daily.           BP 157/90  Pulse 108  Temp(Src) 98.1 F (36.7 C) (Oral)  Resp 16  SpO2 97%  LMP 08/13/2012  Physical Exam  Nursing notes reviewed.  Electronic medical record reviewed. VITAL SIGNS:   Filed Vitals:   12/31/12 2318 01/01/13 0400 01/01/13 0430 01/01/13 0500  BP: 157/90 99/58 93/53  97/60  Pulse: 108 75 82 78  Temp: 98.1 F (36.7 C)     TempSrc: Oral     Resp: 16     SpO2: 97% 96% 93% 93%   CONSTITUTIONAL: Awake, oriented, appears non-toxic HENT: Atraumatic, normocephalic, oral mucosa pink and moist, airway patent. Nares patent without drainage. External ears normal. EYES: Conjunctiva clear, EOMI, PERRLA NECK: Trachea midline, non-tender, supple CARDIOVASCULAR: Normal heart rate, Normal rhythm, No murmurs, rubs, gallops PULMONARY/CHEST: Clear to auscultation, no rhonchi, wheezes, or rales. Symmetrical breath sounds. Non-tender. ABDOMINAL: Non-distended, soft, non-tender - no rebound or guarding.  BS normal. NEUROLOGIC: Cranial nerves are unremarkable. Non-focal, moving all four extremities, no gross sensory or motor deficits. EXTREMITIES: No clubbing, cyanosis, or edema SKIN: Warm, Dry, No erythema, No rash  ED Course  Procedures (including critical care time)  Labs Reviewed - No data to display No results found.   1. Migraine  MDM  CORTNIE RINGEL is a 42 y.o. female presents with headache concerned about visual disturbance-patient's mother has history of ocular migraine, her visual acuity is unremarkable on physical exam, her nursing notes in the left eye is 20/30, and the right eye is 20/20.  Patient has no other cranial nerve deficits, she does also have a history of Bell's palsy, no evidence of Bell's palsy at this time, I do not suspect CVA, do not suspect meningitis, SAH, or encephalitis. Likewise I do not suspect any intraocular problems, think this is likely all secondary to her headache. Treat patient's pain and eye symptoms as migraine headache and reevaluate.  After treatment, patient's headache is completely gone, her eye symptoms have completely resolved.  I do not think any further workup is needed at this time. We'll discharge the patient home stable and in good condition with neurology followup and she has not seen a neurologist for her chronic headaches. I spent extensive time at the bedside educating the patient about migraine headaches, ocular headaches as well as rebound phenomenon, patient has been taking a lot of Excedrin recently (no abdominal pain, no nausea vomiting or concern for acetaminophen toxicity) and this may be causing part of her problems.  We'll discharge her with some Compazine and Benadryl as well to control breakthrough headaches.         Jones Skene, MD 01/01/13 [phone]

## 2013-01-01 NOTE — ED Notes (Signed)
Left eye= 20-30. Right eye= 20-20.

## 2013-01-01 NOTE — ED Notes (Signed)
Pt called no answer 

## 2013-01-01 NOTE — ED Notes (Addendum)
Pt. C/o waking up Friday morning with migraine and unable to see out of left eye, states vision has since come back. Reports left side of body "feels heavy". No facial droop or arm drift noted. Pt. Has hx of migraines but states these symptoms are abnormal for her. Pt. Reports family hx of strokes

## 2013-01-02 ENCOUNTER — Telehealth: Payer: Self-pay | Admitting: Hematology & Oncology

## 2013-01-02 NOTE — Telephone Encounter (Signed)
Left pt message to call and confirm she wants to come here. The referral said Dr. Cyndie Chime but she has seen Dr. Dalene Carrow in the past

## 2013-01-03 ENCOUNTER — Telehealth: Payer: Self-pay | Admitting: Hematology & Oncology

## 2013-01-03 NOTE — Telephone Encounter (Signed)
Left pt message to call and schedule appointment °

## 2013-01-03 NOTE — Telephone Encounter (Signed)
Pt aware of 7-21 appointment °

## 2013-01-04 ENCOUNTER — Telehealth: Payer: Self-pay | Admitting: Internal Medicine

## 2013-01-04 NOTE — Telephone Encounter (Signed)
Patient states that she went to the ED as instructed. She says that the ED physician told her that she was having an ocular migraine but patient was not satisfied with that Dx. She then went to [facility] and the doctor there told her that she needs to have an MRI. The othmalogist told her that it is not an ocular migraine that its a visual migraine coming from back of the brain. Patient would like to proceed with MRI as soon as possible.

## 2013-01-04 NOTE — Telephone Encounter (Signed)
Please advise 

## 2013-01-05 ENCOUNTER — Other Ambulatory Visit: Payer: Self-pay | Admitting: Family Medicine

## 2013-01-05 DIAGNOSIS — R51 Headache: Secondary | ICD-10-CM

## 2013-01-05 NOTE — Telephone Encounter (Signed)
I will try to order MRI sometimes we have trouble getting them approved so if it is not approved from review of chart and previous visits, she may need to come in for further evaluation to document more findings ot get it approved

## 2013-01-08 ENCOUNTER — Ambulatory Visit (HOSPITAL_BASED_OUTPATIENT_CLINIC_OR_DEPARTMENT_OTHER)
Admission: RE | Admit: 2013-01-08 | Discharge: 2013-01-08 | Disposition: A | Discharge status: Home / Self Care | Payer: No Typology Code available for payment source | Source: Ambulatory Visit | Attending: Family Medicine | Admitting: Family Medicine

## 2013-01-08 DIAGNOSIS — R51 Headache: Secondary | ICD-10-CM

## 2013-01-08 DIAGNOSIS — H539 Unspecified visual disturbance: Secondary | ICD-10-CM | POA: Insufficient documentation

## 2013-01-08 DIAGNOSIS — M542 Cervicalgia: Secondary | ICD-10-CM | POA: Insufficient documentation

## 2013-01-08 MED ORDER — GADOBENATE DIMEGLUMINE 529 MG/ML IV SOLN
20.0000 mL | Freq: Once | INTRAVENOUS | Status: AC | PRN
  Administered 2013-01-08: 20 mL via INTRAVENOUS

## 2013-01-11 ENCOUNTER — Telehealth (HOSPITAL_COMMUNITY): Payer: Self-pay

## 2013-01-16 ENCOUNTER — Telehealth: Payer: Self-pay | Admitting: Internal Medicine

## 2013-01-21 ENCOUNTER — Ambulatory Visit (HOSPITAL_COMMUNITY)
Admission: RE | Admit: 2013-01-21 | Discharge: 2013-01-21 | Disposition: A | Discharge status: Home / Self Care | Payer: No Typology Code available for payment source | Source: Ambulatory Visit | Attending: Internal Medicine | Admitting: Internal Medicine

## 2013-01-21 DIAGNOSIS — Z1231 Encounter for screening mammogram for malignant neoplasm of breast: Secondary | ICD-10-CM | POA: Insufficient documentation

## 2013-01-23 ENCOUNTER — Encounter: Payer: Self-pay | Admitting: Family Medicine

## 2013-02-01 ENCOUNTER — Ambulatory Visit: Payer: Self-pay | Admitting: Obstetrics & Gynecology

## 2013-02-04 ENCOUNTER — Ambulatory Visit: Payer: No Typology Code available for payment source

## 2013-02-04 ENCOUNTER — Ambulatory Visit (HOSPITAL_BASED_OUTPATIENT_CLINIC_OR_DEPARTMENT_OTHER): Payer: No Typology Code available for payment source

## 2013-02-04 ENCOUNTER — Other Ambulatory Visit (HOSPITAL_BASED_OUTPATIENT_CLINIC_OR_DEPARTMENT_OTHER): Payer: No Typology Code available for payment source | Admitting: Lab

## 2013-02-04 ENCOUNTER — Telehealth: Payer: Self-pay | Admitting: Hematology & Oncology

## 2013-02-04 ENCOUNTER — Ambulatory Visit (HOSPITAL_BASED_OUTPATIENT_CLINIC_OR_DEPARTMENT_OTHER): Payer: No Typology Code available for payment source | Admitting: Hematology & Oncology

## 2013-02-04 VITALS — BP 99/52 | HR 66 | Temp 98.1°F | Resp 16 | Ht 69.0 in | Wt 285.0 lb

## 2013-02-04 DIAGNOSIS — D509 Iron deficiency anemia, unspecified: Secondary | ICD-10-CM

## 2013-02-04 MED ORDER — FERUMOXYTOL INJECTION 510 MG/17 ML
1020.0000 mg | Freq: Once | INTRAVENOUS | Status: AC
  Administered 2013-02-04: 1020 mg via INTRAVENOUS
  Filled 2013-02-04: qty 34

## 2013-02-04 MED ORDER — SODIUM CHLORIDE 0.9 % IV SOLN
Freq: Once | INTRAVENOUS | Status: AC
  Administered 2013-02-04: 15:00:00 via INTRAVENOUS

## 2013-02-04 NOTE — Patient Instructions (Signed)
Ferumoxytol injection What is this medicine? FERUMOXYTOL is an iron complex. Iron is used to make healthy red blood cells, which carry oxygen and nutrients throughout the body. This medicine is used to treat iron deficiency anemia in people with chronic kidney disease. This medicine may be used for other purposes; ask your health care provider or pharmacist if you have questions. What should I tell my health care provider before I take this medicine? They need to know if you have any of these conditions: -anemia not caused by low iron levels -high levels of iron in the blood -magnetic resonance imaging (MRI) test scheduled -an unusual or allergic reaction to iron, other medicines, foods, dyes, or preservatives -pregnant or trying to get pregnant -breast-feeding How should I use this medicine? This medicine is for infusion into a vein. It is given by a health care professional in a hospital or clinic setting. Talk to your pediatrician regarding the use of this medicine in children. Special care may be needed. Overdosage: If you think you've taken too much of this medicine contact a poison control center or emergency room at once. Overdosage: If you think you have taken too much of this medicine contact a poison control center or emergency room at once. NOTE: This medicine is only for you. Do not share this medicine with others. What if I miss a dose? It is important not to miss your dose. Call your doctor or health care professional if you are unable to keep an appointment. What may interact with this medicine? This medicine may interact with the following medications: -other iron products This list may not describe all possible interactions. Give your health care provider a list of all the medicines, herbs, non-prescription drugs, or dietary supplements you use. Also tell them if you smoke, drink alcohol, or use illegal drugs. Some items may interact with your medicine. What should I watch  for while using this medicine? Visit your doctor or healthcare professional regularly. Tell your doctor or healthcare professional if your symptoms do not start to get better or if they get worse. You may need blood work done while you are taking this medicine. You may need to follow a special diet. Talk to your doctor. Foods that contain iron include: whole grains/cereals, dried fruits, beans, or peas, leafy green vegetables, and organ meats (liver, kidney). What side effects may I notice from receiving this medicine? Side effects that you should report to your doctor or health care professional as soon as possible: -allergic reactions like skin rash, itching or hives, swelling of the face, lips, or tongue -breathing problems -changes in blood pressure -feeling faint or lightheaded, falls -fever or chills -flushing, sweating, or hot feelings -swelling of the ankles or feet Side effects that usually do not require medical attention (Report these to your doctor or health care professional if they continue or are bothersome.): -diarrhea -headache -nausea, vomiting -stomach pain This list may not describe all possible side effects. Call your doctor for medical advice about side effects. You may report side effects to FDA at 1-800-FDA-1088. Where should I keep my medicine? This drug is given in a hospital or clinic and will not be stored at home. NOTE: This sheet is a summary. It may not cover all possible information. If you have questions about this medicine, talk to your doctor, pharmacist, or health care provider.  2013, [employer]. (03/26/2008 9:48:25 PM)  

## 2013-02-04 NOTE — Telephone Encounter (Signed)
Pt has no insurance and EPP will expire on 02/11/2013. Pt was given a FINANCIAL ASSISTANCE AND HARDSHIP SETTLEMENT application to complete and return.

## 2013-02-05 LAB — CBC WITH DIFFERENTIAL (CANCER CENTER ONLY)
BASO%: 0.6 % (ref 0.0–2.0)
EOS%: 1.6 % (ref 0.0–7.0)
LYMPH#: 1.7 10*3/uL (ref 0.9–3.3)
MCHC: 33.3 g/dL (ref 32.0–36.0)
NEUT#: 4.6 10*3/uL (ref 1.5–6.5)
RDW: 14.6 % (ref 11.1–15.7)
WBC: 7.1 10*3/uL (ref 3.9–10.0)

## 2013-02-05 LAB — IRON AND TIBC CHCC
Iron: 72 ug/dL (ref 41–142)
TIBC: 283 ug/dL (ref 236–444)
UIBC: 211 ug/dL (ref 120–384)

## 2013-02-05 NOTE — Progress Notes (Signed)
DIAGNOSIS:  Intermittent iron deficiency anemia.  CURRENT THERAPY:  IV iron as indicated.  INTERIM HISTORY:  [name] comes to the [place] office for the first time.  She was seen at the main cancer center.  She was initially seen Dr. Dalene Carrow.  She then saw Romie Levee.  [name] has not had IV iron, probably for a year.  She was discharged from the main cancer center as they felt that she was doing fairly well.  She follows up with Dr. Fredirick Lathe. She recently saw Dr. Fredirick Lathe.  This was back in June.  She had a ferritin of 23.  For her that is low.  She does have quite a few other health issues.  She has had no bleeding.  She has had a hysterectomy.  She has had no cough.  She has had no rashes.  There has been no leg swelling.  She does feel quite tired.  She feels that she definitely would benefit from some IV iron.  She has had no change in her medications.  She does take vitamin B12.  There is no obvious history of pernicious anemia.  She did have an MRI of the brain recently.  This is because she was having some visual issues.  MRI of the brain showed no acute intracranial abnormality.  She did have her gallbladder taken out back in 2012.  She does have a history of melanoma.  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  PHYSICAL EXAMINATION:  General:  This is an obese white female in no obvious distress.  Vital signs:  Temperature of 98.1, pulse  66, respiratory rate 16, blood pressure 99/52.  Weight is 285.  Head and neck:  Normocephalic, atraumatic skull.  There are no ocular or oral lesions.  There are no palpable cervical or supraclavicular lymph nodes. Lungs:  Clear bilaterally.  Cardiac:  Regular rate and rhythm with a normal S1, S2.  There are no murmurs, rubs or bruits.  Abdomen:  Soft with good bowel sounds.  She is obese.  She has a couple of keloids on the abdominal wall.  She has well-healed  laparoscopy scars.  There is no palpable hepatosplenomegaly.  Back:  No tenderness over the spine, ribs, or hips.  Extremities:  Show no clubbing, cyanosis or edema.  Skin: Shows no suspicious hyperpigmented lesions.  Neurological:  Shows no focal neurological deficits.  LABORATORY STUDIES:  White cell count 7.5, hemoglobin 13.4, hematocrit 40.3, platelet count 249.  MCV is 86.  Peripheral smear does show some microcytic red cells.  She has no rouleaux formation.  There are no schistocytes.  I see no polychromasia. She has no nucleated red blood cells.  White cells appear normal in morphology.  There are no hypersegmented polys.  I see no immature myeloid or lymphoid forms.  Platelets are adequate in number and size.  IMPRESSION:  [name] is a very nice 42 year old white female with intermittent iron-deficiency anemia. I definitely agree that her iron is on the low side.  I think for her, ferritin less than 100 might be considered low given her other health issues.  We will go ahead and give her a dose of IV iron today.  We will give her a dose of Feraheme at 1020 mg.  I think her last Duncan Dull was given back in May of 2013.  I want to get Ms. Lewis back here in a couple of months  so we can monitor her iron studies.    ______________________________ Josph Macho, M.D. PRE/MEDQ  D:  02/04/2013  T:  02/05/2013  Job:  [id number]

## 2013-02-13 ENCOUNTER — Telehealth: Payer: Self-pay | Admitting: Internal Medicine

## 2013-02-13 NOTE — Telephone Encounter (Signed)
lvm for pt telling her I will leave samples at our front desk for her, and to call me back if she has questions

## 2013-02-26 NOTE — Telephone Encounter (Signed)
See previous encounter

## 2013-03-13 ENCOUNTER — Other Ambulatory Visit: Payer: Self-pay | Admitting: Family Medicine

## 2013-03-13 NOTE — Telephone Encounter (Signed)
Please advise refill? Last RX was done on -7-14 quantity 30 with 0 refills.  If ok fax to [phone]

## 2013-03-14 NOTE — Telephone Encounter (Signed)
rx faxed

## 2013-03-26 ENCOUNTER — Ambulatory Visit (INDEPENDENT_AMBULATORY_CARE_PROVIDER_SITE_OTHER): Payer: No Typology Code available for payment source | Admitting: Family Medicine

## 2013-03-26 ENCOUNTER — Encounter: Payer: Self-pay | Admitting: Family Medicine

## 2013-03-26 VITALS — BP 141/97 | HR 86 | Temp 98.5°F | Ht 69.0 in | Wt 287.0 lb

## 2013-03-26 DIAGNOSIS — F329 Major depressive disorder, single episode, unspecified: Secondary | ICD-10-CM

## 2013-03-26 DIAGNOSIS — F341 Dysthymic disorder: Secondary | ICD-10-CM

## 2013-03-26 DIAGNOSIS — R52 Pain, unspecified: Secondary | ICD-10-CM

## 2013-03-26 DIAGNOSIS — G459 Transient cerebral ischemic attack, unspecified: Secondary | ICD-10-CM

## 2013-03-26 DIAGNOSIS — E669 Obesity, unspecified: Secondary | ICD-10-CM

## 2013-03-26 DIAGNOSIS — F32A Depression, unspecified: Secondary | ICD-10-CM

## 2013-03-26 DIAGNOSIS — E041 Nontoxic single thyroid nodule: Secondary | ICD-10-CM

## 2013-03-26 MED ORDER — CITALOPRAM HYDROBROMIDE 20 MG PO TABS: 20.0000 mg | ORAL_TABLET | Freq: Every day | ORAL | Status: DC

## 2013-03-26 MED ORDER — DIAZEPAM 10 MG PO TABS: 10.0000 mg | ORAL_TABLET | Freq: Two times a day (BID) | ORAL | Status: DC | PRN

## 2013-03-26 MED ORDER — MELOXICAM 15 MG PO TABS: 15.0000 mg | ORAL_TABLET | Freq: Every day | ORAL | Status: DC

## 2013-03-26 MED ORDER — CARISOPRODOL 350 MG PO TABS: 350.0000 mg | ORAL_TABLET | Freq: Three times a day (TID) | ORAL | Status: DC | PRN

## 2013-03-28 ENCOUNTER — Encounter (INDEPENDENT_AMBULATORY_CARE_PROVIDER_SITE_OTHER): Payer: No Typology Code available for payment source

## 2013-03-28 DIAGNOSIS — G459 Transient cerebral ischemic attack, unspecified: Secondary | ICD-10-CM

## 2013-03-29 NOTE — Progress Notes (Signed)
Quick Note:  Patient Informed and voiced understanding.  Pt stated they told pt the results but that there was a cyst on left side and did MD want to follow that? ______

## 2013-03-31 ENCOUNTER — Encounter: Payer: Self-pay | Admitting: Family Medicine

## 2013-03-31 DIAGNOSIS — R52 Pain, unspecified: Secondary | ICD-10-CM

## 2013-03-31 DIAGNOSIS — E042 Nontoxic multinodular goiter: Secondary | ICD-10-CM | POA: Insufficient documentation

## 2013-03-31 DIAGNOSIS — G459 Transient cerebral ischemic attack, unspecified: Secondary | ICD-10-CM

## 2013-03-31 DIAGNOSIS — E041 Nontoxic single thyroid nodule: Secondary | ICD-10-CM

## 2013-03-31 HISTORY — DX: Nontoxic single thyroid nodule: E04.1

## 2013-03-31 HISTORY — DX: Pain, unspecified: R52

## 2013-03-31 HISTORY — DX: Transient cerebral ischemic attack, unspecified: G45.9

## 2013-03-31 NOTE — Assessment & Plan Note (Signed)
Episode of transient blindness a couple months ago has been seeing opthamology and has been noted to have dry eyes. Vision 20/25.carotid dopplers unremarkable today. Continue Aggrenox

## 2013-03-31 NOTE — Assessment & Plan Note (Signed)
Found incidentally on carotid doppler, will proceed with thyroid ultrasound.

## 2013-03-31 NOTE — Progress Notes (Signed)
Patient ID: [name], female   DOB: [birth year], 42 y.o.   MRN: [id number] [name] [id number] 12/09/1970 03/31/2013      Progress Note-Follow Up  Subjective  Chief Complaint  Chief Complaint  Patient presents with  . Follow-up    3 month    HPI  Patient is a 42 year-old female in today for followup. She continues to struggle with chronic pain. Has neck pain as well as bilateral knee pain left worse than right. Bilateral hand pain right worse than left. As well as bilateral foot pain. Her knees hurt to the point where they buckle at times. No recent fall. Also notes back pain. If she has to make her pain her hands right worse than her knees and her back in her feet. It is scaly and she's not had any further episodes of transient blindness since the episode in June. Has been seen by ophthalmology. Has frequent swelling in hands nightly as well as swelling in her feet and ankles. No chest pain or palpitations. No shortness of breath GI or GU concerns  Past Medical History  Diagnosis Date  . Fibromyalgia   . Obesity   . Melanoma     right hip and knee  . Chicken pox   . Allergy   . Migraine   . UTI (lower urinary tract infection)   . GERD (gastroesophageal reflux disease)   . Anxiety disorder   . Anemia     in the past  . Depression     in teh past- ok now  . Shortness of breath     when anemic  . Complication of anesthesia 1999    urinary retention  . Neck pain on right side 12/23/2012  . Left thyroid nodule 03/31/2013  . Pain 03/31/2013  . TIA (transient ischemic attack) 03/31/2013    Past Surgical History  Procedure Laterality Date  . Back surgery    . Cholecystectomy    . Wisdom tooth extraction  1989  . Melanoma excision  2011    stage I and II  . Laparoscopic assisted vaginal hysterectomy N/A 09/13/2012    Procedure: LAPAROSCOPIC ASSISTED VAGINAL HYSTERECTOMY;  Surgeon: Willodean Rosenthal, MD;  Location: [facility];  Service: Gynecology;  Laterality: N/A;  .  Bilateral salpingectomy Bilateral 09/13/2012    Procedure: BILATERAL SALPINGECTOMY;  Surgeon: Willodean Rosenthal, MD;  Location: [facility];  Service: Gynecology;  Laterality: Bilateral;    Family History  Problem Relation Age of Onset  . Colon cancer Neg Hx   . Colitis Father     crohn's  . Heart disease Father     grandfather  . Heart attack Father 9  . Arthritis Father     s/p back surgery  . Diabetes      grandfather  . Irritable bowel syndrome    . Kidney disease      grandfather  . Arthritis Other   . Cancer Other     bone  . Hyperlipidemia Other   . Mental illness Other   . Other Mother     neurological autoimmune disease, chorea in trunk  . Emphysema Maternal Uncle   . Arthritis Maternal Grandmother   . Lupus Maternal Grandmother   . Dementia Maternal Grandmother   . Arthritis Paternal Grandmother   . Heart disease Paternal Grandmother     chf  . Diabetes Paternal Grandfather   . Kidney disease Paternal Grandfather   . Hypertension Paternal Grandfather   . Hyperlipidemia Paternal Grandfather  History   Social History  . Marital Status: Divorced    Spouse Name: N/A    Number of Children: N/A  . Years of Education: N/A   Occupational History  . unemployed    Social History Main Topics  . Smoking status: Never Smoker   . Smokeless tobacco: Never Used  . Alcohol Use: Yes     Comment: occasional  . Drug Use: No  . Sexual Activity: Not Currently    Birth Control/ Protection: None   Other Topics Concern  . Not on file   Social History Narrative   Daily caffeine    Current Outpatient Prescriptions on File Prior to Visit  Medication Sig Dispense Refill  . Ascorbic Acid (VITAMIN C WITH ROSE HIPS) 500 MG tablet Take 500 mg by mouth daily.      Marland Kitchen aspirin-acetaminophen-caffeine (EXCEDRIN MIGRAINE) 250-250-65 MG per tablet Take 2 tablets by mouth every 6 (six) hours as needed for pain.       . Calcium Carbonate-Vitamin D (CALTRATE 600+D) 600-400  MG-UNIT per tablet Take 2 tablets by mouth daily.       . cyanocobalamin 1000 MCG tablet Take 1,000 mcg by mouth daily.      Marland Kitchen esomeprazole (NEXIUM) 40 MG capsule Take 40 mg by mouth daily.      . folic acid (FOLVITE) 400 MCG tablet Take 400 mcg by mouth daily.      . Magnesium 250 MG TABS Take 1 tablet by mouth daily.      . Multiple Vitamins-Calcium (ONE-A-DAY WOMENS FORMULA) TABS Take 1 tablet by mouth daily.      . Zinc 50 MG TABS Take 1 tablet by mouth daily.       No current facility-administered medications on file prior to visit.    Allergies  Allergen Reactions  . Adhesive [Tape] Dermatitis    Band- Aid adhesive  . Aleve [Naproxen Sodium]     Makes hands, legs, and feet swell  . Carbamazepine     Hallucinations and dizziness    Review of Systems  Review of Systems  Constitutional: Negative for fever and malaise/fatigue.  HENT: Negative for congestion.   Eyes: Negative for discharge.  Respiratory: Negative for shortness of breath.   Cardiovascular: Positive for leg swelling. Negative for chest pain and palpitations.  Gastrointestinal: Negative for nausea, abdominal pain and diarrhea.  Genitourinary: Negative for dysuria.  Musculoskeletal: Positive for joint pain. Negative for falls.  Skin: Negative for rash.  Neurological: Negative for loss of consciousness and headaches.  Endo/Heme/Allergies: Negative for polydipsia.  Psychiatric/Behavioral: Negative for depression and suicidal ideas. The patient is not nervous/anxious and does not have insomnia.     Objective  BP 141/97  Pulse 86  Temp(Src) 98.5 F (36.9 C) (Oral)  Ht 5\' 9"  (1.753 m)  Wt 287 lb 0.6 oz (130.2 kg)  BMI 42.37 kg/m2  SpO2 97%  LMP 08/13/2012  Physical Exam  Physical Exam  Constitutional: She is oriented to person, place, and time and well-developed, well-nourished, and in no distress. No distress.  HENT:  Head: Normocephalic and atraumatic.  Eyes: Conjunctivae are normal.  Neck: Neck  supple. No thyromegaly present.  Cardiovascular: Normal rate, regular rhythm and normal heart sounds.   No murmur heard. Pulmonary/Chest: Effort normal and breath sounds normal. She has no wheezes.  Abdominal: She exhibits no distension and no mass.  Musculoskeletal: She exhibits no edema.  Lymphadenopathy:    She has no cervical adenopathy.  Neurological: She is alert and oriented  to person, place, and time.  Skin: Skin is warm and dry. No rash noted. She is not diaphoretic.  Psychiatric: Memory, affect and judgment normal.    Lab Results  Component Value Date   TSH 1.942 12/21/2012   Lab Results  Component Value Date   WBC 7.1 02/04/2013   HGB 13.4 02/04/2013   HCT 40.3 02/04/2013   MCV 86 02/04/2013   PLT 249 02/04/2013   Lab Results  Component Value Date   CREATININE 0.69 12/21/2012   BUN 16 12/21/2012   NA 135 12/21/2012   K 4.5 12/21/2012   CL 101 12/21/2012   CO2 24 12/21/2012   Lab Results  Component Value Date   ALT 12 12/21/2012   AST 13 12/21/2012   ALKPHOS 79 12/21/2012   BILITOT 0.3 12/21/2012   Lab Results  Component Value Date   CHOL 197 12/21/2012   Lab Results  Component Value Date   HDL 38* 12/21/2012   Lab Results  Component Value Date   LDLCALC 113* 12/21/2012   Lab Results  Component Value Date   TRIG 231* 12/21/2012   Lab Results  Component Value Date   CHOLHDL 5.2 12/21/2012     Assessment & Plan  Left thyroid nodule Found incidentally on carotid doppler, will proceed with thyroid ultrasound.  Pain Diffuse at this time. Struggling with pain in knees, hands, feet b/l. Intermittent swelling. May need referral to rheumatologist. Increase activity start krill oil and try Salon Pas cream or patches.  TIA (transient ischemic attack) Episode of transient blindness a couple months ago has been seeing opthamology and has been noted to have dry eyes. Vision 20/25.carotid dopplers unremarkable today. Continue Aggrenox  Obesity Encouraged DASH diet and movement as  tolerated.

## 2013-03-31 NOTE — Assessment & Plan Note (Signed)
Diffuse at this time. Struggling with pain in knees, hands, feet b/l. Intermittent swelling. May need referral to rheumatologist. Increase activity start krill oil and try Salon Pas cream or patches.

## 2013-03-31 NOTE — Assessment & Plan Note (Signed)
Encouraged DASH diet and movement as tolerated.

## 2013-04-02 ENCOUNTER — Telehealth: Payer: Self-pay

## 2013-04-02 ENCOUNTER — Other Ambulatory Visit: Payer: Self-pay | Admitting: Family Medicine

## 2013-04-02 DIAGNOSIS — E041 Nontoxic single thyroid nodule: Secondary | ICD-10-CM

## 2013-04-02 NOTE — Telephone Encounter (Signed)
Message copied by Court Joy on Tue Apr 02, 2013  3:21 PM ------      Message from: Danise Edge A      Created: Sun Mar 31, 2013 11:28 AM       I have ordered a thyroid ultrasound to evaluate her thyroid nodule more. Please let her know ------

## 2013-04-02 NOTE — Telephone Encounter (Signed)
I informed patient.   I don't see a referral in though?

## 2013-04-04 ENCOUNTER — Ambulatory Visit (HOSPITAL_BASED_OUTPATIENT_CLINIC_OR_DEPARTMENT_OTHER)
Admission: RE | Admit: 2013-04-04 | Discharge: 2013-04-04 | Disposition: A | Discharge status: Home / Self Care | Payer: No Typology Code available for payment source | Source: Ambulatory Visit | Attending: Family Medicine | Admitting: Family Medicine

## 2013-04-04 ENCOUNTER — Other Ambulatory Visit: Payer: Self-pay | Admitting: Family Medicine

## 2013-04-04 DIAGNOSIS — E041 Nontoxic single thyroid nodule: Secondary | ICD-10-CM

## 2013-04-04 DIAGNOSIS — M542 Cervicalgia: Secondary | ICD-10-CM | POA: Insufficient documentation

## 2013-04-05 ENCOUNTER — Telehealth: Payer: Self-pay | Admitting: Family Medicine

## 2013-04-05 NOTE — Telephone Encounter (Signed)
Called pt to give her the appointment to see Dr Gerrit Friends,  Pt want to do needle biopsy only . She has no insurance and will be charged for office consult if she see the Careers adviser. If you agree, please send new order.

## 2013-04-05 NOTE — Telephone Encounter (Signed)
Patient  Called back  She is going to see surgery    Do not need to send order

## 2013-04-08 ENCOUNTER — Ambulatory Visit (HOSPITAL_BASED_OUTPATIENT_CLINIC_OR_DEPARTMENT_OTHER): Payer: No Typology Code available for payment source | Admitting: Hematology & Oncology

## 2013-04-08 ENCOUNTER — Other Ambulatory Visit (HOSPITAL_BASED_OUTPATIENT_CLINIC_OR_DEPARTMENT_OTHER): Payer: No Typology Code available for payment source | Admitting: Lab

## 2013-04-08 VITALS — BP 113/59 | HR 60 | Temp 98.2°F | Resp 16 | Ht 69.0 in | Wt 283.0 lb

## 2013-04-08 DIAGNOSIS — D509 Iron deficiency anemia, unspecified: Secondary | ICD-10-CM

## 2013-04-08 LAB — CBC WITH DIFFERENTIAL (CANCER CENTER ONLY)
Eosinophils Absolute: 0.1 10*3/uL (ref 0.0–0.5)
HCT: 41.6 % (ref 34.8–46.6)
HGB: 14.5 g/dL (ref 11.6–15.9)
LYMPH%: 24.5 % (ref 14.0–48.0)
MCV: 89 fL (ref 81–101)
MONO#: 0.6 10*3/uL (ref 0.1–0.9)
NEUT%: 67.4 % (ref 39.6–80.0)
Platelets: 247 10*3/uL (ref 145–400)
RBC: 4.67 10*6/uL (ref 3.70–5.32)
WBC: 8.5 10*3/uL (ref 3.9–10.0)

## 2013-04-08 NOTE — Progress Notes (Signed)
This office note has been dictated.

## 2013-04-09 ENCOUNTER — Encounter (INDEPENDENT_AMBULATORY_CARE_PROVIDER_SITE_OTHER): Payer: Self-pay | Admitting: Surgery

## 2013-04-09 ENCOUNTER — Telehealth: Payer: Self-pay | Admitting: *Deleted

## 2013-04-09 ENCOUNTER — Ambulatory Visit (INDEPENDENT_AMBULATORY_CARE_PROVIDER_SITE_OTHER): Payer: Self-pay | Admitting: Surgery

## 2013-04-09 VITALS — BP 122/68 | HR 72 | Resp 20 | Ht 69.0 in | Wt 283.0 lb

## 2013-04-09 DIAGNOSIS — E042 Nontoxic multinodular goiter: Secondary | ICD-10-CM

## 2013-04-09 LAB — COMPREHENSIVE METABOLIC PANEL
CO2: 23 mEq/L (ref 19–32)
Glucose, Bld: 85 mg/dL (ref 70–99)
Sodium: 136 mEq/L (ref 135–145)
Total Bilirubin: 0.4 mg/dL (ref 0.3–1.2)
Total Protein: 7 g/dL (ref 6.0–8.3)

## 2013-04-09 LAB — FERRITIN CHCC: Ferritin: 166 ng/ml (ref 9–269)

## 2013-04-09 LAB — IRON AND TIBC CHCC
Iron: 87 ug/dL (ref 41–142)
TIBC: 230 ug/dL — ABNORMAL LOW (ref 236–444)

## 2013-04-09 NOTE — Telephone Encounter (Addendum)
Message copied by Mirian Capuchin on Tue Apr 09, 2013  5:04 PM ------      Message from: Josph Macho      Created: Tue Apr 09, 2013  1:44 PM       Call - iron is ok!!  Cindee Lame ------This message given to pt.  Voiced understanding.

## 2013-04-09 NOTE — Patient Instructions (Signed)

## 2013-04-09 NOTE — Progress Notes (Signed)
General Surgery [facility]  Chief Complaint  Patient presents with  . New Evaluation    eval thyroid nodule - referral from Dr. Danise Edge    HISTORY: Patient is a 42 year old female referred by her primary care physician for evaluation of newly diagnosed thyroid nodules. Patient had had an episode of transient vision loss. Part of her evaluation included a carotid duplex. She was incidentally noted to have thyroid nodules. Patient subsequently underwent a thyroid ultrasound in September 2014. This demonstrated a minimally enlarged thyroid gland with heterogeneous parenchyma. There were numerous bilateral subcentimeter nodules. There was a dominant nodule in the left thyroid lobe measuring 18 mm in maximum dimension. Biopsy was recommended.  Recent TSH level is normal at 1.9.  Patient has no prior history of thyroid disease. She has never been on thyroid medication. She has had no prior head or neck surgery. Workup for her transient vision loss has been unrevealing.  There is a family history of hypothyroidism and the patient's mother who accompanies her today. There is no family history of endocrine neoplasm.  Past Medical History  Diagnosis Date  . Fibromyalgia   . Obesity   . Melanoma     right hip and knee  . Chicken pox   . Allergy   . Migraine   . UTI (lower urinary tract infection)   . GERD (gastroesophageal reflux disease)   . Anxiety disorder   . Anemia     in the past  . Depression     in teh past- ok now  . Shortness of breath     when anemic  . Complication of anesthesia 1999    urinary retention  . Neck pain on right side 12/23/2012  . Left thyroid nodule 03/31/2013  . Pain 03/31/2013  . TIA (transient ischemic attack) 03/31/2013    Current Outpatient Prescriptions  Medication Sig Dispense Refill  . aspirin-acetaminophen-caffeine (EXCEDRIN MIGRAINE) 250-250-65 MG per tablet Take 2 tablets by mouth every 6 (six) hours as needed for pain.        . diazepam (VALIUM) 10 MG tablet Take 1 tablet (10 mg total) by mouth every 12 (twelve) hours as needed for anxiety or sleep.  45 tablet  1  . esomeprazole (NEXIUM) 40 MG capsule Take 40 mg by mouth daily.      . carisoprodol (SOMA) 350 MG tablet Take 1 tablet (350 mg total) by mouth 3 (three) times daily as needed for muscle spasms.  90 tablet  1   No current facility-administered medications for this visit.    Allergies  Allergen Reactions  . Adhesive [Tape] Dermatitis    Band- Aid adhesive  . Aleve [Naproxen Sodium]     Makes hands, legs, and feet swell  . Carbamazepine     Hallucinations and dizziness    Family History  Problem Relation Age of Onset  . Colon cancer Neg Hx   . Colitis Father     crohn's  . Heart disease Father     grandfather  . Heart attack Father 62  . Arthritis Father     s/p back surgery  . Diabetes      grandfather  . Irritable bowel syndrome    . Kidney disease      grandfather  . Arthritis Other   . Cancer Other     breast  . Hyperlipidemia Other   . Mental illness Other   . Other Mother     neurological autoimmune disease, chorea in  trunk  . Emphysema Maternal Uncle   . Arthritis Maternal Grandmother   . Lupus Maternal Grandmother   . Dementia Maternal Grandmother   . Arthritis Paternal Grandmother   . Heart disease Paternal Grandmother     chf  . Diabetes Paternal Grandfather   . Kidney disease Paternal Grandfather   . Hypertension Paternal Grandfather   . Hyperlipidemia Paternal Grandfather   . Cancer Maternal Grandfather     bone    History   Social History  . Marital Status: Divorced    Spouse Name: N/A    Number of Children: N/A  . Years of Education: N/A   Occupational History  . unemployed    Social History Main Topics  . Smoking status: Never Smoker   . Smokeless tobacco: Never Used  . Alcohol Use: Yes     Comment: occasional  . Drug Use: No  . Sexual Activity: Not Currently    Birth Control/ Protection:  None   Other Topics Concern  . None   Social History Narrative   Daily caffeine    REVIEW OF SYSTEMS - PERTINENT POSITIVES ONLY: Denies tremor. Denies palpitation. Denies compressive symptoms.  EXAM: Filed Vitals:   04/09/13 1019  BP: 122/68  Pulse: 72  Resp: 20    HEENT: normocephalic; pupils equal and reactive; sclerae clear; dentition good; mucous membranes moist NECK:  Mildly nodular thyroid gland within normal limits of size, nontender; symmetric on extension; no palpable anterior or posterior cervical lymphadenopathy; no supraclavicular masses; no tenderness CHEST: clear to auscultation bilaterally without rales, rhonchi, or wheezes CARDIAC: regular rate and rhythm without significant murmur; peripheral pulses are full EXT:  non-tender without edema; no deformity NEURO: no gross focal deficits; no sign of tremor   LABORATORY RESULTS: See [facility] (CHL-Epic) for most recent results  RADIOLOGY RESULTS: See [facility] (CHL-Epic) for most recent results  IMPRESSION: Bilateral thyroid nodules, dominant nodule left lobe, 1.8 cm  PLAN: I discussed the above findings at length with the patient and her mother. I provided her with written literature to review at home. I have recommended that she proceed with ultrasound-guided fine-needle aspiration biopsy. We will review the results of the cytopathology. If they are benign, then I think she can be safely observed with follow-up evaluation in 6 months. If there is any sign of atypia or malignancy, she may require thyroid lobectomy or thyroidectomy.  We will schedule the ultrasound-guided fine-needle aspiration biopsy and contact the patient with the results of her cytopathology when it is available.  Velora Heckler, MD, FACS General & Endocrine Surgery [facility]  Primary Care Physician: Danise Edge, MD

## 2013-04-10 ENCOUNTER — Other Ambulatory Visit (HOSPITAL_COMMUNITY)
Admission: RE | Admit: 2013-04-10 | Discharge: 2013-04-10 | Disposition: A | Discharge status: Home / Self Care | Payer: No Typology Code available for payment source | Source: Ambulatory Visit | Attending: Interventional Radiology | Admitting: Interventional Radiology

## 2013-04-10 ENCOUNTER — Ambulatory Visit
Admission: RE | Admit: 2013-04-10 | Discharge: 2013-04-10 | Disposition: A | Discharge status: Home / Self Care | Payer: No Typology Code available for payment source | Source: Ambulatory Visit | Attending: Surgery | Admitting: Surgery

## 2013-04-10 DIAGNOSIS — E041 Nontoxic single thyroid nodule: Secondary | ICD-10-CM | POA: Insufficient documentation

## 2013-04-10 DIAGNOSIS — E042 Nontoxic multinodular goiter: Secondary | ICD-10-CM

## 2013-04-11 ENCOUNTER — Telehealth (INDEPENDENT_AMBULATORY_CARE_PROVIDER_SITE_OTHER): Payer: Self-pay

## 2013-04-11 NOTE — Telephone Encounter (Signed)
Message copied by Joanette Gula on Thu Apr 11, 2013  2:58 PM ------      Message from: Velora Heckler      Created: Thu Apr 11, 2013  2:44 PM       Arline Asp,            Call patient for me.  Path is benign.  We should see her in 6 months for follow up.            Thanks,            tmg ------

## 2013-04-11 NOTE — Telephone Encounter (Signed)
Pt notified of path result and need to f/u in 6 mon. Pt states she understands.

## 2013-04-16 ENCOUNTER — Other Ambulatory Visit: Payer: Self-pay

## 2013-04-16 NOTE — Progress Notes (Signed)
CC:   Velora Heckler, MD Danise Edge, MD  DIAGNOSIS:  Intermittent iron-deficiency anemia.  CURRENT THERAPY:  IV iron as indicated.  The patient last received iron back in July.  INTERIM HISTORY:  Ms. [name] comes in for followup.  She is doing fairly well.  Unfortunately, her issues now are not related to her blood.  She apparently had a transient blindness in the left eye.  She was tested extensively.  She had a carotid Doppler.  She had an MRI.  She ophthalmological exam.  So far nothing came out for this.  Her vision recovered fully.  She was found have thyroid nodules.  She had a thyroid ultrasound, which showed a dominant right thyroid lobe nodule.  She has seen Dr. Gerrit Friends. I suppose a biopsy will be done.  She has had no problems with bowels or bladder.  She does not have her monthly cycles.  She has had a hysterectomy.  She has had no leg swelling.  There has been no rashes.  PHYSICAL EXAMINATION:  General:  This is a well-developed, well- nourished white female who is somewhat obese.  Vital signs:  Temperature of 98.2, pulse 60, respiratory rate 18, blood pressure 113/59.  Weight is 283.  Head and neck:  Normocephalic, atraumatic skull.  There are no ocular or oral lesions.  There are no palpable cervical or supraclavicular lymph nodes.  Lungs:  Clear to percussion and auscultation bilaterally.  Cardiac:  Regular rate and rhythm with a normal S1 and S2.  There are no murmurs, rubs or bruits.  Abdomen: Soft.  She is obese.  She has good bowel sounds.  There is no fluid wave.  There is no palpable hepatosplenomegaly.  Extremities.  Show no clubbing, cyanosis or edema.  Neurological:  Shows no focal neurological deficits.  LABORATORY STUDIES:  White cell count is 8.5, hemoglobin 14.5, hematocrit 41.6, platelet count is 247.  MCV is 89.  IMPRESSION:  Ms. [name] is a very nice 42 year old white female with intermittent iron-deficiency anemia.  So far, I would not think  that iron deficiency is a problem for her. Her MCV is up.  I did look at her blood smear.  I do not see anything that looks like recurrent iron deficiency.  Her red cells were normochromic and normocytic.  We will go ahead and get her back in 3 more months.  I think this would be reasonable.    ______________________________ Josph Macho, M.D. PRE/MEDQ  D:  04/08/2013  T:  04/16/2013  Job:  [id number]

## 2013-04-17 ENCOUNTER — Telehealth: Payer: Self-pay | Admitting: Internal Medicine

## 2013-04-23 ENCOUNTER — Encounter: Payer: Self-pay | Admitting: Family Medicine

## 2013-04-23 NOTE — Telephone Encounter (Signed)
Please advise mychart question? 

## 2013-04-25 NOTE — Telephone Encounter (Signed)
Please advise 

## 2013-04-29 MED ORDER — VENLAFAXINE HCL 50 MG PO TABS: 50.0000 mg | ORAL_TABLET | ORAL | Status: DC

## 2013-04-29 NOTE — Telephone Encounter (Signed)
So we are doing Venlafaxine 50 mg tid? #90 for a month?  Please advise?

## 2013-05-03 ENCOUNTER — Ambulatory Visit (INDEPENDENT_AMBULATORY_CARE_PROVIDER_SITE_OTHER): Payer: No Typology Code available for payment source | Admitting: Obstetrics & Gynecology

## 2013-05-03 ENCOUNTER — Encounter: Payer: Self-pay | Admitting: Obstetrics & Gynecology

## 2013-05-03 VITALS — BP 131/81 | HR 75 | Temp 97.4°F | Ht 69.0 in | Wt 288.6 lb

## 2013-05-03 DIAGNOSIS — Z7251 High risk heterosexual behavior: Secondary | ICD-10-CM

## 2013-05-03 LAB — HEPATITIS PANEL, ACUTE
HCV Ab: NEGATIVE
Hep B C IgM: NONREACTIVE
Hepatitis B Surface Ag: NEGATIVE

## 2013-05-03 NOTE — Patient Instructions (Signed)
Sexually Transmitted Disease  Sexually transmitted disease (STD) refers to any infection that is passed from person to person during sexual activity. This may happen by way of saliva, semen, blood, vaginal mucus, or urine. Common STDs include:   Gonorrhea.   Chlamydia.   Syphilis.   HIV/AIDS.   Genital herpes.   Hepatitis B and C.   Trichomonas.   Human papillomavirus (HPV).   Pubic lice.  CAUSES   An STD may be spread by bacteria, virus, or parasite. A person can get an STD by:   Sexual intercourse with an infected person.   Sharing sex toys with an infected person.   Sharing needles with an infected person.   Having intimate contact with the genitals, mouth, or rectal areas of an infected person.  SYMPTOMS   Some people may not have any symptoms, but they can still pass the infection to others. Different STDs have different symptoms. Symptoms include:   Painful or bloody urination.   Pain in the pelvis, abdomen, vagina, anus, throat, or eyes.   Skin rash, itching, irritation, growths, or sores (lesions). These usually occur in the genital or anal area.   Abnormal vaginal discharge.   Penile discharge in men.   Soft, flesh-colored skin growths in the genital or anal area.   Fever.   Pain or bleeding during sexual intercourse.   Swollen glands in the groin area.   Yellow skin and eyes (jaundice). This is seen with hepatitis.  DIAGNOSIS   To make a diagnosis, your caregiver may:   Take a medical history.   Perform a physical exam.   Take a specimen (culture) to be examined.   Examine a sample of discharge under a microscope.   Perform blood tests.   Perform a Pap test, if this applies.   Perform a colposcopy.   Perform a laparoscopy.  TREATMENT    Chlamydia, gonorrhea, trichomonas, and syphilis can be cured with antibiotic medicine.   Genital herpes, hepatitis, and HIV can be treated, but not cured, with prescribed medicines. The medicines will lessen the symptoms.   Genital warts  from HPV can be treated with medicine or by freezing, burning (electrocautery), or surgery. Warts may come back.   HPV is a virus and cannot be cured with medicine or surgery.However, abnormal areas may be followed very closely by your caregiver and may be removed from the cervix, vagina, or vulva through office procedures or surgery.  If your diagnosis is confirmed, your recent sexual partners need treatment. This is true even if they are symptom-free or have a negative culture or evaluation. They should not have sex until their caregiver says it is okay.  HOME CARE INSTRUCTIONS   All sexual partners should be informed, tested, and treated for all STDs.   Take your antibiotics as directed. Finish them even if you start to feel better.   Only take over-the-counter or prescription medicines for pain, discomfort, or fever as directed by your caregiver.   Rest.   Eat a balanced diet and drink enough fluids to keep your urine clear or pale yellow.   Do not have sex until treatment is completed and you have followed up with your caregiver. STDs should be checked after treatment.   Keep all follow-up appointments, Pap tests, and blood tests as directed by your caregiver.   Only use latex condoms and water-soluble lubricants during sexual activity. Do not use petroleum jelly or oils.   Avoid alcohol and illegal drugs.   Get vaccinated   for HPV and hepatitis. If you have not received these vaccines in the past, talk to your caregiver about whether one or both might be right for you.   Avoid risky sex practices that can break the skin.  The only way to avoid getting an STD is to avoid all sexual activity.Latex condoms and dental dams (for oral sex) will help lessen the risk of getting an STD, but will not completely eliminate the risk.  SEEK MEDICAL CARE IF:    You have a fever.   You have any new or worsening symptoms.  Document Released: 09/24/2002 Document Revised: 09/26/2011 Document Reviewed:  10/01/2010  [employer] Patient Information 2014 [employer].

## 2013-05-03 NOTE — Progress Notes (Signed)
Subjective:     Patient ID: [name], female   DOB: [birth year], 42 y.o.   MRN: [id number]  HPI Pt did not come in for her 3 month check post op and wanted to be seen.  She denies post op problems but, does reports that her partner was cheating on her and she has felt lesions on her vulva.  No itching or pain noted.  She just wants to have her vulva looked examined.   Review of Systems     Objective:   Physical Exam BP 131/81  Pulse 75  Temp(Src) 97.4 F (36.3 C) (Oral)  Ht 5\' 9"  (1.753 m)  Wt 288 lb 9.6 oz (130.908 kg)  BMI 42.6 kg/m2  LMP 08/13/2012  Pt in NAD EGBUS: no lesions noted     Assessment:     Possible exposure to STI.  Normal exam     Plan:     Lab: HIV; RPR and hepatitis panel

## 2013-05-06 ENCOUNTER — Ambulatory Visit: Payer: Self-pay | Admitting: Family Medicine

## 2013-05-06 ENCOUNTER — Encounter: Payer: Self-pay | Admitting: Obstetrics & Gynecology

## 2013-05-29 ENCOUNTER — Telehealth: Payer: Self-pay | Admitting: Hematology & Oncology

## 2013-05-29 NOTE — Telephone Encounter (Signed)
Pt called to change/schedule new date and time, due to school starting on Dec 15

## 2013-06-05 ENCOUNTER — Ambulatory Visit (INDEPENDENT_AMBULATORY_CARE_PROVIDER_SITE_OTHER): Payer: No Typology Code available for payment source

## 2013-06-05 DIAGNOSIS — Z0000 Encounter for general adult medical examination without abnormal findings: Secondary | ICD-10-CM

## 2013-06-05 DIAGNOSIS — Z23 Encounter for immunization: Secondary | ICD-10-CM

## 2013-06-05 NOTE — Progress Notes (Signed)
  Subjective:    Patient ID: [name], female    DOB: 07-23-1970, 42 y.o.   MRN: [id number]  HPI    Review of Systems     Objective:   Physical Exam        Assessment & Plan:  Patient came in today for a tdap, flu and ppd placement. Pt tolerated injections well and aware that she will need to return on 06-07-13 at 9:30 am for reading of ppd.   Lab work for MMR and varicella titer also placed.

## 2013-06-07 ENCOUNTER — Encounter: Payer: Self-pay | Admitting: *Deleted

## 2013-06-07 LAB — TB SKIN TEST: TB Skin Test: NEGATIVE

## 2013-06-07 LAB — VARICELLA ZOSTER ANTIBODY, IGG: Varicella IgG: 1763 Index — ABNORMAL HIGH (ref <135.00)

## 2013-06-07 LAB — MEASLES/MUMPS/RUBELLA IMMUNITY
Mumps IgG: 91.8 AU/mL — ABNORMAL HIGH (ref <9.00)
Rubella: 3.17 Index — ABNORMAL HIGH (ref <0.90)

## 2013-06-17 ENCOUNTER — Other Ambulatory Visit: Payer: Self-pay | Admitting: Family Medicine

## 2013-06-18 ENCOUNTER — Ambulatory Visit (HOSPITAL_COMMUNITY): Payer: Self-pay | Admitting: Marriage and Family Therapist

## 2013-06-18 NOTE — Telephone Encounter (Signed)
Please advise refill?  Last RX was wrote on 03-26-13 quantity 45 with 1 refill  If ok fax to [phone]

## 2013-06-18 NOTE — Telephone Encounter (Signed)
RX faxed

## 2013-06-19 ENCOUNTER — Telehealth: Payer: Self-pay | Admitting: Internal Medicine

## 2013-06-19 NOTE — Telephone Encounter (Signed)
Put samples up front

## 2013-06-24 NOTE — Progress Notes (Signed)
CC:   Courtney Heckler, MD Courtney Edge, MD  DIAGNOSIS:  Intermittent iron-deficiency anemia.  CURRENT THERAPY:  IV iron as indicated -- the patient last received iron back in July.  INTERIM HISTORY:  [name] comes in for followup.  She is doing fairly well.  Unfortunately, her issues now are not linked to her blood.  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  She was tested extensively.  She had a carotid Doppler.  She had an MRI.  She had an ophthalmologic exam.  So far, nothing came out for this.  Her vision recovered fully.  She was found have thyroid nodules.  She had a thyroid ultrasound which showed a dominant right thyroid lobe nodule.  She is seeing Dr. Gerrit Friends. I suppose a biopsy will be done.  She has had no problems with bowels or bladder.  She does not have her menstrual cycles as she has had a hysterectomy.  She has had no leg swelling.  There have been no rashes.  PHYSICAL EXAMINATION:  General:  This is a well-developed, well- nourished white female, who is somewhat obese.  Vital Signs:  Show temperature of 98.2, pulse 60, respiratory rate 18, blood pressure 113/59, weight is 283.  Head and Neck:  Normocephalic, atraumatic skull. There are no ocular or oral lesions.  There are no palpable cervical or supraclavicular lymph nodes.  Lungs:  Clear to percussion and auscultation bilaterally.  Cardiac:  Regular rate and rhythm with a normal S1 and S2.  There are no murmurs, rubs, or bruits.  Abdomen: Soft.  She is obese.  She has good bowel sounds.  There is no fluid wave.  There is no palpable hepatosplenomegaly.  Extremities:  No clubbing, cyanosis, or edema now.  Neurologic:  No focal neurological deficits.  LABORATORY STUDIES:  White cell count is 8.5, hemoglobin 14.5, hematocrit 41.6, platelet count is 247.  MCV is 89.  IMPRESSION:  [name] is a very nice 42 year old white female with intermittent iron-deficiency anemia.  So far, I  would not think that iron deficiency is a problem for her. Her MCV is up.  I did look at her blood smear.  I do not see anything that looks like recurrent iron deficiency.  Her red cells were normochromic and normocytic.  We will go ahead and get her back in 3 more months.  I think this would be reasonable.    ______________________________ Josph Macho, M.D. PRE/MEDQ  D:  04/08/2013  T:  06/23/2013  Job:  [id number]

## 2013-06-28 ENCOUNTER — Ambulatory Visit (HOSPITAL_BASED_OUTPATIENT_CLINIC_OR_DEPARTMENT_OTHER): Payer: No Typology Code available for payment source | Admitting: Hematology & Oncology

## 2013-06-28 ENCOUNTER — Other Ambulatory Visit (HOSPITAL_BASED_OUTPATIENT_CLINIC_OR_DEPARTMENT_OTHER): Payer: No Typology Code available for payment source | Admitting: Lab

## 2013-06-28 VITALS — BP 123/97 | HR 90 | Temp 97.8°F | Resp 16 | Ht 69.0 in | Wt 287.0 lb

## 2013-06-28 DIAGNOSIS — D509 Iron deficiency anemia, unspecified: Secondary | ICD-10-CM

## 2013-06-28 LAB — CBC WITH DIFFERENTIAL (CANCER CENTER ONLY)
BASO#: 0 10e3/uL (ref 0.0–0.2)
BASO%: 0.1 % (ref 0.0–2.0)
EOS%: 0.9 % (ref 0.0–7.0)
Eosinophils Absolute: 0.1 10e3/uL (ref 0.0–0.5)
HCT: 42.8 % (ref 34.8–46.6)
HGB: 14.6 g/dL (ref 11.6–15.9)
LYMPH#: 2 10e3/uL (ref 0.9–3.3)
LYMPH%: 21.8 % (ref 14.0–48.0)
MCH: 31 pg (ref 26.0–34.0)
MCHC: 34.1 g/dL (ref 32.0–36.0)
MCV: 91 fL (ref 81–101)
MONO#: 0.7 10e3/uL (ref 0.1–0.9)
MONO%: 7.8 % (ref 0.0–13.0)
NEUT#: 6.2 10e3/uL (ref 1.5–6.5)
NEUT%: 69.4 % (ref 39.6–80.0)
Platelets: 238 10e3/uL (ref 145–400)
RBC: 4.71 10e6/uL (ref 3.70–5.32)
RDW: 12.2 % (ref 11.1–15.7)
WBC: 8.9 10e3/uL (ref 3.9–10.0)

## 2013-06-28 NOTE — Progress Notes (Signed)
This office note has been dictated.

## 2013-07-01 LAB — IRON AND TIBC CHCC
TIBC: 249 ug/dL (ref 236–444)
UIBC: 200 ug/dL (ref 120–384)

## 2013-07-01 LAB — FERRITIN CHCC: Ferritin: 105 ng/ml (ref 9–269)

## 2013-07-01 NOTE — Progress Notes (Signed)
DIAGNOSIS:  Intermittent iron deficiency anemia.  CURRENT THERAPY:  IV iron as indicated.  INTERIM HISTORY:  [name] comes in for a followup.  She is doing fairly well.  We last saw her back in September.  She had iron studies when we last saw her.  Her iron studies at that point in time showed a ferritin of 166 with iron saturation of 38%.  She feels well.  She will be starting school in March.  She has had no bleeding.  She has had hysterectomy.  I think the hysterectomy probably helped more than anything else.  She has had no nausea or vomiting.  She had a thyroid nodule biopsy by Dr.Gerkin.  This was in September.  The biopsy report a showed a benign nodule.  She has had no fevers, sweats, or chills.  She has had no rashes.  PHYSICAL EXAMINATION:  On physical exam, this is a mildly obese white female in no obvious distress.  Vital signs show a  temperature of 97.8, pulse 90, respiratory rate 16, blood pressure 123/97, weight is 287 pounds.  Head and neck exam shows a normocephalic, atraumatic skull. There are no ocular or oral lesions.  There are no palpable cervical or supraclavicular lymph nodes.  Lungs are clear bilaterally.  Cardiac exam:  Regular rate and rhythm with a normal S1, S2.  She has no murmurs, rubs, or bruits.  Abdomen is soft, obese.  She has decent bowel sounds.  There is no fluid wave.  There is no palpable hepatosplenomegaly.  Back exam:  No tenderness over the spine, ribs, or hips.  Extremities show no clubbing, cyanosis, or edema.  Skin exam:  No rashes, ecchymosis, or petechia.  LABORATORY STUDIES:  White cell count is 8.9, hemoglobin 14.6, hematocrit 42.8, platelet count 238.  MCV is 91.  IMPRESSION:  [name] is a very nice 42 year old white female.  She has history of iron deficiency anemia.  Again, I think the hysterectomy __________ that could be done for the iron deficiency.  I see no evidence of iron deficiency right now.  I looked at  her blood smear.  Her red blood cells appeared normochromic and normocytic.  I will plan to get her back in 3 more months.  She wants to come back to see [place] before she goes on to "[employer]."    ______________________________ Josph Macho, M.D. PRE/MEDQ  D:  06/28/2013  T:  06/29/2013  Job:  [id number]

## 2013-07-08 ENCOUNTER — Other Ambulatory Visit: Payer: Self-pay | Admitting: Lab

## 2013-07-08 ENCOUNTER — Ambulatory Visit: Payer: Self-pay | Admitting: Hematology & Oncology

## 2013-07-21 ENCOUNTER — Other Ambulatory Visit: Payer: Self-pay | Admitting: Family Medicine

## 2013-07-21 DIAGNOSIS — F419 Anxiety disorder, unspecified: Secondary | ICD-10-CM

## 2013-07-22 ENCOUNTER — Telehealth: Payer: Self-pay | Admitting: Internal Medicine

## 2013-07-22 NOTE — Telephone Encounter (Signed)
Please advise refill? Last RX wrote on 06-17-13, quantity 30 with 0 refills  If ok please fax to [phone]

## 2013-07-23 NOTE — Telephone Encounter (Signed)
Left message with patient that we did not have any Nexium samples

## 2013-08-02 ENCOUNTER — Other Ambulatory Visit: Payer: No Typology Code available for payment source

## 2013-08-02 DIAGNOSIS — Z113 Encounter for screening for infections with a predominantly sexual mode of transmission: Secondary | ICD-10-CM

## 2013-08-02 LAB — HIV ANTIBODY (ROUTINE TESTING W REFLEX): HIV: NONREACTIVE

## 2013-08-13 ENCOUNTER — Other Ambulatory Visit: Payer: Self-pay | Admitting: Family Medicine

## 2013-08-14 NOTE — Telephone Encounter (Signed)
Last meloxicam refill 03/26/13, #30 x no refills. Please advise. Also, pt last seen in September and advised 6 week f/u. Pt cancelled f/u and has no future appts on file.  When should pt f/u again?

## 2013-08-28 ENCOUNTER — Emergency Department (HOSPITAL_BASED_OUTPATIENT_CLINIC_OR_DEPARTMENT_OTHER): Payer: No Typology Code available for payment source

## 2013-08-28 ENCOUNTER — Encounter (HOSPITAL_BASED_OUTPATIENT_CLINIC_OR_DEPARTMENT_OTHER): Payer: Self-pay | Admitting: Emergency Medicine

## 2013-08-28 ENCOUNTER — Emergency Department (HOSPITAL_BASED_OUTPATIENT_CLINIC_OR_DEPARTMENT_OTHER)
Admission: EM | Admit: 2013-08-28 | Discharge: 2013-08-28 | Disposition: A | Discharge status: Home / Self Care | Payer: No Typology Code available for payment source | Attending: Emergency Medicine | Admitting: Emergency Medicine

## 2013-08-28 DIAGNOSIS — G43909 Migraine, unspecified, not intractable, without status migrainosus: Secondary | ICD-10-CM | POA: Insufficient documentation

## 2013-08-28 DIAGNOSIS — K219 Gastro-esophageal reflux disease without esophagitis: Secondary | ICD-10-CM | POA: Insufficient documentation

## 2013-08-28 DIAGNOSIS — F411 Generalized anxiety disorder: Secondary | ICD-10-CM | POA: Insufficient documentation

## 2013-08-28 DIAGNOSIS — N83209 Unspecified ovarian cyst, unspecified side: Secondary | ICD-10-CM | POA: Insufficient documentation

## 2013-08-28 DIAGNOSIS — Z791 Long term (current) use of non-steroidal anti-inflammatories (NSAID): Secondary | ICD-10-CM | POA: Insufficient documentation

## 2013-08-28 DIAGNOSIS — IMO0001 Reserved for inherently not codable concepts without codable children: Secondary | ICD-10-CM | POA: Insufficient documentation

## 2013-08-28 DIAGNOSIS — Z87440 Personal history of urinary (tract) infections: Secondary | ICD-10-CM | POA: Insufficient documentation

## 2013-08-28 DIAGNOSIS — Z862 Personal history of diseases of the blood and blood-forming organs and certain disorders involving the immune mechanism: Secondary | ICD-10-CM | POA: Insufficient documentation

## 2013-08-28 DIAGNOSIS — Z85820 Personal history of malignant melanoma of skin: Secondary | ICD-10-CM | POA: Insufficient documentation

## 2013-08-28 DIAGNOSIS — Z8619 Personal history of other infectious and parasitic diseases: Secondary | ICD-10-CM | POA: Insufficient documentation

## 2013-08-28 DIAGNOSIS — Z8673 Personal history of transient ischemic attack (TIA), and cerebral infarction without residual deficits: Secondary | ICD-10-CM | POA: Insufficient documentation

## 2013-08-28 DIAGNOSIS — E669 Obesity, unspecified: Secondary | ICD-10-CM | POA: Insufficient documentation

## 2013-08-28 LAB — CBC WITH DIFFERENTIAL/PLATELET
BASOS PCT: 0 % (ref 0–1)
Basophils Absolute: 0 10*3/uL (ref 0.0–0.1)
Eosinophils Absolute: 0 10*3/uL (ref 0.0–0.7)
Eosinophils Relative: 1 % (ref 0–5)
HCT: 42.3 % (ref 36.0–46.0)
HEMOGLOBIN: 14.6 g/dL (ref 12.0–15.0)
Lymphocytes Relative: 17 % (ref 12–46)
Lymphs Abs: 1.5 10*3/uL (ref 0.7–4.0)
MCH: 31.3 pg (ref 26.0–34.0)
MCHC: 34.5 g/dL (ref 30.0–36.0)
MCV: 90.6 fL (ref 78.0–100.0)
MONOS PCT: 7 % (ref 3–12)
Monocytes Absolute: 0.6 10*3/uL (ref 0.1–1.0)
Neutro Abs: 6.5 10*3/uL (ref 1.7–7.7)
Neutrophils Relative %: 75 % (ref 43–77)
Platelets: 230 10*3/uL (ref 150–400)
RBC: 4.67 MIL/uL (ref 3.87–5.11)
RDW: 12.2 % (ref 11.5–15.5)
WBC: 8.6 10*3/uL (ref 4.0–10.5)

## 2013-08-28 LAB — URINALYSIS, ROUTINE W REFLEX MICROSCOPIC
Bilirubin Urine: NEGATIVE
GLUCOSE, UA: NEGATIVE mg/dL
HGB URINE DIPSTICK: NEGATIVE
KETONES UR: NEGATIVE mg/dL
LEUKOCYTES UA: NEGATIVE
Nitrite: NEGATIVE
PH: 6 (ref 5.0–8.0)
Protein, ur: NEGATIVE mg/dL
Specific Gravity, Urine: 1.021 (ref 1.005–1.030)
Urobilinogen, UA: 0.2 mg/dL (ref 0.0–1.0)

## 2013-08-28 LAB — COMPREHENSIVE METABOLIC PANEL
ALBUMIN: 3.9 g/dL (ref 3.5–5.2)
ALK PHOS: 82 U/L (ref 39–117)
ALT: 11 U/L (ref 0–35)
AST: 10 U/L (ref 0–37)
BUN: 10 mg/dL (ref 6–23)
CO2: 22 mEq/L (ref 19–32)
Calcium: 9.1 mg/dL (ref 8.4–10.5)
Chloride: 103 mEq/L (ref 96–112)
Creatinine, Ser: 0.7 mg/dL (ref 0.50–1.10)
GFR calc Af Amer: >90 mL/min (ref >90)
GFR calc non Af Amer: >90 mL/min (ref >90)
Glucose, Bld: 101 mg/dL — ABNORMAL HIGH (ref 70–99)
POTASSIUM: 3.9 meq/L (ref 3.7–5.3)
SODIUM: 139 meq/L (ref 137–147)
Total Bilirubin: 0.6 mg/dL (ref 0.3–1.2)
Total Protein: 7.2 g/dL (ref 6.0–8.3)

## 2013-08-28 LAB — LIPASE, BLOOD: Lipase: 20 U/L (ref 11–59)

## 2013-08-28 MED ORDER — IOHEXOL 300 MG/ML  SOLN
50.0000 mL | Freq: Once | INTRAMUSCULAR | Status: AC | PRN
  Administered 2013-08-28: 50 mL via ORAL

## 2013-08-28 MED ORDER — SODIUM CHLORIDE 0.9 % IV BOLUS (SEPSIS)
1000.0000 mL | Freq: Once | INTRAVENOUS | Status: AC
  Administered 2013-08-28: 1000 mL via INTRAVENOUS

## 2013-08-28 MED ORDER — HYDROCODONE-ACETAMINOPHEN 5-325 MG PO TABS: 2.0000 | ORAL_TABLET | Freq: Four times a day (QID) | ORAL | Status: DC | PRN

## 2013-08-28 MED ORDER — IOHEXOL 300 MG/ML  SOLN
100.0000 mL | Freq: Once | INTRAMUSCULAR | Status: AC | PRN
  Administered 2013-08-28: 100 mL via INTRAVENOUS

## 2013-08-28 MED ORDER — ONDANSETRON HCL 4 MG/2ML IJ SOLN
4.0000 mg | Freq: Once | INTRAMUSCULAR | Status: AC
  Administered 2013-08-28: 4 mg via INTRAVENOUS
  Filled 2013-08-28: qty 2

## 2013-08-28 NOTE — ED Notes (Signed)
MD at bedside. 

## 2013-08-28 NOTE — ED Provider Notes (Signed)
CSN: [id number]     Arrival date & time 08/28/13  1046 History   First MD Initiated Contact with Patient 08/28/13 1107     Chief Complaint  Patient presents with  . Abdominal Pain     (Consider location/radiation/quality/duration/timing/severity/associated sxs/prior Treatment) Patient is a 43 y.o. female presenting with abdominal pain.  Abdominal Pain  Pt reports onset of diffuse vague abdominal pain last night, associated with vomiting and anorexia now localized to RLQ and worsening during the day today. Pain is worse with movement and riding in a car. No fever, no dysuria. No vaginal bleeding or discharge She is s/p cholecystectomy and hysterectomy/salpingectomy but still has ovaries.  Past Medical History  Diagnosis Date  . Fibromyalgia   . Obesity   . Melanoma     right hip and knee  . Chicken pox   . Allergy   . Migraine   . UTI (lower urinary tract infection)   . GERD (gastroesophageal reflux disease)   . Anxiety disorder   . Anemia     in the past  . Depression     in teh past- ok now  . Shortness of breath     when anemic  . Complication of anesthesia 1999    urinary retention  . Neck pain on right side 12/23/2012  . Left thyroid nodule 03/31/2013  . Pain 03/31/2013  . TIA (transient ischemic attack) 03/31/2013   Past Surgical History  Procedure Laterality Date  . Back surgery    . Cholecystectomy    . Wisdom tooth extraction  1989  . Melanoma excision  2011    stage I and II  . Laparoscopic assisted vaginal hysterectomy N/A 09/13/2012    Procedure: LAPAROSCOPIC ASSISTED VAGINAL HYSTERECTOMY;  Surgeon: Lavonia Drafts, MD;  Location: [facility];  Service: Gynecology;  Laterality: N/A;  . Bilateral salpingectomy Bilateral 09/13/2012    Procedure: BILATERAL SALPINGECTOMY;  Surgeon: Lavonia Drafts, MD;  Location: [facility];  Service: Gynecology;  Laterality: Bilateral;  . Abdominal hysterectomy     Family History  Problem Relation Age of Onset  . Colon  cancer Neg Hx   . Colitis Father     crohn's  . Heart disease Father     grandfather  . Heart attack Father 27  . Arthritis Father     s/p back surgery  . Diabetes      grandfather  . Irritable bowel syndrome    . Kidney disease      grandfather  . Arthritis Other   . Cancer Other     breast  . Hyperlipidemia Other   . Mental illness Other   . Other Mother     neurological autoimmune disease, chorea in trunk  . Emphysema Maternal Uncle   . Arthritis Maternal Grandmother   . Lupus Maternal Grandmother   . Dementia Maternal Grandmother   . Arthritis Paternal Grandmother   . Heart disease Paternal Grandmother     chf  . Diabetes Paternal Grandfather   . Kidney disease Paternal Grandfather   . Hypertension Paternal Grandfather   . Hyperlipidemia Paternal Grandfather   . Cancer Maternal Grandfather     bone   History  Substance Use Topics  . Smoking status: Never Smoker   . Smokeless tobacco: Never Used  . Alcohol Use: No   OB History   Grav Para Term Preterm Abortions TAB SAB Ect Mult Living   0              Review of  Systems  Gastrointestinal: Positive for abdominal pain.   All other systems reviewed and are negative except as noted in HPI.     Allergies  Adhesive; Aleve; and Carbamazepine  Home Medications   Current Outpatient Rx  Name  Route  Sig  Dispense  Refill  . aspirin-acetaminophen-caffeine (EXCEDRIN MIGRAINE) 250-250-65 MG per tablet   Oral   Take 2 tablets by mouth every 6 (six) hours as needed for pain.          . diazepam (VALIUM) 10 MG tablet      TAKE 1 TABLET BY MOUTH EVERY 12 HOURS AS NEEDED FOR ANXIETY   30 tablet   0   . esomeprazole (NEXIUM) 40 MG capsule   Oral   Take 40 mg by mouth daily.         . Ibuprofen (ADVIL PO)   Oral   Take by mouth as needed.         . meloxicam (MOBIC) 15 MG tablet      TAKE 1 TABLET BY MOUTH EVERY DAY   30 tablet   0    BP 132/85  Pulse 88  Temp(Src) 98.1 F (36.7 C)  Resp 14   SpO2 99%  LMP 08/13/2012 Physical Exam  Nursing note and vitals reviewed. Constitutional: She is oriented to person, place, and time. She appears well-developed and well-nourished.  HENT:  Head: Normocephalic and atraumatic.  Eyes: EOM are normal. Pupils are equal, round, and reactive to light.  Neck: Normal range of motion. Neck supple.  Cardiovascular: Normal rate, normal heart sounds and intact distal pulses.   Pulmonary/Chest: Effort normal and breath sounds normal.  Abdominal: Bowel sounds are normal. She exhibits no distension. There is tenderness (RLQ). There is guarding. There is no rebound.  Musculoskeletal: Normal range of motion. She exhibits no edema and no tenderness.  Neurological: She is alert and oriented to person, place, and time. She has normal strength. No cranial nerve deficit or sensory deficit.  Skin: Skin is warm and dry. No rash noted.  Psychiatric: She has a normal mood and affect.    ED Course  Procedures (including critical care time) Labs Review Labs Reviewed  URINALYSIS, ROUTINE W REFLEX MICROSCOPIC - Abnormal; Notable for the following:    APPearance CLOUDY (*)    All other components within normal limits  COMPREHENSIVE METABOLIC PANEL - Abnormal; Notable for the following:    Glucose, Bld 101 (*)    All other components within normal limits  CBC WITH DIFFERENTIAL  LIPASE, BLOOD   Imaging Review Ct Abdomen Pelvis W Contrast  08/28/2013   CLINICAL DATA:  Right lower quadrant abdominal pain and tenderness. Partial hysterectomy and cholecystectomy. The patient vomited after administration of IV contrast but otherwise tolerated the procedure without difficulty. This is not considered a contraindication to future contrast enhanced exams.  EXAM: CT ABDOMEN AND PELVIS WITH CONTRAST  TECHNIQUE: Multidetector CT imaging of the abdomen and pelvis was performed using the standard protocol following bolus administration of intravenous contrast.  CONTRAST:  36mL  OMNIPAQUE IOHEXOL 300 MG/ML SOLN, 172mL OMNIPAQUE IOHEXOL 300 MG/ML SOLN  COMPARISON:  No similar prior exam is available at this institution for comparison or on [facility]. Pelvic ultrasound 07/02/2012 is reviewed.  FINDINGS: 3 mm right lower lobe pulmonary nodule identified image 5. Small hiatal hernia.  Scanning was performed during the excretion phase of contrast enhancement due to delay because the patient vomited. This is not felt to significantly affect the diagnostic quality of  the examination but could theoretically reduce the sensitivity and specificity for detection of tiny renal or ureteral calculi. 6.8 x 5.5 cm left mid renal cortical cyst is noted. Liver, adrenal glands, right kidney, spleen, and pancreas are unremarkable. Probable small duodenum diverticulum incidentally noted image 40. No intrahepatic or common duct dilatation. No pancreatic ductal dilatation. No ascites, free air, or lymphadenopathy.  Left ovary is normal. Status post hysterectomy. In the right adnexa, there is a dominant ovarian cyst measuring 5.6 x 4.8 cm, measuring at upper limits of fluid density, 20 Hounsfield units. Trace right adnexal and pelvic free fluid is identified. Appendix is normal, image 70. No bowel wall thickening or focal segmental dilatation. Bladder is normal. No acute osseous finding.  IMPRESSION: Prominent right ovarian cyst which could be physiologic or hemorrhagic, with trace right adnexal and pelvic free fluid. This may suggest the patient's symptoms are related to recent ovulation. Although followup of ovarian cysts according to consensus criteria was determined for ultrasound, by extension, I would suggest followup pelvic ultrasound in 4-6 weeks to document resolution of these findings. If the patient has fever, tubo-ovarian abscess could appear similar, or less likely endometriosis.  Normal appendix. No other acute intra-abdominal or pelvic pathology.   Electronically Signed   By: Conchita Paris M.D.    On: 08/28/2013 12:57    EKG Interpretation   None       MDM   Final diagnoses:  Ruptured ovarian cyst    Labs and CT images reviewed. Neg for appendicitis. Ovarian cyst with ?rupture to explain pain. Pt drove herself so deferred pain meds. Will d/c with Gyn follow up for recheck. Return for any other concerns.     Charles B. Karle Starch, MD 08/28/13 1310

## 2013-08-28 NOTE — ED Notes (Signed)
Abdominal pain that started yesterday at 5pm, vomited at 2300 and pain worsened this am.

## 2013-08-28 NOTE — Discharge Instructions (Signed)

## 2013-08-28 NOTE — ED Notes (Signed)
Pt is drinking PO contrast 

## 2013-08-28 NOTE — ED Notes (Signed)
Patient transported to CT 

## 2013-08-30 ENCOUNTER — Ambulatory Visit: Payer: No Typology Code available for payment source | Admitting: Family

## 2013-08-30 ENCOUNTER — Ambulatory Visit: Payer: Self-pay | Admitting: Family

## 2013-09-02 ENCOUNTER — Encounter: Payer: Self-pay | Admitting: Obstetrics & Gynecology

## 2013-09-02 ENCOUNTER — Ambulatory Visit (INDEPENDENT_AMBULATORY_CARE_PROVIDER_SITE_OTHER): Payer: No Typology Code available for payment source | Admitting: Obstetrics & Gynecology

## 2013-09-02 VITALS — BP 116/81 | HR 71 | Temp 97.0°F | Ht 69.0 in | Wt 286.0 lb

## 2013-09-02 DIAGNOSIS — N83209 Unspecified ovarian cyst, unspecified side: Secondary | ICD-10-CM

## 2013-09-02 NOTE — Patient Instructions (Signed)

## 2013-09-02 NOTE — Progress Notes (Signed)
Subjective:     Patient ID: [name], female   DOB: 1970/09/11, 43 y.o.   MRN: [id number]  HPI  Pt was seen in the [facility] ED with complaints of right sided pelvic pain. She reports that she didn't want to go to the Ed but her clinical instructor told her that it could be appendicitis.  Her sx have almost completely resolved at present.  She occ feels a small tinge of pain with voiding.  She reports that the pain started slowly and then became acute and then decreased very quickly. She denies n/v/f/c.  She reports that she did not need to take any of the meds prescribed.   Past Surgical History  Procedure Laterality Date  . Back surgery    . Cholecystectomy    . Wisdom tooth extraction  1989  . Melanoma excision  2011    stage I and II  . Laparoscopic assisted vaginal hysterectomy N/A 09/13/2012    Procedure: LAPAROSCOPIC ASSISTED VAGINAL HYSTERECTOMY;  Surgeon: Lavonia Drafts, MD;  Location: [facility];  Service: Gynecology;  Laterality: N/A;  . Bilateral salpingectomy Bilateral 09/13/2012    Procedure: BILATERAL SALPINGECTOMY;  Surgeon: Lavonia Drafts, MD;  Location: [facility];  Service: Gynecology;  Laterality: Bilateral;  . Abdominal hysterectomy        Review of Systems     Objective:   Physical Exam BP 116/81  Pulse 71  Temp(Src) 97 F (36.1 C) (Oral)  Ht 5\' 9"  (1.753 m)  Wt 286 lb (129.729 kg)  BMI 42.22 kg/m2  LMP 08/13/2012 Pt in NAD Abd: obese, NT, ND  08/28/2013 CLINICAL DATA: Right lower quadrant abdominal pain and tenderness.  Partial hysterectomy and cholecystectomy. The patient vomited after  administration of IV contrast but otherwise tolerated the procedure  without difficulty. This is not considered a contraindication to  future contrast enhanced exams.  EXAM:  CT ABDOMEN AND PELVIS WITH CONTRAST  TECHNIQUE:  Multidetector CT imaging of the abdomen and pelvis was performed  using the standard protocol following bolus administration of   intravenous contrast.  CONTRAST: 86mL OMNIPAQUE IOHEXOL 300 MG/ML SOLN, 138mL OMNIPAQUE  IOHEXOL 300 MG/ML SOLN  COMPARISON: No similar prior exam is available at this institution  for comparison or on [facility]. Pelvic ultrasound 07/02/2012 is  reviewed.  FINDINGS:  3 mm right lower lobe pulmonary nodule identified image 5. Small  hiatal hernia.  Scanning was performed during the excretion phase of contrast  enhancement due to delay because the patient vomited. This is not  felt to significantly affect the diagnostic quality of the  examination but could theoretically reduce the sensitivity and  specificity for detection of tiny renal or ureteral calculi. 6.8 x  5.5 cm left mid renal cortical cyst is noted. Liver, adrenal glands,  right kidney, spleen, and pancreas are unremarkable. Probable small  duodenum diverticulum incidentally noted image 40. No intrahepatic  or common duct dilatation. No pancreatic ductal dilatation. No  ascites, free air, or lymphadenopathy.  Left ovary is normal. Status post hysterectomy. In the right adnexa,  there is a dominant ovarian cyst measuring 5.6 x 4.8 cm, measuring  at upper limits of fluid density, 20 Hounsfield units. Trace right  adnexal and pelvic free fluid is identified. Appendix is normal,  image 70. No bowel wall thickening or focal segmental dilatation.  Bladder is normal. No acute osseous finding.  IMPRESSION:  Prominent right ovarian cyst which could be physiologic or  hemorrhagic, with trace right adnexal and pelvic free  fluid. This  may suggest the patient's symptoms are related to recent ovulation.  Although followup of ovarian cysts according to consensus criteria  was determined for ultrasound, by extension, I would suggest  followup pelvic ultrasound in 4-6 weeks to document resolution of  these findings. If the patient has fever, tubo-ovarian abscess could  appear similar, or less likely endometriosis.  Normal appendix. No  other acute intra-abdominal or pelvic pathology.       Assessment:     Pelvic pain- almost completely resolved.   Probable ruptured ovarian cyst.  Will not repeat study as sx resolved     Plan:     Pt to call if sx return

## 2013-09-03 ENCOUNTER — Telehealth: Payer: Self-pay | Admitting: General Practice

## 2013-09-03 NOTE — Telephone Encounter (Signed)
Patient left message stating she was seen here yesterday by Dr Ihor Dow for an ER followup and this morning she found a pea-sized knot on her vulva and it is now painful and swollen and makes it hard for her to sit. Called patient back and she stated she has had these in the past before but forgot to show her yesterday when she was here. Patient denies current shaving/waxing but has in the past. Discussed with patient warm baths but preferably warm compresses to the affected area and loose clothing and to call us back if she does not see improvement. Patient verbalized understanding and had no further questions

## 2013-09-23 ENCOUNTER — Telehealth: Payer: Self-pay | Admitting: Internal Medicine

## 2013-09-23 ENCOUNTER — Other Ambulatory Visit: Payer: Self-pay | Admitting: Family Medicine

## 2013-09-23 NOTE — Telephone Encounter (Signed)
Can have 10 tabs but needs appt for more since has not been seen in over 6 months

## 2013-09-23 NOTE — Telephone Encounter (Signed)
Pt last seen in September and advised f/u in 6 weeks. Has no future appts on file with [place]. Last refill of diazepam 08/13/13. Please advise:  Medication name:  Name from pharmacy:  diazepam (VALIUM) 10 MG tablet  DIAZEPAM 10MG  TABLETS Sig: TAKE 1 TABLET BY MOUTH EVERY 12 HOURS AS NEEDED FOR ANXIETY OR SLEEP Dispense: 45 tablet Refills: 0 Start: 09/23/2013 Class: Normal Requested on: 09/23/2013 Originally ordered on: 12/21/2012 Last refill: 08/13/2013

## 2013-09-23 NOTE — Telephone Encounter (Signed)
RX Sent. 

## 2013-09-23 NOTE — Telephone Encounter (Signed)
Patient advised that we have no Nexium samples. I offered to send a prescription to the pharmacy for her but she states that it is "too expensive."

## 2013-09-26 ENCOUNTER — Ambulatory Visit (INDEPENDENT_AMBULATORY_CARE_PROVIDER_SITE_OTHER): Payer: No Typology Code available for payment source | Admitting: Family Medicine

## 2013-09-26 ENCOUNTER — Encounter: Payer: Self-pay | Admitting: Family Medicine

## 2013-09-26 VITALS — BP 108/70 | HR 76 | Temp 98.2°F | Ht 69.0 in | Wt 285.0 lb

## 2013-09-26 DIAGNOSIS — E785 Hyperlipidemia, unspecified: Secondary | ICD-10-CM

## 2013-09-26 DIAGNOSIS — F411 Generalized anxiety disorder: Secondary | ICD-10-CM

## 2013-09-26 DIAGNOSIS — R609 Edema, unspecified: Secondary | ICD-10-CM

## 2013-09-26 DIAGNOSIS — R7309 Other abnormal glucose: Secondary | ICD-10-CM

## 2013-09-26 DIAGNOSIS — E781 Pure hyperglyceridemia: Secondary | ICD-10-CM

## 2013-09-26 DIAGNOSIS — E669 Obesity, unspecified: Secondary | ICD-10-CM

## 2013-09-26 DIAGNOSIS — D509 Iron deficiency anemia, unspecified: Secondary | ICD-10-CM

## 2013-09-26 DIAGNOSIS — R739 Hyperglycemia, unspecified: Secondary | ICD-10-CM

## 2013-09-26 DIAGNOSIS — D649 Anemia, unspecified: Secondary | ICD-10-CM

## 2013-09-26 DIAGNOSIS — K219 Gastro-esophageal reflux disease without esophagitis: Secondary | ICD-10-CM

## 2013-09-26 DIAGNOSIS — R600 Localized edema: Secondary | ICD-10-CM

## 2013-09-26 LAB — CBC
HEMATOCRIT: 42.9 % (ref 36.0–46.0)
Hemoglobin: 14.6 g/dL (ref 12.0–15.0)
MCH: 30.4 pg (ref 26.0–34.0)
MCHC: 34 g/dL (ref 30.0–36.0)
MCV: 89.4 fL (ref 78.0–100.0)
Platelets: 267 10*3/uL (ref 150–400)
RBC: 4.8 MIL/uL (ref 3.87–5.11)
RDW: 13.1 % (ref 11.5–15.5)
WBC: 7.1 10*3/uL (ref 4.0–10.5)

## 2013-09-26 MED ORDER — DIAZEPAM 10 MG PO TABS: 10.0000 mg | ORAL_TABLET | Freq: Two times a day (BID) | ORAL | Status: DC | PRN

## 2013-09-26 NOTE — Progress Notes (Signed)
Pre visit review using our clinic review tool, if applicable. No additional management support is needed unless otherwise documented below in the visit note. 

## 2013-09-26 NOTE — Patient Instructions (Signed)
Probiotics daily such as [employer] caps daily

## 2013-09-27 ENCOUNTER — Other Ambulatory Visit (HOSPITAL_BASED_OUTPATIENT_CLINIC_OR_DEPARTMENT_OTHER): Payer: No Typology Code available for payment source | Admitting: Lab

## 2013-09-27 ENCOUNTER — Ambulatory Visit (HOSPITAL_BASED_OUTPATIENT_CLINIC_OR_DEPARTMENT_OTHER): Payer: No Typology Code available for payment source | Admitting: Hematology & Oncology

## 2013-09-27 ENCOUNTER — Ambulatory Visit (HOSPITAL_BASED_OUTPATIENT_CLINIC_OR_DEPARTMENT_OTHER): Payer: No Typology Code available for payment source

## 2013-09-27 ENCOUNTER — Encounter: Payer: Self-pay | Admitting: Hematology & Oncology

## 2013-09-27 VITALS — BP 110/76 | HR 60 | Temp 98.0°F | Resp 14 | Ht 68.0 in | Wt 285.0 lb

## 2013-09-27 DIAGNOSIS — D509 Iron deficiency anemia, unspecified: Secondary | ICD-10-CM

## 2013-09-27 DIAGNOSIS — K219 Gastro-esophageal reflux disease without esophagitis: Secondary | ICD-10-CM

## 2013-09-27 LAB — COMPREHENSIVE METABOLIC PANEL
ALBUMIN: 3.9 g/dL (ref 3.5–5.2)
ALK PHOS: 77 U/L (ref 39–117)
ALT: 11 U/L (ref 0–35)
AST: 10 U/L (ref 0–37)
BILIRUBIN TOTAL: 0.3 mg/dL (ref 0.2–1.2)
BUN: 12 mg/dL (ref 6–23)
CO2: 23 mEq/L (ref 19–32)
CREATININE: 0.71 mg/dL (ref 0.50–1.10)
Calcium: 9.1 mg/dL (ref 8.4–10.5)
Chloride: 106 mEq/L (ref 96–112)
GLUCOSE: 84 mg/dL (ref 70–99)
POTASSIUM: 4.2 meq/L (ref 3.5–5.3)
Sodium: 138 mEq/L (ref 135–145)
Total Protein: 6.6 g/dL (ref 6.0–8.3)

## 2013-09-27 LAB — LIPID PANEL
CHOL/HDL RATIO: 5 ratio
CHOLESTEROL: 191 mg/dL (ref 0–200)
HDL: 38 mg/dL — AB (ref >39)
LDL CALC: 114 mg/dL — AB (ref 0–99)
Triglycerides: 194 mg/dL — ABNORMAL HIGH (ref <150)
VLDL: 39 mg/dL (ref 0–40)

## 2013-09-27 LAB — CBC WITH DIFFERENTIAL (CANCER CENTER ONLY)
BASO#: 0 10*3/uL (ref 0.0–0.2)
BASO%: 0.1 % (ref 0.0–2.0)
EOS%: 1.1 % (ref 0.0–7.0)
Eosinophils Absolute: 0.1 10*3/uL (ref 0.0–0.5)
HEMATOCRIT: 41.9 % (ref 34.8–46.6)
HEMOGLOBIN: 14.2 g/dL (ref 11.6–15.9)
LYMPH#: 1.3 10*3/uL (ref 0.9–3.3)
LYMPH%: 17.7 % (ref 14.0–48.0)
MCH: 30.5 pg (ref 26.0–34.0)
MCHC: 33.9 g/dL (ref 32.0–36.0)
MCV: 90 fL (ref 81–101)
MONO#: 0.5 10*3/uL (ref 0.1–0.9)
MONO%: 7.1 % (ref 0.0–13.0)
NEUT#: 5.4 10*3/uL (ref 1.5–6.5)
NEUT%: 74 % (ref 39.6–80.0)
PLATELETS: 239 10*3/uL (ref 145–400)
RBC: 4.65 10*6/uL (ref 3.70–5.32)
RDW: 12.4 % (ref 11.1–15.7)
WBC: 7.4 10*3/uL (ref 3.9–10.0)

## 2013-09-27 LAB — CHCC SATELLITE - SMEAR

## 2013-09-27 LAB — IRON AND TIBC CHCC
%SAT: 33 % (ref 21–57)
Iron: 80 ug/dL (ref 41–142)
TIBC: 241 ug/dL (ref 236–444)
UIBC: 162 ug/dL (ref 120–384)

## 2013-09-27 LAB — FERRITIN CHCC: Ferritin: 85 ng/ml (ref 9–269)

## 2013-09-27 LAB — RETICULOCYTES (CHCC)
ABS Retic: 50.7 10*3/uL (ref 19.0–186.0)
RBC.: 4.61 MIL/uL (ref 3.87–5.11)
Retic Ct Pct: 1.1 % (ref 0.4–2.3)

## 2013-09-27 LAB — IRON: Iron: 76 ug/dL (ref 42–145)

## 2013-09-27 LAB — TSH: TSH: 1.281 u[IU]/mL (ref 0.350–4.500)

## 2013-09-27 LAB — FERRITIN: Ferritin: 101 ng/mL (ref 10–291)

## 2013-09-27 MED ORDER — SODIUM CHLORIDE 0.9 % IV SOLN
1020.0000 mg | Freq: Once | INTRAVENOUS | Status: AC
  Administered 2013-09-27: 1020 mg via INTRAVENOUS
  Filled 2013-09-27: qty 34

## 2013-09-27 MED ORDER — SODIUM CHLORIDE 0.9 % IV SOLN
Freq: Once | INTRAVENOUS | Status: AC
  Administered 2013-09-27: 10:00:00 via INTRAVENOUS

## 2013-09-27 MED ORDER — METOCLOPRAMIDE HCL 10 MG PO TABS: 10.0000 mg | ORAL_TABLET | Freq: Four times a day (QID) | ORAL | Status: DC

## 2013-09-27 NOTE — Patient Instructions (Signed)

## 2013-09-28 NOTE — Progress Notes (Signed)
  DIAGNOSIS: Intermittent iron deficiency anemia  CURRENT THERAPY:  IV iron   INTERIM HISTORY: It has been a few months as we last saw her. She is doing okay. She feels very tired. We saw her back in December. She had a borderline iron level. We did not give her iron but then.  Unfortunately, she is going to lose her insurance for a year. We will go ahead and give her iron today.  She feels tired. She has had no bleeding. She's had no nausea or vomiting. She's had no weight loss or weight gain. She's had no rashes. She's had no leg swelling.  PHYSICAL EXAMINATION:  Obese white female. Her weight is 285 pounds. Temperature 98.6. Blood pressure 100/76. Pulse 68. No lymph nodes are noted in her neck. Lungs are clear. Cardiac exam regular rate and rhythm. Abdomen is obese. No liver or spleen. Back exam no tenderness over the spine. Extremities no clubbing cyanosis or edema. Skin exam no rashes.   LABORATORY STUDIES:  In the chart   IMPRESSION:  43 year old female. She in will be given on today. We will go ahead and and and and and and have her come back when she can. She says she 1 by mouth to come back for a year. This is okay. She says that she will have any insurance. I understand this.    Again, we gave her iron today. We gave her Feraheme at a dose of 1020 mg. She tolerated this well.   Volanda Napoleon, MD 09/28/2013

## 2013-09-29 ENCOUNTER — Encounter: Payer: Self-pay | Admitting: Family Medicine

## 2013-09-29 NOTE — Assessment & Plan Note (Signed)
Encouraged heart healthy diet, increase exercise, avoid trans fats, consider a krill oil cap daily 

## 2013-09-29 NOTE — Assessment & Plan Note (Signed)
Avoid offending foods, start probiotics. Do not eat large meals in late evening and consider raising head of bed. Continue PPI

## 2013-09-29 NOTE — Assessment & Plan Note (Signed)
Encouraged DASH diet, decrease po intake and increase exercise as tolerated. Needs 7-8 hours of sleep nightly. Avoid trans fats, eat small, frequent meals every 4-5 hours with lean proteins, complex carbs and healthy fats. Minimize simple carbs, GMO foods. 

## 2013-09-29 NOTE — Assessment & Plan Note (Signed)
Following with heme/onc check ferritin, cbc today

## 2013-09-29 NOTE — Assessment & Plan Note (Signed)
Minimize sodium, elevate feet. Compression hose 

## 2013-09-29 NOTE — Progress Notes (Signed)
Patient ID: Evalyn Casco, female   DOB: 06-Mar-1971, 43 y.o.   MRN: [id number] QUINISHA CERMINARA [id number] 1971-01-05 09/29/2013      Progress Note-Follow Up  Subjective  Chief Complaint  Chief Complaint  Patient presents with  . Follow-up    med refills    HPI  Patient is a 43 year old female in today for routine medical care. Reports recent blood sugars 235 or lower. No polyuria or polydipsia. Continues to struggle with fatigue. Denies CP/palp/SOB/HA/congestion/fevers/GI or GU c/o. Taking meds as prescribed  Past Medical History  Diagnosis Date  . Fibromyalgia   . Obesity   . Melanoma     right hip and knee  . Chicken pox   . Allergy   . Migraine   . UTI (lower urinary tract infection)   . GERD (gastroesophageal reflux disease)   . Anxiety disorder   . Anemia     in the past  . Depression     in teh past- ok now  . Shortness of breath     when anemic  . Complication of anesthesia 1999    urinary retention  . Neck pain on right side 12/23/2012  . Left thyroid nodule 03/31/2013  . Pain 03/31/2013  . TIA (transient ischemic attack) 03/31/2013    Past Surgical History  Procedure Laterality Date  . Back surgery    . Cholecystectomy    . Wisdom tooth extraction  1989  . Melanoma excision  2011    stage I and II  . Laparoscopic assisted vaginal hysterectomy N/A 09/13/2012    Procedure: LAPAROSCOPIC ASSISTED VAGINAL HYSTERECTOMY;  Surgeon: Lavonia Drafts, MD;  Location: [facility];  Service: Gynecology;  Laterality: N/A;  . Bilateral salpingectomy Bilateral 09/13/2012    Procedure: BILATERAL SALPINGECTOMY;  Surgeon: Lavonia Drafts, MD;  Location: [facility];  Service: Gynecology;  Laterality: Bilateral;  . Abdominal hysterectomy      Family History  Problem Relation Age of Onset  . Colon cancer Neg Hx   . Colitis Father     crohn's  . Heart disease Father     grandfather  . Heart attack Father 74  . Arthritis Father     s/p back surgery  . Diabetes     grandfather  . Irritable bowel syndrome    . Kidney disease      grandfather  . Arthritis Other   . Cancer Other     breast  . Hyperlipidemia Other   . Mental illness Other   . Other Mother     neurological autoimmune disease, chorea in trunk  . Emphysema Maternal Uncle   . Arthritis Maternal Grandmother   . Lupus Maternal Grandmother   . Dementia Maternal Grandmother   . Arthritis Paternal Grandmother   . Heart disease Paternal Grandmother     chf  . Diabetes Paternal Grandfather   . Kidney disease Paternal Grandfather   . Hypertension Paternal Grandfather   . Hyperlipidemia Paternal Grandfather   . Cancer Maternal Grandfather     bone    History   Social History  . Marital Status: Divorced    Spouse Name: N/A    Number of Children: N/A  . Years of Education: N/A   Occupational History  . unemployed    Social History Main Topics  . Smoking status: Never Smoker   . Smokeless tobacco: Never Used     Comment: never used tobacco  . Alcohol Use: No  . Drug Use: No  . Sexual  Activity: Not Currently    Birth Control/ Protection: None   Other Topics Concern  . Not on file   Social History Narrative   Daily caffeine    Current Outpatient Prescriptions on File Prior to Visit  Medication Sig Dispense Refill  . esomeprazole (NEXIUM) 40 MG capsule Take 40 mg by mouth daily.       No current facility-administered medications on file prior to visit.    Allergies  Allergen Reactions  . Adhesive [Tape] Dermatitis    Band- Aid adhesive  . Aleve [Naproxen Sodium]     Makes hands, legs, and feet swell  . Carbamazepine     Hallucinations and dizziness    Review of Systems  Review of Systems  Constitutional: Positive for malaise/fatigue. Negative for fever.  HENT: Negative for congestion.   Eyes: Negative for discharge.  Respiratory: Negative for shortness of breath.   Cardiovascular: Negative for chest pain, palpitations and leg swelling.  Gastrointestinal:  Negative for nausea, abdominal pain and diarrhea.  Genitourinary: Negative for dysuria.  Musculoskeletal: Negative for falls.  Skin: Negative for rash.  Neurological: Negative for loss of consciousness and headaches.  Endo/Heme/Allergies: Negative for polydipsia.  Psychiatric/Behavioral: Negative for depression and suicidal ideas. The patient is not nervous/anxious and does not have insomnia.     Objective  BP 108/70  Pulse 76  Temp(Src) 98.2 F (36.8 C) (Oral)  Ht 5\' 9"  (1.753 m)  Wt 285 lb (129.275 kg)  BMI 42.07 kg/m2  SpO2 96%  LMP 08/13/2012  Physical Exam  Physical Exam  Constitutional: She is oriented to person, place, and time and well-developed, well-nourished, and in no distress. No distress.  HENT:  Head: Normocephalic and atraumatic.  Eyes: Conjunctivae are normal.  Neck: Neck supple. No thyromegaly present.  Cardiovascular: Normal rate, regular rhythm and normal heart sounds.   No murmur heard. Pulmonary/Chest: Effort normal and breath sounds normal. She has no wheezes.  Abdominal: She exhibits no distension and no mass.  Musculoskeletal: She exhibits no edema.  Lymphadenopathy:    She has no cervical adenopathy.  Neurological: She is alert and oriented to person, place, and time.  Skin: Skin is warm and dry. No rash noted. She is not diaphoretic.  Psychiatric: Memory, affect and judgment normal.    Lab Results  Component Value Date   TSH 1.281 09/26/2013   Lab Results  Component Value Date   WBC 7.4 09/27/2013   HGB 14.2 09/27/2013   HCT 41.9 09/27/2013   MCV 90 09/27/2013   PLT 239 09/27/2013   Lab Results  Component Value Date   CREATININE 0.71 09/26/2013   BUN 12 09/26/2013   NA 138 09/26/2013   K 4.2 09/26/2013   CL 106 09/26/2013   CO2 23 09/26/2013   Lab Results  Component Value Date   ALT 11 09/26/2013   AST 10 09/26/2013   ALKPHOS 77 09/26/2013   BILITOT 0.3 09/26/2013   Lab Results  Component Value Date   CHOL 191 09/26/2013   Lab Results   Component Value Date   HDL 38* 09/26/2013   Lab Results  Component Value Date   LDLCALC 114* 09/26/2013   Lab Results  Component Value Date   TRIG 194* 09/26/2013   Lab Results  Component Value Date   CHOLHDL 5.0 09/26/2013     Assessment & Plan  ANEMIA, IRON DEFICIENCY Following with heme/onc check ferritin, cbc today  GERD Avoid offending foods, start probiotics. Do not eat large meals in late evening  and consider raising head of bed. Continue PPI  Hypertriglyceridemia Encouraged heart healthy diet, increase exercise, avoid trans fats, consider a krill oil cap daily  Lower extremity edema Minimize sodium, elevate feet. Compression hose  Obesity Encouraged DASH diet, decrease po intake and increase exercise as tolerated. Needs 7-8 hours of sleep nightly. Avoid trans fats, eat small, frequent meals every 4-5 hours with lean proteins, complex carbs and healthy fats. Minimize simple carbs, GMO foods.  Hyperglycemia minimize simple carbs. Increase exercise as tolerated.

## 2013-09-29 NOTE — Assessment & Plan Note (Signed)
minimize simple carbs. Increase exercise as tolerated.  

## 2013-10-03 ENCOUNTER — Encounter: Payer: Self-pay | Admitting: Family Medicine

## 2013-10-03 ENCOUNTER — Telehealth: Payer: Self-pay | Admitting: *Deleted

## 2013-10-03 NOTE — Telephone Encounter (Signed)
Please advise 

## 2013-10-03 NOTE — Telephone Encounter (Signed)
Janah called and left a message stating she is a patient of Dr. 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 States she has a project due soon and it is on the medicine nuvaring and wants to know if there are any pamphlets she can get and or a sample to use for her project.  Called [facility] and left a message and that we got her message and we have the information she needs- just come to clinic during office hours. ( don't think we can give her a sample , but didin't say that on message) call back if still need to talk to [place] first.

## 2013-10-03 NOTE — Telephone Encounter (Signed)
Hey, I updated FH but [name] said we aren't allowed to adjust PMH?

## 2013-11-02 ENCOUNTER — Other Ambulatory Visit: Payer: Self-pay | Admitting: Family Medicine

## 2013-11-04 NOTE — Telephone Encounter (Signed)
Check with patient if she requested she can have a refill with same sig, same strength and same #

## 2013-11-04 NOTE — Telephone Encounter (Signed)
Please advise RX? It doesn't look like the patient is still taking this?

## 2013-11-04 NOTE — Telephone Encounter (Signed)
Patient states she does still take this every once in awhile. RX sent

## 2013-11-15 NOTE — Telephone Encounter (Signed)
Gave samples of Nexium 

## 2013-11-15 NOTE — Telephone Encounter (Signed)
Gave samples of Nexium

## 2013-12-18 ENCOUNTER — Telehealth (INDEPENDENT_AMBULATORY_CARE_PROVIDER_SITE_OTHER): Payer: Self-pay | Admitting: Surgery

## 2013-12-18 DIAGNOSIS — E042 Nontoxic multinodular goiter: Secondary | ICD-10-CM

## 2013-12-18 NOTE — Telephone Encounter (Addendum)
Message copied by Earnstine Regal on Wed Dec 18, 2013  9:49 AM ------      Message from: Dois Davenport      Created: Tue Dec 10, 2013  2:00 PM      Regarding: FW: LTF      Contact: [phone]       Will forward this to Dr Harlow Asa for review.      ----- Message -----         From: Joselyn Arrow         Sent: 12/10/2013   1:48 PM           To: Dois Davenport, LPN      Subject: LTF                                                      CALL PT/REQ IF SHE NEEDS APPT WITH [facility].  COULD SHE JUST BE FOLLOWED WITH [place]?  PT NOW SELF PAY/NO INS/GM       ------  [facility]  Patient has contacted office regarding further followup. She is limited by financial concerns.  In 2014 she was seen for multiple thyroid nodules. She underwent fine-needle aspiration biopsy of the dominant nodule which showed a benign follicular lesion. There was no evidence of malignancy and no worrisome findings. TSH level was normal. I recommended a followup ultrasound in 6 months.  Patient could certainly be followed by her primary care physician. She needs a regular physical examination. I would suggest a repeat ultrasound in 6-12 months. If this is stable, then I believe the patient can be followed clinically.  Patient should return for surgical care as needed.  Earnstine Regal, MD, [facility] Office: [phone]

## 2013-12-18 NOTE — Telephone Encounter (Signed)
Please let patient know we can order her repeat Ultrasound in 6 to 12 months if she would like [place] to.

## 2013-12-18 NOTE — Telephone Encounter (Signed)
Informed pt of Dr Gala Lewandowsky message: Patient could certainly be followed by her primary care physician. She needs a regular physical examination. I would suggest a repeat ultrasound in 6-12 months. Pt verbalized understanding and she is very thankful Dr Gala Lewandowsky help

## 2013-12-18 NOTE — Telephone Encounter (Signed)
LMOM for pt to call back to receive msg attached from Dr Harlow Asa re: f/u recommendations.

## 2013-12-19 ENCOUNTER — Telehealth: Payer: Self-pay

## 2013-12-19 NOTE — Telephone Encounter (Signed)
Opened in errror 

## 2013-12-19 NOTE — Telephone Encounter (Signed)
Please inform pt of Dr Frederik Pear message

## 2013-12-19 NOTE — Telephone Encounter (Signed)
Patient states that she does want [place] to order the repeat ultrasound. She states that her 100% [employer] discount expired in March and she no longer has insurance. She wants to know how much this would cost.

## 2013-12-19 NOTE — Telephone Encounter (Signed)
[place] can you figure out where pt will need to call to figure out price?

## 2013-12-23 ENCOUNTER — Encounter (INDEPENDENT_AMBULATORY_CARE_PROVIDER_SITE_OTHER): Payer: Self-pay | Admitting: Surgery

## 2013-12-25 ENCOUNTER — Telehealth: Payer: Self-pay

## 2013-12-25 NOTE — Telephone Encounter (Signed)
Message copied by Varney Daily on Wed Dec 25, 2013 10:50 AM ------      Message from: Laren Everts      Created: Wed Dec 25, 2013 10:44 AM       I called and left the patient the Pre-service center number [phone]) they should be able to tell her where within the system she can get it the cheapest.      ----- Message -----         From: Mosie Lukes, MD         Sent: 12/20/2013  10:47 AM           To: Marcelina Morel, RMA            I honestly do not know for sure. I am fine for either one. I do not know if [facility] will take an order directly from me to have one done? Also which ever is cheapest for the patient would be my driving factor. Have patient check prices for cash pay both places and then if she wants [facility] we will have to make sure they will take my order. Thanks Dr B      ----- Message -----         From: Varney Daily, RMA         Sent: 12/19/2013   2:58 PM           To: Mosie Lukes, MD                        ----- Message -----         From: Laren Everts         Sent: 12/19/2013   2:52 PM           To: Varney Daily, RMA            Hey,       Would we be ordering the Ultrasound for here at the [facility] or at [facility]?            Thanks!             ------

## 2013-12-25 NOTE — Telephone Encounter (Signed)
FYI

## 2013-12-27 ENCOUNTER — Other Ambulatory Visit: Payer: Self-pay | Admitting: Family Medicine

## 2013-12-27 ENCOUNTER — Telehealth: Payer: Self-pay | Admitting: Family Medicine

## 2013-12-27 DIAGNOSIS — E041 Nontoxic single thyroid nodule: Secondary | ICD-10-CM

## 2013-12-27 NOTE — Telephone Encounter (Signed)
Patient contacted the pre-service center and they gave her a quote to have the Thyroid US (78469) at [facility], she would like to go ahead with having that scheduled. I informed [name] would call her Monday to schedule if you can place the order

## 2013-12-27 NOTE — Telephone Encounter (Signed)
ordered

## 2013-12-29 ENCOUNTER — Encounter: Payer: Self-pay | Admitting: Family Medicine

## 2013-12-31 ENCOUNTER — Ambulatory Visit (HOSPITAL_BASED_OUTPATIENT_CLINIC_OR_DEPARTMENT_OTHER)
Admission: RE | Admit: 2013-12-31 | Discharge: 2013-12-31 | Disposition: A | Discharge status: Home / Self Care | Payer: Self-pay | Source: Ambulatory Visit | Attending: Family Medicine | Admitting: Family Medicine

## 2013-12-31 DIAGNOSIS — E041 Nontoxic single thyroid nodule: Secondary | ICD-10-CM

## 2013-12-31 DIAGNOSIS — E042 Nontoxic multinodular goiter: Secondary | ICD-10-CM | POA: Insufficient documentation

## 2014-01-01 ENCOUNTER — Ambulatory Visit (HOSPITAL_BASED_OUTPATIENT_CLINIC_OR_DEPARTMENT_OTHER): Payer: No Typology Code available for payment source

## 2014-03-13 ENCOUNTER — Other Ambulatory Visit: Payer: Self-pay | Admitting: Family Medicine

## 2014-03-13 DIAGNOSIS — Z1231 Encounter for screening mammogram for malignant neoplasm of breast: Secondary | ICD-10-CM

## 2014-03-19 ENCOUNTER — Ambulatory Visit (HOSPITAL_COMMUNITY)
Admission: RE | Admit: 2014-03-19 | Discharge: 2014-03-19 | Disposition: A | Discharge status: Home / Self Care | Payer: No Typology Code available for payment source | Source: Ambulatory Visit | Attending: Family Medicine | Admitting: Family Medicine

## 2014-03-19 DIAGNOSIS — Z1231 Encounter for screening mammogram for malignant neoplasm of breast: Secondary | ICD-10-CM

## 2014-05-02 ENCOUNTER — Other Ambulatory Visit: Payer: Self-pay

## 2014-07-28 LAB — LIPID PANEL: CHOLESTEROL: 219 mg/dL — AB (ref 0–200)

## 2014-07-31 ENCOUNTER — Other Ambulatory Visit: Payer: Self-pay | Admitting: Family

## 2014-07-31 ENCOUNTER — Ambulatory Visit (HOSPITAL_BASED_OUTPATIENT_CLINIC_OR_DEPARTMENT_OTHER): Payer: Self-pay | Admitting: Family

## 2014-07-31 ENCOUNTER — Other Ambulatory Visit (HOSPITAL_BASED_OUTPATIENT_CLINIC_OR_DEPARTMENT_OTHER): Payer: Self-pay | Admitting: Lab

## 2014-07-31 ENCOUNTER — Encounter: Payer: Self-pay | Admitting: Family

## 2014-07-31 ENCOUNTER — Ambulatory Visit (HOSPITAL_BASED_OUTPATIENT_CLINIC_OR_DEPARTMENT_OTHER): Payer: Self-pay

## 2014-07-31 ENCOUNTER — Ambulatory Visit (INDEPENDENT_AMBULATORY_CARE_PROVIDER_SITE_OTHER): Payer: Self-pay | Admitting: Family Medicine

## 2014-07-31 ENCOUNTER — Encounter: Payer: Self-pay | Admitting: Family Medicine

## 2014-07-31 ENCOUNTER — Telehealth: Payer: Self-pay | Admitting: Hematology & Oncology

## 2014-07-31 VITALS — BP 110/61 | HR 70 | Temp 97.5°F | Resp 16

## 2014-07-31 VITALS — BP 94/58 | HR 82 | Temp 98.1°F | Ht 68.0 in | Wt 289.8 lb

## 2014-07-31 DIAGNOSIS — F419 Anxiety disorder, unspecified: Secondary | ICD-10-CM

## 2014-07-31 DIAGNOSIS — D649 Anemia, unspecified: Secondary | ICD-10-CM

## 2014-07-31 DIAGNOSIS — E669 Obesity, unspecified: Secondary | ICD-10-CM

## 2014-07-31 DIAGNOSIS — D509 Iron deficiency anemia, unspecified: Secondary | ICD-10-CM

## 2014-07-31 DIAGNOSIS — K219 Gastro-esophageal reflux disease without esophagitis: Secondary | ICD-10-CM

## 2014-07-31 DIAGNOSIS — M549 Dorsalgia, unspecified: Secondary | ICD-10-CM

## 2014-07-31 DIAGNOSIS — F32A Depression, unspecified: Secondary | ICD-10-CM

## 2014-07-31 DIAGNOSIS — R002 Palpitations: Secondary | ICD-10-CM

## 2014-07-31 DIAGNOSIS — F329 Major depressive disorder, single episode, unspecified: Secondary | ICD-10-CM

## 2014-07-31 DIAGNOSIS — R739 Hyperglycemia, unspecified: Secondary | ICD-10-CM

## 2014-07-31 DIAGNOSIS — E079 Disorder of thyroid, unspecified: Secondary | ICD-10-CM

## 2014-07-31 DIAGNOSIS — F418 Other specified anxiety disorders: Secondary | ICD-10-CM

## 2014-07-31 LAB — IRON AND TIBC CHCC
%SAT: 35 % (ref 21–57)
Iron: 94 ug/dL (ref 41–142)
TIBC: 265 ug/dL (ref 236–444)
UIBC: 171 ug/dL (ref 120–384)

## 2014-07-31 LAB — CBC WITH DIFFERENTIAL (CANCER CENTER ONLY)
BASO#: 0 10*3/uL (ref 0.0–0.2)
BASO%: 0.4 % (ref 0.0–2.0)
EOS%: 0.8 % (ref 0.0–7.0)
Eosinophils Absolute: 0.1 10*3/uL (ref 0.0–0.5)
HCT: 45.5 % (ref 34.8–46.6)
HEMOGLOBIN: 15.6 g/dL (ref 11.6–15.9)
LYMPH#: 1.4 10*3/uL (ref 0.9–3.3)
LYMPH%: 17.1 % (ref 14.0–48.0)
MCH: 31.5 pg (ref 26.0–34.0)
MCHC: 34.3 g/dL (ref 32.0–36.0)
MCV: 92 fL (ref 81–101)
MONO#: 0.4 10*3/uL (ref 0.1–0.9)
MONO%: 5.2 % (ref 0.0–13.0)
NEUT#: 6.4 10*3/uL (ref 1.5–6.5)
NEUT%: 76.5 % (ref 39.6–80.0)
PLATELETS: 258 10*3/uL (ref 145–400)
RBC: 4.96 10*6/uL (ref 3.70–5.32)
RDW: 12.2 % (ref 11.1–15.7)
WBC: 8.3 10*3/uL (ref 3.9–10.0)

## 2014-07-31 LAB — TSH: TSH: 1.56 u[IU]/mL (ref 0.35–4.50)

## 2014-07-31 LAB — CHCC SATELLITE - SMEAR

## 2014-07-31 LAB — FERRITIN CHCC: Ferritin: 209 ng/ml (ref 9–269)

## 2014-07-31 MED ORDER — RANITIDINE HCL 300 MG PO TABS: 300.0000 mg | ORAL_TABLET | Freq: Every day | ORAL | Status: DC

## 2014-07-31 MED ORDER — VENLAFAXINE HCL 50 MG PO TABS: 50.0000 mg | ORAL_TABLET | Freq: Two times a day (BID) | ORAL | Status: DC

## 2014-07-31 MED ORDER — ESOMEPRAZOLE MAGNESIUM 40 MG PO CPDR: 40.0000 mg | DELAYED_RELEASE_CAPSULE | Freq: Every day | ORAL | Status: DC

## 2014-07-31 MED ORDER — SODIUM CHLORIDE 0.9 % IV SOLN
510.0000 mg | Freq: Once | INTRAVENOUS | Status: AC
  Administered 2014-07-31: 510 mg via INTRAVENOUS
  Filled 2014-07-31: qty 17

## 2014-07-31 MED ORDER — CARISOPRODOL 350 MG PO TABS: 350.0000 mg | ORAL_TABLET | Freq: Three times a day (TID) | ORAL | Status: DC | PRN

## 2014-07-31 MED ORDER — DIAZEPAM 10 MG PO TABS: 10.0000 mg | ORAL_TABLET | Freq: Two times a day (BID) | ORAL | Status: DC | PRN

## 2014-07-31 NOTE — Patient Instructions (Signed)
Gastroesophageal Reflux Disease, Adult Gastroesophageal reflux disease (GERD) happens when acid from your stomach flows up into the esophagus. When acid comes in contact with the esophagus, the acid causes soreness (inflammation) in the esophagus. Over time, GERD may create small holes (ulcers) in the lining of the esophagus. CAUSES   Increased body weight. This puts pressure on the stomach, making acid rise from the stomach into the esophagus.  Smoking. This increases acid production in the stomach.  Drinking alcohol. This causes decreased pressure in the lower esophageal sphincter (valve or ring of muscle between the esophagus and stomach), allowing acid from the stomach into the esophagus.  Late evening meals and a full stomach. This increases pressure and acid production in the stomach.  A malformed lower esophageal sphincter. Sometimes, no cause is found. SYMPTOMS   Burning pain in the lower part of the mid-chest behind the breastbone and in the mid-stomach area. This may occur twice a week or more often.  Trouble swallowing.  Sore throat.  Dry cough.  Asthma-like symptoms including chest tightness, shortness of breath, or wheezing. DIAGNOSIS  Your caregiver may be able to diagnose GERD based on your symptoms. In some cases, X-rays and other tests may be done to check for complications or to check the condition of your stomach and esophagus. TREATMENT  Your caregiver may recommend over-the-counter or prescription medicines to help decrease acid production. Ask your caregiver before starting or adding any new medicines.  HOME CARE INSTRUCTIONS   Change the factors that you can control. Ask your caregiver for guidance concerning weight loss, quitting smoking, and alcohol consumption.  Avoid foods and drinks that make your symptoms worse, such as:  Caffeine or alcoholic drinks.  Chocolate.  Peppermint or mint flavorings.  Garlic and onions.  Spicy foods.  Citrus fruits,  such as oranges, lemons, or limes.  Tomato-based foods such as sauce, chili, salsa, and pizza.  Fried and fatty foods.  Avoid lying down for the 3 hours prior to your bedtime or prior to taking a nap.  Eat small, frequent meals instead of large meals.  Wear loose-fitting clothing. Do not wear anything tight around your waist that causes pressure on your stomach.  Raise the head of your bed 6 to 8 inches with wood blocks to help you sleep. Extra pillows will not help.  Only take over-the-counter or prescription medicines for pain, discomfort, or fever as directed by your caregiver.  Do not take aspirin, ibuprofen, or other nonsteroidal anti-inflammatory drugs (NSAIDs). SEEK IMMEDIATE MEDICAL CARE IF:   You have pain in your arms, neck, jaw, teeth, or back.  Your pain increases or changes in intensity or duration.  You develop nausea, vomiting, or sweating (diaphoresis).  You develop shortness of breath, or you faint.  Your vomit is green, yellow, black, or looks like coffee grounds or blood.  Your stool is red, bloody, or black. These symptoms could be signs of other problems, such as heart disease, gastric bleeding, or esophageal bleeding. MAKE SURE YOU:   Understand these instructions.  Will watch your condition.  Will get help right away if you are not doing well or get worse. Document Released: 04/13/2005 Document Revised: 09/26/2011 Document Reviewed: 01/21/2011 ExitCare Patient Information 2015 [employer]. This information is not intended to replace advice given to you by your health care provider. Make sure you discuss any questions you have with your health care provider.  

## 2014-07-31 NOTE — Progress Notes (Signed)
Pre visit review using our clinic review tool, if applicable. No additional management support is needed unless otherwise documented below in the visit note. 

## 2014-07-31 NOTE — Telephone Encounter (Addendum)
Pt in ofc today w no insurance. I gave pt a MCD appl and [place] (CAFA) to complete.  Mcd application sent today to:  [facility] [address], [place], [place] [address] and CAFA application sent today interofc to: [facility] Nolene Bernheim: [phone]  Patient Accts   Pt has also applied for the [place] program for drug replacement for iron.     Provided by: [place] [address] TEL: [phone]   FAX: [phone]                Medications       Feraheme injection (ferumoxytol) - APPROVED

## 2014-07-31 NOTE — Patient Instructions (Signed)

## 2014-07-31 NOTE — Progress Notes (Signed)
[place]  [phone] [phone]  ID: [name] OB: 10/16/1970 MR#: [id number]#:[id number] Patient Care Team: Mosie Lukes, MD as PCP - General (Family Medicine) Kathlee Nations, MD (Psychiatry) Irene Shipper, MD (Gastroenterology) Nira Retort, MD (Hematology and Oncology) Coralie Keens, MD (General Surgery)  DIAGNOSIS: Intermittent iron deficiency anemia   INTERVAL HISTORY: [name] is here today. She is feeling bad. She is c/o SOB, fatigue, dizziness, sweating and she is flushed in her face. She contacts [place] whenever she feel iron deficient and comes in for [facility]. She feels that she needs an infusion today. She last had an infusion in March 2015. At that time her iron saturation was 33% and her ferritin was 85.  She denies fever, chills, n/v, cough, rash, headache, chest pain, palpitations, abdominal pain, constipation, diarrhea, blood in urine or stool. No bleeding or pain.  Her appetite is good and she is staying hydrated. Her weight is stable at 289 lbs.  CURRENT TREATMENT: IV iron as indicated  REVIEW OF SYSTEMS: All other 10 point review of systems is negative.   PAST MEDICAL HISTORY: Past Medical History  Diagnosis Date  . Fibromyalgia   . Obesity   . Melanoma     right hip and knee  . Chicken pox   . Allergy   . Migraine   . UTI (lower urinary tract infection)   . GERD (gastroesophageal reflux disease)   . Anxiety disorder   . Anemia     in the past  . Depression     in teh past- ok now  . Shortness of breath     when anemic  . Complication of anesthesia 1999    urinary retention  . Neck pain on right side 12/23/2012  . Left thyroid nodule 03/31/2013  . Pain 03/31/2013  . TIA (transient ischemic attack) 03/31/2013    PAST SURGICAL HISTORY: Past Surgical History  Procedure Laterality Date  . Back surgery    . Cholecystectomy    . Wisdom tooth extraction  1989  . Melanoma excision  2011    stage I and II   . Laparoscopic assisted vaginal hysterectomy N/A 09/13/2012    Procedure: LAPAROSCOPIC ASSISTED VAGINAL HYSTERECTOMY;  Surgeon: Lavonia Drafts, MD;  Location: [facility];  Service: Gynecology;  Laterality: N/A;  . Bilateral salpingectomy Bilateral 09/13/2012    Procedure: BILATERAL SALPINGECTOMY;  Surgeon: Lavonia Drafts, MD;  Location: [facility];  Service: Gynecology;  Laterality: Bilateral;  . Abdominal hysterectomy      FAMILY HISTORY Family History  Problem Relation Age of Onset  . Colon cancer Neg Hx   . Colitis Father     crohn's  . Heart disease Father     grandfather  . Heart attack Father 17  . Arthritis Father     s/p back surgery  . Diabetes      grandfather  . Irritable bowel syndrome    . Kidney disease      grandfather  . Arthritis Other   . Cancer Other     breast  . Hyperlipidemia Other   . Mental illness Other   . Other Mother     neurological autoimmune disease, chorea in trunk  . Emphysema Maternal Uncle   . Arthritis Maternal Grandmother   . Lupus Maternal Grandmother   . Dementia Maternal Grandmother   . CVA Maternal Grandmother   . Arthritis Paternal Grandmother   . Heart disease Paternal Grandmother     chf  .  Diabetes Paternal Grandfather   . Kidney disease Paternal Grandfather   . Hypertension Paternal Grandfather   . Hyperlipidemia Paternal Grandfather   . Cancer Maternal Grandfather     bone    GYNECOLOGIC HISTORY:  Patient's last menstrual period was 08/13/2012.   SOCIAL HISTORY: History   Social History  . Marital Status: Divorced    Spouse Name: N/A    Number of Children: N/A  . Years of Education: N/A   Occupational History  . unemployed    Social History Main Topics  . Smoking status: Never Smoker   . Smokeless tobacco: Never Used     Comment: never used tobacco  . Alcohol Use: No  . Drug Use: No  . Sexual Activity: Not Currently    Birth Control/ Protection: None   Other Topics Concern  . Not on file    Social History Narrative   Daily caffeine    ADVANCED DIRECTIVES:  <no information>  HEALTH MAINTENANCE: History  Substance Use Topics  . Smoking status: Never Smoker   . Smokeless tobacco: Never Used     Comment: never used tobacco  . Alcohol Use: No   Colonoscopy: PAP: Bone density: Lipid panel:  Allergies  Allergen Reactions  . Adhesive [Tape] Dermatitis    Band- Aid adhesive  . Aleve [Naproxen Sodium]     Makes hands, legs, and feet swell  . Carbamazepine     Hallucinations and dizziness    Current Outpatient Prescriptions  Medication Sig Dispense Refill  . carisoprodol (SOMA) 350 MG tablet Take 1 tablet (350 mg total) by mouth 3 (three) times daily as needed. for muscle spams 90 tablet 3  . diazepam (VALIUM) 10 MG tablet Take 1 tablet (10 mg total) by mouth every 12 (twelve) hours as needed for anxiety or sleep. 40 tablet 3  . esomeprazole (NEXIUM) 40 MG capsule Take 1 capsule (40 mg total) by mouth daily. 30 capsule 3  . ranitidine (ZANTAC) 300 MG tablet Take 1 tablet (300 mg total) by mouth at bedtime. 30 tablet 5  . venlafaxine (EFFEXOR) 50 MG tablet Take 1 tablet (50 mg total) by mouth 2 (two) times daily with a meal. 60 tablet 3   Current Facility-Administered Medications  Medication Dose Route Frequency Provider Last Rate Last Dose  . ferumoxytol (FERAHEME) 510 mg in sodium chloride 0.9 % 100 mL IVPB  510 mg Intravenous Once Eliezer Bottom, NP        OBJECTIVE: Filed Vitals:   07/31/14 1121  BP: 131/67  Pulse: 83  Temp: 98 F (36.7 C)  Resp: 14    Filed Weights   07/31/14 1121  Weight: 289 lb (131.09 kg)   ECOG FS:1 - Symptomatic but completely ambulatory Ocular: Sclerae unicteric, pupils equal, round and reactive to light Ear-nose-throat: Oropharynx clear, dentition fair Lymphatic: No cervical or supraclavicular adenopathy Lungs no rales or rhonchi, good excursion bilaterally Heart regular rate and rhythm, no murmur appreciated Abd  soft, nontender, positive bowel sounds MSK no focal spinal tenderness, no joint edema Neuro: non-focal, well-oriented, appropriate affect Breasts: Deferred  LAB RESULTS: CMP     Component Value Date/Time   NA 138 09/26/2013 1524   NA 138 08/24/2012 0917   K 4.2 09/26/2013 1524   K 4.4 08/24/2012 0917   CL 106 09/26/2013 1524   CL 102 08/24/2012 0917   CO2 23 09/26/2013 1524   CO2 26 08/24/2012 0917   GLUCOSE 84 09/26/2013 1524   GLUCOSE 89 08/24/2012 0917  BUN 12 09/26/2013 1524   BUN 13.5 08/24/2012 0917   CREATININE 0.71 09/26/2013 1524   CREATININE 0.70 08/28/2013 1130   CREATININE 0.8 08/24/2012 0917   CALCIUM 9.1 09/26/2013 1524   CALCIUM 9.3 08/24/2012 0917   PROT 6.6 09/26/2013 1524   ALBUMIN 3.9 09/26/2013 1524   AST 10 09/26/2013 1524   ALT 11 09/26/2013 1524   ALKPHOS 77 09/26/2013 1524   BILITOT 0.3 09/26/2013 1524   GFRNONAA >90 08/28/2013 1130   GFRAA >90 08/28/2013 1130   INo results found for: SPEP, UPEP Lab Results  Component Value Date   WBC 8.3 07/31/2014   NEUTROABS 6.4 07/31/2014   HGB 15.6 07/31/2014   HCT 45.5 07/31/2014   MCV 92 07/31/2014   PLT 258 07/31/2014   No results found for: LABCA2 No components found for: LABCA125 No results for input(s): INR in the last 168 hours.  STUDIES: No results found.  ASSESSMENT/PLAN: [name] is a very pleasant 44 year-old female with intermittent iron deficiency anemia. She last had iron in March.  Her Hgb is 15.6 and MCV is 92. We will see what her iron studies show.  We will go ahead and give her Fereheme 510 today. We will wait and see if she needs to come back for another dose in 5 days.  She has no insurance at this time and prefers to follow-up with [place] as needed. This is fine.  We are available to see her any time she might need [place].   Eliezer Bottom, NP 07/31/2014 12:15 PM

## 2014-08-01 ENCOUNTER — Encounter: Payer: Self-pay | Admitting: Nurse Practitioner

## 2014-08-04 LAB — RETICULOCYTES (CHCC)
ABS Retic: 69.2 K/uL (ref 19.0–186.0)
RBC.: 4.94 MIL/uL (ref 3.87–5.11)
Retic Ct Pct: 1.4 % (ref 0.4–2.3)

## 2014-08-04 LAB — HEMOGLOBINOPATHY EVALUATION
HGB A2 QUANT: 2.5 % (ref 2.2–3.2)
HGB F QUANT: 0 % (ref 0.0–2.0)
Hemoglobin Other: 0 %
Hgb A: 97.5 % (ref 96.8–97.8)
Hgb S Quant: 0 %

## 2014-08-06 ENCOUNTER — Telehealth: Payer: Self-pay | Admitting: Hematology & Oncology

## 2014-08-06 NOTE — Telephone Encounter (Signed)
Pt APPROVED for the [employer] Assist program for drug replacement for iron.  Auth: [id number] Dos: 07/31/2014   [facility]  [address] [place] [address] TEL: [phone]   FAX: [phone]             Medications       Feraheme injection (ferumoxytol) - APPROVED [id number]

## 2014-08-10 ENCOUNTER — Encounter: Payer: Self-pay | Admitting: Family Medicine

## 2014-08-10 DIAGNOSIS — R002 Palpitations: Secondary | ICD-10-CM

## 2014-08-10 DIAGNOSIS — F329 Major depressive disorder, single episode, unspecified: Secondary | ICD-10-CM | POA: Insufficient documentation

## 2014-08-10 DIAGNOSIS — F419 Anxiety disorder, unspecified: Secondary | ICD-10-CM

## 2014-08-10 DIAGNOSIS — F32A Depression, unspecified: Secondary | ICD-10-CM | POA: Insufficient documentation

## 2014-08-10 HISTORY — DX: Palpitations: R00.2

## 2014-08-10 NOTE — Assessment & Plan Note (Signed)
TSH normal. No symptoms today. Encouraged to minimize caffeine and report worsening symptoms

## 2014-08-10 NOTE — Progress Notes (Signed)
Patient ID: [name], female   DOB: Jul 09, 1971, 44 y.o.   MRN: [id number]   RASHMI TALLENT  [id number] [birth year] 08/10/2014      Progress Note-Follow Up  Subjective  Chief Complaint  Chief Complaint  Patient presents with  . MED CHECK    and refills and lab work    HPI  Patient is a 44 y.o. female in today for routine medical care. Patient in today for follow up. She is noting some recent increase in fatigue, sob. No recent acute illness or fevers. She is also noting palpitations, usually last seconds without associated symptoms. Denies CP/SOB/HA/congestion/fevers/GI or GU c/o. Taking meds as prescribed  Past Medical History  Diagnosis Date  . Fibromyalgia   . Obesity   . Melanoma     right hip and knee  . Chicken pox   . Allergy   . Migraine   . UTI (lower urinary tract infection)   . GERD (gastroesophageal reflux disease)   . Anxiety disorder   . Anemia     in the past  . Depression     in teh past- ok now  . Shortness of breath     when anemic  . Complication of anesthesia 1999    urinary retention  . Neck pain on right side 12/23/2012  . Left thyroid nodule 03/31/2013  . Pain 03/31/2013  . TIA (transient ischemic attack) 03/31/2013  . Palpitations 08/10/2014    Past Surgical History  Procedure Laterality Date  . Back surgery    . Cholecystectomy    . Wisdom tooth extraction  1989  . Melanoma excision  2011    stage I and II  . Laparoscopic assisted vaginal hysterectomy N/A 09/13/2012    Procedure: LAPAROSCOPIC ASSISTED VAGINAL HYSTERECTOMY;  Surgeon: Lavonia Drafts, MD;  Location: [facility];  Service: Gynecology;  Laterality: N/A;  . Bilateral salpingectomy Bilateral 09/13/2012    Procedure: BILATERAL SALPINGECTOMY;  Surgeon: Lavonia Drafts, MD;  Location: [facility];  Service: Gynecology;  Laterality: Bilateral;  . Abdominal hysterectomy      Family History  Problem Relation Age of Onset  . Colon cancer Neg Hx   . Colitis Father     crohn's    . Heart disease Father     grandfather  . Heart attack Father 36  . Arthritis Father     s/p back surgery  . Diabetes      grandfather  . Irritable bowel syndrome    . Kidney disease      grandfather  . Arthritis Other   . Cancer Other     breast  . Hyperlipidemia Other   . Mental illness Other   . Other Mother     neurological autoimmune disease, chorea in trunk  . Emphysema Maternal Uncle   . Arthritis Maternal Grandmother   . Lupus Maternal Grandmother   . Dementia Maternal Grandmother   . CVA Maternal Grandmother   . Arthritis Paternal Grandmother   . Heart disease Paternal Grandmother     chf  . Diabetes Paternal Grandfather   . Kidney disease Paternal Grandfather   . Hypertension Paternal Grandfather   . Hyperlipidemia Paternal Grandfather   . Cancer Maternal Grandfather     bone    History   Social History  . Marital Status: Divorced    Spouse Name: N/A    Number of Children: N/A  . Years of Education: N/A   Occupational History  . unemployed    Social History Main  Topics  . Smoking status: Never Smoker   . Smokeless tobacco: Never Used     Comment: never used tobacco  . Alcohol Use: No  . Drug Use: No  . Sexual Activity: Not Currently    Birth Control/ Protection: None   Other Topics Concern  . Not on file   Social History Narrative   Daily caffeine    No current outpatient prescriptions on file prior to visit.   No current facility-administered medications on file prior to visit.    Allergies  Allergen Reactions  . Adhesive [Tape] Dermatitis    Band- Aid adhesive  . Aleve [Naproxen Sodium]     Makes hands, legs, and feet swell  . Carbamazepine     Hallucinations and dizziness    Review of Systems  Review of Systems  Constitutional: Positive for malaise/fatigue. Negative for fever.  HENT: Negative for congestion.   Eyes: Negative for discharge.  Respiratory: Positive for shortness of breath.   Cardiovascular: Positive for  palpitations. Negative for chest pain and leg swelling.  Gastrointestinal: Negative for nausea, abdominal pain and diarrhea.  Genitourinary: Negative for dysuria.  Musculoskeletal: Negative for falls.  Skin: Negative for rash.  Neurological: Negative for loss of consciousness and headaches.  Endo/Heme/Allergies: Negative for polydipsia.  Psychiatric/Behavioral: Positive for depression. Negative for suicidal ideas. The patient is nervous/anxious. The patient does not have insomnia.     Objective  BP 94/58 mmHg  Pulse 82  Temp(Src) 98.1 F (36.7 C) (Oral)  Ht 5\' 8"  (1.727 m)  Wt 289 lb 12.8 oz (131.452 kg)  BMI 44.07 kg/m2  SpO2 98%  LMP 08/13/2012  Physical Exam  Physical Exam  Constitutional: She is oriented to person, place, and time and well-developed, well-nourished, and in no distress. No distress.  HENT:  Head: Normocephalic and atraumatic.  Eyes: Conjunctivae are normal.  Neck: Neck supple. No thyromegaly present.  Cardiovascular: Normal rate, regular rhythm and normal heart sounds.   No murmur heard. Pulmonary/Chest: Effort normal and breath sounds normal. She has no wheezes.  Abdominal: She exhibits no distension and no mass.  Musculoskeletal: She exhibits no edema.  Lymphadenopathy:    She has no cervical adenopathy.  Neurological: She is alert and oriented to person, place, and time.  Skin: Skin is warm and dry. No rash noted. She is not diaphoretic.  Psychiatric: Memory, affect and judgment normal.    Lab Results  Component Value Date   TSH 1.56 07/31/2014   Lab Results  Component Value Date   WBC 8.3 07/31/2014   HGB 15.6 07/31/2014   HCT 45.5 07/31/2014   MCV 92 07/31/2014   PLT 258 07/31/2014   Lab Results  Component Value Date   CREATININE 0.71 09/26/2013   BUN 12 09/26/2013   NA 138 09/26/2013   K 4.2 09/26/2013   CL 106 09/26/2013   CO2 23 09/26/2013   Lab Results  Component Value Date   ALT 11 09/26/2013   AST 10 09/26/2013    ALKPHOS 77 09/26/2013   BILITOT 0.3 09/26/2013   Lab Results  Component Value Date   CHOL 191 09/26/2013   Lab Results  Component Value Date   HDL 38* 09/26/2013   Lab Results  Component Value Date   LDLCALC 114* 09/26/2013   Lab Results  Component Value Date   TRIG 194* 09/26/2013   Lab Results  Component Value Date   CHOLHDL 5.0 09/26/2013     Assessment & Plan  GERD Avoid offending foods, start  probiotics. Do not eat large meals in late evening and consider raising head of bed.    Obesity Encouraged DASH diet, decrease po intake and increase exercise as tolerated. Needs 7-8 hours of sleep nightly. Avoid trans fats, eat small, frequent meals every 4-5 hours with lean proteins, complex carbs and healthy fats. Minimize simple carbs, GMO foods.   Iron deficiency anemia Follows with hematology, requires intermittent iron infusions. None recently although she is noting increased fatigue and SOB.   Palpitations TSH normal. No symptoms today. Encouraged to minimize caffeine and report worsening symptoms

## 2014-08-10 NOTE — Assessment & Plan Note (Signed)
Encouraged DASH diet, decrease po intake and increase exercise as tolerated. Needs 7-8 hours of sleep nightly. Avoid trans fats, eat small, frequent meals every 4-5 hours with lean proteins, complex carbs and healthy fats. Minimize simple carbs, GMO foods. 

## 2014-08-10 NOTE — Assessment & Plan Note (Signed)
Avoid offending foods, start probiotics. Do not eat large meals in late evening and consider raising head of bed.  

## 2014-08-10 NOTE — Assessment & Plan Note (Signed)
Follows with hematology, requires intermittent iron infusions. None recently although she is noting increased fatigue and SOB.

## 2014-08-15 ENCOUNTER — Telehealth: Payer: Self-pay | Admitting: Family Medicine

## 2014-08-15 NOTE — Telephone Encounter (Signed)
Please advise.//AB/CMA 

## 2014-08-15 NOTE — Telephone Encounter (Signed)
Steroids have risk. I would like her to be seen to consider having more prednisone

## 2014-08-15 NOTE — Telephone Encounter (Signed)
Caller name: Talecia, Sherlin Relation to pt: self  Call back number: [phone] Pharmacy: [id number] - [place], [address] AT [address] [phone] (Phone) [phone] (Fax)    Reason for call:  Pt is exp. Chronic back pain requesting predniSONE (DELTASONE) 10 MG tablet

## 2014-08-18 ENCOUNTER — Ambulatory Visit: Payer: Self-pay | Admitting: Medical

## 2014-08-19 ENCOUNTER — Ambulatory Visit (INDEPENDENT_AMBULATORY_CARE_PROVIDER_SITE_OTHER): Payer: Self-pay | Admitting: Medical

## 2014-08-19 ENCOUNTER — Encounter: Payer: Self-pay | Admitting: Medical

## 2014-08-19 VITALS — BP 127/86 | HR 102 | Temp 98.1°F | Ht 68.0 in | Wt 289.2 lb

## 2014-08-19 DIAGNOSIS — M5440 Lumbago with sciatica, unspecified side: Secondary | ICD-10-CM

## 2014-08-19 DIAGNOSIS — M549 Dorsalgia, unspecified: Secondary | ICD-10-CM | POA: Insufficient documentation

## 2014-08-19 DIAGNOSIS — N390 Urinary tract infection, site not specified: Secondary | ICD-10-CM

## 2014-08-19 DIAGNOSIS — R82998 Other abnormal findings in urine: Secondary | ICD-10-CM

## 2014-08-19 LAB — POCT URINALYSIS DIPSTICK
Blood, UA: NEGATIVE
Glucose, UA: NEGATIVE
KETONES UA: NEGATIVE
Nitrite, UA: NEGATIVE
PH UA: 5
Protein, UA: NEGATIVE
Spec Grav, UA: >=1.03
UROBILINOGEN UA: 1

## 2014-08-19 MED ORDER — KETOROLAC TROMETHAMINE 60 MG/2ML IM SOLN
60.0000 mg | Freq: Once | INTRAMUSCULAR | Status: AC
  Administered 2014-08-19: 60 mg via INTRAMUSCULAR

## 2014-08-19 MED ORDER — PREDNISONE 20 MG PO TABS: ORAL_TABLET | ORAL | Status: DC

## 2014-08-19 NOTE — Assessment & Plan Note (Signed)
For you low back pain, we gave you toradol today, and I want you to start prednisone tomorrow. Please stop any oral  nsaids while on the prednisone. You can use soma for muscle spasms.  Your pain should gradually get better if not notify us.  If at any point severe pain, leg weakness, foot drop or incontinence then ED evaluation.

## 2014-08-19 NOTE — Progress Notes (Signed)
Pre visit review using our clinic review tool, if applicable. No additional management support is needed unless otherwise documented below in the visit note. 

## 2014-08-19 NOTE — Patient Instructions (Addendum)
For you low back pain, we gave you toradol today, and I want you to start prednisone tomorrow. Please stop any oral  nsaids while on the prednisone. You can use soma for muscle spasms.  Your pain should gradually get better if not notify us.  If at any point severe pain, leg weakness, foot drop or incontinence then ED evaluation.  Follow up in 7-10 days or as needed.

## 2014-08-19 NOTE — Progress Notes (Signed)
Subjective:    Patient ID: [name], female    DOB: 24-Jan-1971, 44 y.o.   MRN: [id number]  HPI   Pt in with some lower back pain. Hx of disc ruptures in the past. Surgery in back  May 1999. Pt states hx of occasional back pain in the past. Will throw out her back 3-4 times a year. Since last Thursday woke up with the pain. Lower lack and lt si area. Some mild faint upper lateral thigh pain yesterday. But no pain in leg today. No bladder incontinence. No foot drop.  Pt sitting in certain positions pain 5/10. Pt took 2 of mom soma, 2 of mom hydrocodone, and 2 alleve. This am.   No pain urinating. No fever, no chills.   LMP- hx of hysterectomy.  Pt is not diabetic.    Review of Systems  Constitutional: Negative for fever, chills and fatigue.  Respiratory: Negative for cough, chest tightness, shortness of breath and wheezing.   Cardiovascular: Negative for chest pain and palpitations.  Gastrointestinal: Negative for abdominal pain.  Genitourinary: Negative.   Musculoskeletal: Positive for back pain.  Neurological: Negative for seizures, weakness and numbness.  Hematological: Negative for adenopathy. Does not bruise/bleed easily.       Objective:   Physical Exam  General Appearance- Not in acute distress.    Chest and Lung Exam Auscultation: Breath sounds:-Normal. Clear even and unlabored. Adventitious sounds:- No Adventitious sounds.  Cardiovascular Auscultation:Rythm - Regular, rate and rythm. Heart Sounds -Normal heart sounds.  Abdomen Inspection:-Inspection Normal.  Palpation/Perucssion: Palpation and Percussion of the abdomen reveal- Non Tender, No Rebound tenderness, No rigidity(Guarding) and No Palpable abdominal masses.  Liver:-Normal.  Spleen:- Normal.   Back Mid lumbar spine tenderness to palpation and lt si tenderness. Pain on straight leg lift. Worse left leg lift. Pain on lateral movements and flexion/extension of the spine. No cva pain.  Lower  ext neurologic  L5-S1 sensation intact bilaterally. Normal patellar reflexes bilaterally. No foot drop bilaterally.  Past Medical History  Diagnosis Date  . Fibromyalgia   . Obesity   . Melanoma     right hip and knee  . Chicken pox   . Allergy   . Migraine   . UTI (lower urinary tract infection)   . GERD (gastroesophageal reflux disease)   . Anxiety disorder   . Anemia     in the past  . Depression     in teh past- ok now  . Shortness of breath     when anemic  . Complication of anesthesia 1999    urinary retention  . Neck pain on right side 12/23/2012  . Left thyroid nodule 03/31/2013  . Pain 03/31/2013  . TIA (transient ischemic attack) 03/31/2013  . Palpitations 08/10/2014    History   Social History  . Marital Status: Divorced    Spouse Name: N/A    Number of Children: N/A  . Years of Education: N/A   Occupational History  . unemployed    Social History Main Topics  . Smoking status: Never Smoker   . Smokeless tobacco: Never Used     Comment: never used tobacco  . Alcohol Use: No  . Drug Use: No  . Sexual Activity: Not Currently    Birth Control/ Protection: None   Other Topics Concern  . Not on file   Social History Narrative   Daily caffeine    Past Surgical History  Procedure Laterality Date  . Back surgery    .  Cholecystectomy    . Wisdom tooth extraction  1989  . Melanoma excision  2011    stage I and II  . Laparoscopic assisted vaginal hysterectomy N/A 09/13/2012    Procedure: LAPAROSCOPIC ASSISTED VAGINAL HYSTERECTOMY;  Surgeon: Lavonia Drafts, MD;  Location: [facility];  Service: Gynecology;  Laterality: N/A;  . Bilateral salpingectomy Bilateral 09/13/2012    Procedure: BILATERAL SALPINGECTOMY;  Surgeon: Lavonia Drafts, MD;  Location: [facility];  Service: Gynecology;  Laterality: Bilateral;  . Abdominal hysterectomy      Family History  Problem Relation Age of Onset  . Colon cancer Neg Hx   . Colitis Father     crohn's  .  Heart disease Father     grandfather  . Heart attack Father 24  . Arthritis Father     s/p back surgery  . Diabetes      grandfather  . Irritable bowel syndrome    . Kidney disease      grandfather  . Arthritis Other   . Cancer Other     breast  . Hyperlipidemia Other   . Mental illness Other   . Other Mother     neurological autoimmune disease, chorea in trunk  . Emphysema Maternal Uncle   . Arthritis Maternal Grandmother   . Lupus Maternal Grandmother   . Dementia Maternal Grandmother   . CVA Maternal Grandmother   . Arthritis Paternal Grandmother   . Heart disease Paternal Grandmother     chf  . Diabetes Paternal Grandfather   . Kidney disease Paternal Grandfather   . Hypertension Paternal Grandfather   . Hyperlipidemia Paternal Grandfather   . Cancer Maternal Grandfather     bone    Allergies  Allergen Reactions  . Adhesive [Tape] Dermatitis    Band- Aid adhesive  . Aleve [Naproxen Sodium]     Makes hands, legs, and feet swell  . Carbamazepine     Hallucinations and dizziness    Current Outpatient Prescriptions on File Prior to Visit  Medication Sig Dispense Refill  . carisoprodol (SOMA) 350 MG tablet Take 1 tablet (350 mg total) by mouth 3 (three) times daily as needed. for muscle spams 90 tablet 3  . diazepam (VALIUM) 10 MG tablet Take 1 tablet (10 mg total) by mouth every 12 (twelve) hours as needed for anxiety or sleep. 40 tablet 3  . esomeprazole (NEXIUM) 40 MG capsule Take 1 capsule (40 mg total) by mouth daily. 30 capsule 3  . ranitidine (ZANTAC) 300 MG tablet Take 1 tablet (300 mg total) by mouth at bedtime. 30 tablet 5  . venlafaxine (EFFEXOR) 50 MG tablet Take 1 tablet (50 mg total) by mouth 2 (two) times daily with a meal. 60 tablet 3   No current facility-administered medications on file prior to visit.    BP 127/86 mmHg  Pulse 102  Temp(Src) 98.1 F (36.7 C) (Oral)  Ht 5\' 8"  (1.727 m)  Wt 289 lb 3.2 oz (131.18 kg)  BMI 43.98 kg/m2  SpO2  96%  LMP 08/13/2012         Assessment & Plan:

## 2014-08-19 NOTE — Telephone Encounter (Signed)
Called and spoke with the pt and she stated that she came in the office today and saw Percell Miller, and he prescribed the prednisone.//AB/CMA

## 2014-08-20 ENCOUNTER — Other Ambulatory Visit: Payer: Self-pay | Admitting: Family

## 2014-08-20 LAB — URINE CULTURE
Colony Count: NO GROWTH
Organism ID, Bacteria: NO GROWTH

## 2014-08-26 ENCOUNTER — Telehealth: Payer: Self-pay | Admitting: Family Medicine

## 2014-08-26 MED ORDER — HYDROCODONE-ACETAMINOPHEN 5-325 MG PO TABS: 1.0000 | ORAL_TABLET | Freq: Four times a day (QID) | ORAL | Status: DC | PRN

## 2014-08-26 NOTE — Telephone Encounter (Signed)
Caller name:Lewis, Destinie Thornsberry Relation to pt: self  Call back number: [phone] Pharmacy: [facility], [address] [phone] (Phone) [phone] (Fax)     Reason for call:  Pt states lower back pain and hip pain has worsen and prednisone is not working. In need of stronger RX. Please advise

## 2014-08-26 NOTE — Telephone Encounter (Signed)
Pt called stating she is worse. Needs stronger meds. Will offer her appointment. But if decllines will give limitied # of norco and advise see me or pcp next week if symptoms persist.

## 2014-08-26 NOTE — Telephone Encounter (Signed)
Notified patient she can pick up RX at front desk.

## 2014-08-28 ENCOUNTER — Telehealth: Payer: Self-pay | Admitting: Family Medicine

## 2014-08-28 DIAGNOSIS — M5441 Lumbago with sciatica, right side: Secondary | ICD-10-CM

## 2014-08-28 NOTE — Telephone Encounter (Signed)
Pt has severe pain. Told referral staff she could not get out of bed. Saw this note at end of the day/at night. I will get lpn to call pt and advise if severe pain then ED evaluation. Particularly if leg weakness, numbness, foot drop or incontinence. Pt wants mri but if she does have severe pain or there are  symptoms as listed then ED evaluation is recommended as MRI can be done state as oppossed to trying to get mri outpatient going through preapproval process and delaying diagnosis/treatment.   I could try to refer to neurosurgeon by early Monday. But ED is best route at this point if pain is such that she can't get out of bed.

## 2014-08-28 NOTE — Telephone Encounter (Signed)
SHE IS IN PAIN AND CANT GET UP OUT OF BED TODAY.  SHE WANTS A REFERRAL TO MRI AND NEUROLOGY

## 2014-08-29 ENCOUNTER — Telehealth: Payer: Self-pay | Admitting: Medical

## 2014-08-29 ENCOUNTER — Other Ambulatory Visit: Payer: Self-pay | Admitting: Family Medicine

## 2014-08-29 DIAGNOSIS — M5440 Lumbago with sciatica, unspecified side: Secondary | ICD-10-CM

## 2014-08-29 NOTE — Telephone Encounter (Signed)
Note to staff to try to expedite referral to neurosurgeon

## 2014-08-30 ENCOUNTER — Ambulatory Visit (HOSPITAL_BASED_OUTPATIENT_CLINIC_OR_DEPARTMENT_OTHER)
Admission: RE | Admit: 2014-08-30 | Discharge: 2014-08-30 | Disposition: A | Discharge status: Home / Self Care | Payer: Self-pay | Source: Ambulatory Visit | Attending: Family Medicine | Admitting: Family Medicine

## 2014-08-30 DIAGNOSIS — M25552 Pain in left hip: Secondary | ICD-10-CM | POA: Insufficient documentation

## 2014-08-30 DIAGNOSIS — M5126 Other intervertebral disc displacement, lumbar region: Secondary | ICD-10-CM | POA: Insufficient documentation

## 2014-08-30 DIAGNOSIS — M79605 Pain in left leg: Secondary | ICD-10-CM | POA: Insufficient documentation

## 2014-08-30 DIAGNOSIS — M2578 Osteophyte, vertebrae: Secondary | ICD-10-CM | POA: Insufficient documentation

## 2014-08-30 DIAGNOSIS — M5440 Lumbago with sciatica, unspecified side: Secondary | ICD-10-CM

## 2014-08-30 DIAGNOSIS — M4807 Spinal stenosis, lumbosacral region: Secondary | ICD-10-CM | POA: Insufficient documentation

## 2014-09-01 ENCOUNTER — Telehealth: Payer: Self-pay | Admitting: Family Medicine

## 2014-09-01 NOTE — Telephone Encounter (Signed)
Spoke with referral co ordinator on Friday at [facility] and also got Dr Charlett Blake to put in an order for MRI to be done here at the [facility] on Saturday.

## 2014-09-04 ENCOUNTER — Ambulatory Visit: Payer: Self-pay | Admitting: Medical

## 2014-09-05 ENCOUNTER — Encounter: Payer: Self-pay | Admitting: Physician Assistant

## 2014-09-05 ENCOUNTER — Ambulatory Visit (INDEPENDENT_AMBULATORY_CARE_PROVIDER_SITE_OTHER): Payer: Self-pay | Admitting: Physician Assistant

## 2014-09-05 VITALS — BP 138/98 | HR 91 | Temp 98.3°F | Resp 16 | Ht 68.0 in | Wt 287.1 lb

## 2014-09-05 DIAGNOSIS — J029 Acute pharyngitis, unspecified: Secondary | ICD-10-CM

## 2014-09-05 DIAGNOSIS — B370 Candidal stomatitis: Secondary | ICD-10-CM

## 2014-09-05 LAB — POCT RAPID STREP A (OFFICE): Rapid Strep A Screen: NEGATIVE

## 2014-09-05 MED ORDER — NYSTATIN 100000 UNIT/ML MT SUSP: 5.0000 mL | Freq: Four times a day (QID) | OROMUCOSAL | Status: DC

## 2014-09-05 MED ORDER — FLUCONAZOLE 150 MG PO TABS: 150.0000 mg | ORAL_TABLET | Freq: Every day | ORAL | Status: DC

## 2014-09-05 NOTE — Progress Notes (Signed)
Patient presents to clinic today c/o sore throat x 1 week associated with red and white patches of her throat after using a steroid for lumbar back pain.  Patient endorses intermittent fevers.  Denies cough or congestion.  Went to an UC on Sunday and was given Z-pack for a URI.  States her strep test was negative but was not called with culture results.  States throat was initially swollen and causing difficulty with swallowing but that has resolved.  Denies SOB.  Symptoms did not improve with antibiotic therapy.  Past Medical History  Diagnosis Date  . Fibromyalgia   . Obesity   . Melanoma     right hip and knee  . Chicken pox   . Allergy   . Migraine   . UTI (lower urinary tract infection)   . GERD (gastroesophageal reflux disease)   . Anxiety disorder   . Anemia     in the past  . Depression     in teh past- ok now  . Shortness of breath     when anemic  . Complication of anesthesia 1999    urinary retention  . Neck pain on right side 12/23/2012  . Left thyroid nodule 03/31/2013  . Pain 03/31/2013  . TIA (transient ischemic attack) 03/31/2013  . Palpitations 08/10/2014    Current Outpatient Prescriptions on File Prior to Visit  Medication Sig Dispense Refill  . carisoprodol (SOMA) 350 MG tablet Take 1 tablet (350 mg total) by mouth 3 (three) times daily as needed. for muscle spams 90 tablet 3  . diazepam (VALIUM) 10 MG tablet Take 1 tablet (10 mg total) by mouth every 12 (twelve) hours as needed for anxiety or sleep. 40 tablet 3  . esomeprazole (NEXIUM) 40 MG capsule Take 1 capsule (40 mg total) by mouth daily. 30 capsule 3  . ranitidine (ZANTAC) 300 MG tablet Take 1 tablet (300 mg total) by mouth at bedtime. (Patient not taking: Reported on 09/05/2014) 30 tablet 5  . venlafaxine (EFFEXOR) 50 MG tablet Take 1 tablet (50 mg total) by mouth 2 (two) times daily with a meal. (Patient not taking: Reported on 09/05/2014) 60 tablet 3   No current facility-administered medications on file  prior to visit.    Allergies  Allergen Reactions  . Adhesive [Tape] Dermatitis    Band- Aid adhesive  . Aleve [Naproxen Sodium]     Makes hands, legs, and feet swell  . Carbamazepine     Hallucinations and dizziness    Family History  Problem Relation Age of Onset  . Colon cancer Neg Hx   . Colitis Father     crohn's  . Heart disease Father     grandfather  . Heart attack Father 54  . Arthritis Father     s/p back surgery  . Diabetes      grandfather  . Irritable bowel syndrome    . Kidney disease      grandfather  . Arthritis Other   . Cancer Other     breast  . Hyperlipidemia Other   . Mental illness Other   . Other Mother     neurological autoimmune disease, chorea in trunk  . Emphysema Maternal Uncle   . Arthritis Maternal Grandmother   . Lupus Maternal Grandmother   . Dementia Maternal Grandmother   . CVA Maternal Grandmother   . Arthritis Paternal Grandmother   . Heart disease Paternal Grandmother     chf  . Diabetes Paternal Grandfather   .  Kidney disease Paternal Grandfather   . Hypertension Paternal Grandfather   . Hyperlipidemia Paternal Grandfather   . Cancer Maternal Grandfather     bone    History   Social History  . Marital Status: Divorced    Spouse Name: N/A  . Number of Children: N/A  . Years of Education: N/A   Occupational History  . unemployed    Social History Main Topics  . Smoking status: Never Smoker   . Smokeless tobacco: Never Used     Comment: never used tobacco  . Alcohol Use: No  . Drug Use: No  . Sexual Activity: Not Currently    Birth Control/ Protection: None   Other Topics Concern  . Not on file   Social History Narrative   Daily caffeine   Review of Systems - See HPI.  All other ROS are negative.  BP 138/98 mmHg  Pulse 91  Temp(Src) 98.3 F (36.8 C) (Oral)  Resp 16  Ht 5\' 8"  (1.727 m)  Wt 287 lb 2 oz (130.239 kg)  BMI 43.67 kg/m2  SpO2 100%  LMP 08/13/2012  Physical Exam  Constitutional: She  is oriented to person, place, and time and well-developed, well-nourished, and in no distress.  HENT:  Head: Normocephalic and atraumatic.  Right Ear: Tympanic membrane, external ear and ear canal normal.  Left Ear: Tympanic membrane, external ear and ear canal normal.  Nose: Nose normal.  Oral thrush noted of soft palate and buccal mucosa.  Some mild posterior oropharyngeal erythema noted.  Cardiovascular: Normal rate, regular rhythm, normal heart sounds and intact distal pulses.   Pulmonary/Chest: Effort normal and breath sounds normal. No respiratory distress. She has no wheezes. She has no rales. She exhibits no tenderness.  Neurological: She is alert and oriented to person, place, and time.  Vitals reviewed.   Recent Results (from the past 2160 hour(s))  CBC with Differential [facility])     Status: None   Collection Time: 07/31/14 10:55 AM  Result Value Ref Range   WBC 8.3 3.9 - 10.0 10e3/uL   RBC 4.96 3.70 - 5.32 10e6/uL   HGB 15.6 11.6 - 15.9 g/dL   HCT 45.5 34.8 - 46.6 %   MCV 92 81 - 101 fL   MCH 31.5 26.0 - 34.0 pg   MCHC 34.3 32.0 - 36.0 g/dL   RDW 12.2 11.1 - 15.7 %   Platelets 258 145 - 400 10e3/uL   NEUT# 6.4 1.5 - 6.5 10e3/uL   LYMPH# 1.4 0.9 - 3.3 10e3/uL   MONO# 0.4 0.1 - 0.9 10e3/uL   Eosinophils Absolute 0.1 0.0 - 0.5 10e3/uL   BASO# 0.0 0.0 - 0.2 10e3/uL   NEUT% 76.5 39.6 - 80.0 %   LYMPH% 17.1 14.0 - 48.0 %   MONO% 5.2 0.0 - 13.0 %   EOS% 0.8 0.0 - 7.0 %   BASO% 0.4 0.0 - 2.0 %  Reticulocyte Count     Status: None   Collection Time: 07/31/14 10:55 AM  Result Value Ref Range   Retic Ct Pct 1.4 0.4 - 2.3 %   RBC. 4.94 3.87 - 5.11 MIL/uL   ABS Retic 69.2 19.0 - 186.0 K/uL  [facility] - Smear     Status: None   Collection Time: 07/31/14 10:55 AM  Result Value Ref Range   Smear Result Smear Available   Ferritin     Status: None   Collection Time: 07/31/14 10:55 AM  Result Value Ref Range   Ferritin  209 9 - 269 ng/ml  Iron and TIBC      Status: None   Collection Time: 07/31/14 10:55 AM  Result Value Ref Range   Iron 94 41 - 142 ug/dL   TIBC 265 236 - 444 ug/dL   UIBC 171 120 - 384 ug/dL   %SAT 35 21 - 57 %  Hemoglobinopathy evaluation     Status: None   Collection Time: 07/31/14 10:55 AM  Result Value Ref Range   Hgb A 97.5 96.8 - 97.8 %   Hgb A2 Quant 2.5 2.2 - 3.2 %    Comment:  Patients with the combination of iron deficiency and B-thalassemia mayclinically present with normal A2 level.  An elevated A2 level can notbe used to screen for beta-thalassemia in these cases.    Hgb F Quant 0.0 0.0 - 2.0 %   Hgb S Quant 0.0 0.0 %   Hemoglobin Other 0.0 0.0 %    Comment:  Interpretation--------------Normal study. Reviewed by Odis Hollingshead, MD, PhD, FCAP (Electronic Signature onFile)  TSH     Status: None   Collection Time: 07/31/14 12:40 PM  Result Value Ref Range   TSH 1.56 0.35 - 4.50 uIU/mL  POCT Urinalysis Dipstick     Status: Abnormal   Collection Time: 08/19/14 10:22 AM  Result Value Ref Range   Color, UA Golden    Clarity, UA Clear    Glucose, UA Neg    Bilirubin, UA Large    Ketones, UA Neg    Spec Grav, UA >=1.030    Blood, UA Neg    pH, UA 5.0    Protein, UA Neg    Urobilinogen, UA 1.0    Nitrite, UA Neg    Leukocytes, UA Trace   Urine Culture     Status: None   Collection Time: 08/19/14 11:57 AM  Result Value Ref Range   Colony Count NO GROWTH    Organism ID, Bacteria NO GROWTH     Assessment/Plan: Oral thrush Rapid strep negative.  Will send for culture. Rx Diflucan. Rx Nystatin mouthwash.  Supportive measures discussed.  Follow-up PRN.

## 2014-09-05 NOTE — Progress Notes (Signed)
Pre visit review using our clinic review tool, if applicable. No additional management support is needed unless otherwise documented below in the visit note/SLS  

## 2014-09-05 NOTE — Addendum Note (Signed)
Addended by: Rockwell Germany on: 09/05/2014 09:40 AM   Modules accepted: Orders

## 2014-09-05 NOTE — Patient Instructions (Signed)
  Please take medications as directed.  Use the mouthwash three-four times daily until lesions have been gone for 2 days.  Stay well hydrated.  Over-the-counter pain medication for pain, which should resolve quickly since we are treating the source of pain.   Thrush, Adult  Ritta Slot is an infection that can happen on the mouth, throat, tongue, or other areas. It causes white patches to form on the mouth and tongue. HOME CARE  Only take medicine as told by your doctor. You may be given medicine to swallow or to apply right on the area.  Eat plain yogurt that contains live cultures (check the label).  Rinse your mouth many times a day with a warm saltwater rinse. To make the rinse, mix 1 teaspoon (6 g) of salt in 8 ounces (0.2 L) of warm water. To reduce pain:  Drink cold liquids such as water or iced tea.  Eat frozen ice pops or frozen juices.  Eat foods that are easy to swallow, such as gelatin or ice cream.  Drink from a straw if the patches are painful. If you are breastfeeding:  Clean your nipples with an antifungal medicine.  Dry your nipples after breastfeeding.  Use an ointment called lanolin to help relieve nipple soreness. If you wear dentures:  Take out your dentures before going to bed.  Brush them thoroughly.  Soak them in a denture cleaner. GET HELP IF:   Your problems are getting worse.  Your problems are not improving within 7 days of starting treatment.  Your infection is spreading. This may show as white patches on the skin outside of your mouth.  You are nursing and have redness and pain in the nipples. MAKE SURE YOU:  Understand these instructions.  Will watch your condition.  Will get help right away if you are not doing well or get worse. Document Released: 09/28/2009 Document Revised: 04/24/2013 Document Reviewed: 02/04/2013 [facility] Patient Information 2015 [place], [place]. This information is not intended to replace advice given to you by your  health care provider. Make sure you discuss any questions you have with your health care provider.

## 2014-09-05 NOTE — Assessment & Plan Note (Signed)
Rapid strep negative.  Will send for culture. Rx Diflucan. Rx Nystatin mouthwash.  Supportive measures discussed.  Follow-up PRN.

## 2014-09-07 LAB — CULTURE, GROUP A STREP: Organism ID, Bacteria: NORMAL

## 2014-09-08 ENCOUNTER — Telehealth: Payer: Self-pay | Admitting: Physician Assistant

## 2014-09-08 DIAGNOSIS — B370 Candidal stomatitis: Secondary | ICD-10-CM

## 2014-09-08 MED ORDER — NYSTATIN 100000 UNIT/ML MT SUSP: 5.0000 mL | Freq: Four times a day (QID) | OROMUCOSAL | Status: DC

## 2014-09-08 NOTE — Telephone Encounter (Signed)
-----   Message from Rockwell Germany, [place] sent at 09/08/2014  3:06 PM EST ----- Regarding: results Patient informed, understood & agreed/SLS Pt request refill on Nystatin suspension/sls   ----- Message -----    From: Brunetta Jeans, PA-C    Sent: 09/07/2014   6:21 PM      To: Rockwell Germany, CMA  Strep Culture negative.  How is she feeling after taking the Diflucan and Nystatin suspension?

## 2014-09-08 NOTE — Telephone Encounter (Signed)
Refill sent.

## 2015-01-12 ENCOUNTER — Other Ambulatory Visit: Payer: Self-pay

## 2015-01-23 ENCOUNTER — Encounter: Payer: Self-pay | Admitting: Internal Medicine

## 2015-02-24 ENCOUNTER — Ambulatory Visit: Payer: Self-pay | Admitting: Physician Assistant

## 2015-03-17 ENCOUNTER — Other Ambulatory Visit: Payer: Self-pay | Admitting: Family Medicine

## 2015-03-17 DIAGNOSIS — Z1231 Encounter for screening mammogram for malignant neoplasm of breast: Secondary | ICD-10-CM

## 2015-03-24 ENCOUNTER — Ambulatory Visit (HOSPITAL_COMMUNITY)
Admission: RE | Admit: 2015-03-24 | Discharge: 2015-03-24 | Disposition: A | Discharge status: Home / Self Care | Payer: Self-pay | Source: Ambulatory Visit | Attending: Family Medicine | Admitting: Family Medicine

## 2015-03-24 DIAGNOSIS — Z1231 Encounter for screening mammogram for malignant neoplasm of breast: Secondary | ICD-10-CM

## 2015-08-04 ENCOUNTER — Telehealth: Payer: Self-pay | Admitting: *Deleted

## 2015-08-04 NOTE — Telephone Encounter (Signed)
Received fax from [facility] for Medical Records; forwarded to [place] for scan/Email/SLS 01/17

## 2015-08-05 ENCOUNTER — Telehealth: Payer: Self-pay | Admitting: Family Medicine

## 2015-08-05 ENCOUNTER — Telehealth: Payer: Self-pay | Admitting: Internal Medicine

## 2015-08-05 NOTE — Telephone Encounter (Signed)
Pt scheduled my chart appt for 1/20 with Dr. Charlett Blake, per Durward Fortes called pt and left msg for her to return call and need to triage her to [facility].

## 2015-08-05 NOTE — Telephone Encounter (Signed)
Patient Name: [name]  DOB: 1971/03/07    Initial Comment Caller States severe acid reflux, blood in vomit   Nurse Assessment  Nurse: Mallie Mussel, RN, Alveta Heimlich Date/Time (Eastern Time): 08/05/2015 11:00:34 AM  Confirm and document reason for call. If symptomatic, describe symptoms. You must click the next button to save text entered. ---Caller states that she has vomited x 2 since midnight. There was traces of blood in her vomitus. She denies feeling more weak, dizzy and light headed. She does have anemia and frequently has to get Fe infusions. Denies fever and diarrhea.  Has the patient traveled out of the country within the last 30 days? ---No  Does the patient have any new or worsening symptoms? ---Yes  Will a triage be completed? ---Yes  Related visit to physician within the last 2 weeks? ---No  Does the PT have any chronic conditions? (i.e. diabetes, asthma, etc.) ---Yes  List chronic conditions. ---Anemia, Reflux  Did the patient indicate they were pregnant? ---No  Is this a behavioral health or substance abuse call? ---No     Guidelines    Guideline Title Affirmed Question Affirmed Notes  Vomiting Blood [1] Few streaks of blood in vomit AND [2] occurred one time    Final Disposition User   See PCP When Office is Open (within 3 days) Mallie Mussel, RN, Alveta Heimlich    Comments  Caller states that she already an appointment scheduled for Friday at 8:00am with Dr. Charlett Blake.   Referrals  REFERRED TO PCP OFFICE   Disagree/Comply: Comply

## 2015-08-05 NOTE — Telephone Encounter (Signed)
Pt states she has been having lots of issues with increased reflux and vomiting. States last night she vomited a lot and thinks she saw a little pink in the toilet, thinks her throat was just very irritated and raw. Pt scheduled to see Alonza Bogus PA tomorrow at [facility]. Pt aware of appt.

## 2015-08-05 NOTE — Telephone Encounter (Signed)
Pt returned call, transferred to [facility]. She states she's been vomitting from acid reflux and has a tinge of blood.

## 2015-08-06 ENCOUNTER — Ambulatory Visit (INDEPENDENT_AMBULATORY_CARE_PROVIDER_SITE_OTHER): Payer: BLUE CROSS/BLUE SHIELD | Admitting: Gastroenterology

## 2015-08-06 ENCOUNTER — Encounter: Payer: Self-pay | Admitting: Gastroenterology

## 2015-08-06 VITALS — BP 118/84 | HR 68 | Ht 68.0 in | Wt 294.0 lb

## 2015-08-06 DIAGNOSIS — R1310 Dysphagia, unspecified: Secondary | ICD-10-CM | POA: Diagnosis not present

## 2015-08-06 DIAGNOSIS — G43A0 Cyclical vomiting, not intractable: Secondary | ICD-10-CM | POA: Diagnosis not present

## 2015-08-06 DIAGNOSIS — K222 Esophageal obstruction: Secondary | ICD-10-CM

## 2015-08-06 DIAGNOSIS — K219 Gastro-esophageal reflux disease without esophagitis: Secondary | ICD-10-CM

## 2015-08-06 MED ORDER — PANTOPRAZOLE SODIUM 40 MG PO TBEC: 40.0000 mg | DELAYED_RELEASE_TABLET | Freq: Two times a day (BID) | ORAL | Status: DC

## 2015-08-06 MED FILL — PANTOPRAZOLE SOD DR 40 MG T: 40 | 30 days supply | Qty: 60 | Fill #0

## 2015-08-06 NOTE — Progress Notes (Signed)
08/06/2015 [name] [id number] [birth year]   HISTORY OF PRESENT ILLNESS:  This is a 45 year old female previously known to Dr. Henrene Pastor for treatment of GERD.  Last seen in 2013.  She had an EGD in February 2011 at which time she was found have erosive esophagitis with stricture in the distal esophagus and hiatal hernia. At that time she was on PPI in the form of Dexilant 60 mg daily.  Duodenal biopsies showed benign small bowel mucosa. She also had a colonoscopy at that time that was normal.  She presents to our office today with complaints primarily of recurrent vomiting. She describes this as very violent vomiting occurring mostly at nighttime. She also reports traces of what appears to her to be blood in her emesis as well.  She also describes right upper quadrant abdominal pain that is intermittent, but not necessarily related to her vomiting episodes. She is status post cholecystectomy. She says that she has not had insurance so she has recently been taking her mom's prescription Protonix. She now has insurance so she would like her own prescription.  Has some questionable dysphagia; says that sometimes stuff gets hung up, but she cannot elaborate much on this.  She has diagnoses of fibromyalgia and anxiety and is very dramatic at her appointment today about her symptoms and is crying.  Asks me "just how much can my body take before it turns to something bad", when referring to the vomiting.   Past Medical History  Diagnosis Date  . Fibromyalgia   . Obesity   . Melanoma ([place])     right hip and knee  . Chicken pox   . Allergy   . Migraine   . UTI (lower urinary tract infection)   . GERD (gastroesophageal reflux disease)   . Anxiety disorder   . Anemia     in the past  . Depression     in teh past- ok now  . Shortness of breath     when anemic  . Complication of anesthesia 1999    urinary retention  . Neck pain on right side 12/23/2012  . Left thyroid nodule 03/31/2013    . Pain 03/31/2013  . TIA (transient ischemic attack) 03/31/2013  . Palpitations 08/10/2014  . Hyperlipidemia 08/07/2015  . Sinusitis 08/07/2015  . Anxiety    Past Surgical History  Procedure Laterality Date  . Back surgery    . Cholecystectomy    . Wisdom tooth extraction  1989  . Melanoma excision  2011    stage I and II  . Laparoscopic assisted vaginal hysterectomy N/A 09/13/2012    Procedure: LAPAROSCOPIC ASSISTED VAGINAL HYSTERECTOMY;  Surgeon: Lavonia Drafts, MD;  Location: [facility];  Service: Gynecology;  Laterality: N/A;  . Bilateral salpingectomy Bilateral 09/13/2012    Procedure: BILATERAL SALPINGECTOMY;  Surgeon: Lavonia Drafts, MD;  Location: [facility];  Service: Gynecology;  Laterality: Bilateral;  . Abdominal hysterectomy    . Colonoscopy      reports that she has never smoked. She has never used smokeless tobacco. She reports that she does not drink alcohol or use illicit drugs. family history includes Arthritis in her father, maternal grandmother, other, and paternal grandmother; CVA in her maternal grandmother; Cancer in her maternal grandfather and other; Colitis in her father; Dementia in her maternal grandmother; Diabetes in her paternal grandfather; Emphysema in her maternal uncle; Heart attack (age of onset: 26) in her father; Heart disease in her father and paternal grandmother; Hyperlipidemia  in her other and paternal grandfather; Hypertension in her paternal grandfather; Kidney disease in her paternal grandfather; Lupus in her maternal grandmother; Mental illness in her other; Other in her mother. There is no history of Colon cancer. Allergies  Allergen Reactions  . Adhesive [Tape] Dermatitis    Band- Aid adhesive  . Aleve [Naproxen Sodium]     Makes hands, legs, and feet swell  . Carbamazepine     Hallucinations and dizziness      Outpatient Encounter Prescriptions as of 08/06/2015  Medication Sig  . diazepam (VALIUM) 10 MG tablet Take 1 tablet (10 mg  total) by mouth every 12 (twelve) hours as needed for anxiety or sleep.  . [DISCONTINUED] pantoprazole (PROTONIX) 40 MG tablet Take 40 mg by mouth daily. Take 1 tab every morning.  . pantoprazole (PROTONIX) 40 MG tablet Take 1 tablet (40 mg total) by mouth 2 (two) times daily.  . [DISCONTINUED] carisoprodol (SOMA) 350 MG tablet Take 1 tablet (350 mg total) by mouth 3 (three) times daily as needed. for muscle spams (Patient not taking: Reported on 08/06/2015)  . [DISCONTINUED] esomeprazole (NEXIUM) 40 MG capsule Take 1 capsule (40 mg total) by mouth daily. (Patient not taking: Reported on 08/06/2015)  . [DISCONTINUED] fluconazole (DIFLUCAN) 150 MG tablet Take 1 tablet (150 mg total) by mouth daily. (Patient not taking: Reported on 08/06/2015)  . [DISCONTINUED] nystatin (MYCOSTATIN) 100000 UNIT/ML suspension Take 5 mLs (500,000 Units total) by mouth 4 (four) times daily. (Patient not taking: Reported on 08/06/2015)  . [DISCONTINUED] ranitidine (ZANTAC) 300 MG tablet Take 1 tablet (300 mg total) by mouth at bedtime. (Patient not taking: Reported on 09/05/2014)  . [DISCONTINUED] venlafaxine (EFFEXOR) 50 MG tablet Take 1 tablet (50 mg total) by mouth 2 (two) times daily with a meal. (Patient not taking: Reported on 09/05/2014)   No facility-administered encounter medications on file as of 08/06/2015.     REVIEW OF SYSTEMS  : All other systems reviewed and negative except where noted in the History of Present Illness.   PHYSICAL EXAM: BP 118/84 mmHg  Pulse 68  Ht 5\' 8"  (1.727 m)  Wt 294 lb (133.358 kg)  BMI 44.71 kg/m2  LMP 08/13/2012 General: Well developed white female in no acute distress; crying Head: Normocephalic and atraumatic Eyes:  Sclerae anicteric, conjunctiva pink. Ears: Normal auditory acuity Lungs: Clear throughout to auscultation Heart: Regular rate and rhythm Abdomen: Soft, non-distended.  Normal bowel sounds.  Non-tender. Musculoskeletal: Symmetrical with no gross deformities    Skin: No lesions on visible extremities Extremities: No edema  Neurological: Alert oriented x 4, grossly non-focal Psychological:  Alert and cooperative. 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  She had not been on PPI therapy for quite some time due to insurance issues. Now presenting with recurrent violent vomiting episodes at nighttime, which could be reflux related. Reported appears of blood traces in emesis.  ? Esophagitis vs ulcer disease vs MWT from retching.  Has some questionable recurrent dysphagia. Right upper quadrant abdominal pain is not necessarily associated with vomiting.  *We will schedule her for an EGD with possible dilation with Dr. Henrene Pastor.  The risks, benefits, and alternatives to EGD with dilation were discussed with the patient and she consents to proceed.  *Will place her on pantoprazole 40 mg BID for now. *Discussed GERD dietary measures.   CC:  Mosie Lukes, MD

## 2015-08-06 NOTE — Patient Instructions (Signed)
You have been scheduled for an endoscopy. Please follow written instructions given to you at your visit today. If you use inhalers (even only as needed), please bring them with you on the day of your procedure. Your physician has requested that you go to www.startemmi.com and enter the access code given to you at your visit today. This web site gives a general overview about your procedure. However, you should still follow specific instructions given to you by our office regarding your preparation for the procedure.  We have sent the following medications to your pharmacy for you to pick up at your convenience: Pantoprazole 40 mg twice a day 

## 2015-08-07 ENCOUNTER — Encounter: Payer: Self-pay | Admitting: Family Medicine

## 2015-08-07 ENCOUNTER — Ambulatory Visit (INDEPENDENT_AMBULATORY_CARE_PROVIDER_SITE_OTHER): Payer: BLUE CROSS/BLUE SHIELD | Admitting: Family Medicine

## 2015-08-07 ENCOUNTER — Other Ambulatory Visit: Payer: Self-pay | Admitting: *Deleted

## 2015-08-07 ENCOUNTER — Ambulatory Visit (HOSPITAL_BASED_OUTPATIENT_CLINIC_OR_DEPARTMENT_OTHER)
Admission: RE | Admit: 2015-08-07 | Discharge: 2015-08-07 | Disposition: A | Discharge status: Home / Self Care | Payer: BLUE CROSS/BLUE SHIELD | Source: Ambulatory Visit | Attending: Family Medicine | Admitting: Family Medicine

## 2015-08-07 VITALS — BP 108/84 | HR 94 | Temp 98.4°F | Ht 68.0 in | Wt 294.4 lb

## 2015-08-07 DIAGNOSIS — M79672 Pain in left foot: Secondary | ICD-10-CM

## 2015-08-07 DIAGNOSIS — D509 Iron deficiency anemia, unspecified: Secondary | ICD-10-CM

## 2015-08-07 DIAGNOSIS — E785 Hyperlipidemia, unspecified: Secondary | ICD-10-CM | POA: Diagnosis not present

## 2015-08-07 DIAGNOSIS — E041 Nontoxic single thyroid nodule: Secondary | ICD-10-CM | POA: Diagnosis not present

## 2015-08-07 DIAGNOSIS — E669 Obesity, unspecified: Secondary | ICD-10-CM

## 2015-08-07 DIAGNOSIS — J329 Chronic sinusitis, unspecified: Secondary | ICD-10-CM

## 2015-08-07 DIAGNOSIS — K219 Gastro-esophageal reflux disease without esophagitis: Secondary | ICD-10-CM

## 2015-08-07 HISTORY — DX: Chronic sinusitis, unspecified: J32.9

## 2015-08-07 HISTORY — DX: Hyperlipidemia, unspecified: E78.5

## 2015-08-07 MED ORDER — CEFDINIR 300 MG PO CAPS: 300.0000 mg | ORAL_CAPSULE | Freq: Two times a day (BID) | ORAL | Status: AC

## 2015-08-07 MED FILL — CEFDINIR 300 MG CAPSULE: 300 | 10 days supply | Qty: 20 | Fill #0

## 2015-08-07 NOTE — Progress Notes (Signed)
Subjective:    Patient ID: [name], female    DOB: Sep 05, 1970, 45 y.o.   MRN: [id number]  Chief Complaint  Patient presents with  . Follow-up    sinus infection    HPI Patient is in today for evaluation of numerous concerns. She has not been seen by this office, she tried to establish with an office closer to her home but has decided her care will continue here. She is struggling with congestion, headache and malaise. She has had symptoms for over a year. She is also struggling with increased foot pain, notes thick, cracked skin on left foot. No redness or warmth. Denies CP/palp/SOB/HA/congestion/fevers/GI or GU c/o. Taking meds as prescribed  Past Medical History  Diagnosis Date  . Fibromyalgia   . Obesity   . Melanoma ([place])     right hip and knee  . Chicken pox   . Allergy   . Migraine   . UTI (lower urinary tract infection)   . GERD (gastroesophageal reflux disease)   . Anxiety disorder   . Anemia     in the past  . Depression     in teh past- ok now  . Shortness of breath     when anemic  . Complication of anesthesia 1999    urinary retention  . Neck pain on right side 12/23/2012  . Left thyroid nodule 03/31/2013  . Pain 03/31/2013  . TIA (transient ischemic attack) 03/31/2013  . Palpitations 08/10/2014  . Hyperlipidemia 08/07/2015  . Sinusitis 08/07/2015  . Anxiety     Past Surgical History  Procedure Laterality Date  . Back surgery    . Cholecystectomy    . Wisdom tooth extraction  1989  . Melanoma excision  2011    stage I and II  . Laparoscopic assisted vaginal hysterectomy N/A 09/13/2012    Procedure: LAPAROSCOPIC ASSISTED VAGINAL HYSTERECTOMY;  Surgeon: Lavonia Drafts, MD;  Location: [facility];  Service: Gynecology;  Laterality: N/A;  . Bilateral salpingectomy Bilateral 09/13/2012    Procedure: BILATERAL SALPINGECTOMY;  Surgeon: Lavonia Drafts, MD;  Location: [facility];  Service: Gynecology;  Laterality: Bilateral;  . Abdominal hysterectomy     . Colonoscopy      Family History  Problem Relation Age of Onset  . Colon cancer Neg Hx   . Colitis Father     crohn's  . Heart disease Father     grandfather  . Heart attack Father 57  . Arthritis Father     s/p back surgery  . Diabetes      grandfather  . Irritable bowel syndrome    . Kidney disease      grandfather  . Arthritis Other   . Cancer Other     breast  . Hyperlipidemia Other   . Mental illness Other   . Other Mother     neurological autoimmune disease, chorea in trunk  . Emphysema Maternal Uncle   . Arthritis Maternal Grandmother   . Lupus Maternal Grandmother   . Dementia Maternal Grandmother   . CVA Maternal Grandmother   . Arthritis Paternal Grandmother   . Heart disease Paternal Grandmother     chf  . Diabetes Paternal Grandfather   . Kidney disease Paternal Grandfather   . Hypertension Paternal Grandfather   . Hyperlipidemia Paternal Grandfather   . Cancer Maternal Grandfather     bone    Social History   Social History  . Marital Status: Divorced    Spouse Name: N/A  .  Number of Children: N/A  . Years of Education: N/A   Occupational History  . unemployed    Social History Main Topics  . Smoking status: Never Smoker   . Smokeless tobacco: Never Used     Comment: never used tobacco  . Alcohol Use: No  . Drug Use: No  . Sexual Activity: Not Currently    Birth Control/ Protection: None   Other Topics Concern  . Not on file   Social History Narrative   Daily caffeine    Outpatient Prescriptions Prior to Visit  Medication Sig Dispense Refill  . pantoprazole (PROTONIX) 40 MG tablet Take 1 tablet (40 mg total) by mouth 2 (two) times daily. 60 tablet 3  . diazepam (VALIUM) 10 MG tablet Take 1 tablet (10 mg total) by mouth every 12 (twelve) hours as needed for anxiety or sleep. 40 tablet 3  . pantoprazole (PROTONIX) 40 MG tablet Take 40 mg by mouth daily. Take 1 tab every morning.     No facility-administered medications prior to  visit.    Allergies  Allergen Reactions  . Adhesive [Tape] Dermatitis    Band- Aid adhesive  . Aleve [Naproxen Sodium]     Makes hands, legs, and feet swell  . Carbamazepine     Hallucinations and dizziness    Review of Systems  Constitutional: Positive for fever and malaise/fatigue.  HENT: Positive for congestion.   Eyes: Negative for discharge.  Respiratory: Negative for shortness of breath.   Cardiovascular: Negative for chest pain, palpitations and leg swelling.  Gastrointestinal: Negative for nausea and abdominal pain.  Genitourinary: Negative for dysuria.  Musculoskeletal: Positive for joint pain. Negative for falls.  Skin: Negative for rash.  Neurological: Positive for headaches. Negative for loss of consciousness.  Endo/Heme/Allergies: Negative for environmental allergies.  Psychiatric/Behavioral: Negative for depression. The patient is not nervous/anxious.        Objective:    Physical Exam  Constitutional: She is oriented to person, place, and time. She appears well-developed and well-nourished. No distress.  HENT:  Head: Normocephalic and atraumatic.  Nose: Nose normal.  TMs dull, nares are boggy and erythematous  Eyes: Right eye exhibits no discharge. Left eye exhibits no discharge.  Neck: Normal range of motion. Neck supple.  Cardiovascular: Normal rate and regular rhythm.   No murmur heard. Pulmonary/Chest: Effort normal and breath sounds normal.  Abdominal: Soft. Bowel sounds are normal. There is no tenderness.  Musculoskeletal: She exhibits no edema.  Neurological: She is alert and oriented to person, place, and time.  Skin: Skin is warm and dry.  Psychiatric: She has a normal mood and affect.  Nursing note and vitals reviewed.   BP 108/84 mmHg  Pulse 94  Temp(Src) 98.4 F (36.9 C) (Oral)  Ht 5\' 8"  (1.727 m)  Wt 294 lb 6 oz (133.528 kg)  BMI 44.77 kg/m2  SpO2 100%  LMP 08/13/2012 Wt Readings from Last 3 Encounters:  08/12/15 294 lb (133.358  kg)  08/10/15 297 lb (134.718 kg)  08/07/15 294 lb 6 oz (133.528 kg)     Lab Results  Component Value Date   WBC 6.9 08/10/2015   HGB 14.5 08/10/2015   HCT 42.2 08/10/2015   PLT 269 08/10/2015   GLUCOSE 84 09/26/2013   CHOL 219* 07/28/2014   TRIG 194* 09/26/2013   HDL 38* 09/26/2013   LDLCALC 114* 09/26/2013   ALT 11 09/26/2013   AST 10 09/26/2013   NA 138 09/26/2013   K 4.2 09/26/2013   CL  106 09/26/2013   CREATININE 0.71 09/26/2013   BUN 12 09/26/2013   CO2 23 09/26/2013   TSH 1.56 07/31/2014   INR 0.98 09/03/2010   HGBA1C 5.4 12/10/2010    Lab Results  Component Value Date   TSH 1.56 07/31/2014   Lab Results  Component Value Date   WBC 6.9 08/10/2015   HGB 14.5 08/10/2015   HCT 42.2 08/10/2015   MCV 91 08/10/2015   PLT 269 08/10/2015   Lab Results  Component Value Date   NA 138 09/26/2013   K 4.2 09/26/2013   CO2 23 09/26/2013   GLUCOSE 84 09/26/2013   BUN 12 09/26/2013   CREATININE 0.71 09/26/2013   BILITOT 0.3 09/26/2013   ALKPHOS 77 09/26/2013   AST 10 09/26/2013   ALT 11 09/26/2013   PROT 6.6 09/26/2013   ALBUMIN 3.9 09/26/2013   CALCIUM 9.1 09/26/2013   Lab Results  Component Value Date   CHOL 219* 07/28/2014   Lab Results  Component Value Date   HDL 38* 09/26/2013   Lab Results  Component Value Date   LDLCALC 114* 09/26/2013   Lab Results  Component Value Date   TRIG 194* 09/26/2013   Lab Results  Component Value Date   CHOLHDL 5.0 09/26/2013   Lab Results  Component Value Date   HGBA1C 5.4 12/10/2010       Assessment & Plan:   Problem List Items Addressed This Visit    GERD    Avoid offending foods, start probiotics. Do not eat large meals in late evening and consider raising head of bed.       Hyperlipidemia    Encouraged heart healthy diet, increase exercise, avoid trans fats, consider a krill oil cap daily      Iron deficiency anemia    Referred to hematology.       Relevant Orders   Ambulatory referral  to Hematology   Left foot pain - Primary    Referred to podiatry for further consideration      Relevant Orders   DG Foot Complete Left (Completed)   Ambulatory referral to Podiatry   Obesity    Encouraged DASH diet, decrease po intake and increase exercise as tolerated. Needs 7-8 hours of sleep nightly. Avoid trans fats, eat small, frequent meals every 4-5 hours with lean proteins, complex carbs and healthy fats. Minimize simple carbs      Sinusitis   Relevant Medications   cefdinir (OMNICEF) 300 MG capsule   Thyroid nodule    Ultrasound is ordered       Relevant Orders   US Soft Tissue Head/Neck (Completed)      I am having Ms. Wiedel start on cefdinir. I am also having her maintain her diazepam and pantoprazole.  Meds ordered this encounter  Medications  . cefdinir (OMNICEF) 300 MG capsule    Sig: Take 1 capsule (300 mg total) by mouth 2 (two) times daily.    Dispense:  20 capsule    Refill:  0     Stacy Blyth,MD

## 2015-08-07 NOTE — Patient Instructions (Addendum)
Salonpas Twice daily as needed           [email or web address]  and [email or web address]           Now  Company 10 strain probiotic and [employer], Vitamin D 2000, Mucinex twice a day    Sinusitis, Adult   Sinusitis is redness, soreness, and inflammation of the paranasal sinuses. Paranasal sinuses are air pockets within the bones of your face. They are located beneath your eyes, in the middle of your forehead, and above your eyes. In healthy paranasal sinuses, mucus is able to drain out, and air is able to circulate through them by way of your nose. However, when your paranasal sinuses are inflamed, mucus and air can become trapped. This can allow bacteria and other germs to grow and cause infection. Sinusitis can develop quickly and last only a short time (acute) or continue over a long period (chronic). Sinusitis that lasts for more than 12 weeks is considered chronic. CAUSES Causes of sinusitis include:  Allergies.  Structural abnormalities, such as displacement of the cartilage that separates your nostrils (deviated septum), which can decrease the air flow through your nose and sinuses and affect sinus drainage.  Functional abnormalities, such as when the small hairs (cilia) that line your sinuses and help remove mucus do not work properly or are not present. SIGNS AND SYMPTOMS Symptoms of acute and chronic sinusitis are the same. The primary symptoms are pain and pressure around the affected sinuses. Other symptoms include:  Upper toothache.  Earache.  Headache.  Bad breath.  Decreased sense of smell and taste.  A cough, which worsens when you are lying flat.  Fatigue.  Fever.  Thick drainage from your nose, which often is green and may contain pus (purulent).  Swelling and warmth over the affected sinuses. DIAGNOSIS Your health care provider will perform a physical exam. During your exam, your health care provider may perform any of the following to help determine if you have  acute sinusitis or chronic sinusitis:  Look in your nose for signs of abnormal growths in your nostrils (nasal polyps).  Tap over the affected sinus to check for signs of infection.  View the inside of your sinuses using an imaging device that has a light attached (endoscope). If your health care provider suspects that you have chronic sinusitis, one or more of the following tests may be recommended:  Allergy tests.  Nasal culture. A sample of mucus is taken from your nose, sent to a lab, and screened for bacteria.  Nasal cytology. A sample of mucus is taken from your nose and examined by your health care provider to determine if your sinusitis is related to an allergy. TREATMENT Most cases of acute sinusitis are related to a viral infection and will resolve on their own within 10 days. Sometimes, medicines are prescribed to help relieve symptoms of both acute and chronic sinusitis. These may include pain medicines, decongestants, nasal steroid sprays, or saline sprays. However, for sinusitis related to a bacterial infection, your health care provider will prescribe antibiotic medicines. These are medicines that will help kill the bacteria causing the infection. Rarely, sinusitis is caused by a fungal infection. In these cases, your health care provider will prescribe antifungal medicine. For some cases of chronic sinusitis, surgery is needed. Generally, these are cases in which sinusitis recurs more than 3 times per year, despite other treatments. HOME CARE INSTRUCTIONS  Drink plenty of water. Water helps thin the mucus so your sinuses can drain more  easily.  Use a humidifier.  Inhale steam 3-4 times a day (for example, sit in the bathroom with the shower running).  Apply a warm, moist washcloth to your face 3-4 times a day, or as directed by your health care provider.  Use saline nasal sprays to help moisten and clean your sinuses.  Take medicines only as directed by your health care  provider.  If you were prescribed either an antibiotic or antifungal medicine, finish it all even if you start to feel better. SEEK IMMEDIATE MEDICAL CARE IF:  You have increasing pain or severe headaches.  You have nausea, vomiting, or drowsiness.  You have swelling around your face.  You have vision problems.  You have a stiff neck.  You have difficulty breathing.   This information is not intended to replace advice given to you by your health care provider. Make sure you discuss any questions you have with your health care provider.   Document Released: 07/04/2005 Document Revised: 07/25/2014 Document Reviewed: 07/19/2011 Elsevier Interactive Patient Education [employer].

## 2015-08-07 NOTE — Progress Notes (Signed)
Pre visit review using our clinic review tool, if applicable. No additional management support is needed unless otherwise documented below in the visit note. 

## 2015-08-10 ENCOUNTER — Ambulatory Visit (HOSPITAL_BASED_OUTPATIENT_CLINIC_OR_DEPARTMENT_OTHER)
Admission: RE | Admit: 2015-08-10 | Discharge: 2015-08-10 | Disposition: A | Discharge status: Home / Self Care | Payer: BLUE CROSS/BLUE SHIELD | Source: Ambulatory Visit | Attending: Family Medicine | Admitting: Family Medicine

## 2015-08-10 ENCOUNTER — Ambulatory Visit (HOSPITAL_BASED_OUTPATIENT_CLINIC_OR_DEPARTMENT_OTHER): Payer: BLUE CROSS/BLUE SHIELD | Admitting: Hematology & Oncology

## 2015-08-10 ENCOUNTER — Ambulatory Visit: Payer: Self-pay

## 2015-08-10 ENCOUNTER — Encounter: Payer: Self-pay | Admitting: Hematology & Oncology

## 2015-08-10 ENCOUNTER — Other Ambulatory Visit (HOSPITAL_BASED_OUTPATIENT_CLINIC_OR_DEPARTMENT_OTHER): Payer: BLUE CROSS/BLUE SHIELD

## 2015-08-10 VITALS — BP 120/67 | HR 77 | Temp 97.8°F | Resp 16 | Ht 68.0 in | Wt 297.0 lb

## 2015-08-10 DIAGNOSIS — E041 Nontoxic single thyroid nodule: Secondary | ICD-10-CM

## 2015-08-10 DIAGNOSIS — E042 Nontoxic multinodular goiter: Secondary | ICD-10-CM | POA: Diagnosis not present

## 2015-08-10 DIAGNOSIS — D509 Iron deficiency anemia, unspecified: Secondary | ICD-10-CM

## 2015-08-10 LAB — CBC WITH DIFFERENTIAL (CANCER CENTER ONLY)
BASO#: 0 10*3/uL (ref 0.0–0.2)
BASO%: 0.6 % (ref 0.0–2.0)
EOS%: 1.4 % (ref 0.0–7.0)
Eosinophils Absolute: 0.1 10*3/uL (ref 0.0–0.5)
HCT: 42.2 % (ref 34.8–46.6)
HGB: 14.5 g/dL (ref 11.6–15.9)
LYMPH#: 1.4 10*3/uL (ref 0.9–3.3)
LYMPH%: 20.7 % (ref 14.0–48.0)
MCH: 31.4 pg (ref 26.0–34.0)
MCHC: 34.4 g/dL (ref 32.0–36.0)
MCV: 91 fL (ref 81–101)
MONO#: 0.4 10*3/uL (ref 0.1–0.9)
MONO%: 6.2 % (ref 0.0–13.0)
NEUT#: 4.9 10*3/uL (ref 1.5–6.5)
NEUT%: 71.1 % (ref 39.6–80.0)
Platelets: 269 10*3/uL (ref 145–400)
RBC: 4.62 10*6/uL (ref 3.70–5.32)
RDW: 12.7 % (ref 11.1–15.7)
WBC: 6.9 10*3/uL (ref 3.9–10.0)

## 2015-08-10 LAB — CHCC SATELLITE - SMEAR

## 2015-08-10 LAB — IRON AND TIBC
%SAT: 45 % (ref 21–57)
Iron: 98 ug/dL (ref 41–142)
TIBC: 215 ug/dL — ABNORMAL LOW (ref 236–444)
UIBC: 118 ug/dL — ABNORMAL LOW (ref 120–384)

## 2015-08-10 NOTE — Progress Notes (Signed)
[facility]  Telephone:([phone]) [phone] Fax:(336) [phone]  ID: [name] OB: Apr 25, 1971 MR#: [id number] Patient Care Team: Mosie Lukes, MD as PCP - General (Family Medicine) Kathlee Nations, MD (Psychiatry) Irene Shipper, MD (Gastroenterology) Nira Retort, MD (Hematology and Oncology) Coralie Keens, MD (General Surgery)  DIAGNOSIS: Intermittent iron deficiency anemia   INTERVAL HISTORY: [name] is here today. It is been a year since we saw her. She had insurance issues. Now, she has insurance, and she is able to come back.  She feels tired. She's had no obvious bleeding. She has had a hysterectomy. She is gaining more weight. She's thinking about having a gastric bypass. I don't think this will be a bad idea.  She's had no cough.  She is now married. 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 I'm very happy for her.  Only last saw her, her iron studies showed ferritin of 209. Iron saturation was 35%. Total iron was 94.   She's having a lot of problems with gastric reflux. She is on Protonix. His probably will prevent her from absorbing iron.  She's had no obvious rashes. She's had no leg swelling. She's had no fever.  She is going for a thyroid ultrasound as she has chronic thyroid nodules.  She's also going for an upper endoscopy.  Overall, her performance status is ECOG 0.    CURRENT TREATMENT: IV iron as indicated  REVIEW OF SYSTEMS: All other 10 point review of systems is negative.   PAST MEDICAL HISTORY: Past Medical History  Diagnosis Date  . Fibromyalgia   . Obesity   . Melanoma ([place])     right hip and knee  . Chicken pox   . Allergy   . Migraine   . UTI (lower urinary tract infection)   . GERD (gastroesophageal reflux disease)   . Anxiety disorder   . Anemia     in the past  . Depression     in teh past- ok now  . Shortness of breath     when anemic  . Complication of anesthesia 1999     urinary retention  . Neck pain on right side 12/23/2012  . Left thyroid nodule 03/31/2013  . Pain 03/31/2013  . TIA (transient ischemic attack) 03/31/2013  . Palpitations 08/10/2014  . Hyperlipidemia 08/07/2015  . Sinusitis 08/07/2015    PAST SURGICAL HISTORY: Past Surgical History  Procedure Laterality Date  . Back surgery    . Cholecystectomy    . Wisdom tooth extraction  1989  . Melanoma excision  2011    stage I and II  . Laparoscopic assisted vaginal hysterectomy N/A 09/13/2012    Procedure: LAPAROSCOPIC ASSISTED VAGINAL HYSTERECTOMY;  Surgeon: Lavonia Drafts, MD;  Location: [facility];  Service: Gynecology;  Laterality: N/A;  . Bilateral salpingectomy Bilateral 09/13/2012    Procedure: BILATERAL SALPINGECTOMY;  Surgeon: Lavonia Drafts, MD;  Location: [facility];  Service: Gynecology;  Laterality: Bilateral;  . Abdominal hysterectomy      FAMILY HISTORY Family History  Problem Relation Age of Onset  . Colon cancer Neg Hx   . Colitis Father     crohn's  . Heart disease Father     grandfather  . Heart attack Father 46  . Arthritis Father     s/p back surgery  . Diabetes      grandfather  . Irritable bowel syndrome    . Kidney disease  grandfather  . Arthritis Other   . Cancer Other     breast  . Hyperlipidemia Other   . Mental illness Other   . Other Mother     neurological autoimmune disease, chorea in trunk  . Emphysema Maternal Uncle   . Arthritis Maternal Grandmother   . Lupus Maternal Grandmother   . Dementia Maternal Grandmother   . CVA Maternal Grandmother   . Arthritis Paternal Grandmother   . Heart disease Paternal Grandmother     chf  . Diabetes Paternal Grandfather   . Kidney disease Paternal Grandfather   . Hypertension Paternal Grandfather   . Hyperlipidemia Paternal Grandfather   . Cancer Maternal Grandfather     bone    GYNECOLOGIC HISTORY:  Patient's last menstrual period was 08/13/2012.   SOCIAL HISTORY: Social History    Social History  . Marital Status: Divorced    Spouse Name: N/A  . Number of Children: N/A  . Years of Education: N/A   Occupational History  . unemployed    Social History Main Topics  . Smoking status: Never Smoker   . Smokeless tobacco: Never Used     Comment: never used tobacco  . Alcohol Use: No  . Drug Use: No  . Sexual Activity: Not Currently    Birth Control/ Protection: None   Other Topics Concern  . Not on file   Social History Narrative   Daily caffeine    ADVANCED DIRECTIVES:  <no information>  HEALTH MAINTENANCE: Social History  Substance Use Topics  . Smoking status: Never Smoker   . Smokeless tobacco: Never Used     Comment: never used tobacco  . Alcohol Use: No   Colonoscopy: PAP: Bone density: Lipid panel:  Allergies  Allergen Reactions  . Adhesive [Tape] Dermatitis    Band- Aid adhesive  . Aleve [Naproxen Sodium]     Makes hands, legs, and feet swell  . Carbamazepine     Hallucinations and dizziness    Current Outpatient Prescriptions  Medication Sig Dispense Refill  . cefdinir (OMNICEF) 300 MG capsule Take 1 capsule (300 mg total) by mouth 2 (two) times daily. 20 capsule 0  . diazepam (VALIUM) 10 MG tablet Take 1 tablet (10 mg total) by mouth every 12 (twelve) hours as needed for anxiety or sleep. 40 tablet 3  . pantoprazole (PROTONIX) 40 MG tablet Take 1 tablet (40 mg total) by mouth 2 (two) times daily. 60 tablet 3   No current facility-administered medications for this visit.    OBJECTIVE: Filed Vitals:   08/10/15 0830  BP: 120/67  Pulse: 77  Temp: 97.8 F (36.6 C)  Resp: 16    Filed Weights   08/10/15 0830  Weight: 297 lb (134.718 kg)   obese white female in no obvious distress. Head and neck exam shows no ocular or oral lesions. She has no conjunctival pallor. She has no adenopathy in the neck. Thyroid is nonpalpable. Lungs are clear. Cardiac exam regular rate and rhythm with no murmurs, rubs or bruits. Abdomen is  soft. She has good bowel sounds. There is no fluid wave. There is no palpable hepatosplenomegaly. She is wobbly obese. Extremities shows no clubbing, cyanosis or edema. Back exam shows no tenderness over the spine, ribs or hips. Skin exam shows no rashes, ecchymoses or petechia. Neurological exam shows no focal neurological deficits  LAB RESULTS: CMP     Component Value Date/Time   NA 138 09/26/2013 1524   NA 138 08/24/2012 0917   K  4.2 09/26/2013 1524   K 4.4 08/24/2012 0917   CL 106 09/26/2013 1524   CL 102 08/24/2012 0917   CO2 23 09/26/2013 1524   CO2 26 08/24/2012 0917   GLUCOSE 84 09/26/2013 1524   GLUCOSE 89 08/24/2012 0917   BUN 12 09/26/2013 1524   BUN 13.5 08/24/2012 0917   CREATININE 0.71 09/26/2013 1524   CREATININE 0.70 08/28/2013 1130   CREATININE 0.8 08/24/2012 0917   CALCIUM 9.1 09/26/2013 1524   CALCIUM 9.3 08/24/2012 0917   PROT 6.6 09/26/2013 1524   ALBUMIN 3.9 09/26/2013 1524   AST 10 09/26/2013 1524   ALT 11 09/26/2013 1524   ALKPHOS 77 09/26/2013 1524   BILITOT 0.3 09/26/2013 1524   GFRNONAA >90 08/28/2013 1130   GFRAA >90 08/28/2013 1130   INo results found for: SPEP, UPEP Lab Results  Component Value Date   WBC 6.9 08/10/2015   NEUTROABS 4.9 08/10/2015   HGB 14.5 08/10/2015   HCT 42.2 08/10/2015   MCV 91 08/10/2015   PLT 269 08/10/2015   No results found for: LABCA2 No components found for: LABCA125 No results for input(s): INR in the last 168 hours.  STUDIES: Dg Foot Complete Left  08/07/2015  CLINICAL DATA:  Left foot pain. No known injury. Initial evaluation . EXAM: LEFT FOOT - COMPLETE 3+ VIEW COMPARISON:  None. FINDINGS: No acute bony or joint abnormality identified. No evidence of fracture dislocation. IMPRESSION: No acute abnormality. Electronically Signed   By: Marcello Moores  Register   On: 08/07/2015 12:15    ASSESSMENT/PLAN: [name] is a very pleasant 44 year old white female. She has iron deficiency anemia. Even though she is not  anemic, her blood, Deyle tach, does show some hypochromic red cells. It will be interesting to see what her iron studies show.  I think the fact that she takes the Protonix will prevent her from absorbing iron.   We talked about having gastric bypass for her. I don't think it would be about idea. I gave her the name of Dr. Kaylyn Lim who is very skilled in this procedure.  He will be nice to get her back in about 4 months. We will see her iron studies show and if she has low iron, we'll get her in sooner.    Volanda Napoleon, MD 08/10/2015 9:00 AM

## 2015-08-10 NOTE — Progress Notes (Signed)
No treatment need today per Dr. Marin Olp.

## 2015-08-11 ENCOUNTER — Telehealth: Payer: Self-pay | Admitting: *Deleted

## 2015-08-11 LAB — RETICULOCYTES: RETICULOCYTE COUNT: 1.5 % (ref 0.6–2.6)

## 2015-08-11 NOTE — Telephone Encounter (Addendum)
Patient aware of results  ----- Message from Volanda Napoleon, MD sent at 08/10/2015  3:32 PM EST ----- Call - iron is ok!!  [name]

## 2015-08-12 ENCOUNTER — Encounter: Payer: Self-pay | Admitting: Internal Medicine

## 2015-08-12 ENCOUNTER — Encounter: Payer: Self-pay | Admitting: Gastroenterology

## 2015-08-12 ENCOUNTER — Ambulatory Visit (AMBULATORY_SURGERY_CENTER): Payer: BLUE CROSS/BLUE SHIELD | Admitting: Internal Medicine

## 2015-08-12 VITALS — BP 113/69 | HR 68 | Temp 97.8°F | Resp 20 | Ht 68.0 in | Wt 294.0 lb

## 2015-08-12 DIAGNOSIS — R112 Nausea with vomiting, unspecified: Secondary | ICD-10-CM | POA: Insufficient documentation

## 2015-08-12 DIAGNOSIS — G43A1 Cyclical vomiting, intractable: Secondary | ICD-10-CM

## 2015-08-12 DIAGNOSIS — K2100 Gastro-esophageal reflux disease with esophagitis, without bleeding: Secondary | ICD-10-CM

## 2015-08-12 DIAGNOSIS — R1115 Cyclical vomiting syndrome unrelated to migraine: Secondary | ICD-10-CM

## 2015-08-12 DIAGNOSIS — K449 Diaphragmatic hernia without obstruction or gangrene: Secondary | ICD-10-CM | POA: Diagnosis not present

## 2015-08-12 DIAGNOSIS — K219 Gastro-esophageal reflux disease without esophagitis: Secondary | ICD-10-CM

## 2015-08-12 DIAGNOSIS — K222 Esophageal obstruction: Secondary | ICD-10-CM | POA: Insufficient documentation

## 2015-08-12 DIAGNOSIS — R1310 Dysphagia, unspecified: Secondary | ICD-10-CM | POA: Insufficient documentation

## 2015-08-12 MED ORDER — SODIUM CHLORIDE 0.9 % IV SOLN: 500.0000 mL | INTRAVENOUS | Status: DC

## 2015-08-12 NOTE — Progress Notes (Signed)
Report to PACU, RN, vss, BBS= Clear.  

## 2015-08-12 NOTE — Patient Instructions (Signed)
YOU HAD AN ENDOSCOPIC PROCEDURE TODAY AT [facility]:   Refer to the procedure report that was given to you for any specific questions about what was found during the examination.  If the procedure report does not answer your questions, please call your gastroenterologist to clarify.  If you requested that your care partner not be given the details of your procedure findings, then the procedure report has been included in a sealed envelope for you to review at your convenience later.  YOU SHOULD EXPECT: Some feelings of bloating in the abdomen. Passage of more gas than usual.  Walking can help get rid of the air that was put into your GI tract during the procedure and reduce the bloating. If you had a lower endoscopy (such as a colonoscopy or flexible sigmoidoscopy) you may notice spotting of blood in your stool or on the toilet paper. If you underwent a bowel prep for your procedure, you may not have a normal bowel movement for a few days.  Please Note:  You might notice some irritation and congestion in your nose or some drainage.  This is from the oxygen used during your procedure.  There is no need for concern and it should clear up in a day or so.  SYMPTOMS TO REPORT IMMEDIATELY:     Following upper endoscopy (EGD)  Vomiting of blood or coffee ground material  New chest pain or pain under the shoulder blades  Painful or persistently difficult swallowing  New shortness of breath  Fever of 100F or higher  Black, tarry-looking stools  For urgent or emergent issues, a gastroenterologist can be reached at any hour by calling [phone].   DIET: Your first meal following the procedure should be a small meal and then it is ok to progress to your normal diet. Heavy or fried foods are harder to digest and may make you feel nauseous or bloated.  Likewise, meals heavy in dairy and vegetables can increase bloating.  Drink plenty of fluids but you should avoid alcoholic beverages  for 24 hours.  ACTIVITY:  You should plan to take it easy for the rest of today and you should NOT DRIVE or use heavy machinery until tomorrow (because of the sedation medicines used during the test).    FOLLOW UP: Our staff will call the number listed on your records the next business day following your procedure to check on you and address any questions or concerns that you may have regarding the information given to you following your procedure. If we do not reach you, we will leave a message.  However, if you are feeling well and you are not experiencing any problems, there is no need to return our call.  We will assume that you have returned to your regular daily activities without incident.  If any biopsies were taken you will be contacted by phone or by letter within the next 1-3 weeks.  Please call us at [phone] if you have not heard about the biopsies in 3 weeks.    SIGNATURES/CONFIDENTIALITY: You and/or your care partner have signed paperwork which will be entered into your electronic medical record.  These signatures attest to the fact that that the information above on your After Visit Summary has been reviewed and is understood.  Full responsibility of the confidentiality of this discharge information lies with you and/or your care-partner.   INFORMATION ON REFLUX GIVEN TO YOU TODAY- FOLLOW ANTI REFLUX MEASURES  CONTINUE ANTI REFLUX MEDICATION TWICE  DAILY

## 2015-08-12 NOTE — Op Note (Signed)
[facility]  [address] [place], [address]   ENDOSCOPY PROCEDURE REPORT  PATIENT: [name], [name]  MR#: [id number] BIRTHDATE: [birth year] , 61  yrs. old GENDER: female ENDOSCOPIST: Eustace Quail, MD REFERRED BY:  .  Self / Office PROCEDURE DATE:  08/12/2015 PROCEDURE:  EGD, diagnostic ASA CLASS:     Class III INDICATIONS:  vomiting. Nocturnal. Foodstuff. MEDICATIONS: Monitored anesthesia care and Propofol 200 mg IV TOPICAL ANESTHETIC: none  DESCRIPTION OF PROCEDURE: After the risks benefits and alternatives of the procedure were thoroughly explained, informed consent was obtained.  The LB [id number] endoscope was introduced through the mouth and advanced to the second portion of the duodenum , Without limitations.  The instrument was slowly withdrawn as the mucosa was fully examined.  EXAM:The esophagus revealed erosive changes in the distal 3 cm.  The stomach revealed a moderate hiatal hernia was otherwise normal. The duodenum was normal.  Retroflexed views revealed a hiatal hernia.     The scope was then withdrawn from the patient and the procedure completed.  COMPLICATIONS: There were no immediate complications.  ENDOSCOPIC IMPRESSION: 1. GERD with erosive esophagitis 2. Nocturnal vomiting secondary to the same 3. Obesity  RECOMMENDATIONS: 1.  Anti-reflux regimen to be followed. Most importantly is significant weight loss. Secondary measures including elevating the head of bed at night and avoiding meals 4 hours prior to bedtime 2.  Continue pantoprazole 40 mg twice daily  REPEAT EXAM:  eSigned:  Eustace Quail, MD 08/12/2015 3:51 PM    CC:The Patient and Willette Alma, MD

## 2015-08-13 ENCOUNTER — Telehealth: Payer: Self-pay | Admitting: *Deleted

## 2015-08-13 NOTE — Telephone Encounter (Signed)
  Follow up Call-  Call back number 08/12/2015  Post procedure Call Back phone  # [phone]  Permission to leave phone message Yes   [facility]

## 2015-08-16 ENCOUNTER — Encounter: Payer: Self-pay | Admitting: Family Medicine

## 2015-08-16 NOTE — Assessment & Plan Note (Signed)
Ultrasound is ordered

## 2015-08-16 NOTE — Assessment & Plan Note (Signed)
Encouraged DASH diet, decrease po intake and increase exercise as tolerated. Needs 7-8 hours of sleep nightly. Avoid trans fats, eat small, frequent meals every 4-5 hours with lean proteins, complex carbs and healthy fats. Minimize simple carbs 

## 2015-08-16 NOTE — Assessment & Plan Note (Signed)
Avoid offending foods, start probiotics. Do not eat large meals in late evening and consider raising head of bed.  

## 2015-08-16 NOTE — Assessment & Plan Note (Signed)
Referred to podiatry for further consideration 

## 2015-08-16 NOTE — Assessment & Plan Note (Signed)
Encouraged heart healthy diet, increase exercise, avoid trans fats, consider a krill oil cap daily 

## 2015-08-16 NOTE — Assessment & Plan Note (Signed)
Referred to hematology.

## 2015-08-31 ENCOUNTER — Encounter: Payer: Self-pay | Admitting: Gastroenterology

## 2015-09-08 ENCOUNTER — Ambulatory Visit (INDEPENDENT_AMBULATORY_CARE_PROVIDER_SITE_OTHER): Payer: BLUE CROSS/BLUE SHIELD | Admitting: Family Medicine

## 2015-09-08 ENCOUNTER — Encounter: Payer: Self-pay | Admitting: Family Medicine

## 2015-09-08 VITALS — BP 122/82 | HR 70 | Temp 98.3°F | Ht 68.0 in | Wt 301.2 lb

## 2015-09-08 DIAGNOSIS — E669 Obesity, unspecified: Secondary | ICD-10-CM

## 2015-09-08 DIAGNOSIS — M549 Dorsalgia, unspecified: Secondary | ICD-10-CM

## 2015-09-08 DIAGNOSIS — D649 Anemia, unspecified: Secondary | ICD-10-CM | POA: Diagnosis not present

## 2015-09-08 DIAGNOSIS — E041 Nontoxic single thyroid nodule: Secondary | ICD-10-CM

## 2015-09-08 DIAGNOSIS — F419 Anxiety disorder, unspecified: Secondary | ICD-10-CM

## 2015-09-08 DIAGNOSIS — D509 Iron deficiency anemia, unspecified: Secondary | ICD-10-CM

## 2015-09-08 DIAGNOSIS — R739 Hyperglycemia, unspecified: Secondary | ICD-10-CM

## 2015-09-08 DIAGNOSIS — F418 Other specified anxiety disorders: Secondary | ICD-10-CM

## 2015-09-08 DIAGNOSIS — K219 Gastro-esophageal reflux disease without esophagitis: Secondary | ICD-10-CM

## 2015-09-08 DIAGNOSIS — E785 Hyperlipidemia, unspecified: Secondary | ICD-10-CM

## 2015-09-08 DIAGNOSIS — E042 Nontoxic multinodular goiter: Secondary | ICD-10-CM

## 2015-09-08 DIAGNOSIS — F329 Major depressive disorder, single episode, unspecified: Secondary | ICD-10-CM

## 2015-09-08 DIAGNOSIS — F32A Depression, unspecified: Secondary | ICD-10-CM

## 2015-09-08 MED ORDER — DIAZEPAM 10 MG PO TABS: 10.0000 mg | ORAL_TABLET | Freq: Two times a day (BID) | ORAL | Status: DC | PRN

## 2015-09-08 NOTE — Progress Notes (Signed)
Pre visit review using our clinic review tool, if applicable. No additional management support is needed unless otherwise documented below in the visit note. 

## 2015-09-08 NOTE — Patient Instructions (Signed)
Metamucil/Psyllium Husks in liquid NOW company probiotic daily DASH diet [employer]  Bariatrics on [employer], pick lecture, check insurance  Gastritis, Adult Gastritis is soreness and swelling (inflammation) of the lining of the stomach. Gastritis can develop as a sudden onset (acute) or long-term (chronic) condition. If gastritis is not treated, it can lead to stomach bleeding and ulcers. CAUSES  Gastritis occurs when the stomach lining is weak or damaged. Digestive juices from the stomach then inflame the weakened stomach lining. The stomach lining may be weak or damaged due to viral or bacterial infections. One common bacterial infection is the Helicobacter pylori infection. Gastritis can also result from excessive alcohol consumption, taking certain medicines, or having too much acid in the stomach.  SYMPTOMS  In some cases, there are no symptoms. When symptoms are present, they may include:  Pain or a burning sensation in the upper abdomen.  Nausea.  Vomiting.  An uncomfortable feeling of fullness after eating. DIAGNOSIS  Your caregiver may suspect you have gastritis based on your symptoms and a physical exam. To determine the cause of your gastritis, your caregiver may perform the following:  Blood or stool tests to check for the H pylori bacterium.  Gastroscopy. A thin, flexible tube (endoscope) is passed down the esophagus and into the stomach. The endoscope has a light and camera on the end. Your caregiver uses the endoscope to view the inside of the stomach.  Taking a tissue sample (biopsy) from the stomach to examine under a microscope. TREATMENT  Depending on the cause of your gastritis, medicines may be prescribed. If you have a bacterial infection, such as an H pylori infection, antibiotics may be given. If your gastritis is caused by too much acid in the stomach, H2 blockers or antacids may be given. Your caregiver may recommend that you stop taking aspirin,  ibuprofen, or other nonsteroidal anti-inflammatory drugs (NSAIDs). HOME CARE INSTRUCTIONS  Only take over-the-counter or prescription medicines as directed by your caregiver.  If you were given antibiotic medicines, take them as directed. Finish them even if you start to feel better.  Drink enough fluids to keep your urine clear or pale yellow.  Avoid foods and drinks that make your symptoms worse, such as:  Caffeine or alcoholic drinks.  Chocolate.  Peppermint or mint flavorings.  Garlic and onions.  Spicy foods.  Citrus fruits, such as oranges, lemons, or limes.  Tomato-based foods such as sauce, chili, salsa, and pizza.  Fried and fatty foods.  Eat small, frequent meals instead of large meals. SEEK IMMEDIATE MEDICAL CARE IF:   You have black or dark red stools.  You vomit blood or material that looks like coffee grounds.  You are unable to keep fluids down.  Your abdominal pain gets worse.  You have a fever.  You do not feel better after 1 week.  You have any other questions or concerns. MAKE SURE YOU:  Understand these instructions.  Will watch your condition.  Will get help right away if you are not doing well or get worse.   This information is not intended to replace advice given to you by your health care provider. Make sure you discuss any questions you have with your health care provider.   Document Released: 06/28/2001 Document Revised: 01/03/2012 Document Reviewed: 08/17/2011 Elsevier Interactive Patient Education [employer].

## 2015-09-08 NOTE — Progress Notes (Signed)
Subjective:    Patient ID: [name], female    DOB: 08/28/1970, 45 y.o.   MRN: [id number]  Chief Complaint  Patient presents with  . Follow-up    foot    HPI Patient is in today for follow up. Foot pain is improving. Has had an endoscopy which has confirmed gastritis, is taking Pantoprazole with some relief. No bloody or tarry stool. No new or acute concerns. Denies CP/palp/SOB/HA/congestion/fevers/GU c/o. Taking meds as prescribed  Past Medical History  Diagnosis Date  . Fibromyalgia   . Obesity   . Melanoma ([place])     right hip and knee  . Chicken pox   . Allergy   . Migraine   . UTI (lower urinary tract infection)   . GERD (gastroesophageal reflux disease)   . Anxiety disorder   . Anemia     in the past  . Depression     in teh past- ok now  . Shortness of breath     when anemic  . Complication of anesthesia 1999    urinary retention  . Neck pain on right side 12/23/2012  . Left thyroid nodule 03/31/2013  . Pain 03/31/2013  . TIA (transient ischemic attack) 03/31/2013  . Palpitations 08/10/2014  . Hyperlipidemia 08/07/2015  . Sinusitis 08/07/2015  . Anxiety     Past Surgical History  Procedure Laterality Date  . Back surgery    . Cholecystectomy    . Wisdom tooth extraction  1989  . Melanoma excision  2011    stage I and II  . Laparoscopic assisted vaginal hysterectomy N/A 09/13/2012    Procedure: LAPAROSCOPIC ASSISTED VAGINAL HYSTERECTOMY;  Surgeon: Lavonia Drafts, MD;  Location: [facility];  Service: Gynecology;  Laterality: N/A;  . Bilateral salpingectomy Bilateral 09/13/2012    Procedure: BILATERAL SALPINGECTOMY;  Surgeon: Lavonia Drafts, MD;  Location: [facility];  Service: Gynecology;  Laterality: Bilateral;  . Abdominal hysterectomy    . Colonoscopy      Family History  Problem Relation Age of Onset  . Colon cancer Neg Hx   . Colitis Father     crohn's  . Heart disease Father     grandfather  . Heart attack Father 68  . Arthritis  Father     s/p back surgery  . Diabetes      grandfather  . Irritable bowel syndrome    . Kidney disease      grandfather  . Arthritis Other   . Cancer Other     breast  . Hyperlipidemia Other   . Mental illness Other   . Other Mother     neurological autoimmune disease, chorea in trunk  . Emphysema Maternal Uncle   . Arthritis Maternal Grandmother   . Lupus Maternal Grandmother   . Dementia Maternal Grandmother   . CVA Maternal Grandmother   . Arthritis Paternal Grandmother   . Heart disease Paternal Grandmother     chf  . Diabetes Paternal Grandfather   . Kidney disease Paternal Grandfather   . Hypertension Paternal Grandfather   . Hyperlipidemia Paternal Grandfather   . Cancer Maternal Grandfather     bone    Social History   Social History  . Marital Status: Divorced    Spouse Name: N/A  . Number of Children: N/A  . Years of Education: N/A   Occupational History  . unemployed    Social History Main Topics  . Smoking status: Never Smoker   . Smokeless tobacco: Never Used  Comment: never used tobacco  . Alcohol Use: No  . Drug Use: No  . Sexual Activity: Not Currently    Birth Control/ Protection: None   Other Topics Concern  . Not on file   Social History Narrative   Daily caffeine    Outpatient Prescriptions Prior to Visit  Medication Sig Dispense Refill  . pantoprazole (PROTONIX) 40 MG tablet Take 1 tablet (40 mg total) by mouth 2 (two) times daily. 60 tablet 3  . diazepam (VALIUM) 10 MG tablet Take 1 tablet (10 mg total) by mouth every 12 (twelve) hours as needed for anxiety or sleep. 40 tablet 3   No facility-administered medications prior to visit.    Allergies  Allergen Reactions  . Adhesive [Tape] Dermatitis    Band- Aid adhesive  . Aleve [Naproxen Sodium]     Makes hands, legs, and feet swell  . Carbamazepine     Hallucinations and dizziness    Review of Systems  Constitutional: Positive for malaise/fatigue. Negative for fever.   HENT: Negative for congestion.   Eyes: Negative for discharge.  Respiratory: Negative for shortness of breath.   Cardiovascular: Negative for chest pain, palpitations and leg swelling.  Gastrointestinal: Positive for heartburn and abdominal pain. Negative for nausea.  Genitourinary: Negative for dysuria.  Musculoskeletal: Negative for falls.  Skin: Negative for rash.  Neurological: Negative for loss of consciousness and headaches.  Endo/Heme/Allergies: Negative for environmental allergies.  Psychiatric/Behavioral: Negative for depression. The patient is not nervous/anxious.        Objective:    Physical Exam  Constitutional: She is oriented to person, place, and time. She appears well-developed and well-nourished. No distress.  HENT:  Head: Normocephalic and atraumatic.  Nose: Nose normal.  Eyes: Right eye exhibits no discharge. Left eye exhibits no discharge.  Neck: Normal range of motion. Neck supple.  Cardiovascular: Normal rate and regular rhythm.   No murmur heard. Pulmonary/Chest: Effort normal and breath sounds normal.  Abdominal: Soft. Bowel sounds are normal. There is no tenderness.  Musculoskeletal: She exhibits no edema.  Neurological: She is alert and oriented to person, place, and time.  Skin: Skin is warm and dry.  Psychiatric: She has a normal mood and affect.  Nursing note and vitals reviewed.   BP 122/82 mmHg  Pulse 70  Temp(Src) 98.3 F (36.8 C) (Oral)  Ht 5\' 8"  (1.727 m)  Wt 301 lb 4 oz (136.646 kg)  BMI 45.82 kg/m2  SpO2 97%  LMP 08/13/2012 Wt Readings from Last 3 Encounters:  09/08/15 301 lb 4 oz (136.646 kg)  08/12/15 294 lb (133.358 kg)  08/10/15 297 lb (134.718 kg)     Lab Results  Component Value Date   WBC 6.9 08/10/2015   HGB 14.5 08/10/2015   HCT 42.2 08/10/2015   PLT 269 08/10/2015   GLUCOSE 84 09/26/2013   CHOL 219* 07/28/2014   TRIG 194* 09/26/2013   HDL 38* 09/26/2013   LDLCALC 114* 09/26/2013   ALT 11 09/26/2013   AST 10  09/26/2013   NA 138 09/26/2013   K 4.2 09/26/2013   CL 106 09/26/2013   CREATININE 0.71 09/26/2013   BUN 12 09/26/2013   CO2 23 09/26/2013   TSH 1.56 07/31/2014   INR 0.98 09/03/2010   HGBA1C 5.4 12/10/2010    Lab Results  Component Value Date   TSH 1.56 07/31/2014   Lab Results  Component Value Date   WBC 6.9 08/10/2015   HGB 14.5 08/10/2015   HCT 42.2 08/10/2015  MCV 91 08/10/2015   PLT 269 08/10/2015   Lab Results  Component Value Date   NA 138 09/26/2013   K 4.2 09/26/2013   CO2 23 09/26/2013   GLUCOSE 84 09/26/2013   BUN 12 09/26/2013   CREATININE 0.71 09/26/2013   BILITOT 0.3 09/26/2013   ALKPHOS 77 09/26/2013   AST 10 09/26/2013   ALT 11 09/26/2013   PROT 6.6 09/26/2013   ALBUMIN 3.9 09/26/2013   CALCIUM 9.1 09/26/2013   Lab Results  Component Value Date   CHOL 219* 07/28/2014   Lab Results  Component Value Date   HDL 38* 09/26/2013   Lab Results  Component Value Date   LDLCALC 114* 09/26/2013   Lab Results  Component Value Date   TRIG 194* 09/26/2013   Lab Results  Component Value Date   CHOLHDL 5.0 09/26/2013   Lab Results  Component Value Date   HGBA1C 5.4 12/10/2010       Assessment & Plan:   Problem List Items Addressed This Visit    Anxiety and depression   Relevant Medications   diazepam (VALIUM) 10 MG tablet   Other Relevant Orders   CBC   TSH   Comprehensive metabolic panel   Lipid panel   Back pain - Primary   Relevant Medications   diazepam (VALIUM) 10 MG tablet   Other Relevant Orders   CBC   TSH   Comprehensive metabolic panel   Lipid panel   GERD    Endoscopy has confirmed gastritis, Avoid offending foods, start probiotics. Do not eat large meals in late evening and consider raising head of bed. Pantoprazole is helping symptoms some continue the same.      Relevant Orders   CBC   TSH   Comprehensive metabolic panel   Lipid panel   Hyperlipidemia   Relevant Orders   CBC   TSH   Comprehensive  metabolic panel   Lipid panel   Iron deficiency anemia   Relevant Orders   CBC   TSH   Comprehensive metabolic panel   Lipid panel   Multiple thyroid nodules, dominant nodule left lobe 1.8 cm    Referred to general surgery for likely biopsy due to increasing size of nodule      Relevant Orders   Ambulatory referral to General Surgery   CBC   TSH   Comprehensive metabolic panel   Lipid panel   Obesity    Encouraged DASH diet, decrease po intake and increase exercise as tolerated. Needs 7-8 hours of sleep nightly. Avoid trans fats, eat small, frequent meals every 4-5 hours with lean proteins, complex carbs and healthy fats. Minimize simple carbs, GMO foods.      RESOLVED: Thyroid nodule   Relevant Orders   CBC   TSH   Comprehensive metabolic panel   Lipid panel    Other Visit Diagnoses    Anemia, unspecified anemia type        Relevant Medications    diazepam (VALIUM) 10 MG tablet    Other Relevant Orders    CBC    TSH    Comprehensive metabolic panel    Lipid panel    Hyperglycemia        Relevant Medications    diazepam (VALIUM) 10 MG tablet    Other Relevant Orders    CBC    TSH    Comprehensive metabolic panel    Lipid panel       I am having Ms. Dey maintain her pantoprazole  and diazepam.  Meds ordered this encounter  Medications  . diazepam (VALIUM) 10 MG tablet    Sig: Take 1 tablet (10 mg total) by mouth every 12 (twelve) hours as needed for anxiety or sleep.    Dispense:  40 tablet    Refill:  3     Penni Homans, MD

## 2015-09-11 ENCOUNTER — Encounter: Payer: Self-pay | Admitting: Family Medicine

## 2015-09-14 MED FILL — PANTOPRAZOLE SOD DR 40 MG T: 40 | 30 days supply | Qty: 60 | Fill #1

## 2015-09-20 NOTE — Assessment & Plan Note (Signed)
Encouraged DASH diet, decrease po intake and increase exercise as tolerated. Needs 7-8 hours of sleep nightly. Avoid trans fats, eat small, frequent meals every 4-5 hours with lean proteins, complex carbs and healthy fats. Minimize simple carbs, GMO foods. 

## 2015-09-20 NOTE — Assessment & Plan Note (Signed)
Endoscopy has confirmed gastritis, Avoid offending foods, start probiotics. Do not eat large meals in late evening and consider raising head of bed. Pantoprazole is helping symptoms some continue the same.

## 2015-09-20 NOTE — Assessment & Plan Note (Signed)
Referred to general surgery for likely biopsy due to increasing size of nodule

## 2015-09-22 ENCOUNTER — Ambulatory Visit: Payer: BLUE CROSS/BLUE SHIELD | Admitting: Physician Assistant

## 2015-09-22 ENCOUNTER — Telehealth: Payer: Self-pay | Admitting: Physician Assistant

## 2015-09-24 ENCOUNTER — Encounter: Payer: Self-pay | Admitting: Physician Assistant

## 2015-09-24 NOTE — Telephone Encounter (Signed)
Pt was no show 09/22/15 for acute appt, pt has not rescheduled, 1st no show I see, charge or no charge?

## 2015-09-24 NOTE — Telephone Encounter (Signed)
Mailing letter notifying one time waive on no show

## 2015-09-24 NOTE — Telephone Encounter (Signed)
No charge for 1st no-show 

## 2015-10-23 MED FILL — PANTOPRAZOLE SOD DR 40 MG T: 40 | 30 days supply | Qty: 60 | Fill #2

## 2015-10-28 ENCOUNTER — Ambulatory Visit (INDEPENDENT_AMBULATORY_CARE_PROVIDER_SITE_OTHER): Payer: BLUE CROSS/BLUE SHIELD | Admitting: Physician Assistant

## 2015-10-28 ENCOUNTER — Encounter: Payer: Self-pay | Admitting: Physician Assistant

## 2015-10-28 VITALS — BP 108/78 | HR 89 | Temp 97.9°F | Ht 68.0 in | Wt 304.8 lb

## 2015-10-28 DIAGNOSIS — F32A Depression, unspecified: Secondary | ICD-10-CM

## 2015-10-28 DIAGNOSIS — B9689 Other specified bacterial agents as the cause of diseases classified elsewhere: Secondary | ICD-10-CM

## 2015-10-28 DIAGNOSIS — F418 Other specified anxiety disorders: Secondary | ICD-10-CM

## 2015-10-28 DIAGNOSIS — J019 Acute sinusitis, unspecified: Secondary | ICD-10-CM

## 2015-10-28 DIAGNOSIS — F329 Major depressive disorder, single episode, unspecified: Secondary | ICD-10-CM

## 2015-10-28 DIAGNOSIS — J309 Allergic rhinitis, unspecified: Secondary | ICD-10-CM | POA: Diagnosis not present

## 2015-10-28 DIAGNOSIS — F419 Anxiety disorder, unspecified: Secondary | ICD-10-CM

## 2015-10-28 MED ORDER — VENLAFAXINE HCL ER 37.5 MG PO TB24: ORAL_TABLET | ORAL | Status: DC

## 2015-10-28 MED ORDER — AMOXICILLIN-POT CLAVULANATE 875-125 MG PO TABS: 1.0000 | ORAL_TABLET | Freq: Two times a day (BID) | ORAL | Status: DC

## 2015-10-28 MED ORDER — AZELASTINE HCL 0.1 % NA SOLN: 2.0000 | Freq: Two times a day (BID) | NASAL | Status: DC

## 2015-10-28 MED ORDER — LEVOCETIRIZINE DIHYDROCHLORIDE 5 MG PO TABS: 5.0000 mg | ORAL_TABLET | Freq: Every evening | ORAL | Status: DC

## 2015-10-28 MED ORDER — FLUCONAZOLE 150 MG PO TABS: 150.0000 mg | ORAL_TABLET | Freq: Every day | ORAL | Status: DC

## 2015-10-28 MED FILL — diazePAM 10 MG TABS: 10 | 20 days supply | Qty: 40 | Fill #0

## 2015-10-28 MED FILL — FLUCONAZOLE 150 MG TABLET: 150 | 1 days supply | Qty: 1 | Fill #0

## 2015-10-28 MED FILL — LEVOCETIRIZINE 5 MG TABLET: 5 | 30 days supply | Qty: 30 | Fill #0

## 2015-10-28 MED FILL — AZELASTINE 0.1% (137 MCG) S: 0.1 | 30 days supply | Qty: 30 | Fill #0

## 2015-10-28 MED FILL — AMOX-CLAV 875-125 MG TABLET: 875-125 | 7 days supply | Qty: 14 | Fill #0

## 2015-10-28 MED FILL — VENLAFAXINE HCL 37.5 MG TAB: 37.5 | 30 days supply | Qty: 30 | Fill #0

## 2015-10-28 NOTE — Patient Instructions (Signed)
Please go downstairs to pick up medications.  I have sent in a prescription for the Effexor. Your insurance is showing that they now require a Prior Authorization before they will pay for medication. The pharmacy staff downstairs will fax [place] a PA form if needed.  Please take antibiotic as directed.  Increase fluid intake.  Use Saline nasal spray.  Take a daily multivitamin. Start the Xyzal and Astelin nasal sprays as directed.  Place a humidifier in the bedroom.  Please call or return clinic if symptoms are not improving.   Remember to keep the dog out of the bedroom!  Follow-up with Dr. Charlett Blake 3-4 weeks after starting the Effexor  Sinusitis Sinusitis is redness, soreness, and swelling (inflammation) of the paranasal sinuses. Paranasal sinuses are air pockets within the bones of your face (beneath the eyes, the middle of the forehead, or above the eyes). In healthy paranasal sinuses, mucus is able to drain out, and air is able to circulate through them by way of your nose. However, when your paranasal sinuses are inflamed, mucus and air can become trapped. This can allow bacteria and other germs to grow and cause infection. Sinusitis can develop quickly and last only a short time (acute) or continue over a long period (chronic). Sinusitis that lasts for more than 12 weeks is considered chronic.  CAUSES  Causes of sinusitis include:  Allergies.  Structural abnormalities, such as displacement of the cartilage that separates your nostrils (deviated septum), which can decrease the air flow through your nose and sinuses and affect sinus drainage.  Functional abnormalities, such as when the small hairs (cilia) that line your sinuses and help remove mucus do not work properly or are not present. SYMPTOMS  Symptoms of acute and chronic sinusitis are the same. The primary symptoms are pain and pressure around the affected sinuses. Other symptoms include:  Upper  toothache.  Earache.  Headache.  Bad breath.  Decreased sense of smell and taste.  A cough, which worsens when you are lying flat.  Fatigue.  Fever.  Thick drainage from your nose, which often is green and may contain pus (purulent).  Swelling and warmth over the affected sinuses. DIAGNOSIS  Your caregiver will perform a physical exam. During the exam, your caregiver may:  Look in your nose for signs of abnormal growths in your nostrils (nasal polyps).  Tap over the affected sinus to check for signs of infection.  View the inside of your sinuses (endoscopy) with a special imaging device with a light attached (endoscope), which is inserted into your sinuses. If your caregiver suspects that you have chronic sinusitis, one or more of the following tests may be recommended:  Allergy tests.  Nasal culture A sample of mucus is taken from your nose and sent to a lab and screened for bacteria.  Nasal cytology A sample of mucus is taken from your nose and examined by your caregiver to determine if your sinusitis is related to an allergy. TREATMENT  Most cases of acute sinusitis are related to a viral infection and will resolve on their own within 10 days. Sometimes medicines are prescribed to help relieve symptoms (pain medicine, decongestants, nasal steroid sprays, or saline sprays).  However, for sinusitis related to a bacterial infection, your caregiver will prescribe antibiotic medicines. These are medicines that will help kill the bacteria causing the infection.  Rarely, sinusitis is caused by a fungal infection. In theses cases, your caregiver will prescribe antifungal medicine. For some cases of chronic sinusitis, surgery  is needed. Generally, these are cases in which sinusitis recurs more than 3 times per year, despite other treatments. HOME CARE INSTRUCTIONS   Drink plenty of water. Water helps thin the mucus so your sinuses can drain more easily.  Use a  humidifier.  Inhale steam 3 to 4 times a day (for example, sit in the bathroom with the shower running).  Apply a warm, moist washcloth to your face 3 to 4 times a day, or as directed by your caregiver.  Use saline nasal sprays to help moisten and clean your sinuses.  Take over-the-counter or prescription medicines for pain, discomfort, or fever only as directed by your caregiver. SEEK IMMEDIATE MEDICAL CARE IF:  You have increasing pain or severe headaches.  You have nausea, vomiting, or drowsiness.  You have swelling around your face.  You have vision problems.  You have a stiff neck.  You have difficulty breathing. MAKE SURE YOU:   Understand these instructions.  Will watch your condition.  Will get help right away if you are not doing well or get worse. Document Released: 07/04/2005 Document Revised: 09/26/2011 Document Reviewed: 07/19/2011 [facility] Patient Information 2014 [place], [place].

## 2015-10-28 NOTE — Progress Notes (Signed)
Patient presents to clinic today c/o sinus pressure, sinus pain, ear pain and fatigue over the past few weeks. Endorses tooth pain with symptoms. Also endorses over the past few months having nasal congestion, rhinorrhea and watery eyes after moving in with boyfriend who has a dog.   Patient also endorses worsening mood over the past couple of months due to multiple stressors at home and work. Endorses depressed mood and anhedonia. Denies SI/HI. Has been on multiple medications previously. Endorses doing very well on Effexor previously.   Past Medical History  Diagnosis Date  . Fibromyalgia   . Obesity   . Melanoma ([place])     right hip and knee  . Chicken pox   . Allergy   . Migraine   . UTI (lower urinary tract infection)   . GERD (gastroesophageal reflux disease)   . Anxiety disorder   . Anemia     in the past  . Depression     in teh past- ok now  . Shortness of breath     when anemic  . Complication of anesthesia 1999    urinary retention  . Neck pain on right side 12/23/2012  . Left thyroid nodule 03/31/2013  . Pain 03/31/2013  . TIA (transient ischemic attack) 03/31/2013  . Palpitations 08/10/2014  . Hyperlipidemia 08/07/2015  . Sinusitis 08/07/2015  . Anxiety     Current Outpatient Prescriptions on File Prior to Visit  Medication Sig Dispense Refill  . diazepam (VALIUM) 10 MG tablet Take 1 tablet (10 mg total) by mouth every 12 (twelve) hours as needed for anxiety or sleep. 40 tablet 3  . pantoprazole (PROTONIX) 40 MG tablet Take 1 tablet (40 mg total) by mouth 2 (two) times daily. 60 tablet 3   No current facility-administered medications on file prior to visit.    Allergies  Allergen Reactions  . Adhesive [Tape] Dermatitis    Band- Aid adhesive  . Aleve [Naproxen Sodium]     Makes hands, legs, and feet swell  . Carbamazepine     Hallucinations and dizziness    Family History  Problem Relation Age of Onset  . Colon cancer Neg Hx   . Colitis Father    crohn's  . Heart disease Father     grandfather  . Heart attack Father 39  . Arthritis Father     s/p back surgery  . Diabetes      grandfather  . Irritable bowel syndrome    . Kidney disease      grandfather  . Arthritis Other   . Cancer Other     breast  . Hyperlipidemia Other   . Mental illness Other   . Other Mother     neurological autoimmune disease, chorea in trunk  . Emphysema Maternal Uncle   . Arthritis Maternal Grandmother   . Lupus Maternal Grandmother   . Dementia Maternal Grandmother   . CVA Maternal Grandmother   . Arthritis Paternal Grandmother   . Heart disease Paternal Grandmother     chf  . Diabetes Paternal Grandfather   . Kidney disease Paternal Grandfather   . Hypertension Paternal Grandfather   . Hyperlipidemia Paternal Grandfather   . Cancer Maternal Grandfather     bone    Social History   Social History  . Marital Status: Divorced    Spouse Name: N/A  . Number of Children: N/A  . Years of Education: N/A   Occupational History  . unemployed    Social History Main  Topics  . Smoking status: Never Smoker   . Smokeless tobacco: Never Used     Comment: never used tobacco  . Alcohol Use: No  . Drug Use: No  . Sexual Activity: Not Currently    Birth Control/ Protection: None   Other Topics Concern  . None   Social History Narrative   Daily caffeine    Review of Systems - See HPI.  All other ROS are negative.  BP 108/78 mmHg  Pulse 89  Temp(Src) 97.9 F (36.6 C) (Oral)  Ht 5\' 8"  (1.727 m)  Wt 304 lb 12.8 oz (138.256 kg)  BMI 46.36 kg/m2  SpO2 98%  LMP 08/13/2012  Physical Exam  Constitutional: She is oriented to person, place, and time and well-developed, well-nourished, and in no distress.  HENT:  Head: Normocephalic and atraumatic.  Right Ear: Tympanic membrane normal.  Left Ear: Tympanic membrane normal.  Nose: No nasal deformity. Right sinus exhibits frontal sinus tenderness. Left sinus exhibits frontal sinus  tenderness.  Mouth/Throat: Uvula is midline, oropharynx is clear and moist and mucous membranes are normal.  Eyes: Conjunctivae are normal.  Neck: Neck supple.  Cardiovascular: Normal rate, regular rhythm, normal heart sounds and intact distal pulses.   Pulmonary/Chest: Effort normal and breath sounds normal. No respiratory distress. She has no wheezes. She has no rales. She exhibits no tenderness.  Lymphadenopathy:    She has no cervical adenopathy.  Neurological: She is alert and oriented to person, place, and time.  Skin: Skin is warm and dry. No rash noted.  Vitals reviewed.   Recent Results (from the past 2160 hour(s))  CBC w/Diff     Status: None   Collection Time: 08/10/15  8:17 AM  Result Value Ref Range   WBC 6.9 3.9 - 10.0 10e3/uL   RBC 4.62 3.70 - 5.32 10e6/uL   HGB 14.5 11.6 - 15.9 g/dL   HCT 42.2 34.8 - 46.6 %   MCV 91 81 - 101 fL   MCH 31.4 26.0 - 34.0 pg   MCHC 34.4 32.0 - 36.0 g/dL   RDW 12.7 11.1 - 15.7 %   Platelets 269 145 - 400 10e3/uL   NEUT# 4.9 1.5 - 6.5 10e3/uL   LYMPH# 1.4 0.9 - 3.3 10e3/uL   MONO# 0.4 0.1 - 0.9 10e3/uL   Eosinophils Absolute 0.1 0.0 - 0.5 10e3/uL   BASO# 0.0 0.0 - 0.2 10e3/uL   NEUT% 71.1 39.6 - 80.0 %   LYMPH% 20.7 14.0 - 48.0 %   MONO% 6.2 0.0 - 13.0 %   EOS% 1.4 0.0 - 7.0 %   BASO% 0.6 0.0 - 2.0 %  Smear     Status: None   Collection Time: 08/10/15  8:17 AM  Result Value Ref Range   Smear Result Smear Available   Iron and TIBC     Status: Abnormal   Collection Time: 08/10/15  8:17 AM  Result Value Ref Range   Iron 98 41 - 142 ug/dL   TIBC 215 (L) 236 - 444 ug/dL   UIBC 118 (L) 120 - 384 ug/dL   %SAT 45 21 - 57 %  Reticulocytes     Status: None   Collection Time: 08/10/15  8:17 AM  Result Value Ref Range   Reticulocyte Count 1.5 0.6 - 2.6 %    Assessment/Plan: Anxiety and depression Will restart Effexor at 37.5 mg XR capsule daily. Follow-up 1 month.   Acute bacterial sinusitis Rx Augmentin.  Increase fluids.   Rest.  Saline nasal spray.  Probiotic.  Mucinex as directed.  Humidifier in bedroom. Allergy medications reviewed.  Call or return to clinic if symptoms are not improving.

## 2015-10-28 NOTE — Progress Notes (Signed)
Pre visit review using our clinic review tool, if applicable. No additional management support is needed unless otherwise documented below in the visit note. 

## 2015-10-30 DIAGNOSIS — B9689 Other specified bacterial agents as the cause of diseases classified elsewhere: Secondary | ICD-10-CM | POA: Insufficient documentation

## 2015-10-30 DIAGNOSIS — J019 Acute sinusitis, unspecified: Principal | ICD-10-CM

## 2015-10-30 NOTE — Assessment & Plan Note (Signed)
Rx Augmentin.  Increase fluids.  Rest.  Saline nasal spray.  Probiotic.  Mucinex as directed.  Humidifier in bedroom. Allergy medications reviewed.  Call or return to clinic if symptoms are not improving.

## 2015-10-30 NOTE — Assessment & Plan Note (Signed)
Will restart Effexor at 37.5 mg XR capsule daily. Follow-up 1 month.

## 2015-11-25 MED FILL — PANTOPRAZOLE SOD DR 40 MG T: 40 | 30 days supply | Qty: 60 | Fill #3

## 2015-11-25 MED FILL — LEVOCETIRIZINE 5 MG TABLET: 5 | 30 days supply | Qty: 30 | Fill #1

## 2015-12-01 ENCOUNTER — Ambulatory Visit (INDEPENDENT_AMBULATORY_CARE_PROVIDER_SITE_OTHER): Payer: BLUE CROSS/BLUE SHIELD | Admitting: Family Medicine

## 2015-12-01 ENCOUNTER — Encounter: Payer: Self-pay | Admitting: Family Medicine

## 2015-12-01 VITALS — BP 120/82 | HR 95 | Temp 98.8°F | Ht 68.0 in | Wt 306.5 lb

## 2015-12-01 DIAGNOSIS — K219 Gastro-esophageal reflux disease without esophagitis: Secondary | ICD-10-CM | POA: Diagnosis not present

## 2015-12-01 DIAGNOSIS — J329 Chronic sinusitis, unspecified: Secondary | ICD-10-CM | POA: Diagnosis not present

## 2015-12-01 DIAGNOSIS — L988 Other specified disorders of the skin and subcutaneous tissue: Secondary | ICD-10-CM

## 2015-12-01 DIAGNOSIS — E669 Obesity, unspecified: Secondary | ICD-10-CM

## 2015-12-01 DIAGNOSIS — J029 Acute pharyngitis, unspecified: Secondary | ICD-10-CM

## 2015-12-01 DIAGNOSIS — E785 Hyperlipidemia, unspecified: Secondary | ICD-10-CM

## 2015-12-01 DIAGNOSIS — N649 Disorder of breast, unspecified: Secondary | ICD-10-CM

## 2015-12-01 MED ORDER — CEFDINIR 300 MG PO CAPS: 300.0000 mg | ORAL_CAPSULE | Freq: Two times a day (BID) | ORAL | Status: DC

## 2015-12-01 NOTE — Progress Notes (Signed)
Pre visit review using our clinic review tool, if applicable. No additional management support is needed unless otherwise documented below in the visit note. 

## 2015-12-01 NOTE — Patient Instructions (Addendum)
Take mucinex twice daily. Fibrocystic Breast Changes Fibrocystic breast changes occur when breast ducts become blocked, causing painful, fluid-filled lumps (cysts) to form in the breast. This is a common condition that is noncancerous (benign). It occurs when women go through hormonal changes during their menstrual cycle. Fibrocystic breast changes can affect one or both breasts. CAUSES  The exact cause of fibrocystic breast changes is not known, but it may be related to the female hormones estrogen and progesterone. Family traits that get passed from parent to child (genetics) may also be a factor in some cases. SIGNS AND SYMPTOMS   Tenderness, mild discomfort, or pain.   Swelling.   Rope-like feeling when touching the breast.   Lumpy breast, one or both sides.   Changes in breast size, especially before (larger) and after (smaller) the menstrual period.   Green or dark brown nipple discharge (not blood).  Symptoms are usually worse before menstrual periods start and get better toward the end of the menstrual period.  DIAGNOSIS  To make a diagnosis, your health care provider will ask you questions and perform a physical exam of your breasts. The health care provider may recommend other tests that can examine inside your breasts, such as:  A breast X-ray (mammogram).   Ultrasonography.  An MRI.  If something more than fibrocystic breast changes is suspected, your health care provider may take a breast tissue sample (breast biopsy) to examine. TREATMENT  Often, treatment is not needed. Your health care provider may recommend over-the-counter pain relievers to help lessen pain or discomfort caused by the fibrocystic breast changes. You may also be asked to change your diet to limit or stop eating foods or drinking beverages that contain caffeine. Foods and beverages that contain caffeine include chocolate, soda, coffee, and tea. Reducing sugar and fat in your diet may also help.  Your health care provider may also recommend:  Fine needle aspiration to remove fluid from a cyst that is causing pain.   Surgery to remove a large, persistent, and tender cyst. HOME CARE INSTRUCTIONS   Examine your breasts after every menstrual period. If you do not have menstrual periods, check your breasts the first day of every month. Feel for changes, such as more tenderness, a new growth, a change in breast size, or a change in a lump that has always been there.   Only take over-the-counter or prescription medicine as directed by your health care provider.   Wear a well-fitted support or sports bra, especially when exercising.   Decrease or avoid caffeine, fat, and sugar in your diet as directed by your health care provider.  SEEK MEDICAL CARE IF:   You have fluid leaking (discharge) from your nipples, especially bloody discharge.   You have new lumps or bumps in the breast.   Your breast or breasts become enlarged, red, and painful.   You have areas of your breast that pucker in.   Your nipples appear flat or indented.    This information is not intended to replace advice given to you by your health care provider. Make sure you discuss any questions you have with your health care provider.   Document Released: 04/20/2006 Document Revised: 03/25/2015 Document Reviewed: 12/23/2012 Elsevier Interactive Patient Education [employer].

## 2015-12-01 NOTE — Progress Notes (Signed)
Subjective:    Patient ID: [name], female    DOB: 08/17/1970, 45 y.o.   MRN: [id number]  Chief Complaint  Patient presents with  . Breast Pain    Lump right  . Sore Throat    HPI Patient is in today for breast lump, that has been tender but is improving today. Patient also has some concerns of sore throat and white patches that feels as if its burning, has been having some chills, and dizziness.  Denies CP/palp/SOB/HA/GI or GU c/o. Taking meds as prescribed  Past Medical History  Diagnosis Date  . Fibromyalgia   . Obesity   . Melanoma ([place])     right hip and knee  . Chicken pox   . Allergy   . Migraine   . UTI (lower urinary tract infection)   . GERD (gastroesophageal reflux disease)   . Anxiety disorder   . Anemia     in the past  . Depression     in teh past- ok now  . Shortness of breath     when anemic  . Complication of anesthesia 1999    urinary retention  . Neck pain on right side 12/23/2012  . Left thyroid nodule 03/31/2013  . Pain 03/31/2013  . TIA (transient ischemic attack) 03/31/2013  . Palpitations 08/10/2014  . Hyperlipidemia 08/07/2015  . Sinusitis 08/07/2015  . Anxiety   . Skin lesion of breast 12/06/2015  . Breast lesion 12/06/2015    Past Surgical History  Procedure Laterality Date  . Back surgery    . Cholecystectomy    . Wisdom tooth extraction  1989  . Melanoma excision  2011    stage I and II  . Laparoscopic assisted vaginal hysterectomy N/A 09/13/2012    Procedure: LAPAROSCOPIC ASSISTED VAGINAL HYSTERECTOMY;  Surgeon: Lavonia Drafts, MD;  Location: [facility];  Service: Gynecology;  Laterality: N/A;  . Bilateral salpingectomy Bilateral 09/13/2012    Procedure: BILATERAL SALPINGECTOMY;  Surgeon: Lavonia Drafts, MD;  Location: [facility];  Service: Gynecology;  Laterality: Bilateral;  . Abdominal hysterectomy    . Colonoscopy      Family History  Problem Relation Age of Onset  . Colon cancer Neg Hx   . Colitis Father    crohn's  . Heart disease Father     grandfather  . Heart attack Father 2  . Arthritis Father     s/p back surgery  . Diabetes      grandfather  . Irritable bowel syndrome    . Kidney disease      grandfather  . Arthritis Other   . Cancer Other     breast  . Hyperlipidemia Other   . Mental illness Other   . Other Mother     neurological autoimmune disease, chorea in trunk  . Emphysema Maternal Uncle   . Arthritis Maternal Grandmother   . Lupus Maternal Grandmother   . Dementia Maternal Grandmother   . CVA Maternal Grandmother   . Arthritis Paternal Grandmother   . Heart disease Paternal Grandmother     chf  . Diabetes Paternal Grandfather   . Kidney disease Paternal Grandfather   . Hypertension Paternal Grandfather   . Hyperlipidemia Paternal Grandfather   . Cancer Maternal Grandfather     bone    Social History   Social History  . Marital Status: Divorced    Spouse Name: N/A  . Number of Children: N/A  . Years of Education: N/A   Occupational History  .  unemployed    Social History Main Topics  . Smoking status: Never Smoker   . Smokeless tobacco: Never Used     Comment: never used tobacco  . Alcohol Use: No  . Drug Use: No  . Sexual Activity: Not Currently    Birth Control/ Protection: None   Other Topics Concern  . Not on file   Social History Narrative   Daily caffeine    Outpatient Prescriptions Prior to Visit  Medication Sig Dispense Refill  . azelastine (ASTELIN) 0.1 % nasal spray Place 2 sprays into both nostrils 2 (two) times daily. Use in each nostril as directed 30 mL 12  . diazepam (VALIUM) 10 MG tablet Take 1 tablet (10 mg total) by mouth every 12 (twelve) hours as needed for anxiety or sleep. 40 tablet 3  . levocetirizine (XYZAL) 5 MG tablet Take 1 tablet (5 mg total) by mouth every evening. 30 tablet 1  . pantoprazole (PROTONIX) 40 MG tablet Take 1 tablet (40 mg total) by mouth 2 (two) times daily. 60 tablet 3  . Venlafaxine HCl 37.5  MG TB24 1 tablet by mouth daily 30 each 1  . amoxicillin-clavulanate (AUGMENTIN) 875-125 MG tablet Take 1 tablet by mouth 2 (two) times daily. 14 tablet 0  . fluconazole (DIFLUCAN) 150 MG tablet Take 1 tablet (150 mg total) by mouth daily. 1 tablet 0   No facility-administered medications prior to visit.    Allergies  Allergen Reactions  . Adhesive [Tape] Dermatitis    Band- Aid adhesive  . Aleve [Naproxen Sodium]     Makes hands, legs, and feet swell  . Carbamazepine     Hallucinations and dizziness    Review of Systems  Constitutional: Positive for fever and chills. Negative for malaise/fatigue.  HENT: Positive for sore throat. Negative for congestion.   Eyes: Negative for blurred vision.  Respiratory: Negative for shortness of breath.   Cardiovascular: Negative for chest pain, palpitations and leg swelling.  Gastrointestinal: Negative for nausea, abdominal pain and blood in stool.  Genitourinary: Negative for dysuria and frequency.  Musculoskeletal: Negative for falls.  Skin: Negative for rash.  Neurological: Negative for dizziness, loss of consciousness and headaches.  Endo/Heme/Allergies: Negative for environmental allergies.  Psychiatric/Behavioral: Negative for depression. The patient is not nervous/anxious.        Objective:    Physical Exam  Constitutional: She is oriented to person, place, and time. She appears well-developed and well-nourished. No distress.  HENT:  Head: Normocephalic and atraumatic.  Eyes: Conjunctivae are normal.  Neck: Neck supple. No thyromegaly present.  Cardiovascular: Normal rate, regular rhythm and normal heart sounds.   No murmur heard. Pulmonary/Chest: Effort normal and breath sounds normal. No respiratory distress.  Abdominal: Soft. Bowel sounds are normal. She exhibits no distension and no mass. There is no tenderness.  Musculoskeletal: She exhibits no edema.  Lymphadenopathy:    She has no cervical adenopathy.  Neurological: She  is alert and oriented to person, place, and time.  Skin: Skin is warm and dry.  Psychiatric: She has a normal mood and affect. Her behavior is normal.    BP 120/82 mmHg  Pulse 95  Temp(Src) 98.8 F (37.1 C) (Oral)  Ht 5\' 8"  (1.727 m)  Wt 306 lb 8 oz (139.027 kg)  BMI 46.61 kg/m2  SpO2 98%  LMP 08/13/2012 Wt Readings from Last 3 Encounters:  12/01/15 306 lb 8 oz (139.027 kg)  10/28/15 304 lb 12.8 oz (138.256 kg)  09/08/15 301 lb 4 oz (136.646  kg)     Lab Results  Component Value Date   WBC 6.9 08/10/2015   HGB 14.5 08/10/2015   HCT 42.2 08/10/2015   PLT 269 08/10/2015   GLUCOSE 84 09/26/2013   CHOL 219* 07/28/2014   TRIG 194* 09/26/2013   HDL 38* 09/26/2013   LDLCALC 114* 09/26/2013   ALT 11 09/26/2013   AST 10 09/26/2013   NA 138 09/26/2013   K 4.2 09/26/2013   CL 106 09/26/2013   CREATININE 0.71 09/26/2013   BUN 12 09/26/2013   CO2 23 09/26/2013   TSH 1.56 07/31/2014   INR 0.98 09/03/2010   HGBA1C 5.4 12/10/2010    Lab Results  Component Value Date   TSH 1.56 07/31/2014   Lab Results  Component Value Date   WBC 6.9 08/10/2015   HGB 14.5 08/10/2015   HCT 42.2 08/10/2015   MCV 91 08/10/2015   PLT 269 08/10/2015   Lab Results  Component Value Date   NA 138 09/26/2013   K 4.2 09/26/2013   CO2 23 09/26/2013   GLUCOSE 84 09/26/2013   BUN 12 09/26/2013   CREATININE 0.71 09/26/2013   BILITOT 0.3 09/26/2013   ALKPHOS 77 09/26/2013   AST 10 09/26/2013   ALT 11 09/26/2013   PROT 6.6 09/26/2013   ALBUMIN 3.9 09/26/2013   CALCIUM 9.1 09/26/2013   Lab Results  Component Value Date   CHOL 219* 07/28/2014   Lab Results  Component Value Date   HDL 38* 09/26/2013   Lab Results  Component Value Date   LDLCALC 114* 09/26/2013   Lab Results  Component Value Date   TRIG 194* 09/26/2013   Lab Results  Component Value Date   CHOLHDL 5.0 09/26/2013   Lab Results  Component Value Date   HGBA1C 5.4 12/10/2010       Assessment & Plan:    Problem List Items Addressed This Visit    Skin lesion of breast   Sinusitis - Primary   Relevant Medications   cefdinir (OMNICEF) 300 MG capsule   Other Relevant Orders   Ambulatory referral to ENT   Pharyngitis    Started on antibiotics and encouraged increased rest and hydration, add probiotics, zinc such as Coldeze or Xicam. Treat fevers as needed      Relevant Medications   cefdinir (OMNICEF) 300 MG capsule   Other Relevant Orders   Ambulatory referral to ENT   Obesity    Encouraged DASH diet, decrease po intake and increase exercise as tolerated. Needs 7-8 hours of sleep nightly. Avoid trans fats, eat small, frequent meals every 4-5 hours with lean proteins, complex carbs and healthy fats. Minimize simple carbs      Hyperlipidemia    Encouraged heart healthy diet, increase exercise, avoid trans fats, consider a krill oil cap daily      GERD    Avoid offending foods, start probiotics. Do not eat large meals in late evening and consider raising head of bed.       Breast lesion    Right breast, she has noted it over the past week. Improved today. Minimize caffeine and monitor likely fibrocystic disease. Report worsening symptoms         I have discontinued Ms. Boughner's fluconazole, amoxicillin-clavulanate, and Venlafaxine HCl. I am also having her maintain her pantoprazole, diazepam, azelastine, levocetirizine, and cefdinir.  Meds ordered this encounter  Medications  . DISCONTD: cefdinir (OMNICEF) 300 MG capsule    Sig: Take 1 capsule (300 mg total) by mouth  2 (two) times daily.    Dispense:  20 capsule    Refill:  0  . cefdinir (OMNICEF) 300 MG capsule    Sig: Take 1 capsule (300 mg total) by mouth 2 (two) times daily.    Dispense:  20 capsule    Refill:  0     Penni Homans, MD

## 2015-12-02 ENCOUNTER — Encounter: Payer: Self-pay | Admitting: Family Medicine

## 2015-12-03 ENCOUNTER — Other Ambulatory Visit: Payer: Self-pay | Admitting: Family Medicine

## 2015-12-03 MED ORDER — FLUCONAZOLE 150 MG PO TABS: ORAL_TABLET | ORAL | Status: DC

## 2015-12-03 MED ORDER — HYDROCODONE-HOMATROPINE 5-1.5 MG/5ML PO SYRP: ORAL_SOLUTION | ORAL | Status: DC

## 2015-12-06 ENCOUNTER — Encounter: Payer: Self-pay | Admitting: Family Medicine

## 2015-12-06 DIAGNOSIS — J029 Acute pharyngitis, unspecified: Secondary | ICD-10-CM | POA: Insufficient documentation

## 2015-12-06 DIAGNOSIS — L988 Other specified disorders of the skin and subcutaneous tissue: Secondary | ICD-10-CM

## 2015-12-06 DIAGNOSIS — N649 Disorder of breast, unspecified: Secondary | ICD-10-CM

## 2015-12-06 HISTORY — DX: Other specified disorders of the skin and subcutaneous tissue: L98.8

## 2015-12-06 HISTORY — DX: Disorder of breast, unspecified: N64.9

## 2015-12-06 NOTE — Assessment & Plan Note (Signed)
Started on antibiotics and encouraged increased rest and hydration, add probiotics, zinc such as Coldeze or Xicam. Treat fevers as needed

## 2015-12-06 NOTE — Assessment & Plan Note (Signed)
Right breast, she has noted it over the past week. Improved today. Minimize caffeine and monitor likely fibrocystic disease. Report worsening symptoms

## 2015-12-06 NOTE — Assessment & Plan Note (Signed)
Encouraged heart healthy diet, increase exercise, avoid trans fats, consider a krill oil cap daily 

## 2015-12-06 NOTE — Assessment & Plan Note (Signed)
Encouraged DASH diet, decrease po intake and increase exercise as tolerated. Needs 7-8 hours of sleep nightly. Avoid trans fats, eat small, frequent meals every 4-5 hours with lean proteins, complex carbs and healthy fats. Minimize simple carbs 

## 2015-12-06 NOTE — Assessment & Plan Note (Signed)
Avoid offending foods, start probiotics. Do not eat large meals in late evening and consider raising head of bed.  

## 2015-12-09 ENCOUNTER — Ambulatory Visit: Payer: BLUE CROSS/BLUE SHIELD | Admitting: Hematology & Oncology

## 2015-12-09 ENCOUNTER — Other Ambulatory Visit: Payer: BLUE CROSS/BLUE SHIELD

## 2015-12-09 ENCOUNTER — Ambulatory Visit: Payer: Self-pay | Admitting: Physician Assistant

## 2015-12-15 ENCOUNTER — Encounter: Payer: Self-pay | Admitting: Family Medicine

## 2015-12-21 ENCOUNTER — Encounter: Payer: Self-pay | Admitting: Family Medicine

## 2015-12-22 ENCOUNTER — Other Ambulatory Visit: Payer: Self-pay | Admitting: Physician Assistant

## 2015-12-22 ENCOUNTER — Other Ambulatory Visit: Payer: Self-pay | Admitting: Gastroenterology

## 2015-12-22 MED FILL — PANTOPRAZOLE SOD DR 40 MG T: 40 | 30 days supply | Qty: 60 | Fill #0

## 2015-12-22 MED FILL — LEVOCETIRIZINE 5 MG TABLET: 5 | 30 days supply | Qty: 30 | Fill #0

## 2015-12-22 NOTE — Telephone Encounter (Signed)
Refill sent per [facility] refill protocol/SLS  

## 2015-12-28 MED FILL — diazePAM 10 MG TABS: 10 | 20 days supply | Qty: 40 | Fill #1

## 2015-12-31 DIAGNOSIS — J0301 Acute recurrent streptococcal tonsillitis: Secondary | ICD-10-CM | POA: Insufficient documentation

## 2015-12-31 DIAGNOSIS — J0141 Acute recurrent pansinusitis: Secondary | ICD-10-CM | POA: Insufficient documentation

## 2015-12-31 DIAGNOSIS — J302 Other seasonal allergic rhinitis: Secondary | ICD-10-CM | POA: Diagnosis not present

## 2015-12-31 HISTORY — DX: Acute recurrent streptococcal tonsillitis: J03.01

## 2016-01-06 ENCOUNTER — Encounter: Payer: Self-pay | Admitting: Physician Assistant

## 2016-01-06 ENCOUNTER — Ambulatory Visit (INDEPENDENT_AMBULATORY_CARE_PROVIDER_SITE_OTHER): Payer: BLUE CROSS/BLUE SHIELD | Admitting: Physician Assistant

## 2016-01-06 VITALS — BP 108/70 | HR 77 | Temp 98.1°F | Resp 16 | Ht 68.0 in | Wt 314.5 lb

## 2016-01-06 DIAGNOSIS — M5432 Sciatica, left side: Secondary | ICD-10-CM | POA: Diagnosis not present

## 2016-01-06 DIAGNOSIS — M719 Bursopathy, unspecified: Secondary | ICD-10-CM

## 2016-01-06 MED ORDER — HYDROCODONE-ACETAMINOPHEN 10-325 MG PO TABS: 1.0000 | ORAL_TABLET | Freq: Three times a day (TID) | ORAL | Status: DC | PRN

## 2016-01-06 MED ORDER — METHYLPREDNISOLONE ACETATE 80 MG/ML IJ SUSP
80.0000 mg | Freq: Once | INTRAMUSCULAR | Status: AC
  Administered 2016-01-06: 80 mg via INTRAMUSCULAR

## 2016-01-06 MED ORDER — CARISOPRODOL 350 MG PO TABS: 350.0000 mg | ORAL_TABLET | Freq: Four times a day (QID) | ORAL | Status: DC | PRN

## 2016-01-06 NOTE — Patient Instructions (Signed)
The injection given in office should help with your symptoms. Please take the pain medication as directed. Stop the NSAIDs as they cause your feet to swell. Stay hydrated and rest.  Avoid heavy lifting or overexertion. Apply heating pad to shoulder and inguinal region -- 10 minutes 2-3 x day. Topical Aspercreme will be beneficial.  If symptoms are not quickly improving, we may want to get imaging and consider a Sports medicine assessment and injection of the bursa in the shoulders.  Bursitis Bursitis is inflammation and irritation of a bursa, which is one of the small, fluid-filled sacs that cushion and protect the moving parts of your body. These sacs are located between bones and muscles, muscle attachments, or skin areas next to bones. A bursa protects these structures from the wear and tear that results from frequent movement. An inflamed bursa causes pain and swelling. Fluid may build up inside the sac. Bursitis is most common near joints, especially the knees, elbows, hips, and shoulders. CAUSES Bursitis can be caused by:   Injury from:  A direct blow, like falling on your knee or elbow.  Overuse of a joint (repetitive stress).  Infection. This can happen if bacteria gets into a bursa through a cut or scrape near a joint.  Diseases that cause joint inflammation, such as gout and rheumatoid arthritis. RISK FACTORS You may be at risk for bursitis if you:   Have a job or hobby that involves a lot of repetitive stress on your joints.  Have a condition that weakens your body's defense system (immune system), such as diabetes, cancer, or HIV.  Lift and reach overhead often.  Kneel or lean on hard surfaces often.  Run or walk often. SIGNS AND SYMPTOMS The most common signs and symptoms of bursitis are:  Pain that gets worse when you move the affected body part or put weight on it.  Inflammation.  Stiffness. Other signs and symptoms may  include:  Redness.  Tenderness.  Warmth.  Pain that continues after rest.  Fever and chills. This may occur in bursitis caused by infection. DIAGNOSIS Bursitis may be diagnosed by:   Medical history and physical exam.  MRI.  A procedure to drain fluid from the bursa with a needle (aspiration). The fluid may be checked for signs of infection or gout.  Blood tests to rule out other causes of inflammation. TREATMENT  Bursitis can usually be treated at home with rest, ice, compression, and elevation (RICE). For mild bursitis, RICE treatment may be all you need. Other treatments may include:  Nonsteroidal anti-inflammatory drugs (NSAIDs) to treat pain and inflammation.  Corticosteroids to fight inflammation. You may have these drugs injected into and around the area of bursitis.  Aspiration of bursitis fluid to relieve pain and improve movement.  Antibiotic medicine to treat an infected bursa.  A splint, brace, or walking aid.  Physical therapy if you continue to have pain or limited movement.  Surgery to remove a damaged or infected bursa. This may be needed if you have a very bad case of bursitis or if other treatments have not worked. HOME CARE INSTRUCTIONS   Take medicines only as directed by your health care provider.  If you were prescribed an antibiotic medicine, finish it all even if you start to feel better.  Rest the affected area as directed by your health care provider.  Keep the area elevated.  Avoid activities that make pain worse.  Apply ice to the injured area:  Place ice in a  plastic bag.  Place a towel between your skin and the bag.  Leave the ice on for 20 minutes, 2-3 times a day.  Use splints, braces, pads, or walking aids as directed by your health care provider.  Keep all follow-up visits as directed by your health care provider. This is important. PREVENTION   Wear knee pads if you kneel often.  Wear sturdy running or walking shoes  that fit you well.  Take regular breaks from repetitive activity.  Warm up by stretching before doing any strenuous activity.  Maintain a healthy weight or lose weight as recommended by your health care provider. Ask your health care provider if you need help.  Exercise regularly. Start any new physical activity gradually. SEEK MEDICAL CARE IF:   Your bursitis is not responding to treatment or home care.  You have a fever.  You have chills.   This information is not intended to replace advice given to you by your health care provider. Make sure you discuss any questions you have with your health care provider.   Document Released: 07/01/2000 Document Revised: 03/25/2015 Document Reviewed: 09/23/2013 Elsevier Interactive Patient Education 2016 [employer]  Radicular Pain Radicular pain in either the arm or leg is usually from a bulging or herniated disk in the spine. A piece of the herniated disk may press against the nerves as the nerves exit the spine. This causes pain which is felt at the tips of the nerves down the arm or leg. Other causes of radicular pain may include:  Fractures.  Heart disease.  Cancer.  An abnormal and usually degenerative state of the nervous system or nerves (neuropathy). Diagnosis may require CT or MRI scanning to determine the primary cause.  Nerves that start at the neck (nerve roots) may cause radicular pain in the outer shoulder and arm. It can spread down to the thumb and fingers. The symptoms vary depending on which nerve root has been affected. In most cases radicular pain improves with conservative treatment. Neck problems may require physical therapy, a neck collar, or cervical traction. Treatment may take many weeks, and surgery may be considered if the symptoms do not improve.  Conservative treatment is also recommended for sciatica. Sciatica causes pain to radiate from the lower back or buttock area down the leg into the foot. Often there is  a history of back problems. Most patients with sciatica are better after 2 to 4 weeks of rest and other supportive care. Short term bed rest can reduce the disk pressure considerably. Sitting, however, is not a good position since this increases the pressure on the disk. You should avoid bending, lifting, and all other activities which make the problem worse. Traction can be used in severe cases. Surgery is usually reserved for patients who do not improve within the first months of treatment. Only take over-the-counter or prescription medicines for pain, discomfort, or fever as directed by your caregiver. Narcotics and muscle relaxants may help by relieving more severe pain and spasm and by providing mild sedation. Cold or massage can give significant relief. Spinal manipulation is not recommended. It can increase the degree of disc protrusion. Epidural steroid injections are often effective treatment for radicular pain. These injections deliver medicine to the spinal nerve in the space between the protective covering of the spinal cord and back bones (vertebrae). Your caregiver can give you more information about steroid injections. These injections are most effective when given within two weeks of the onset of pain.  You  should see your caregiver for follow up care as recommended. A program for neck and back injury rehabilitation with stretching and strengthening exercises is an important part of management.  SEEK IMMEDIATE MEDICAL CARE IF:  You develop increased pain, weakness, or numbness in your arm or leg.  You develop difficulty with bladder or bowel control.  You develop abdominal pain.   This information is not intended to replace advice given to you by your health care provider. Make sure you discuss any questions you have with your health care provider.   Document Released: 08/11/2004 Document Revised: 07/25/2014 Document Reviewed: 01/28/2015 Elsevier Interactive Patient Education [employer]

## 2016-01-06 NOTE — Progress Notes (Signed)
Pre visit review using our clinic review tool, if applicable. No additional management support is needed unless otherwise documented below in the visit note/SLS  

## 2016-01-06 NOTE — Progress Notes (Signed)
Patient presents to clinic today c/o pain in left sacral region with radiation into the left lower extremity x 1 week. Denies numbness, or weakness. Notes tingling. Denies trauma or injury. Has been taking Aleve to help with symptoms but is not helping. Endorses she does not like Aleve as it makes her feet swell which she has noted over the past couple of days.   Patient also with history of fibromyalgia, endorses pain of AC joints bilaterally with tenderness to the touch. Does note the pain sometimes feels like it radiates but mainly stays put. Denies weakness of extremities. Has known history of cervical disc disease.   Past Medical History  Diagnosis Date  . Fibromyalgia   . Obesity   . Melanoma ([place])     right hip and knee  . Chicken pox   . Allergy   . Migraine   . UTI (lower urinary tract infection)   . GERD (gastroesophageal reflux disease)   . Anxiety disorder   . Anemia     in the past  . Depression     in teh past- ok now  . Shortness of breath     when anemic  . Complication of anesthesia 1999    urinary retention  . Neck pain on right side 12/23/2012  . Left thyroid nodule 03/31/2013  . Pain 03/31/2013  . TIA (transient ischemic attack) 03/31/2013  . Palpitations 08/10/2014  . Hyperlipidemia 08/07/2015  . Sinusitis 08/07/2015  . Anxiety   . Skin lesion of breast 12/06/2015  . Breast lesion 12/06/2015    Current Outpatient Prescriptions on File Prior to Visit  Medication Sig Dispense Refill  . azelastine (ASTELIN) 0.1 % nasal spray Place 2 sprays into both nostrils 2 (two) times daily. Use in each nostril as directed 30 mL 12  . diazepam (VALIUM) 10 MG tablet Take 1 tablet (10 mg total) by mouth every 12 (twelve) hours as needed for anxiety or sleep. 40 tablet 3  . levocetirizine (XYZAL) 5 MG tablet TAKE 1 TABLET BY MOUTH EVERY EVENING 30 tablet 1  . pantoprazole (PROTONIX) 40 MG tablet TAKE 1 TABLET (40 MG TOTAL) BY MOUTH 2 TIMES DAILY. 60 tablet 3   No current  facility-administered medications on file prior to visit.    Allergies  Allergen Reactions  . Adhesive [Tape] Dermatitis    Band- Aid adhesive  . Aleve [Naproxen Sodium]     Makes hands, legs, and feet swell  . Carbamazepine     Hallucinations and dizziness    Family History  Problem Relation Age of Onset  . Colon cancer Neg Hx   . Colitis Father     crohn's  . Heart disease Father     grandfather  . Heart attack Father 58  . Arthritis Father     s/p back surgery  . Diabetes      grandfather  . Irritable bowel syndrome    . Kidney disease      grandfather  . Arthritis Other   . Cancer Other     breast  . Hyperlipidemia Other   . Mental illness Other   . Other Mother     neurological autoimmune disease, chorea in trunk  . Emphysema Maternal Uncle   . Arthritis Maternal Grandmother   . Lupus Maternal Grandmother   . Dementia Maternal Grandmother   . CVA Maternal Grandmother   . Arthritis Paternal Grandmother   . Heart disease Paternal Grandmother     chf  . Diabetes  Paternal Grandfather   . Kidney disease Paternal Grandfather   . Hypertension Paternal Grandfather   . Hyperlipidemia Paternal Grandfather   . Cancer Maternal Grandfather     bone    Social History   Social History  . Marital Status: Divorced    Spouse Name: N/A  . Number of Children: N/A  . Years of Education: N/A   Occupational History  . unemployed    Social History Main Topics  . Smoking status: Never Smoker   . Smokeless tobacco: Never Used     Comment: never used tobacco  . Alcohol Use: No  . Drug Use: No  . Sexual Activity: Not Currently    Birth Control/ Protection: None   Other Topics Concern  . None   Social History Narrative   Daily caffeine    Review of Systems - See HPI.  All other ROS are negative.  Ht 5\' 8"  (1.727 m)  Wt 314 lb 8 oz (142.656 kg)  BMI 47.83 kg/m2  LMP 08/13/2012  Physical Exam  Constitutional: She is oriented to person, place, and time and  well-developed, well-nourished, and in no distress.  HENT:  Head: Normocephalic and atraumatic.  Eyes: Conjunctivae are normal.  Neck: Neck supple.  Cardiovascular: Normal rate, regular rhythm, normal heart sounds and intact distal pulses.   Pulmonary/Chest: Effort normal and breath sounds normal. No respiratory distress. She has no wheezes. She has no rales. She exhibits no tenderness.  Musculoskeletal:       Lumbar back: She exhibits pain. She exhibits no tenderness and no bony tenderness.       Arms: Neurological: She is alert and oriented to person, place, and time.  Skin: Skin is warm and dry. No rash noted.  Psychiatric: Affect normal.  Vitals reviewed.   No results found for this or any previous visit (from the past 2160 hour(s)).  Assessment/Plan: 1. Sciatica of left side IM Depomedrol given. Pain medication discussed. Rx hydrocodone. Supportive measures reviewed. Stretches reviewed. Follow-up if not resolving. - methylPREDNISolone acetate (DEPO-MEDROL) injection 80 mg; Inject 1 mL (80 mg total) into the muscle once.  2. Bursitis Steroid should help some. Pain medication as directed. Discussed potential need for bursal injection. Patient declines Sports Medicine referral at this time.   Leeanne Rio, PA-C

## 2016-01-07 ENCOUNTER — Encounter: Payer: Self-pay | Admitting: Physician Assistant

## 2016-01-07 MED ORDER — MUPIROCIN CALCIUM 2 % EX CREA: 1.0000 "application " | TOPICAL_CREAM | Freq: Two times a day (BID) | CUTANEOUS | Status: DC

## 2016-01-10 ENCOUNTER — Encounter: Payer: Self-pay | Admitting: Physician Assistant

## 2016-01-12 MED ORDER — GABAPENTIN 300 MG PO CAPS: ORAL_CAPSULE | ORAL | Status: DC

## 2016-01-12 NOTE — Telephone Encounter (Signed)
Melissa-- please advise in PCP's absence? 

## 2016-01-16 ENCOUNTER — Encounter: Payer: Self-pay | Admitting: Physician Assistant

## 2016-01-18 NOTE — Telephone Encounter (Signed)
Please arrange for patient to come in to re-evaluate her pain. May need further imaging, has not had images done in about 6 months. LMOM with contact name and number for return call RE: scheduling patient to see Dr. Charlett Blake on Friday [holding 10:15a slot for her], so she can move forward with the above statement per Dr. Reatha Armour 07/03

## 2016-01-18 NOTE — Telephone Encounter (Signed)
Please arrange for patient to come in to reevaluate her pain. May need further imaging, has not have images done in about 6 months     ----- Message -----     From: [name]     Sent: 01/16/2016  1:42 PM      To: [facility] Clinical [facility]    Subject: Visit Follow-Up Question                   Hello...thank you for calling in the Gabapentin for me. However....even after the steroid injection....the hydrocodone...the soma...and now the Gabapentin,.Marland KitchenMarland KitchenMarland KitchenIm still in pain (left leg and left shoulder)....in fact, today is my absolute worst day ever with the shoulder pain. Ive done the icy hot, the tens unit and the heating pad. I cannot raise my arm to brush/wash my hair, etc. The leg pain is a bit better, at times....the shoulder is now unbearable if moved even slightly the wrong way.     I'm wondering more and more about fibromyalgia and it's flare-ups?    Called patient to follow up with her and schedule a follow up visit. Was unable to reach patient.  Left a message for call back.

## 2016-01-22 ENCOUNTER — Ambulatory Visit (INDEPENDENT_AMBULATORY_CARE_PROVIDER_SITE_OTHER): Payer: BLUE CROSS/BLUE SHIELD | Admitting: Family Medicine

## 2016-01-22 ENCOUNTER — Ambulatory Visit (HOSPITAL_BASED_OUTPATIENT_CLINIC_OR_DEPARTMENT_OTHER)
Admission: RE | Admit: 2016-01-22 | Discharge: 2016-01-22 | Disposition: A | Discharge status: Home / Self Care | Payer: BLUE CROSS/BLUE SHIELD | Source: Ambulatory Visit | Attending: Family Medicine | Admitting: Family Medicine

## 2016-01-22 ENCOUNTER — Encounter: Payer: Self-pay | Admitting: Family Medicine

## 2016-01-22 VITALS — BP 110/76 | HR 78 | Temp 98.2°F | Ht 68.0 in | Wt 307.5 lb

## 2016-01-22 DIAGNOSIS — E785 Hyperlipidemia, unspecified: Secondary | ICD-10-CM

## 2016-01-22 DIAGNOSIS — M791 Myalgia, unspecified site: Secondary | ICD-10-CM

## 2016-01-22 DIAGNOSIS — M50322 Other cervical disc degeneration at C5-C6 level: Secondary | ICD-10-CM | POA: Diagnosis not present

## 2016-01-22 DIAGNOSIS — M25511 Pain in right shoulder: Secondary | ICD-10-CM | POA: Insufficient documentation

## 2016-01-22 DIAGNOSIS — M25512 Pain in left shoulder: Secondary | ICD-10-CM | POA: Insufficient documentation

## 2016-01-22 DIAGNOSIS — E669 Obesity, unspecified: Secondary | ICD-10-CM

## 2016-01-22 DIAGNOSIS — M2550 Pain in unspecified joint: Secondary | ICD-10-CM | POA: Diagnosis not present

## 2016-01-22 DIAGNOSIS — E559 Vitamin D deficiency, unspecified: Secondary | ICD-10-CM | POA: Diagnosis not present

## 2016-01-22 DIAGNOSIS — N951 Menopausal and female climacteric states: Secondary | ICD-10-CM | POA: Diagnosis not present

## 2016-01-22 DIAGNOSIS — M170 Bilateral primary osteoarthritis of knee: Secondary | ICD-10-CM | POA: Diagnosis not present

## 2016-01-22 DIAGNOSIS — M1712 Unilateral primary osteoarthritis, left knee: Secondary | ICD-10-CM | POA: Insufficient documentation

## 2016-01-22 DIAGNOSIS — M542 Cervicalgia: Secondary | ICD-10-CM

## 2016-01-22 DIAGNOSIS — K219 Gastro-esophageal reflux disease without esophagitis: Secondary | ICD-10-CM

## 2016-01-22 DIAGNOSIS — M50323 Other cervical disc degeneration at C6-C7 level: Secondary | ICD-10-CM | POA: Diagnosis not present

## 2016-01-22 LAB — CBC WITH DIFFERENTIAL/PLATELET
BASOS PCT: 0.5 % (ref 0.0–3.0)
Basophils Absolute: 0.1 10*3/uL (ref 0.0–0.1)
EOS ABS: 0 10*3/uL (ref 0.0–0.7)
EOS PCT: 0.4 % (ref 0.0–5.0)
HEMATOCRIT: 43.3 % (ref 36.0–46.0)
Hemoglobin: 14.6 g/dL (ref 12.0–15.0)
LYMPHS PCT: 16.6 % (ref 12.0–46.0)
Lymphs Abs: 1.8 10*3/uL (ref 0.7–4.0)
MCHC: 33.6 g/dL (ref 30.0–36.0)
MCV: 91.2 fl (ref 78.0–100.0)
MONO ABS: 0.5 10*3/uL (ref 0.1–1.0)
Monocytes Relative: 4.9 % (ref 3.0–12.0)
NEUTROS ABS: 8.4 10*3/uL — AB (ref 1.4–7.7)
Neutrophils Relative %: 77.6 % — ABNORMAL HIGH (ref 43.0–77.0)
PLATELETS: 295 10*3/uL (ref 150.0–400.0)
RBC: 4.75 Mil/uL (ref 3.87–5.11)
RDW: 12.8 % (ref 11.5–15.5)
WBC: 10.8 10*3/uL — ABNORMAL HIGH (ref 4.0–10.5)

## 2016-01-22 LAB — COMPREHENSIVE METABOLIC PANEL
ALBUMIN: 4.3 g/dL (ref 3.5–5.2)
ALK PHOS: 86 U/L (ref 39–117)
ALT: 24 U/L (ref 0–35)
AST: 18 U/L (ref 0–37)
BUN: 14 mg/dL (ref 6–23)
CHLORIDE: 104 meq/L (ref 96–112)
CO2: 26 mEq/L (ref 19–32)
Calcium: 9.4 mg/dL (ref 8.4–10.5)
Creatinine, Ser: 0.77 mg/dL (ref 0.40–1.20)
GFR: 85.95 mL/min (ref >60.00)
Glucose, Bld: 95 mg/dL (ref 70–99)
POTASSIUM: 3.6 meq/L (ref 3.5–5.1)
SODIUM: 137 meq/L (ref 135–145)
TOTAL PROTEIN: 7.4 g/dL (ref 6.0–8.3)
Total Bilirubin: 0.6 mg/dL (ref 0.2–1.2)

## 2016-01-22 LAB — C-REACTIVE PROTEIN: CRP: 1.3 mg/dL (ref 0.5–20.0)

## 2016-01-22 LAB — CK: Total CK: 43 U/L (ref 7–177)

## 2016-01-22 LAB — FERRITIN: Ferritin: 127 ng/mL (ref 10.0–291.0)

## 2016-01-22 LAB — SEDIMENTATION RATE: SED RATE: 28 mm/h — AB (ref 0–20)

## 2016-01-22 LAB — TSH: TSH: 2.82 u[IU]/mL (ref 0.35–4.50)

## 2016-01-22 LAB — VITAMIN D 25 HYDROXY (VIT D DEFICIENCY, FRACTURES): VITD: 26.99 ng/mL — AB (ref 30.00–100.00)

## 2016-01-22 MED ORDER — TIZANIDINE HCL 4 MG PO TABS
4.0000 mg | ORAL_TABLET | Freq: Three times a day (TID) | ORAL | Status: DC | PRN
Start: 2016-01-22 — End: 2016-04-15

## 2016-01-22 NOTE — Patient Instructions (Signed)
DASH Eating Plan  DASH stands for "Dietary Approaches to Stop Hypertension." The DASH eating plan is a healthy eating plan that has been shown to reduce high blood pressure (hypertension). Additional health benefits may include reducing the risk of type 2 diabetes mellitus, heart disease, and stroke. The DASH eating plan may also help with weight loss.  WHAT DO I NEED TO KNOW ABOUT THE DASH EATING PLAN?  For the DASH eating plan, you will follow these general guidelines:  · Choose foods with a percent daily value for sodium of less than 5% (as listed on the food label).  · Use salt-free seasonings or herbs instead of table salt or sea salt.  · Check with your health care provider or pharmacist before using salt substitutes.  · Eat lower-sodium products, often labeled as "lower sodium" or "no salt added."  · Eat fresh foods.  · Eat more vegetables, fruits, and low-fat dairy products.  · Choose whole grains. Look for the word "whole" as the first word in the ingredient list.  · Choose fish and skinless chicken or turkey more often than red meat. Limit fish, poultry, and meat to 6 oz (170 g) each day.  · Limit sweets, desserts, sugars, and sugary drinks.  · Choose heart-healthy fats.  · Limit cheese to 1 oz (28 g) per day.  · Eat more home-cooked food and less restaurant, buffet, and fast food.  · Limit fried foods.  · Cook foods using methods other than frying.  · Limit canned vegetables. If you do use them, rinse them well to decrease the sodium.  · When eating at a restaurant, ask that your food be prepared with less salt, or no salt if possible.  WHAT FOODS CAN I EAT?  Seek help from a dietitian for individual calorie needs.  Grains  Whole grain or whole wheat bread. Brown rice. Whole grain or whole wheat pasta. Quinoa, bulgur, and whole grain cereals. Low-sodium cereals. Corn or whole wheat flour tortillas. Whole grain cornbread. Whole grain crackers. Low-sodium crackers.  Vegetables  Fresh or frozen vegetables  (raw, steamed, roasted, or grilled). Low-sodium or reduced-sodium tomato and vegetable juices. Low-sodium or reduced-sodium tomato sauce and paste. Low-sodium or reduced-sodium canned vegetables.   Fruits  All fresh, canned (in natural juice), or frozen fruits.  Meat and Other Protein Products  Ground beef (85% or leaner), grass-fed beef, or beef trimmed of fat. Skinless chicken or turkey. Ground chicken or turkey. Pork trimmed of fat. All fish and seafood. Eggs. Dried beans, peas, or lentils. Unsalted nuts and seeds. Unsalted canned beans.  Dairy  Low-fat dairy products, such as skim or 1% milk, 2% or reduced-fat cheeses, low-fat ricotta or cottage cheese, or plain low-fat yogurt. Low-sodium or reduced-sodium cheeses.  Fats and Oils  Tub margarines without trans fats. Light or reduced-fat mayonnaise and salad dressings (reduced sodium). Avocado. Safflower, olive, or canola oils. Natural peanut or almond butter.  Other  Unsalted popcorn and pretzels.  The items listed above may not be a complete list of recommended foods or beverages. Contact your dietitian for more options.  WHAT FOODS ARE NOT RECOMMENDED?  Grains  White bread. White pasta. White rice. Refined cornbread. Bagels and croissants. Crackers that contain trans fat.  Vegetables  Creamed or fried vegetables. Vegetables in a cheese sauce. Regular canned vegetables. Regular canned tomato sauce and paste. Regular tomato and vegetable juices.  Fruits  Dried fruits. Canned fruit in light or heavy syrup. Fruit juice.  Meat and Other Protein   Products  Fatty cuts of meat. Ribs, chicken wings, bacon, sausage, bologna, salami, chitterlings, fatback, hot dogs, bratwurst, and packaged luncheon meats. Salted nuts and seeds. Canned beans with salt.  Dairy  Whole or 2% milk, cream, half-and-half, and cream cheese. Whole-fat or sweetened yogurt. Full-fat cheeses or blue cheese. Nondairy creamers and whipped toppings. Processed cheese, cheese spreads, or cheese  curds.  Condiments  Onion and garlic salt, seasoned salt, table salt, and sea salt. Canned and packaged gravies. Worcestershire sauce. Tartar sauce. Barbecue sauce. Teriyaki sauce. Soy sauce, including reduced sodium. Steak sauce. Fish sauce. Oyster sauce. Cocktail sauce. Horseradish. Ketchup and mustard. Meat flavorings and tenderizers. Bouillon cubes. Hot sauce. Tabasco sauce. Marinades. Taco seasonings. Relishes.  Fats and Oils  Butter, stick margarine, lard, shortening, ghee, and bacon fat. Coconut, palm kernel, or palm oils. Regular salad dressings.  Other  Pickles and olives. Salted popcorn and pretzels.  The items listed above may not be a complete list of foods and beverages to avoid. Contact your dietitian for more information.  WHERE CAN I FIND MORE INFORMATION?  National [employer], [employer], [employer]: www.nhlbi.nih.gov/health/health-topics/topics/dash/     This information is not intended to replace advice given to you by your health care provider. Make sure you discuss any questions you have with your health care provider.     Document Released: 06/23/2011 Document Revised: 07/25/2014 Document Reviewed: 05/08/2013  Elsevier Interactive Patient Education ©2016 [employer]

## 2016-01-22 NOTE — Progress Notes (Signed)
Pre visit review using our clinic review tool, if applicable. No additional management support is needed unless otherwise documented below in the visit note. 

## 2016-01-23 ENCOUNTER — Other Ambulatory Visit: Payer: Self-pay | Admitting: Family Medicine

## 2016-01-23 DIAGNOSIS — M542 Cervicalgia: Secondary | ICD-10-CM

## 2016-01-23 LAB — PROGESTERONE

## 2016-01-23 LAB — RHEUMATOID FACTOR

## 2016-01-24 ENCOUNTER — Encounter: Payer: Self-pay | Admitting: Family Medicine

## 2016-01-25 ENCOUNTER — Other Ambulatory Visit: Payer: Self-pay | Admitting: Family Medicine

## 2016-01-25 DIAGNOSIS — M542 Cervicalgia: Secondary | ICD-10-CM

## 2016-01-25 DIAGNOSIS — M25562 Pain in left knee: Secondary | ICD-10-CM

## 2016-01-25 MED ORDER — VITAMIN D (ERGOCALCIFEROL) 1.25 MG (50000 UNIT) PO CAPS: 50000.0000 [IU] | ORAL_CAPSULE | ORAL | Status: DC

## 2016-01-25 MED ORDER — PREDNISONE 20 MG PO TABS: 20.0000 mg | ORAL_TABLET | Freq: Three times a day (TID) | ORAL | Status: DC

## 2016-01-25 MED ORDER — PANTOPRAZOLE SODIUM 40 MG PO TBEC: DELAYED_RELEASE_TABLET | ORAL | Status: DC

## 2016-01-26 ENCOUNTER — Other Ambulatory Visit: Payer: Self-pay | Admitting: Family Medicine

## 2016-01-26 DIAGNOSIS — M542 Cervicalgia: Secondary | ICD-10-CM

## 2016-01-26 LAB — ESTROGENS, TOTAL: ESTROGEN: 646.1 pg/mL — AB

## 2016-01-27 ENCOUNTER — Encounter: Payer: Self-pay | Admitting: Family Medicine

## 2016-01-30 ENCOUNTER — Ambulatory Visit (HOSPITAL_BASED_OUTPATIENT_CLINIC_OR_DEPARTMENT_OTHER): Payer: BLUE CROSS/BLUE SHIELD

## 2016-01-31 DIAGNOSIS — E559 Vitamin D deficiency, unspecified: Secondary | ICD-10-CM | POA: Insufficient documentation

## 2016-01-31 DIAGNOSIS — M791 Myalgia, unspecified site: Secondary | ICD-10-CM | POA: Insufficient documentation

## 2016-01-31 DIAGNOSIS — N951 Menopausal and female climacteric states: Secondary | ICD-10-CM | POA: Insufficient documentation

## 2016-01-31 NOTE — Assessment & Plan Note (Signed)
Encouraged heart healthy diet, increase exercise, avoid trans fats, consider a krill oil cap daily 

## 2016-01-31 NOTE — Assessment & Plan Note (Signed)
Encouraged small frequent meals with lean proteins and minimal carbs. Increase exercise, keep stress down and get 6-8 hours of sleep. Labs drawn today.

## 2016-01-31 NOTE — Progress Notes (Signed)
Patient ID: [name], female   DOB: 1971-03-16, 45 y.o.   MRN: [id number]   Subjective:    Patient ID: [name], female    DOB: 12/23/1970, 45 y.o.   MRN: [id number]  Chief Complaint  Patient presents with  . Back Pain    HPI Patient is in today for follow up. She continues to struggle with numerous pain complaints. She has neck pain with radicular pain and weakness into both arms. Has numbness in both thumbs/ no recent trauma or falls. She also notes knee pain, thigh pain, shoulder pain. Complains of hot flashes and irritability as well. Had a steroid shot but it was not helpful. Is considering bariatric surgery. Denies CP/palp/SOB/HA/congestion/fevers/GI or GU c/o. Taking meds as prescribed  Past Medical History  Diagnosis Date  . Fibromyalgia   . Obesity   . Melanoma ([place])     right hip and knee  . Chicken pox   . Allergy   . Migraine   . UTI (lower urinary tract infection)   . GERD (gastroesophageal reflux disease)   . Anxiety disorder   . Anemia     in the past  . Depression     in teh past- ok now  . Shortness of breath     when anemic  . Complication of anesthesia 1999    urinary retention  . Neck pain on right side 12/23/2012  . Left thyroid nodule 03/31/2013  . Pain 03/31/2013  . TIA (transient ischemic attack) 03/31/2013  . Palpitations 08/10/2014  . Hyperlipidemia 08/07/2015  . Sinusitis 08/07/2015  . Anxiety   . Skin lesion of breast 12/06/2015  . Breast lesion 12/06/2015    Past Surgical History  Procedure Laterality Date  . Back surgery    . Cholecystectomy    . Wisdom tooth extraction  1989  . Melanoma excision  2011    stage I and II  . Laparoscopic assisted vaginal hysterectomy N/A 09/13/2012    Procedure: LAPAROSCOPIC ASSISTED VAGINAL HYSTERECTOMY;  Surgeon: Lavonia Drafts, MD;  Location: [facility];  Service: Gynecology;  Laterality: N/A;  . Bilateral salpingectomy Bilateral 09/13/2012    Procedure: BILATERAL SALPINGECTOMY;  Surgeon:  Lavonia Drafts, MD;  Location: [facility];  Service: Gynecology;  Laterality: Bilateral;  . Abdominal hysterectomy    . Colonoscopy      Family History  Problem Relation Age of Onset  . Colon cancer Neg Hx   . Colitis Father     crohn's  . Heart disease Father     grandfather  . Heart attack Father 41  . Arthritis Father     s/p back surgery  . Diabetes      grandfather  . Irritable bowel syndrome    . Kidney disease      grandfather  . Arthritis Other   . Cancer Other     breast  . Hyperlipidemia Other   . Mental illness Other   . Other Mother     neurological autoimmune disease, chorea in trunk  . Emphysema Maternal Uncle   . Arthritis Maternal Grandmother   . Lupus Maternal Grandmother   . Dementia Maternal Grandmother   . CVA Maternal Grandmother   . Arthritis Paternal Grandmother   . Heart disease Paternal Grandmother     chf  . Diabetes Paternal Grandfather   . Kidney disease Paternal Grandfather   . Hypertension Paternal Grandfather   . Hyperlipidemia Paternal Grandfather   . Cancer Maternal Grandfather     bone  Social History   Social History  . Marital Status: Divorced    Spouse Name: N/A  . Number of Children: N/A  . Years of Education: N/A   Occupational History  . unemployed    Social History Main Topics  . Smoking status: Never Smoker   . Smokeless tobacco: Never Used     Comment: never used tobacco  . Alcohol Use: No  . Drug Use: No  . Sexual Activity: Not Currently    Birth Control/ Protection: None   Other Topics Concern  . Not on file   Social History Narrative   Daily caffeine    Outpatient Prescriptions Prior to Visit  Medication Sig Dispense Refill  . azelastine (ASTELIN) 0.1 % nasal spray Place 2 sprays into both nostrils 2 (two) times daily. Use in each nostril as directed 30 mL 12  . HYDROcodone-acetaminophen (NORCO) 10-325 MG tablet Take 1 tablet by mouth every 8 (eight) hours as needed. 60 tablet 0  .  levocetirizine (XYZAL) 5 MG tablet TAKE 1 TABLET BY MOUTH EVERY EVENING 30 tablet 1  . pantoprazole (PROTONIX) 40 MG tablet TAKE 1 TABLET (40 MG TOTAL) BY MOUTH 2 TIMES DAILY. 60 tablet 3  . diazepam (VALIUM) 10 MG tablet Take 1 tablet (10 mg total) by mouth every 12 (twelve) hours as needed for anxiety or sleep. (Patient not taking: Reported on 01/22/2016) 40 tablet 3  . gabapentin (NEURONTIN) 300 MG capsule 1 tab by mouth twice daily for 4 days then increase to TID (Patient not taking: Reported on 01/22/2016) 90 capsule 0  . carisoprodol (SOMA) 350 MG tablet Take 1 tablet (350 mg total) by mouth 4 (four) times daily as needed for muscle spasms. (Patient not taking: Reported on 01/22/2016) 60 tablet 0  . mupirocin cream (BACTROBAN) 2 % Apply 1 application topically 2 (two) times daily. 15 g 0   No facility-administered medications prior to visit.    Allergies  Allergen Reactions  . Adhesive [Tape] Dermatitis    Band- Aid adhesive  . Aleve [Naproxen Sodium]     Makes hands, legs, and feet swell  . Carbamazepine     Hallucinations and dizziness    Review of Systems  Constitutional: Negative for fever and malaise/fatigue.  HENT: Negative for congestion.   Eyes: Negative for blurred vision.  Respiratory: Negative for shortness of breath.   Cardiovascular: Negative for chest pain, palpitations and leg swelling.  Gastrointestinal: Negative for nausea, abdominal pain and blood in stool.  Genitourinary: Negative for dysuria and frequency.  Musculoskeletal: Positive for myalgias, back pain, joint pain and neck pain. Negative for falls.  Skin: Negative for rash.  Neurological: Negative for dizziness, loss of consciousness and headaches.  Endo/Heme/Allergies: Negative for environmental allergies.  Psychiatric/Behavioral: Negative for depression. The patient is not nervous/anxious.        Objective:    Physical Exam  Constitutional: She is oriented to person, place, and time. She appears  well-developed and well-nourished. No distress.  HENT:  Head: Normocephalic and atraumatic.  Nose: Nose normal.  Eyes: Right eye exhibits no discharge. Left eye exhibits no discharge.  Neck: Normal range of motion. Neck supple.  Cardiovascular: Normal rate and regular rhythm.   No murmur heard. Pulmonary/Chest: Effort normal and breath sounds normal.  Abdominal: Soft. Bowel sounds are normal. There is no tenderness.  Musculoskeletal: She exhibits no edema.  Neurological: She is alert and oriented to person, place, and time.  Skin: Skin is warm and dry.  Psychiatric: She has a  normal mood and affect.  Nursing note and vitals reviewed.   BP 110/76 mmHg  Pulse 78  Temp(Src) 98.2 F (36.8 C) (Oral)  Ht '5\' 8"'$  (1.727 m)  Wt 307 lb 8 oz (139.481 kg)  BMI 46.77 kg/m2  SpO2 97%  LMP 08/13/2012 Wt Readings from Last 3 Encounters:  01/22/16 307 lb 8 oz (139.481 kg)  01/06/16 314 lb 8 oz (142.656 kg)  12/01/15 306 lb 8 oz (139.027 kg)     Lab Results  Component Value Date   WBC 10.8* 01/22/2016   HGB 14.6 01/22/2016   HCT 43.3 01/22/2016   PLT 295.0 01/22/2016   GLUCOSE 95 01/22/2016   CHOL 219* 07/28/2014   TRIG 194* 09/26/2013   HDL 38* 09/26/2013   LDLCALC 114* 09/26/2013   ALT 24 01/22/2016   AST 18 01/22/2016   NA 137 01/22/2016   K 3.6 01/22/2016   CL 104 01/22/2016   CREATININE 0.77 01/22/2016   BUN 14 01/22/2016   CO2 26 01/22/2016   TSH 2.82 01/22/2016   INR 0.98 09/03/2010   HGBA1C 5.4 12/10/2010    Lab Results  Component Value Date   TSH 2.82 01/22/2016   Lab Results  Component Value Date   WBC 10.8* 01/22/2016   HGB 14.6 01/22/2016   HCT 43.3 01/22/2016   MCV 91.2 01/22/2016   PLT 295.0 01/22/2016   Lab Results  Component Value Date   NA 137 01/22/2016   K 3.6 01/22/2016   CO2 26 01/22/2016   GLUCOSE 95 01/22/2016   BUN 14 01/22/2016   CREATININE 0.77 01/22/2016   BILITOT 0.6 01/22/2016   ALKPHOS 86 01/22/2016   AST 18 01/22/2016   ALT  24 01/22/2016   PROT 7.4 01/22/2016   ALBUMIN 4.3 01/22/2016   CALCIUM 9.4 01/22/2016   GFR 85.95 01/22/2016   Lab Results  Component Value Date   CHOL 219* 07/28/2014   Lab Results  Component Value Date   HDL 38* 09/26/2013   Lab Results  Component Value Date   LDLCALC 114* 09/26/2013   Lab Results  Component Value Date   TRIG 194* 09/26/2013   Lab Results  Component Value Date   CHOLHDL 5.0 09/26/2013   Lab Results  Component Value Date   HGBA1C 5.4 12/10/2010       Assessment & Plan:   Problem List Items Addressed This Visit    Neck pain on right side    Now with increased pain and numbness in to both thumbs. Will proceed with MRI of cervical spine. Given rx of Tizanidine, Prednisone, gabapentin and will likely need referral to neurosurgery      GERD    Avoid offending foods, start probiotics. Do not eat large meals in late evening and consider raising head of bed.       Obesity    Encouraged DASH diet, decrease po intake and increase exercise as tolerated. Needs 7-8 hours of sleep nightly. Avoid trans fats, eat small, frequent meals every 4-5 hours with lean proteins, complex carbs and healthy fats. Minimize simple carbs      Hyperlipidemia    Encouraged heart healthy diet, increase exercise, avoid trans fats, consider a krill oil cap daily      Perimenopausal - Primary    Encouraged small frequent meals with lean proteins and minimal carbs. Increase exercise, keep stress down and get 6-8 hours of sleep. Labs drawn today.       Relevant Orders   DG Cervical Spine Complete (  Completed)   DG Knee Complete 4 Views Left (Completed)   DG Shoulder Left (Completed)   DG Shoulder Right (Completed)   Sed Rate (ESR) (Completed)   Rheumatoid Factor (Completed)   Vitamin D (25 hydroxy) (Completed)   CK (Creatine Kinase) (Completed)   C-reactive protein (Completed)   CBC with Differential (Completed)   Ferritin (Completed)   Comp Met (CMET) (Completed)   TSH  (Completed)   Estrogens, Total (Completed)   Progesterone (Completed)   Vitamin D deficiency    Labs reveal deficiency. Start on Vitamin D 50000 IU caps, 1 cap po weekly x 12 weeks. Disp #4 with 4 rf. Also take daily Vitamin D over the counter. If already taking a daily supplement increase by 1000 IU daily and if not start Vitamin D 2000 IU daily.       Relevant Orders   DG Cervical Spine Complete (Completed)   DG Knee Complete 4 Views Left (Completed)   DG Shoulder Left (Completed)   DG Shoulder Right (Completed)   Sed Rate (ESR) (Completed)   Rheumatoid Factor (Completed)   Vitamin D (25 hydroxy) (Completed)   CK (Creatine Kinase) (Completed)   C-reactive protein (Completed)   CBC with Differential (Completed)   Ferritin (Completed)   Comp Met (CMET) (Completed)   TSH (Completed)   Estrogens, Total (Completed)   Progesterone (Completed)   Myalgia   Relevant Orders   DG Cervical Spine Complete (Completed)   DG Knee Complete 4 Views Left (Completed)   DG Shoulder Left (Completed)   DG Shoulder Right (Completed)   Sed Rate (ESR) (Completed)   Rheumatoid Factor (Completed)   Vitamin D (25 hydroxy) (Completed)   CK (Creatine Kinase) (Completed)   C-reactive protein (Completed)   CBC with Differential (Completed)   Ferritin (Completed)   Comp Met (CMET) (Completed)   TSH (Completed)   Estrogens, Total (Completed)   Progesterone (Completed)    Other Visit Diagnoses    Arthralgia        Relevant Orders    DG Cervical Spine Complete (Completed)    DG Knee Complete 4 Views Left (Completed)    DG Shoulder Left (Completed)    DG Shoulder Right (Completed)    Sed Rate (ESR) (Completed)    Rheumatoid Factor (Completed)    Vitamin D (25 hydroxy) (Completed)    CK (Creatine Kinase) (Completed)    C-reactive protein (Completed)    CBC with Differential (Completed)    Ferritin (Completed)    Comp Met (CMET) (Completed)    TSH (Completed)    Estrogens, Total (Completed)     Progesterone (Completed)       I have discontinued Ms. Rigsby's carisoprodol and mupirocin cream. I am also having her start on tiZANidine. Additionally, I am having her maintain her diazepam, azelastine, levocetirizine, HYDROcodone-acetaminophen, and gabapentin.  Meds ordered this encounter  Medications  . tiZANidine (ZANAFLEX) 4 MG tablet    Sig: Take 1 tablet (4 mg total) by mouth every 8 (eight) hours as needed for muscle spasms.    Dispense:  60 tablet    Refill:  2     Penni Homans, MD

## 2016-01-31 NOTE — Assessment & Plan Note (Signed)
Encouraged DASH diet, decrease po intake and increase exercise as tolerated. Needs 7-8 hours of sleep nightly. Avoid trans fats, eat small, frequent meals every 4-5 hours with lean proteins, complex carbs and healthy fats. Minimize simple carbs 

## 2016-01-31 NOTE — Assessment & Plan Note (Signed)
Avoid offending foods, start probiotics. Do not eat large meals in late evening and consider raising head of bed.  

## 2016-01-31 NOTE — Assessment & Plan Note (Addendum)
Now with increased pain and numbness in to both thumbs. Will proceed with MRI of cervical spine. Given rx of Tizanidine, Prednisone, gabapentin and will likely need referral to neurosurgery

## 2016-01-31 NOTE — Assessment & Plan Note (Signed)
Labs reveal deficiency. Start on Vitamin D 50000 IU caps, 1 cap po weekly x 12 weeks. Disp #4 with 4 rf. Also take daily Vitamin D over the counter. If already taking a daily supplement increase by 1000 IU daily and if not start Vitamin D 2000 IU daily.  

## 2016-02-06 ENCOUNTER — Ambulatory Visit (HOSPITAL_BASED_OUTPATIENT_CLINIC_OR_DEPARTMENT_OTHER)
Admission: RE | Admit: 2016-02-06 | Discharge: 2016-02-06 | Disposition: A | Discharge status: Home / Self Care | Payer: BLUE CROSS/BLUE SHIELD | Source: Ambulatory Visit | Attending: Family Medicine | Admitting: Family Medicine

## 2016-02-06 DIAGNOSIS — M47892 Other spondylosis, cervical region: Secondary | ICD-10-CM | POA: Insufficient documentation

## 2016-02-06 DIAGNOSIS — M542 Cervicalgia: Secondary | ICD-10-CM | POA: Diagnosis not present

## 2016-02-06 DIAGNOSIS — M4802 Spinal stenosis, cervical region: Secondary | ICD-10-CM | POA: Diagnosis not present

## 2016-02-06 DIAGNOSIS — M50222 Other cervical disc displacement at C5-C6 level: Secondary | ICD-10-CM | POA: Diagnosis not present

## 2016-02-07 ENCOUNTER — Other Ambulatory Visit: Payer: Self-pay | Admitting: Family Medicine

## 2016-02-07 DIAGNOSIS — M542 Cervicalgia: Principal | ICD-10-CM

## 2016-02-07 DIAGNOSIS — M25519 Pain in unspecified shoulder: Secondary | ICD-10-CM

## 2016-02-08 ENCOUNTER — Encounter: Payer: Self-pay | Admitting: Family Medicine

## 2016-02-15 DIAGNOSIS — J0301 Acute recurrent streptococcal tonsillitis: Secondary | ICD-10-CM | POA: Diagnosis not present

## 2016-02-15 DIAGNOSIS — J019 Acute sinusitis, unspecified: Secondary | ICD-10-CM | POA: Diagnosis not present

## 2016-02-15 DIAGNOSIS — H9313 Tinnitus, bilateral: Secondary | ICD-10-CM | POA: Diagnosis not present

## 2016-02-15 DIAGNOSIS — J302 Other seasonal allergic rhinitis: Secondary | ICD-10-CM | POA: Diagnosis not present

## 2016-02-15 DIAGNOSIS — J0141 Acute recurrent pansinusitis: Secondary | ICD-10-CM | POA: Diagnosis not present

## 2016-02-16 ENCOUNTER — Other Ambulatory Visit: Payer: Self-pay | Admitting: Family Medicine

## 2016-02-16 MED ORDER — VENLAFAXINE HCL ER 75 MG PO CP24: 75.0000 mg | ORAL_CAPSULE | Freq: Every day | ORAL | 3 refills | Status: DC

## 2016-02-24 ENCOUNTER — Other Ambulatory Visit: Payer: Self-pay | Admitting: Physician Assistant

## 2016-02-26 ENCOUNTER — Other Ambulatory Visit: Payer: Self-pay | Admitting: Family Medicine

## 2016-02-26 MED ORDER — HYDROCODONE-ACETAMINOPHEN 10-325 MG PO TABS: 1.0000 | ORAL_TABLET | Freq: Three times a day (TID) | ORAL | 0 refills | Status: DC | PRN

## 2016-02-26 MED ORDER — LEVOCETIRIZINE DIHYDROCHLORIDE 5 MG PO TABS: 5.0000 mg | ORAL_TABLET | Freq: Every evening | ORAL | 1 refills | Status: DC

## 2016-02-29 MED FILL — HYDROCODON-APAP 10-325: 10-325 | 20 days supply | Qty: 60 | Fill #0

## 2016-03-05 ENCOUNTER — Other Ambulatory Visit: Payer: Self-pay | Admitting: Physician Assistant

## 2016-03-07 NOTE — Telephone Encounter (Signed)
Will defer further refills of patient's medications to PCP  

## 2016-03-08 DIAGNOSIS — M25512 Pain in left shoulder: Secondary | ICD-10-CM | POA: Diagnosis not present

## 2016-03-08 DIAGNOSIS — M25511 Pain in right shoulder: Secondary | ICD-10-CM | POA: Diagnosis not present

## 2016-03-08 DIAGNOSIS — Z6841 Body Mass Index (BMI) 40.0 and over, adult: Secondary | ICD-10-CM | POA: Diagnosis not present

## 2016-03-08 DIAGNOSIS — M5412 Radiculopathy, cervical region: Secondary | ICD-10-CM | POA: Diagnosis not present

## 2016-03-14 DIAGNOSIS — L821 Other seborrheic keratosis: Secondary | ICD-10-CM | POA: Diagnosis not present

## 2016-03-14 DIAGNOSIS — L918 Other hypertrophic disorders of the skin: Secondary | ICD-10-CM | POA: Diagnosis not present

## 2016-03-14 DIAGNOSIS — L718 Other rosacea: Secondary | ICD-10-CM | POA: Diagnosis not present

## 2016-03-18 ENCOUNTER — Ambulatory Visit (INDEPENDENT_AMBULATORY_CARE_PROVIDER_SITE_OTHER): Payer: BLUE CROSS/BLUE SHIELD | Admitting: Family Medicine

## 2016-03-18 ENCOUNTER — Encounter: Payer: Self-pay | Admitting: Family Medicine

## 2016-03-18 VITALS — BP 107/55 | HR 78 | Temp 97.9°F | Wt 309.4 lb

## 2016-03-18 DIAGNOSIS — F329 Major depressive disorder, single episode, unspecified: Secondary | ICD-10-CM

## 2016-03-18 DIAGNOSIS — R739 Hyperglycemia, unspecified: Secondary | ICD-10-CM

## 2016-03-18 DIAGNOSIS — R1084 Generalized abdominal pain: Secondary | ICD-10-CM | POA: Diagnosis not present

## 2016-03-18 DIAGNOSIS — K219 Gastro-esophageal reflux disease without esophagitis: Secondary | ICD-10-CM

## 2016-03-18 DIAGNOSIS — E669 Obesity, unspecified: Secondary | ICD-10-CM

## 2016-03-18 DIAGNOSIS — F32A Depression, unspecified: Secondary | ICD-10-CM

## 2016-03-18 DIAGNOSIS — F419 Anxiety disorder, unspecified: Secondary | ICD-10-CM

## 2016-03-18 DIAGNOSIS — D509 Iron deficiency anemia, unspecified: Secondary | ICD-10-CM

## 2016-03-18 DIAGNOSIS — M5440 Lumbago with sciatica, unspecified side: Secondary | ICD-10-CM | POA: Diagnosis not present

## 2016-03-18 DIAGNOSIS — E785 Hyperlipidemia, unspecified: Secondary | ICD-10-CM

## 2016-03-18 DIAGNOSIS — R197 Diarrhea, unspecified: Secondary | ICD-10-CM | POA: Diagnosis not present

## 2016-03-18 DIAGNOSIS — F418 Other specified anxiety disorders: Secondary | ICD-10-CM

## 2016-03-18 DIAGNOSIS — E559 Vitamin D deficiency, unspecified: Secondary | ICD-10-CM

## 2016-03-18 DIAGNOSIS — R109 Unspecified abdominal pain: Secondary | ICD-10-CM

## 2016-03-18 DIAGNOSIS — M791 Myalgia, unspecified site: Secondary | ICD-10-CM

## 2016-03-18 HISTORY — DX: Hyperglycemia, unspecified: R73.9

## 2016-03-18 HISTORY — DX: Unspecified abdominal pain: R10.9

## 2016-03-18 HISTORY — DX: Diarrhea, unspecified: R19.7

## 2016-03-18 LAB — COMPREHENSIVE METABOLIC PANEL
ALK PHOS: 84 U/L (ref 39–117)
ALT: 25 U/L (ref 0–35)
AST: 15 U/L (ref 0–37)
Albumin: 4.1 g/dL (ref 3.5–5.2)
BUN: 10 mg/dL (ref 6–23)
CHLORIDE: 103 meq/L (ref 96–112)
CO2: 26 mEq/L (ref 19–32)
Calcium: 9 mg/dL (ref 8.4–10.5)
Creatinine, Ser: 0.64 mg/dL (ref 0.40–1.20)
GFR: 106.32 mL/min (ref >60.00)
GLUCOSE: 88 mg/dL (ref 70–99)
POTASSIUM: 3.8 meq/L (ref 3.5–5.1)
SODIUM: 138 meq/L (ref 135–145)
TOTAL PROTEIN: 7.4 g/dL (ref 6.0–8.3)
Total Bilirubin: 0.5 mg/dL (ref 0.2–1.2)

## 2016-03-18 LAB — URINALYSIS
Bilirubin Urine: NEGATIVE
HGB URINE DIPSTICK: NEGATIVE
Ketones, ur: NEGATIVE
LEUKOCYTES UA: NEGATIVE
NITRITE: NEGATIVE
Specific Gravity, Urine: 1.02 (ref 1.000–1.030)
Total Protein, Urine: NEGATIVE
URINE GLUCOSE: NEGATIVE
UROBILINOGEN UA: 0.2 (ref 0.0–1.0)
pH: 5.5 (ref 5.0–8.0)

## 2016-03-18 LAB — CBC WITH DIFFERENTIAL/PLATELET
BASOS PCT: 0.4 % (ref 0.0–3.0)
Basophils Absolute: 0 10*3/uL (ref 0.0–0.1)
EOS PCT: 0.7 % (ref 0.0–5.0)
Eosinophils Absolute: 0.1 10*3/uL (ref 0.0–0.7)
HCT: 44.7 % (ref 36.0–46.0)
Hemoglobin: 15.2 g/dL — ABNORMAL HIGH (ref 12.0–15.0)
LYMPHS ABS: 1.8 10*3/uL (ref 0.7–4.0)
Lymphocytes Relative: 18.5 % (ref 12.0–46.0)
MCHC: 34 g/dL (ref 30.0–36.0)
MCV: 91 fl (ref 78.0–100.0)
MONO ABS: 0.5 10*3/uL (ref 0.1–1.0)
Monocytes Relative: 4.8 % (ref 3.0–12.0)
NEUTROS PCT: 75.6 % (ref 43.0–77.0)
Neutro Abs: 7.6 10*3/uL (ref 1.4–7.7)
Platelets: 247 10*3/uL (ref 150.0–400.0)
RBC: 4.91 Mil/uL (ref 3.87–5.11)
RDW: 13.3 % (ref 11.5–15.5)
WBC: 10 10*3/uL (ref 4.0–10.5)

## 2016-03-18 LAB — HEMOGLOBIN A1C: Hgb A1c MFr Bld: 5.3 % (ref 4.6–6.5)

## 2016-03-18 LAB — H. PYLORI ANTIBODY, IGG: H Pylori IgG: NEGATIVE

## 2016-03-18 LAB — SEDIMENTATION RATE: SED RATE: 25 mm/h — AB (ref 0–20)

## 2016-03-18 MED ORDER — HYOSCYAMINE SULFATE 0.125 MG PO TABS: 0.1250 mg | ORAL_TABLET | ORAL | 0 refills | Status: DC | PRN

## 2016-03-18 MED ORDER — VENLAFAXINE HCL ER 150 MG PO CP24: 150.0000 mg | ORAL_CAPSULE | Freq: Every day | ORAL | 2 refills | Status: DC

## 2016-03-18 NOTE — Assessment & Plan Note (Signed)
Strong history of family hyperglycemia. Personal history of hyperglycemia. Check a hgba1c today

## 2016-03-18 NOTE — Progress Notes (Signed)
Pre visit review using our clinic review tool, if applicable. No additional management support is needed unless otherwise documented below in the visit note. 

## 2016-03-18 NOTE — Patient Instructions (Signed)

## 2016-03-20 LAB — URINE CULTURE

## 2016-04-03 NOTE — Assessment & Plan Note (Signed)
Still notes occasional LLQ pain but tolerable and bowels moving well. Encouraged increased hydration and fiber in diet. Daily probiotics. If bowels not moving can use MOM 2 tbls po in 4 oz of warm prune juice by mouth every 2-3 days. If no results then repeat in 4 hours with  Dulcolax suppository pr, may repeat again in 4 more hours as needed. Seek care if symptoms worsen. Consider daily Miralax and/or Dulcolax if symptoms persist. If worsens let us know

## 2016-04-03 NOTE — Assessment & Plan Note (Signed)
Will increase Venlafaxine XR to 150 mg daily.

## 2016-04-03 NOTE — Progress Notes (Signed)
Patient ID: [name], female   DOB: [birth year], 45 y.o.   MRN: [id number]   Subjective:    Patient ID: [name], female    DOB: [birth year], 45 y.o.   MRN: [id number]  Chief Complaint  Patient presents with  . Follow-up    HPI Patient is in today for follow up. She is struggling with fatigue and depression. No recent illness or hospitalization. Did suffer a fall without warning a couple of days ago but no injury or syncope. Does note some intermittent LLQ pain but bowles are moving well. No bloody or tarry stool. No change in appetite or vomiting. Denies CP/palp/SOB/HA/congestion/fevers/GI or GU c/o. Taking meds as prescribed  Past Medical History:  Diagnosis Date  . Abdominal pain 03/18/2016  . Allergy   . Anemia    in the past  . Anxiety   . Anxiety disorder   . Breast lesion 12/06/2015  . Chicken pox   . Complication of anesthesia 1999   urinary retention  . Depression    in teh past- ok now  . Diarrhea 03/18/2016  . Fibromyalgia   . GERD (gastroesophageal reflux disease)   . Hyperglycemia 03/18/2016  . Hyperlipidemia 08/07/2015  . Left thyroid nodule 03/31/2013  . Melanoma ([place])    right hip and knee  . Migraine   . Neck pain on right side 12/23/2012  . Obesity   . Pain 03/31/2013  . Palpitations 08/10/2014  . Shortness of breath    when anemic  . Sinusitis 08/07/2015  . Skin lesion of breast 12/06/2015  . TIA (transient ischemic attack) 03/31/2013  . UTI (lower urinary tract infection)     Past Surgical History:  Procedure Laterality Date  . ABDOMINAL HYSTERECTOMY    . BACK SURGERY    . BILATERAL SALPINGECTOMY Bilateral 09/13/2012   Procedure: BILATERAL SALPINGECTOMY;  Surgeon: Lavonia Drafts, MD;  Location: [facility];  Service: Gynecology;  Laterality: Bilateral;  . CHOLECYSTECTOMY    . COLONOSCOPY    . LAPAROSCOPIC ASSISTED VAGINAL HYSTERECTOMY N/A 09/13/2012   Procedure: LAPAROSCOPIC ASSISTED VAGINAL HYSTERECTOMY;  Surgeon: Lavonia Drafts,  MD;  Location: [facility];  Service: Gynecology;  Laterality: N/A;  . MELANOMA EXCISION  2011   stage I and II  . WISDOM TOOTH EXTRACTION  1989    Family History  Problem Relation Age of Onset  . Colon cancer Neg Hx   . Colitis Father     crohn's  . Heart disease Father     grandfather  . Heart attack Father 57  . Arthritis Father     s/p back surgery  . Diabetes      grandfather  . Irritable bowel syndrome    . Kidney disease      grandfather  . Arthritis Other   . Cancer Other     breast  . Hyperlipidemia Other   . Mental illness Other   . Other Mother     neurological autoimmune disease, chorea in trunk  . Emphysema Maternal Uncle   . Arthritis Maternal Grandmother   . Lupus Maternal Grandmother   . Dementia Maternal Grandmother   . CVA Maternal Grandmother   . Arthritis Paternal Grandmother   . Heart disease Paternal Grandmother     chf  . Diabetes Paternal Grandfather   . Kidney disease Paternal Grandfather   . Hypertension Paternal Grandfather   . Hyperlipidemia Paternal Grandfather   . Cancer Maternal Grandfather     bone    Social History  Social History  . Marital status: Divorced    Spouse name: N/A  . Number of children: N/A  . Years of education: N/A   Occupational History  . unemployed    Social History Main Topics  . Smoking status: Never Smoker  . Smokeless tobacco: Never Used     Comment: never used tobacco  . Alcohol use No  . Drug use: No  . Sexual activity: Not Currently    Birth control/ protection: None   Other Topics Concern  . Not on file   Social History Narrative   Daily caffeine    Outpatient Medications Prior to Visit  Medication Sig Dispense Refill  . azelastine (ASTELIN) 0.1 % nasal spray Place 2 sprays into both nostrils 2 (two) times daily. Use in each nostril as directed 30 mL 12  . carisoprodol (SOMA) 350 MG tablet TAKE 1 TABLET BY MOUTH FOUR TIMES DAILY AS NEEDED FOR MUSCLE SPASMS 60 tablet 0  . diazepam  (VALIUM) 10 MG tablet Take 1 tablet (10 mg total) by mouth every 12 (twelve) hours as needed for anxiety or sleep. 40 tablet 3  . gabapentin (NEURONTIN) 300 MG capsule 1 tab by mouth twice daily for 4 days then increase to TID 90 capsule 0  . HYDROcodone-acetaminophen (NORCO) 10-325 MG tablet Take 1 tablet by mouth every 8 (eight) hours as needed. 60 tablet 0  . levocetirizine (XYZAL) 5 MG tablet Take 1 tablet (5 mg total) by mouth every evening. 90 tablet 1  . pantoprazole (PROTONIX) 40 MG tablet TAKE 1 TABLET (40 MG TOTAL) BY MOUTH 2 TIMES DAILY. 180 tablet 2  . Vitamin D, Ergocalciferol, (DRISDOL) 50000 units CAPS capsule Take 1 capsule (50,000 Units total) by mouth every 7 (seven) days. 4 capsule 4  . venlafaxine XR (EFFEXOR-XR) 75 MG 24 hr capsule Take 1 capsule (75 mg total) by mouth daily. 30 capsule 3  . predniSONE (DELTASONE) 20 MG tablet Take 1 tablet (20 mg total) by mouth 3 (three) times daily. Take for 5 days. (Patient not taking: Reported on 03/18/2016) 15 tablet 0  . tiZANidine (ZANAFLEX) 4 MG tablet Take 1 tablet (4 mg total) by mouth every 8 (eight) hours as needed for muscle spasms. (Patient not taking: Reported on 03/18/2016) 60 tablet 2   No facility-administered medications prior to visit.     Allergies  Allergen Reactions  . Adhesive [Tape] Dermatitis    Band- Aid adhesive  . Aleve [Naproxen Sodium]     Makes hands, legs, and feet swell  . Carbamazepine     Hallucinations and dizziness    Review of Systems  Constitutional: Positive for malaise/fatigue. Negative for fever.  HENT: Negative for congestion.   Eyes: Negative for blurred vision.  Respiratory: Negative for shortness of breath.   Cardiovascular: Negative for chest pain, palpitations and leg swelling.  Gastrointestinal: Positive for abdominal pain. Negative for blood in stool and nausea.  Genitourinary: Negative for dysuria and frequency.  Musculoskeletal: Negative for falls.  Skin: Negative for rash.    Neurological: Negative for dizziness, loss of consciousness and headaches.  Endo/Heme/Allergies: Negative for environmental allergies.  Psychiatric/Behavioral: Positive for depression. The patient is nervous/anxious.        Objective:    Physical Exam  Constitutional: She is oriented to person, place, and time. She appears well-developed and well-nourished. No distress.  HENT:  Head: Normocephalic and atraumatic.  Nose: Nose normal.  Eyes: Right eye exhibits no discharge. Left eye exhibits no discharge.  Neck: Normal range  of motion. Neck supple.  Cardiovascular: Normal rate and regular rhythm.   No murmur heard. Pulmonary/Chest: Effort normal and breath sounds normal.  Abdominal: Soft. Bowel sounds are normal. There is no tenderness.  Musculoskeletal: She exhibits no edema.  Neurological: She is alert and oriented to person, place, and time.  Skin: Skin is warm and dry.  Psychiatric: She has a normal mood and affect.  Nursing note and vitals reviewed.   BP (!) 107/55 (BP Location: Left Arm, Patient Position: Sitting, Cuff Size: Normal)   Pulse 78   Temp 97.9 F (36.6 C) (Oral)   Wt (!) 309 lb 6.4 oz (140.3 kg)   LMP 08/13/2012   SpO2 98%   BMI 47.04 kg/m  Wt Readings from Last 3 Encounters:  03/18/16 (!) 309 lb 6.4 oz (140.3 kg)  01/22/16 (!) 307 lb 8 oz (139.5 kg)  01/06/16 (!) 314 lb 8 oz (142.7 kg)     Lab Results  Component Value Date   WBC 10.0 03/18/2016   HGB 15.2 (H) 03/18/2016   HCT 44.7 03/18/2016   PLT 247.0 03/18/2016   GLUCOSE 88 03/18/2016   CHOL 219 (A) 07/28/2014   TRIG 194 (H) 09/26/2013   HDL 38 (L) 09/26/2013   LDLCALC 114 (H) 09/26/2013   ALT 25 03/18/2016   AST 15 03/18/2016   NA 138 03/18/2016   K 3.8 03/18/2016   CL 103 03/18/2016   CREATININE 0.64 03/18/2016   BUN 10 03/18/2016   CO2 26 03/18/2016   TSH 2.82 01/22/2016   INR 0.98 09/03/2010   HGBA1C 5.3 03/18/2016    Lab Results  Component Value Date   TSH 2.82 01/22/2016    Lab Results  Component Value Date   WBC 10.0 03/18/2016   HGB 15.2 (H) 03/18/2016   HCT 44.7 03/18/2016   MCV 91.0 03/18/2016   PLT 247.0 03/18/2016   Lab Results  Component Value Date   NA 138 03/18/2016   K 3.8 03/18/2016   CO2 26 03/18/2016   GLUCOSE 88 03/18/2016   BUN 10 03/18/2016   CREATININE 0.64 03/18/2016   BILITOT 0.5 03/18/2016   ALKPHOS 84 03/18/2016   AST 15 03/18/2016   ALT 25 03/18/2016   PROT 7.4 03/18/2016   ALBUMIN 4.1 03/18/2016   CALCIUM 9.0 03/18/2016   GFR 106.32 03/18/2016   Lab Results  Component Value Date   CHOL 219 (A) 07/28/2014   Lab Results  Component Value Date   HDL 38 (L) 09/26/2013   Lab Results  Component Value Date   LDLCALC 114 (H) 09/26/2013   Lab Results  Component Value Date   TRIG 194 (H) 09/26/2013   Lab Results  Component Value Date   CHOLHDL 5.0 09/26/2013   Lab Results  Component Value Date   HGBA1C 5.3 03/18/2016       Assessment & Plan:   Problem List Items Addressed This Visit    Iron deficiency anemia   GERD    Avoid offending foods, start probiotics. Do not eat large meals in late evening and consider raising head of bed.       Relevant Medications   hyoscyamine (LEVSIN, ANASPAZ) 0.125 MG tablet   Obesity   Anxiety and depression    Will increase Venlafaxine XR to 150 mg daily.       Back pain   Relevant Orders   CBC w/Diff (Completed)   Hyperlipidemia    Encouraged heart healthy diet, increase exercise, avoid trans fats, consider a krill  oil cap daily      Relevant Orders   Sed Rate (ESR) (Completed)   Vitamin D deficiency - Primary    Vitamin D 50000 IU caps, 1 cap po weekly. Also take daily Vitamin D over the counter. If already taking a daily supplement increase by 1000 IU daily and if not start Vitamin D 2000 IU daily.       Myalgia   Hyperglycemia    Strong history of family hyperglycemia. Personal history of hyperglycemia. Check a hgba1c today      Relevant Orders    Comprehensive metabolic panel (Completed)   Hemoglobin A1c (Completed)   Diarrhea   Relevant Orders   CBC w/Diff (Completed)   Sed Rate (ESR) (Completed)   H. pylori antibody, IgG (Completed)   Stool culture   Ova and parasite examination   Giardia Antigen (Stool)   Stool, WBC/Lactoferrin   Abdominal pain    Still notes occasional LLQ pain but tolerable and bowels moving well. Encouraged increased hydration and fiber in diet. Daily probiotics. If bowels not moving can use MOM 2 tbls po in 4 oz of warm prune juice by mouth every 2-3 days. If no results then repeat in 4 hours with  Dulcolax suppository pr, may repeat again in 4 more hours as needed. Seek care if symptoms worsen. Consider daily Miralax and/or Dulcolax if symptoms persist. If worsens let us know      Relevant Orders   Urine culture (Completed)   Urinalysis (Completed)   CBC w/Diff (Completed)   Sed Rate (ESR) (Completed)    Other Visit Diagnoses   None.     I have discontinued Ms. Mathurin's venlafaxine XR. I am also having her start on hyoscyamine and venlafaxine XR. Additionally, I am having her maintain her diazepam, azelastine, gabapentin, tiZANidine, Vitamin D (Ergocalciferol), predniSONE, pantoprazole, HYDROcodone-acetaminophen, levocetirizine, and carisoprodol.  Meds ordered this encounter  Medications  . hyoscyamine (LEVSIN, ANASPAZ) 0.125 MG tablet    Sig: Take 1 tablet (0.125 mg total) by mouth every 4 (four) hours as needed.    Dispense:  30 tablet    Refill:  0  . venlafaxine XR (EFFEXOR XR) 150 MG 24 hr capsule    Sig: Take 1 capsule (150 mg total) by mouth daily with breakfast.    Dispense:  30 capsule    Refill:  2     Penni Homans, MD

## 2016-04-03 NOTE — Assessment & Plan Note (Signed)
Avoid offending foods, start probiotics. Do not eat large meals in late evening and consider raising head of bed.  

## 2016-04-03 NOTE — Assessment & Plan Note (Signed)
Encouraged heart healthy diet, increase exercise, avoid trans fats, consider a krill oil cap daily 

## 2016-04-03 NOTE — Assessment & Plan Note (Signed)
Vitamin D 50000 IU caps, 1 cap po weekly. Also take daily Vitamin D over the counter. If already taking a daily supplement increase by 1000 IU daily and if not start Vitamin D 2000 IU daily.

## 2016-04-09 ENCOUNTER — Other Ambulatory Visit: Payer: Self-pay | Admitting: Family Medicine

## 2016-04-09 ENCOUNTER — Other Ambulatory Visit: Payer: Self-pay | Admitting: Family

## 2016-04-12 MED ORDER — HYDROCODONE-ACETAMINOPHEN 10-325 MG PO TABS: 1.0000 | ORAL_TABLET | Freq: Three times a day (TID) | ORAL | 0 refills | Status: DC | PRN

## 2016-04-12 MED ORDER — CARISOPRODOL 350 MG PO TABS: 350.0000 mg | ORAL_TABLET | Freq: Two times a day (BID) | ORAL | 1 refills | Status: DC | PRN

## 2016-04-12 NOTE — Telephone Encounter (Signed)
Called the patient left a detailed message to pickup hardcopy of both Hydrocodone and Soma at our front desk.

## 2016-04-15 ENCOUNTER — Encounter: Payer: Self-pay | Admitting: Physician Assistant

## 2016-04-15 ENCOUNTER — Ambulatory Visit (INDEPENDENT_AMBULATORY_CARE_PROVIDER_SITE_OTHER): Payer: BLUE CROSS/BLUE SHIELD | Admitting: Physician Assistant

## 2016-04-15 VITALS — BP 108/88 | HR 83 | Temp 98.2°F | Resp 18 | Ht 68.0 in | Wt 308.0 lb

## 2016-04-15 DIAGNOSIS — B9689 Other specified bacterial agents as the cause of diseases classified elsewhere: Secondary | ICD-10-CM

## 2016-04-15 DIAGNOSIS — J019 Acute sinusitis, unspecified: Secondary | ICD-10-CM

## 2016-04-15 MED ORDER — FLUCONAZOLE 150 MG PO TABS: 150.0000 mg | ORAL_TABLET | Freq: Once | ORAL | 0 refills | Status: AC

## 2016-04-15 MED ORDER — AMOXICILLIN-POT CLAVULANATE 875-125 MG PO TABS: 1.0000 | ORAL_TABLET | Freq: Two times a day (BID) | ORAL | 0 refills | Status: DC

## 2016-04-15 MED FILL — HYDROCODON-APAP 10-325: 10-325 | 20 days supply | Qty: 60 | Fill #0

## 2016-04-15 MED FILL — CARISOPRODOL 350 MG TABLET: 350 | 30 days supply | Qty: 60 | Fill #0

## 2016-04-15 MED FILL — FLUCONAZOLE 150 MG TABLET: 150 | 2 days supply | Qty: 2 | Fill #0

## 2016-04-15 MED FILL — AMOX-CLAV 875-125 MG TABLET: 875-125 | 7 days supply | Qty: 14 | Fill #0

## 2016-04-15 NOTE — Patient Instructions (Signed)
Please take antibiotic as directed.  Increase fluid intake.  Use Saline nasal spray.  Take a daily multivitamin. Use Diflucan as directed if needed.  Place a humidifier in the bedroom.  Please call or return clinic if symptoms are not improving.  Sinusitis Sinusitis is redness, soreness, and swelling (inflammation) of the paranasal sinuses. Paranasal sinuses are air pockets within the bones of your face (beneath the eyes, the middle of the forehead, or above the eyes). In healthy paranasal sinuses, mucus is able to drain out, and air is able to circulate through them by way of your nose. However, when your paranasal sinuses are inflamed, mucus and air can become trapped. This can allow bacteria and other germs to grow and cause infection. Sinusitis can develop quickly and last only a short time (acute) or continue over a long period (chronic). Sinusitis that lasts for more than 12 weeks is considered chronic.  CAUSES  Causes of sinusitis include:  Allergies.  Structural abnormalities, such as displacement of the cartilage that separates your nostrils (deviated septum), which can decrease the air flow through your nose and sinuses and affect sinus drainage.  Functional abnormalities, such as when the small hairs (cilia) that line your sinuses and help remove mucus do not work properly or are not present. SYMPTOMS  Symptoms of acute and chronic sinusitis are the same. The primary symptoms are pain and pressure around the affected sinuses. Other symptoms include:  Upper toothache.  Earache.  Headache.  Bad breath.  Decreased sense of smell and taste.  A cough, which worsens when you are lying flat.  Fatigue.  Fever.  Thick drainage from your nose, which often is green and may contain pus (purulent).  Swelling and warmth over the affected sinuses. DIAGNOSIS  Your caregiver will perform a physical exam. During the exam, your caregiver may:  Look in your nose for signs of abnormal  growths in your nostrils (nasal polyps).  Tap over the affected sinus to check for signs of infection.  View the inside of your sinuses (endoscopy) with a special imaging device with a light attached (endoscope), which is inserted into your sinuses. If your caregiver suspects that you have chronic sinusitis, one or more of the following tests may be recommended:  Allergy tests.  Nasal culture A sample of mucus is taken from your nose and sent to a lab and screened for bacteria.  Nasal cytology A sample of mucus is taken from your nose and examined by your caregiver to determine if your sinusitis is related to an allergy. TREATMENT  Most cases of acute sinusitis are related to a viral infection and will resolve on their own within 10 days. Sometimes medicines are prescribed to help relieve symptoms (pain medicine, decongestants, nasal steroid sprays, or saline sprays).  However, for sinusitis related to a bacterial infection, your caregiver will prescribe antibiotic medicines. These are medicines that will help kill the bacteria causing the infection.  Rarely, sinusitis is caused by a fungal infection. In theses cases, your caregiver will prescribe antifungal medicine. For some cases of chronic sinusitis, surgery is needed. Generally, these are cases in which sinusitis recurs more than 3 times per year, despite other treatments. HOME CARE INSTRUCTIONS   Drink plenty of water. Water helps thin the mucus so your sinuses can drain more easily.  Use a humidifier.  Inhale steam 3 to 4 times a day (for example, sit in the bathroom with the shower running).  Apply a warm, moist washcloth to your face  3 to 4 times a day, or as directed by your caregiver.  Use saline nasal sprays to help moisten and clean your sinuses.  Take over-the-counter or prescription medicines for pain, discomfort, or fever only as directed by your caregiver. SEEK IMMEDIATE MEDICAL CARE IF:  You have increasing pain or  severe headaches.  You have nausea, vomiting, or drowsiness.  You have swelling around your face.  You have vision problems.  You have a stiff neck.  You have difficulty breathing. MAKE SURE YOU:   Understand these instructions.  Will watch your condition.  Will get help right away if you are not doing well or get worse. Document Released: 07/04/2005 Document Revised: 09/26/2011 Document Reviewed: 07/19/2011 [employer] Patient Information 2014 [employer].   

## 2016-04-15 NOTE — Progress Notes (Signed)
Patient presents to clinic today c/o 2 weeks of sinus pressure, sinus pain, ear pressure/pain with PND and sore throat. Endorses non-productive cough with symptoms. Denies fever, chills. Endorses last weekend, symptoms began improving but have since acutely worsened. Has tried multiple OTC medications without much improvement in symptoms. Has noted some positional lightheadedness with standing quickly. Endorses malaise/fatigue.   Past Medical History:  Diagnosis Date  . Abdominal pain 03/18/2016  . Allergy   . Anemia    in the past  . Anxiety   . Anxiety disorder   . Breast lesion 12/06/2015  . Chicken pox   . Complication of anesthesia 1999   urinary retention  . Depression    in teh past- ok now  . Diarrhea 03/18/2016  . Fibromyalgia   . GERD (gastroesophageal reflux disease)   . Hyperglycemia 03/18/2016  . Hyperlipidemia 08/07/2015  . Left thyroid nodule 03/31/2013  . Melanoma ([place])    right hip and knee  . Migraine   . Neck pain on right side 12/23/2012  . Obesity   . Pain 03/31/2013  . Palpitations 08/10/2014  . Shortness of breath    when anemic  . Sinusitis 08/07/2015  . Skin lesion of breast 12/06/2015  . TIA (transient ischemic attack) 03/31/2013  . UTI (lower urinary tract infection)     Current Outpatient Prescriptions on File Prior to Visit  Medication Sig Dispense Refill  . azelastine (ASTELIN) 0.1 % nasal spray Place 2 sprays into both nostrils 2 (two) times daily. Use in each nostril as directed 30 mL 12  . carisoprodol (SOMA) 350 MG tablet Take 1 tablet (350 mg total) by mouth 2 (two) times daily as needed for muscle spasms. 60 tablet 1  . diazepam (VALIUM) 10 MG tablet Take 1 tablet (10 mg total) by mouth every 12 (twelve) hours as needed for anxiety or sleep. 40 tablet 3  . gabapentin (NEURONTIN) 300 MG capsule 1 tab by mouth twice daily for 4 days then increase to TID (Patient taking differently: Take 300 mg by mouth 3 (three) times daily. 1 tab by mouth twice daily  for 4 days then increase to TID) 90 capsule 0  . HYDROcodone-acetaminophen (NORCO) 10-325 MG tablet Take 1 tablet by mouth every 8 (eight) hours as needed. 60 tablet 0  . hyoscyamine (LEVSIN, ANASPAZ) 0.125 MG tablet Take 1 tablet (0.125 mg total) by mouth every 4 (four) hours as needed. 30 tablet 0  . levocetirizine (XYZAL) 5 MG tablet Take 1 tablet (5 mg total) by mouth every evening. 90 tablet 1  . pantoprazole (PROTONIX) 40 MG tablet TAKE 1 TABLET (40 MG TOTAL) BY MOUTH 2 TIMES DAILY. 180 tablet 2  . venlafaxine XR (EFFEXOR XR) 150 MG 24 hr capsule Take 1 capsule (150 mg total) by mouth daily with breakfast. 30 capsule 2  . Vitamin D, Ergocalciferol, (DRISDOL) 50000 units CAPS capsule Take 1 capsule (50,000 Units total) by mouth every 7 (seven) days. 4 capsule 4   No current facility-administered medications on file prior to visit.     Allergies  Allergen Reactions  . Adhesive [Tape] Dermatitis    Band- Aid adhesive  . Aleve [Naproxen Sodium]     Makes hands, legs, and feet swell  . Carbamazepine     Hallucinations and dizziness    Family History  Problem Relation Age of Onset  . Colon cancer Neg Hx   . Colitis Father     crohn's  . Heart disease Father  grandfather  . Heart attack Father 46  . Arthritis Father     s/p back surgery  . Diabetes      grandfather  . Irritable bowel syndrome    . Kidney disease      grandfather  . Arthritis Other   . Cancer Other     breast  . Hyperlipidemia Other   . Mental illness Other   . Other Mother     neurological autoimmune disease, chorea in trunk  . Emphysema Maternal Uncle   . Arthritis Maternal Grandmother   . Lupus Maternal Grandmother   . Dementia Maternal Grandmother   . CVA Maternal Grandmother   . Arthritis Paternal Grandmother   . Heart disease Paternal Grandmother     chf  . Diabetes Paternal Grandfather   . Kidney disease Paternal Grandfather   . Hypertension Paternal Grandfather   . Hyperlipidemia  Paternal Grandfather   . Cancer Maternal Grandfather     bone    Social History   Social History  . Marital status: Divorced    Spouse name: N/A  . Number of children: N/A  . Years of education: N/A   Occupational History  . unemployed    Social History Main Topics  . Smoking status: Never Smoker  . Smokeless tobacco: Never Used     Comment: never used tobacco  . Alcohol use No  . Drug use: No  . Sexual activity: Not Currently    Birth control/ protection: None   Other Topics Concern  . None   Social History Narrative   Daily caffeine   Review of Systems - See HPI.  All other ROS are negative.  BP 108/88 (BP Location: Right Arm, Patient Position: Sitting, Cuff Size: Large)   Pulse 83   Temp 98.2 F (36.8 C) (Oral)   Resp 18   Ht _0  (1.727 m)   Wt (!) 308 lb (139.7 kg)   LMP 08/13/2012   SpO2 99%   BMI 46.83 kg/m    Physical Exam  Constitutional: She is oriented to person, place, and time and well-developed, well-nourished, and in no distress.  HENT:  Head: Normocephalic and atraumatic.  Right Ear: Tympanic membrane normal.  Left Ear: Tympanic membrane normal.  Nose: Right sinus exhibits maxillary sinus tenderness. Left sinus exhibits maxillary sinus tenderness.  Mouth/Throat: Uvula is midline, oropharynx is clear and moist and mucous membranes are normal.  Eyes: Conjunctivae are normal.  Neck: Neck supple.  Cardiovascular: Normal rate, regular rhythm, normal heart sounds and intact distal pulses.   Pulmonary/Chest: Effort normal and breath sounds normal. No respiratory distress. She has no wheezes. She has no rales. She exhibits no tenderness.  Neurological: She is alert and oriented to person, place, and time.  Skin: Skin is warm and dry. No rash noted.  Psychiatric: Affect normal.  Vitals reviewed.   Recent Results (from the past 2160 hour(s))  Sed Rate (ESR)     Status: Abnormal   Collection Time: 01/22/16 12:46 PM  Result Value Ref Range   Sed  Rate 28 (H) 0 - 20 mm/hr  Rheumatoid Factor     Status: None   Collection Time: 01/22/16 12:46 PM  Result Value Ref Range   Rhuematoid fact SerPl-aCnc <10 <=14 IU/mL    Comment:                            Interpretive Table  Low Positive: 15 - 41 IU/mL                     High Positive:  >= 42 IU/mL    In addition to the RF result, and clinical symptoms including joint  involvement, the 2010 ACR Classification Criteria for  scoring/diagnosing Rheumatoid Arthritis include the results of the  following tests:  CRP (16109), ESR (15010), and CCP (APCA) (60454).  www.rheumatology.org/practice/clinical/classification/ra/ra_2010.asp   Vitamin D (25 hydroxy)     Status: Abnormal   Collection Time: 01/22/16 12:46 PM  Result Value Ref Range   VITD 26.99 (L) 30.00 - 100.00 ng/mL  CK (Creatine Kinase)     Status: None   Collection Time: 01/22/16 12:46 PM  Result Value Ref Range   Total CK 43 7 - 177 U/L  C-reactive protein     Status: None   Collection Time: 01/22/16 12:46 PM  Result Value Ref Range   CRP 1.3 0.5 - 20.0 mg/dL  CBC with Differential     Status: Abnormal   Collection Time: 01/22/16 12:46 PM  Result Value Ref Range   WBC 10.8 (H) 4.0 - 10.5 K/uL   RBC 4.75 3.87 - 5.11 Mil/uL   Hemoglobin 14.6 12.0 - 15.0 g/dL   HCT 43.3 36.0 - 46.0 %   MCV 91.2 78.0 - 100.0 fl   MCHC 33.6 30.0 - 36.0 g/dL   RDW 12.8 11.5 - 15.5 %   Platelets 295.0 150.0 - 400.0 K/uL   Neutrophils Relative % 77.6 (H) 43.0 - 77.0 %   Lymphocytes Relative 16.6 12.0 - 46.0 %   Monocytes Relative 4.9 3.0 - 12.0 %   Eosinophils Relative 0.4 0.0 - 5.0 %   Basophils Relative 0.5 0.0 - 3.0 %   Neutro Abs 8.4 (H) 1.4 - 7.7 K/uL   Lymphs Abs 1.8 0.7 - 4.0 K/uL   Monocytes Absolute 0.5 0.1 - 1.0 K/uL   Eosinophils Absolute 0.0 0.0 - 0.7 K/uL   Basophils Absolute 0.1 0.0 - 0.1 K/uL  Ferritin     Status: None   Collection Time: 01/22/16 12:46 PM  Result Value Ref Range   Ferritin 127.0 10.0  - 291.0 ng/mL  Comp Met (CMET)     Status: None   Collection Time: 01/22/16 12:46 PM  Result Value Ref Range   Sodium 137 135 - 145 mEq/L   Potassium 3.6 3.5 - 5.1 mEq/L   Chloride 104 96 - 112 mEq/L   CO2 26 19 - 32 mEq/L   Glucose, Bld 95 70 - 99 mg/dL   BUN 14 6 - 23 mg/dL   Creatinine, Ser 0.77 0.40 - 1.20 mg/dL   Total Bilirubin 0.6 0.2 - 1.2 mg/dL   Alkaline Phosphatase 86 39 - 117 U/L   AST 18 0 - 37 U/L   ALT 24 0 - 35 U/L   Total Protein 7.4 6.0 - 8.3 g/dL   Albumin 4.3 3.5 - 5.2 g/dL   Calcium 9.4 8.4 - 10.5 mg/dL   GFR 85.95 >60.00 mL/min  TSH     Status: None   Collection Time: 01/22/16 12:46 PM  Result Value Ref Range   TSH 2.82 0.35 - 4.50 uIU/mL  Estrogens, Total     Status: Abnormal   Collection Time: 01/22/16 12:46 PM  Result Value Ref Range   Estrogen 646.1 (H) pg/mL    Comment:   Reference Ranges for Total Estrogen:     Follicular Phase        (  1-12 days):  90-590 pg/mL   Luteal Phase:     130-460 pg/mL   Postmenopausal:    50-170 pg/mL   The total estrogen assay is not recommended for use in pre-pubertal children.   Progesterone     Status: None   Collection Time: 01/22/16 12:46 PM  Result Value Ref Range   Progesterone <0.5 ng/mL    Comment: Reference Range Female  Follicular Phase <1.5 ng/mL  Luteal Phase 2.6-21.5 ng/mL  Postmenopausal  <0.5 ng/mL Pregnancy  First Trimester 4.1-34.0 ng/mL  Second Trimester 24.0-76.0 ng/mL  Third Trimester 52.0-302.0 ng/mL     Urine culture     Status: None   Collection Time: 03/18/16 12:38 PM  Result Value Ref Range   Organism ID, Bacteria Three or more organisms present,each greater than    Organism ID, Bacteria 10,000 CFU/mL.These organisms,commonly found on    Organism ID, Bacteria external and internal genitalia,are considered to    Organism ID, Bacteria be colonizers.No further testing performed.   Urinalysis     Status: None   Collection Time: 03/18/16 12:38 PM  Result Value Ref Range   Color,  Urine YELLOW Yellow;Lt. Yellow   APPearance CLEAR Clear   Specific Gravity, Urine 1.020 1.000 - 1.030   pH 5.5 5.0 - 8.0   Total Protein, Urine NEGATIVE Negative   Urine Glucose NEGATIVE Negative   Ketones, ur NEGATIVE Negative   Bilirubin Urine NEGATIVE Negative   Hgb urine dipstick NEGATIVE Negative   Urobilinogen, UA 0.2 0.0 - 1.0   Leukocytes, UA NEGATIVE Negative   Nitrite NEGATIVE Negative  Comprehensive metabolic panel     Status: None   Collection Time: 03/18/16 12:38 PM  Result Value Ref Range   Sodium 138 135 - 145 mEq/L   Potassium 3.8 3.5 - 5.1 mEq/L   Chloride 103 96 - 112 mEq/L   CO2 26 19 - 32 mEq/L   Glucose, Bld 88 70 - 99 mg/dL   BUN 10 6 - 23 mg/dL   Creatinine, Ser 0.64 0.40 - 1.20 mg/dL   Total Bilirubin 0.5 0.2 - 1.2 mg/dL   Alkaline Phosphatase 84 39 - 117 U/L   AST 15 0 - 37 U/L   ALT 25 0 - 35 U/L   Total Protein 7.4 6.0 - 8.3 g/dL   Albumin 4.1 3.5 - 5.2 g/dL   Calcium 9.0 8.4 - 10.5 mg/dL   GFR 106.32 >60.00 mL/min  CBC w/Diff     Status: Abnormal   Collection Time: 03/18/16 12:38 PM  Result Value Ref Range   WBC 10.0 4.0 - 10.5 K/uL   RBC 4.91 3.87 - 5.11 Mil/uL   Hemoglobin 15.2 (H) 12.0 - 15.0 g/dL   HCT 44.7 36.0 - 46.0 %   MCV 91.0 78.0 - 100.0 fl   MCHC 34.0 30.0 - 36.0 g/dL   RDW 13.3 11.5 - 15.5 %   Platelets 247.0 150.0 - 400.0 K/uL   Neutrophils Relative % 75.6 43.0 - 77.0 %   Lymphocytes Relative 18.5 12.0 - 46.0 %   Monocytes Relative 4.8 3.0 - 12.0 %   Eosinophils Relative 0.7 0.0 - 5.0 %   Basophils Relative 0.4 0.0 - 3.0 %   Neutro Abs 7.6 1.4 - 7.7 K/uL   Lymphs Abs 1.8 0.7 - 4.0 K/uL   Monocytes Absolute 0.5 0.1 - 1.0 K/uL   Eosinophils Absolute 0.1 0.0 - 0.7 K/uL   Basophils Absolute 0.0 0.0 - 0.1 K/uL  Sed Rate (ESR)  Status: Abnormal   Collection Time: 03/18/16 12:38 PM  Result Value Ref Range   Sed Rate 25 (H) 0 - 20 mm/hr  Hemoglobin A1c     Status: None   Collection Time: 03/18/16 12:38 PM  Result Value Ref  Range   Hgb A1c MFr Bld 5.3 4.6 - 6.5 %    Comment: Glycemic Control Guidelines for People with Diabetes:Non Diabetic:  <6%Goal of Therapy: <7%Additional Action Suggested:  >8%   H. pylori antibody, IgG     Status: None   Collection Time: 03/18/16 12:38 PM  Result Value Ref Range   H Pylori IgG Negative Negative    Assessment/Plan: 1. Acute bacterial sinusitis Rx Augmentin.  Increase fluids.  Rest.  Saline nasal spray.  Probiotic.  Mucinex as directed.  Humidifier in bedroom. Flonase restarted.  Call or return to clinic if symptoms are not improving.  - amoxicillin-clavulanate (AUGMENTIN) 875-125 MG tablet; Take 1 tablet by mouth 2 (two) times daily.  Dispense: 14 tablet; Refill: 0 - fluconazole (DIFLUCAN) 150 MG tablet; Take 1 tablet (150 mg total) by mouth once. May repeat in 3 days if needed  Dispense: 2 tablet; Refill: 0   Leeanne Rio, [place]

## 2016-04-19 ENCOUNTER — Other Ambulatory Visit: Payer: Self-pay | Admitting: Family

## 2016-04-20 MED ORDER — GABAPENTIN 300 MG PO CAPS: 300.0000 mg | ORAL_CAPSULE | Freq: Three times a day (TID) | ORAL | 5 refills | Status: DC

## 2016-05-13 ENCOUNTER — Encounter: Payer: Self-pay | Admitting: Physician Assistant

## 2016-05-13 ENCOUNTER — Telehealth: Payer: BLUE CROSS/BLUE SHIELD | Admitting: Family

## 2016-05-13 DIAGNOSIS — B9689 Other specified bacterial agents as the cause of diseases classified elsewhere: Secondary | ICD-10-CM

## 2016-05-13 DIAGNOSIS — J329 Chronic sinusitis, unspecified: Secondary | ICD-10-CM

## 2016-05-13 MED ORDER — DOXYCYCLINE HYCLATE 100 MG PO TABS: 100.0000 mg | ORAL_TABLET | Freq: Two times a day (BID) | ORAL | 0 refills | Status: DC

## 2016-05-13 MED ORDER — FLUCONAZOLE 150 MG PO TABS: 150.0000 mg | ORAL_TABLET | Freq: Once | ORAL | 0 refills | Status: AC

## 2016-05-13 NOTE — Progress Notes (Signed)

## 2016-05-23 ENCOUNTER — Ambulatory Visit (INDEPENDENT_AMBULATORY_CARE_PROVIDER_SITE_OTHER): Payer: BLUE CROSS/BLUE SHIELD | Admitting: Family Medicine

## 2016-05-23 VITALS — BP 122/90 | HR 84 | Temp 98.2°F | Wt 313.2 lb

## 2016-05-23 DIAGNOSIS — F3130 Bipolar disorder, current episode depressed, mild or moderate severity, unspecified: Secondary | ICD-10-CM

## 2016-05-23 DIAGNOSIS — R700 Elevated erythrocyte sedimentation rate: Secondary | ICD-10-CM

## 2016-05-23 DIAGNOSIS — E781 Pure hyperglyceridemia: Secondary | ICD-10-CM | POA: Diagnosis not present

## 2016-05-23 DIAGNOSIS — R52 Pain, unspecified: Secondary | ICD-10-CM

## 2016-05-23 DIAGNOSIS — D509 Iron deficiency anemia, unspecified: Secondary | ICD-10-CM

## 2016-05-23 DIAGNOSIS — R197 Diarrhea, unspecified: Secondary | ICD-10-CM | POA: Diagnosis not present

## 2016-05-23 DIAGNOSIS — G4459 Other complicated headache syndrome: Secondary | ICD-10-CM | POA: Insufficient documentation

## 2016-05-23 DIAGNOSIS — R739 Hyperglycemia, unspecified: Secondary | ICD-10-CM

## 2016-05-23 DIAGNOSIS — D649 Anemia, unspecified: Secondary | ICD-10-CM

## 2016-05-23 DIAGNOSIS — R5081 Fever presenting with conditions classified elsewhere: Secondary | ICD-10-CM

## 2016-05-23 DIAGNOSIS — R21 Rash and other nonspecific skin eruption: Secondary | ICD-10-CM | POA: Insufficient documentation

## 2016-05-23 DIAGNOSIS — E559 Vitamin D deficiency, unspecified: Secondary | ICD-10-CM | POA: Diagnosis not present

## 2016-05-23 DIAGNOSIS — R002 Palpitations: Secondary | ICD-10-CM

## 2016-05-23 DIAGNOSIS — R1084 Generalized abdominal pain: Secondary | ICD-10-CM

## 2016-05-23 DIAGNOSIS — F319 Bipolar disorder, unspecified: Secondary | ICD-10-CM

## 2016-05-23 DIAGNOSIS — M791 Myalgia, unspecified site: Secondary | ICD-10-CM

## 2016-05-23 DIAGNOSIS — N951 Menopausal and female climacteric states: Secondary | ICD-10-CM | POA: Diagnosis not present

## 2016-05-23 DIAGNOSIS — R509 Fever, unspecified: Secondary | ICD-10-CM | POA: Insufficient documentation

## 2016-05-23 MED ORDER — VENLAFAXINE HCL ER 150 MG PO CP24: 150.0000 mg | ORAL_CAPSULE | Freq: Every day | ORAL | 2 refills | Status: DC

## 2016-05-23 MED ORDER — CARISOPRODOL 350 MG PO TABS
350.0000 mg | ORAL_TABLET | Freq: Two times a day (BID) | ORAL | 1 refills | Status: DC | PRN
Start: 2016-05-23 — End: 2016-06-20

## 2016-05-23 MED ORDER — QUETIAPINE FUMARATE 100 MG PO TABS: 100.0000 mg | ORAL_TABLET | Freq: Every day | ORAL | 1 refills | Status: DC

## 2016-05-23 MED ORDER — HYDROCODONE-ACETAMINOPHEN 10-325 MG PO TABS: 1.0000 | ORAL_TABLET | Freq: Three times a day (TID) | ORAL | 0 refills | Status: DC | PRN

## 2016-05-23 MED FILL — HYDROCODON-APAP 10-325: 10-325 | 20 days supply | Qty: 60 | Fill #0

## 2016-05-23 MED FILL — QUETIAPINE FUMARATE 100 MG: 100 | 30 days supply | Qty: 90 | Fill #0

## 2016-05-23 MED FILL — CARISOPRODOL 350 MG TABLET: 350 | 30 days supply | Qty: 60 | Fill #0

## 2016-05-23 NOTE — Progress Notes (Signed)
Patient ID: [name], female   DOB: [birth year], 45 y.o.   MRN: [id number]   Subjective:    Patient ID: [name], female    DOB: 16-Dec-1970, 45 y.o.   MRN: [id number]  Chief Complaint  Patient presents with  . Medication Problem    tapering off medication    HPI Patient is in today for follow up with numerous complaints. Is accompanied by her husband. She reports feeling poorly for several weeks now. She has recently had antibiotics for sinusitis and the fevers have resolved but she still has facial pressure and congestion along with cough. Also notes fatigue, malaise, myalgias. Also notes some intermittent diarrhea vs constipation. Denies CP/palp/SOB/HA or GU c/o. Taking meds as prescribed  Past Medical History:  Diagnosis Date  . Abdominal pain 03/18/2016  . Allergy   . Anemia    in the past  . Anxiety   . Anxiety disorder   . Bipolar depression ([place]) 05/29/2016  . Breast lesion 12/06/2015  . Chicken pox   . Complication of anesthesia 1999   urinary retention  . Depression    in teh past- ok now  . Diarrhea 03/18/2016  . Fibromyalgia   . GERD (gastroesophageal reflux disease)   . Hyperglycemia 03/18/2016  . Hyperlipidemia 08/07/2015  . Left thyroid nodule 03/31/2013  . Melanoma ([place])    right hip and knee  . Migraine   . Neck pain on right side 12/23/2012  . Obesity   . Pain 03/31/2013  . Palpitations 08/10/2014  . Shortness of breath    when anemic  . Sinusitis 08/07/2015  . Skin lesion of breast 12/06/2015  . TIA (transient ischemic attack) 03/31/2013  . UTI (lower urinary tract infection)     Past Surgical History:  Procedure Laterality Date  . ABDOMINAL HYSTERECTOMY    . BACK SURGERY    . BILATERAL SALPINGECTOMY Bilateral 09/13/2012   Procedure: BILATERAL SALPINGECTOMY;  Surgeon: Lavonia Drafts, MD;  Location: [facility];  Service: Gynecology;  Laterality: Bilateral;  . CHOLECYSTECTOMY    . COLONOSCOPY    . LAPAROSCOPIC ASSISTED VAGINAL HYSTERECTOMY  N/A 09/13/2012   Procedure: LAPAROSCOPIC ASSISTED VAGINAL HYSTERECTOMY;  Surgeon: Lavonia Drafts, MD;  Location: [facility];  Service: Gynecology;  Laterality: N/A;  . MELANOMA EXCISION  2011   stage I and II  . WISDOM TOOTH EXTRACTION  1989    Family History  Problem Relation Age of Onset  . Colon cancer Neg Hx   . Colitis Father     crohn's  . Heart disease Father     grandfather  . Heart attack Father 101  . Arthritis Father     s/p back surgery  . Diabetes      grandfather  . Irritable bowel syndrome    . Kidney disease      grandfather  . Arthritis Other   . Cancer Other     breast  . Hyperlipidemia Other   . Mental illness Other   . Other Mother     neurological autoimmune disease, chorea in trunk  . Emphysema Maternal Uncle   . Arthritis Maternal Grandmother   . Lupus Maternal Grandmother   . Dementia Maternal Grandmother   . CVA Maternal Grandmother   . Arthritis Paternal Grandmother   . Heart disease Paternal Grandmother     chf  . Diabetes Paternal Grandfather   . Kidney disease Paternal Grandfather   . Hypertension Paternal Grandfather   . Hyperlipidemia Paternal Grandfather   . Cancer  Maternal Grandfather     bone    Social History   Social History  . Marital status: Divorced    Spouse name: N/A  . Number of children: N/A  . Years of education: N/A   Occupational History  . unemployed    Social History Main Topics  . Smoking status: Never Smoker  . Smokeless tobacco: Never Used     Comment: never used tobacco  . Alcohol use No  . Drug use: No  . Sexual activity: Not Currently    Birth control/ protection: None   Other Topics Concern  . Not on file   Social History Narrative   Daily caffeine    Outpatient Medications Prior to Visit  Medication Sig Dispense Refill  . amoxicillin-clavulanate (AUGMENTIN) 875-125 MG tablet Take 1 tablet by mouth 2 (two) times daily. 14 tablet 0  . azelastine (ASTELIN) 0.1 % nasal spray Place 2 sprays  into both nostrils 2 (two) times daily. Use in each nostril as directed 30 mL 12  . diazepam (VALIUM) 10 MG tablet Take 1 tablet (10 mg total) by mouth every 12 (twelve) hours as needed for anxiety or sleep. 40 tablet 3  . doxycycline (VIBRA-TABS) 100 MG tablet Take 1 tablet (100 mg total) by mouth 2 (two) times daily. 20 tablet 0  . gabapentin (NEURONTIN) 300 MG capsule Take 1 capsule (300 mg total) by mouth 3 (three) times daily. 90 capsule 5  . hyoscyamine (LEVSIN, ANASPAZ) 0.125 MG tablet Take 1 tablet (0.125 mg total) by mouth every 4 (four) hours as needed. 30 tablet 0  . levocetirizine (XYZAL) 5 MG tablet Take 1 tablet (5 mg total) by mouth every evening. 90 tablet 1  . pantoprazole (PROTONIX) 40 MG tablet TAKE 1 TABLET (40 MG TOTAL) BY MOUTH 2 TIMES DAILY. 180 tablet 2  . Vitamin D, Ergocalciferol, (DRISDOL) 50000 units CAPS capsule Take 1 capsule (50,000 Units total) by mouth every 7 (seven) days. 4 capsule 4  . carisoprodol (SOMA) 350 MG tablet Take 1 tablet (350 mg total) by mouth 2 (two) times daily as needed for muscle spasms. 60 tablet 1  . HYDROcodone-acetaminophen (NORCO) 10-325 MG tablet Take 1 tablet by mouth every 8 (eight) hours as needed. 60 tablet 0  . venlafaxine XR (EFFEXOR XR) 150 MG 24 hr capsule Take 1 capsule (150 mg total) by mouth daily with breakfast. 30 capsule 2   No facility-administered medications prior to visit.     Allergies  Allergen Reactions  . Adhesive [Tape] Dermatitis    Band- Aid adhesive  . Aleve [Naproxen Sodium]     Makes hands, legs, and feet swell  . Carbamazepine     Hallucinations and dizziness    Review of Systems  Constitutional: Positive for chills, fever and malaise/fatigue.  HENT: Positive for congestion.        Dull tympanic  Eyes: Negative for blurred vision.  Respiratory: Negative for cough and shortness of breath.   Cardiovascular: Negative for chest pain and palpitations.  Gastrointestinal: Positive for abdominal pain,  constipation and diarrhea. Negative for blood in stool, melena and vomiting.  Musculoskeletal: Positive for back pain, joint pain and myalgias.  Skin: Negative for rash.  Neurological: Positive for weakness. Negative for loss of consciousness and headaches.  Psychiatric/Behavioral: Positive for depression. Negative for hallucinations. The patient is nervous/anxious and has insomnia.        Objective:    Physical Exam  Constitutional: She is oriented to person, place, and time. She appears  well-developed and well-nourished. No distress.  HENT:  Head: Normocephalic and atraumatic.  Eyes: Conjunctivae are normal.  Neck: Normal range of motion. No thyromegaly present.  Cardiovascular: Normal rate and regular rhythm.   Pulmonary/Chest: Effort normal and breath sounds normal. She has no wheezes.  Abdominal: Soft. Bowel sounds are normal. There is no tenderness.  Musculoskeletal: Normal range of motion. She exhibits no edema or deformity.  Neurological: She is alert and oriented to person, place, and time.  Skin: Skin is warm and dry. She is not diaphoretic.  Psychiatric: She has a normal mood and affect.    BP 122/90 (BP Location: Right Arm, Patient Position: Sitting, Cuff Size: Large)   Pulse 84   Temp 98.2 F (36.8 C) (Oral)   Wt (!) 313 lb 3.2 oz (142.1 kg)   LMP 08/13/2012   SpO2 97%   BMI 47.62 kg/m  Wt Readings from Last 3 Encounters:  05/23/16 (!) 313 lb 3.2 oz (142.1 kg)  04/15/16 (!) 308 lb (139.7 kg)  03/18/16 (!) 309 lb 6.4 oz (140.3 kg)     Lab Results  Component Value Date   WBC 11.1 (H) 05/23/2016   HGB 13.8 05/23/2016   HCT 40.9 05/23/2016   PLT 235.0 05/23/2016   GLUCOSE 73 05/23/2016   CHOL 219 (A) 07/28/2014   TRIG 194 (H) 09/26/2013   HDL 38 (L) 09/26/2013   LDLCALC 114 (H) 09/26/2013   ALT 27 05/23/2016   AST 20 05/23/2016   NA 137 05/23/2016   K 4.1 05/23/2016   CL 103 05/23/2016   CREATININE 0.69 05/23/2016   BUN 14 05/23/2016   CO2 25  05/23/2016   TSH 1.28 05/23/2016   INR 0.98 09/03/2010   HGBA1C 5.3 03/18/2016    Lab Results  Component Value Date   TSH 1.28 05/23/2016   Lab Results  Component Value Date   WBC 11.1 (H) 05/23/2016   HGB 13.8 05/23/2016   HCT 40.9 05/23/2016   MCV 91.9 05/23/2016   PLT 235.0 05/23/2016   Lab Results  Component Value Date   NA 137 05/23/2016   K 4.1 05/23/2016   CO2 25 05/23/2016   GLUCOSE 73 05/23/2016   BUN 14 05/23/2016   CREATININE 0.69 05/23/2016   BILITOT 0.4 05/23/2016   ALKPHOS 80 05/23/2016   AST 20 05/23/2016   ALT 27 05/23/2016   PROT 7.2 05/23/2016   ALBUMIN 4.0 05/23/2016   CALCIUM 9.3 05/23/2016   GFR 97.40 05/23/2016   Lab Results  Component Value Date   CHOL 219 (A) 07/28/2014   Lab Results  Component Value Date   HDL 38 (L) 09/26/2013   Lab Results  Component Value Date   LDLCALC 114 (H) 09/26/2013   Lab Results  Component Value Date   TRIG 194 (H) 09/26/2013   Lab Results  Component Value Date   CHOLHDL 5.0 09/26/2013   Lab Results  Component Value Date   HGBA1C 5.3 03/18/2016       Assessment & Plan:   Problem List Items Addressed This Visit    Iron deficiency anemia   Hypertriglyceridemia   Relevant Orders   TSH (Completed)   Pain   Palpitations   Perimenopausal    Hormone levels still WNL      Relevant Orders   Estrogens, Total (Completed)   Progesterone (Completed)   Vitamin D deficiency    Continue supplements      Myalgia - Primary   Relevant Orders   TSH (Completed)  Comp Met (CMET) (Completed)   Magnesium (Completed)   Ehrlichia antibody panel (Completed)   Rocky mtn spotted fvr abs pnl(IgG+IgM) (Completed)   Lyme Ab/Western Blot Reflex (Completed)   Hyperglycemia   Relevant Orders   TSH (Completed)   Diarrhea    Avoid offending foods, start probiotics. Add fiber supplements      Relevant Orders   Comp Met (CMET) (Completed)   T4, free (Completed)   Abdominal pain    Labs unremarkable        Relevant Orders   TSH (Completed)   Comp Met (CMET) (Completed)   Fever    Lyme and RMSF negative and no further fevers since Doxycycline.       Relevant Orders   Comp Met (CMET) (Completed)   T4, free (Completed)   Ehrlichia antibody panel (Completed)   Rocky mtn spotted fvr abs pnl(IgG+IgM) (Completed)   Lyme Ab/Western Blot Reflex (Completed)   Other complicated headache syndrome   Relevant Medications   carisoprodol (SOMA) 350 MG tablet   HYDROcodone-acetaminophen (NORCO) 10-325 MG tablet   venlafaxine XR (EFFEXOR XR) 150 MG 24 hr capsule   Other Relevant Orders   T4, free (Completed)   Ehrlichia antibody panel (Completed)   Rocky mtn spotted fvr abs pnl(IgG+IgM) (Completed)   Lyme Ab/Western Blot Reflex (Completed)   Rash and nonspecific skin eruption   Relevant Orders   Ehrlichia antibody panel (Completed)   Rocky mtn spotted fvr abs pnl(IgG+IgM) (Completed)   Lyme Ab/Western Blot Reflex (Completed)   Elevated sed rate    Repeat level      Relevant Orders   Sed Rate (ESR) (Completed)   Bipolar depression ([facility])    Screen today. Continue Venlafaxine but add Seroquel qhs and titrate up to 300 mg       Relevant Medications   venlafaxine XR (EFFEXOR XR) 150 MG 24 hr capsule    Other Visit Diagnoses    Anemia, unspecified type       Relevant Orders   Comp Met (CMET) (Completed)   CBC with Differential (Completed)      I am having Ms. Cangemi start on QUEtiapine. I am also having her maintain her diazepam, azelastine, Vitamin D (Ergocalciferol), pantoprazole, levocetirizine, hyoscyamine, amoxicillin-clavulanate, gabapentin, doxycycline, carisoprodol, HYDROcodone-acetaminophen, and venlafaxine XR.  Meds ordered this encounter  Medications  . DISCONTD: venlafaxine XR (EFFEXOR-XR) 75 MG 24 hr capsule    Sig: Take 1 capsule by mouth daily.    Refill:  3  . carisoprodol (SOMA) 350 MG tablet    Sig: Take 1 tablet (350 mg total) by mouth 2 (two) times daily as  needed for muscle spasms.    Dispense:  60 tablet    Refill:  1  . HYDROcodone-acetaminophen (NORCO) 10-325 MG tablet    Sig: Take 1 tablet by mouth every 8 (eight) hours as needed.    Dispense:  60 tablet    Refill:  0  . venlafaxine XR (EFFEXOR XR) 150 MG 24 hr capsule    Sig: Take 1 capsule (150 mg total) by mouth daily with breakfast.    Dispense:  30 capsule    Refill:  2  . QUEtiapine (SEROQUEL) 100 MG tablet    Sig: Take 1 tablet (100 mg total) by mouth at bedtime. Take 1/2 tablet at bedtime for a week, Then take 1 tablet at bedtime for one week, Then take 2 tablets at bedtime for one week, Then take 3 tablets at bedtime.    Dispense:  90 tablet  Refill:  1     Penni Homans, MD

## 2016-05-23 NOTE — Progress Notes (Signed)
Pre visit review using our clinic review tool, if applicable. No additional management support is needed unless otherwise documented below in the visit note. 

## 2016-05-23 NOTE — Patient Instructions (Signed)
Bipolar Disorder Bipolar disorder is a mental illness. The term bipolar disorder actually is used to describe a group of disorders that all share varying degrees of emotional highs and lows that can interfere with daily functioning, such as work, school, or relationships. Bipolar disorder also can lead to drug abuse, hospitalization, and suicide. The emotional highs of bipolar disorder are periods of elation or irritability and high energy. These highs can range from a mild form (hypomania) to a severe form (mania). People experiencing episodes of hypomania may appear energetic, excitable, and highly productive. People experiencing mania may behave impulsively or erratically. They often make poor decisions. They may have difficulty sleeping. The most severe episodes of mania can involve having very distorted beliefs or perceptions about the world and seeing or hearing things that are not real (psychotic delusions and hallucinations).  The emotional lows of bipolar disorder (depression) also can range from mild to severe. Severe episodes of bipolar depression can involve psychotic delusions and hallucinations. Sometimes people with bipolar disorder experience a state of mixed mood. Symptoms of hypomania or mania and depression are both present during this mixed-mood episode. SIGNS AND SYMPTOMS There are signs and symptoms of the episodes of hypomania and mania as well as the episodes of depression. The signs and symptoms of hypomania and mania are similar but vary in severity. They include:  Inflated self-esteem or feeling of increased self-confidence.  Decreased need for sleep.  Unusual talkativeness (rapid or pressured speech) or the feeling of a need to keep talking.  Sensation of racing thoughts or constant talking, with quick shifts between topics that may or may not be related (flight of ideas).  Decreased ability to focus or concentrate.  Increased purposeful activity, such as work, studies,  or social activity, or nonproductive activity, such as pacing, squirming and fidgeting, or finger and toe tapping.  Impulsive behavior and use of poor judgment, resulting in high-risk activities, such as having unprotected sex or spending excessive amounts of money. Signs and symptoms of depression include the following:   Feelings of sadness, hopelessness, or helplessness.  Frequent or uncontrollable episodes of crying.  Lack of feeling anything or caring about anything.  Difficulty sleeping or sleeping too much.  Inability to enjoy the things you used to enjoy.   Desire to be alone all the time.   Feelings of guilt or worthlessness.  Lack of energy or motivation.   Difficulty concentrating, remembering, or making decisions.  Change in appetite or weight beyond normal fluctuations.  Thoughts of death or the desire to harm yourself. DIAGNOSIS  Bipolar disorder is diagnosed through an assessment by your caregiver. Your caregiver will ask questions about your emotional episodes. There are two main types of bipolar disorder. People with type I bipolar disorder have manic episodes with or without depressive episodes. People with type II bipolar disorder have hypomanic episodes and major depressive episodes, which are more serious than mild depression. The type of bipolar disorder you have can make an important difference in how your illness is monitored and treated. Your caregiver may ask questions about your medical history and use of alcohol or drugs, including prescription medication. Certain medical conditions and substances also can cause emotional highs and lows that resemble bipolar disorder (secondary bipolar disorder).  TREATMENT  Bipolar disorder is a long-term illness. It is best controlled with continuous treatment rather than treatment only when symptoms occur. The following treatments can be prescribed for bipolar disorders:  Medication--Medication can be prescribed by  a doctor that  is an expert in treating mental disorders (psychiatrists). Medications called mood stabilizers are usually prescribed to help control the illness. Other medications are sometimes added if symptoms of mania, depression, or psychotic delusions and hallucinations occur despite the use of a mood stabilizer. °· Talk therapy--Some forms of talk therapy are helpful in providing support, education, and guidance. °A combination of medication and talk therapy is best for managing the disorder over time. A procedure in which electricity is applied to your brain through your scalp (electroconvulsive therapy) is used in cases of severe mania when medication and talk therapy do not work or work too slowly. °  °This information is not intended to replace advice given to you by your health care provider. Make sure you discuss any questions you have with your health care provider. °  °Document Released: 10/10/2000 Document Revised: 07/25/2014 Document Reviewed: 07/30/2012 °Elsevier Interactive Patient Education ©2016 [employer] ° °

## 2016-05-24 LAB — CBC WITH DIFFERENTIAL/PLATELET
BASOS PCT: 1.7 % (ref 0.0–3.0)
Basophils Absolute: 0.2 10*3/uL — ABNORMAL HIGH (ref 0.0–0.1)
EOS PCT: 1.1 % (ref 0.0–5.0)
Eosinophils Absolute: 0.1 10*3/uL (ref 0.0–0.7)
HCT: 40.9 % (ref 36.0–46.0)
Hemoglobin: 13.8 g/dL (ref 12.0–15.0)
LYMPHS ABS: 2.3 10*3/uL (ref 0.7–4.0)
Lymphocytes Relative: 20.6 % (ref 12.0–46.0)
MCHC: 33.8 g/dL (ref 30.0–36.0)
MCV: 91.9 fl (ref 78.0–100.0)
MONOS PCT: 5 % (ref 3.0–12.0)
Monocytes Absolute: 0.6 10*3/uL (ref 0.1–1.0)
NEUTROS ABS: 7.9 10*3/uL — AB (ref 1.4–7.7)
NEUTROS PCT: 71.6 % (ref 43.0–77.0)
PLATELETS: 235 10*3/uL (ref 150.0–400.0)
RBC: 4.45 Mil/uL (ref 3.87–5.11)
RDW: 13.6 % (ref 11.5–15.5)
WBC: 11.1 10*3/uL — ABNORMAL HIGH (ref 4.0–10.5)

## 2016-05-24 LAB — PROGESTERONE: Progesterone: 5.2 ng/mL

## 2016-05-24 LAB — COMPREHENSIVE METABOLIC PANEL
ALT: 27 U/L (ref 0–35)
AST: 20 U/L (ref 0–37)
Albumin: 4 g/dL (ref 3.5–5.2)
Alkaline Phosphatase: 80 U/L (ref 39–117)
BUN: 14 mg/dL (ref 6–23)
CHLORIDE: 103 meq/L (ref 96–112)
CO2: 25 meq/L (ref 19–32)
Calcium: 9.3 mg/dL (ref 8.4–10.5)
Creatinine, Ser: 0.69 mg/dL (ref 0.40–1.20)
GFR: 97.4 mL/min (ref >60.00)
GLUCOSE: 73 mg/dL (ref 70–99)
POTASSIUM: 4.1 meq/L (ref 3.5–5.1)
Sodium: 137 mEq/L (ref 135–145)
Total Bilirubin: 0.4 mg/dL (ref 0.2–1.2)
Total Protein: 7.2 g/dL (ref 6.0–8.3)

## 2016-05-24 LAB — SEDIMENTATION RATE: Sed Rate: 33 mm/hr — ABNORMAL HIGH (ref 0–20)

## 2016-05-24 LAB — T4, FREE: Free T4: 0.75 ng/dL (ref 0.60–1.60)

## 2016-05-24 LAB — MAGNESIUM: Magnesium: 1.9 mg/dL (ref 1.5–2.5)

## 2016-05-24 LAB — TSH: TSH: 1.28 u[IU]/mL (ref 0.35–4.50)

## 2016-05-24 LAB — LYME AB/WESTERN BLOT REFLEX

## 2016-05-26 LAB — ESTROGENS, TOTAL: Estrogen: 264.1 pg/mL

## 2016-05-27 LAB — ROCKY MTN SPOTTED FVR ABS PNL(IGG+IGM)
RMSF IGM: NOT DETECTED
RMSF IgG: NOT DETECTED

## 2016-05-29 ENCOUNTER — Encounter: Payer: Self-pay | Admitting: Family Medicine

## 2016-05-29 DIAGNOSIS — R700 Elevated erythrocyte sedimentation rate: Secondary | ICD-10-CM | POA: Insufficient documentation

## 2016-05-29 DIAGNOSIS — F319 Bipolar disorder, unspecified: Secondary | ICD-10-CM

## 2016-05-29 HISTORY — DX: Bipolar disorder, unspecified: F31.9

## 2016-05-29 NOTE — Assessment & Plan Note (Signed)
Lyme and RMSF negative and no further fevers since Doxycycline.

## 2016-05-29 NOTE — Assessment & Plan Note (Signed)
Hormone levels still WNL

## 2016-05-29 NOTE — Assessment & Plan Note (Signed)
Continue supplements

## 2016-05-29 NOTE — Assessment & Plan Note (Signed)
Repeat level

## 2016-05-29 NOTE — Assessment & Plan Note (Signed)
Avoid offending foods, start probiotics. Add fiber supplements

## 2016-05-29 NOTE — Assessment & Plan Note (Signed)
Labs unremarkable.

## 2016-05-29 NOTE — Assessment & Plan Note (Signed)
Screen today. Continue Venlafaxine but add Seroquel qhs and titrate up to 300 mg

## 2016-05-31 LAB — EHRLICHIA ANTIBODY PANEL
E chaffeensis (HGE) Ab, IgG: <1:64 {titer}
E chaffeensis (HGE) Ab, IgM: <1:20 {titer}
Interpretaton: NOT DETECTED

## 2016-06-07 ENCOUNTER — Encounter: Payer: Self-pay | Admitting: Family Medicine

## 2016-06-16 ENCOUNTER — Other Ambulatory Visit: Payer: Self-pay | Admitting: Family Medicine

## 2016-06-19 ENCOUNTER — Other Ambulatory Visit: Payer: Self-pay | Admitting: Family Medicine

## 2016-06-19 ENCOUNTER — Encounter: Payer: Self-pay | Admitting: Family Medicine

## 2016-06-20 ENCOUNTER — Other Ambulatory Visit: Payer: Self-pay | Admitting: Family Medicine

## 2016-06-20 ENCOUNTER — Telehealth: Payer: Self-pay | Admitting: Family Medicine

## 2016-06-20 MED ORDER — CARISOPRODOL 350 MG PO TABS: 350.0000 mg | ORAL_TABLET | Freq: Two times a day (BID) | ORAL | 1 refills | Status: DC | PRN

## 2016-06-20 MED ORDER — HYDROCODONE-ACETAMINOPHEN 10-325 MG PO TABS: 1.0000 | ORAL_TABLET | Freq: Three times a day (TID) | ORAL | 0 refills | Status: DC | PRN

## 2016-06-20 NOTE — Telephone Encounter (Signed)
Cleaning out files - discarded RX from 12/03/15 for Hydrocodone-Homatropine 5-1.5mg  syrup Dr. Charlett Blake

## 2016-06-20 NOTE — Telephone Encounter (Signed)
Printed hydrocodone and soma prescription. PCP signed and patient contacted by Mychart to pickup at the front desk (both)

## 2016-06-24 ENCOUNTER — Ambulatory Visit: Payer: BLUE CROSS/BLUE SHIELD | Admitting: Family Medicine

## 2016-06-28 ENCOUNTER — Telehealth: Payer: Self-pay | Admitting: Family Medicine

## 2016-06-28 NOTE — Telephone Encounter (Signed)
Patient was supposed to be seen on 06/24/16 for a mediation follow up. Patient had to cancel though. Patient states she is no longer taking the QUEtiapine (SEROQUEL) 100 MG tablet which is the medication she was supposed to follow up on. She is wondering if she still needs an appointment or if she can just come in for a vit. D check. Please advise.    Patient phone: [phone]

## 2016-06-29 NOTE — Telephone Encounter (Signed)
This depends on how she is doing. If she is still struggling and we need to consider a new med she should come in, if she feels well we can just check the Vitamin D at the 3 mnth mark

## 2016-06-30 MED FILL — CARISOPRODOL 350 MG TABLET: 350 | 30 days supply | Qty: 60 | Fill #0

## 2016-06-30 MED FILL — HYDROCODON-APAP 10-325: 10-325 | 20 days supply | Qty: 60 | Fill #0

## 2016-06-30 NOTE — Telephone Encounter (Signed)
Called left message to call back 

## 2016-06-30 NOTE — Telephone Encounter (Signed)
Patient scheduled appt. On 07/05/16

## 2016-07-05 ENCOUNTER — Ambulatory Visit (INDEPENDENT_AMBULATORY_CARE_PROVIDER_SITE_OTHER): Payer: BLUE CROSS/BLUE SHIELD | Admitting: Family Medicine

## 2016-07-05 VITALS — BP 128/78 | HR 73 | Temp 98.1°F | Wt 317.6 lb

## 2016-07-05 DIAGNOSIS — E6609 Other obesity due to excess calories: Secondary | ICD-10-CM

## 2016-07-05 DIAGNOSIS — E559 Vitamin D deficiency, unspecified: Secondary | ICD-10-CM | POA: Diagnosis not present

## 2016-07-05 DIAGNOSIS — M791 Myalgia, unspecified site: Secondary | ICD-10-CM

## 2016-07-05 DIAGNOSIS — R700 Elevated erythrocyte sedimentation rate: Secondary | ICD-10-CM

## 2016-07-05 DIAGNOSIS — F3130 Bipolar disorder, current episode depressed, mild or moderate severity, unspecified: Secondary | ICD-10-CM

## 2016-07-05 DIAGNOSIS — R600 Localized edema: Secondary | ICD-10-CM

## 2016-07-05 DIAGNOSIS — M5440 Lumbago with sciatica, unspecified side: Secondary | ICD-10-CM

## 2016-07-05 DIAGNOSIS — R739 Hyperglycemia, unspecified: Secondary | ICD-10-CM | POA: Diagnosis not present

## 2016-07-05 DIAGNOSIS — D509 Iron deficiency anemia, unspecified: Secondary | ICD-10-CM | POA: Diagnosis not present

## 2016-07-05 DIAGNOSIS — E785 Hyperlipidemia, unspecified: Secondary | ICD-10-CM

## 2016-07-05 DIAGNOSIS — F319 Bipolar disorder, unspecified: Secondary | ICD-10-CM

## 2016-07-05 LAB — COMPREHENSIVE METABOLIC PANEL
ALBUMIN: 4.1 g/dL (ref 3.5–5.2)
ALK PHOS: 84 U/L (ref 39–117)
ALT: 44 U/L — ABNORMAL HIGH (ref 0–35)
AST: 26 U/L (ref 0–37)
BUN: 15 mg/dL (ref 6–23)
CHLORIDE: 101 meq/L (ref 96–112)
CO2: 27 mEq/L (ref 19–32)
Calcium: 9.2 mg/dL (ref 8.4–10.5)
Creatinine, Ser: 0.8 mg/dL (ref 0.40–1.20)
GFR: 82.08 mL/min (ref >60.00)
GLUCOSE: 99 mg/dL (ref 70–99)
POTASSIUM: 4.2 meq/L (ref 3.5–5.1)
SODIUM: 138 meq/L (ref 135–145)
TOTAL PROTEIN: 7 g/dL (ref 6.0–8.3)
Total Bilirubin: 0.5 mg/dL (ref 0.2–1.2)

## 2016-07-05 LAB — LIPID PANEL
CHOL/HDL RATIO: 5
CHOLESTEROL: 210 mg/dL — AB (ref 0–200)
HDL: 41.3 mg/dL (ref >39.00)
NonHDL: 168.45
Triglycerides: 204 mg/dL — ABNORMAL HIGH (ref 0.0–149.0)
VLDL: 40.8 mg/dL — AB (ref 0.0–40.0)

## 2016-07-05 LAB — VITAMIN D 25 HYDROXY (VIT D DEFICIENCY, FRACTURES): VITD: 30.79 ng/mL (ref 30.00–100.00)

## 2016-07-05 LAB — CBC
HEMATOCRIT: 42 % (ref 36.0–46.0)
HEMOGLOBIN: 14.2 g/dL (ref 12.0–15.0)
MCHC: 33.9 g/dL (ref 30.0–36.0)
MCV: 92.8 fl (ref 78.0–100.0)
Platelets: 279 10*3/uL (ref 150.0–400.0)
RBC: 4.52 Mil/uL (ref 3.87–5.11)
RDW: 14 % (ref 11.5–15.5)
WBC: 9.4 10*3/uL (ref 4.0–10.5)

## 2016-07-05 LAB — IBC PANEL
IRON: 82 ug/dL (ref 42–145)
Saturation Ratios: 27.8 % (ref 20.0–50.0)
Transferrin: 211 mg/dL — ABNORMAL LOW (ref 212.0–360.0)

## 2016-07-05 LAB — LDL CHOLESTEROL, DIRECT: Direct LDL: 146 mg/dL

## 2016-07-05 LAB — SEDIMENTATION RATE: Sed Rate: 21 mm/hr — ABNORMAL HIGH (ref 0–20)

## 2016-07-05 LAB — TSH: TSH: 2.54 u[IU]/mL (ref 0.35–4.50)

## 2016-07-05 MED ORDER — GABAPENTIN 300 MG PO CAPS: ORAL_CAPSULE | ORAL | 5 refills | Status: DC

## 2016-07-05 MED ORDER — ARIPIPRAZOLE 2 MG PO TABS: 2.0000 mg | ORAL_TABLET | Freq: Every day | ORAL | 1 refills | Status: DC

## 2016-07-05 NOTE — Assessment & Plan Note (Addendum)
Did not tolerate Seroquel had nightmares and vomiting. Start Abilify 2 mg next week and continue Effexor.

## 2016-07-05 NOTE — Patient Instructions (Signed)
Bipolar 1 Disorder Bipolar 1 disorder is a mental health disorder in which a person has episodes of emotional highs (mania), and may also have episodes of emotional lows (depression) in addition to highs. Bipolar 1 disorder is different from other bipolar disorders because it involves extreme manic episodes. These episodes last at least one week or involve symptoms that are so severe that hospitalization is needed to keep the person safe. What increases the risk? The cause of this condition is not known. However, certain factors make you more likely to have bipolar disorder, such as:  Having a family member with the disorder.  An imbalance of certain chemicals in the brain (neurotransmitters).  Stress, such as illness, financial problems, or a death.  Certain conditions that affect the brain or spinal cord (neurologic conditions).  Brain injury (trauma).  Having another mental health disorder, such as:  Obsessive compulsive disorder.  Schizophrenia. What are the signs or symptoms? Symptoms of mania include:  Very high self-esteem or self-confidence.  Decreased need for sleep.  Unusual talkativeness or feeling a need to keep talking. Speech may be very fast. It may seem like you cannot stop talking.  Racing thoughts or constant talking, with quick shifts between topics that may or may not be related (flight of ideas).  Decreased ability to focus or concentrate.  Increased purposeful activity, such as work, studies, or social activity.  Increased nonproductive activity. This could be pacing, squirming and fidgeting, or finger and toe tapping.  Impulsive behavior and poor judgment. This may result in high-risk activities, such as having unprotected sex or spending a lot of money. Symptoms of depression include:  Feeling sad, hopeless, or helpless.  Frequent or uncontrollable crying.  Lack of feeling or caring about anything.  Sleeping too much.  Moving more slowly than  usual.  Not being able to enjoy things you used to enjoy.  Wanting to be alone all the time.  Feeling guilty or worthless.  Lack of energy or motivation.  Trouble concentrating or remembering.  Trouble making decisions.  Increased appetite.  Thoughts of death, or the desire to harm yourself. Sometimes, you may have a mixed mood. This means having symptoms of depression and mania. Stress can make symptoms worse. How is this diagnosed? To diagnose bipolar disorder, your health care provider may ask about your:  Emotional episodes.  Medical history.  Alcohol and drug use. This includes prescription medicines. Certain medical conditions and substances can cause symptoms that seem like bipolar disorder (secondary bipolar disorder). How is this treated? Bipolar disorder is a long-term (chronic) illness. It is best controlled with ongoing (continuous) treatment rather than treatment only when symptoms occur. Treatment may include:  Medicine. Medicine can be prescribed by a provider who specializes in treating mental disorders (psychiatrist).  Medicines called mood stabilizers are usually prescribed.  If symptoms occur even while taking a mood stabilizer, other medicines may be added.  Psychotherapy. Some forms of talk therapy, such as cognitive-behavioral therapy (CBT), can provide support, education, and guidance.  Coping methods, such as journaling or relaxation exercises. These may include:  Yoga.  Meditation.  Deep breathing.  Lifestyle changes, such as:  Limiting alcohol and drug use.  Exercising regularly.  Getting plenty of sleep.  Making healthy eating choices. A combination of medicine, talk therapy, and coping methods is best. A procedure in which electricity is applied to the brain through the scalp (electroconvulsive therapy) may be used in cases of severe mania when medicine and psychotherapy work too slowly  Limiting alcohol and drug use.  ? Exercising regularly.  ? Getting plenty of sleep.  ? Making healthy eating choices.    A combination of medicine, talk therapy, and coping methods is best. A procedure in which electricity is applied to the brain through the scalp (electroconvulsive therapy) may be used in cases of severe mania when medicine and psychotherapy work too slowly or do not work.  Follow these  instructions at home:  Activity     · Return to your normal activities as told by your health care provider.  · Find activities that you enjoy, and make time to do them.  · Exercise regularly as told by your health care provider.  Lifestyle   · Limit alcohol intake to no more than 1 drink a day for nonpregnant women and 2 drinks a day for men. One drink equals 12 oz of beer, 5 oz of wine, or 1½ oz of hard liquor.  · Follow a set schedule for eating and sleeping.  · Eat a balanced diet that includes fresh fruits and vegetables, whole grains, low-fat dairy, and lean meat.  · Get 7-8 hours of sleep each night.  General instructions   · Take over-the-counter and prescription medicines only as told by your health care provider.  · Think about joining a support group. Your health care provider may be able to recommend a support group.  · Talk with your family and loved ones about your treatment goals and how they can help.  · Keep all follow-up visits as told by your health care provider. This is important.  Where to find more information:  For more information about bipolar disorder, visit the following websites:  · National Alliance on Mental Illness: www.nami.org  · U.S. National Institute of Mental Health: www.nimh.nih.gov    Contact a health care provider if:  · Your symptoms get worse.  · You have side effects from your medicine, and they get worse.  · You have trouble sleeping.  · You have trouble doing daily activities.  · You feel unsafe in your surroundings.  · You are dealing with substance abuse.  Get help right away if:  · You have new symptoms.  · You have thoughts about harming yourself.  · You self-harm.  This information is not intended to replace advice given to you by your health care provider. Make sure you discuss any questions you have with your health care provider.  Document Released: 10/10/2000 Document Revised: 02/28/2016 Document Reviewed: 03/03/2016  Elsevier Interactive Patient Education ©  2017 [employer] Inc.

## 2016-07-05 NOTE — Assessment & Plan Note (Signed)
Encouraged DASH diet, decrease po intake and increase exercise as tolerated. Needs 7-8 hours of sleep nightly. Avoid trans fats, eat small, frequent meals every 4-5 hours with lean proteins, complex carbs and healthy fats. Minimize simple carbs 

## 2016-07-05 NOTE — Assessment & Plan Note (Signed)
Worsening low back pain with increased pain and burning in left leg. Will xray low back today then likely proceed with mri and referral back to Dr Arnoldo Morale her neurosurgery

## 2016-07-05 NOTE — Assessment & Plan Note (Signed)
hgba1c acceptable, minimize simple carbs. Increase exercise as tolerated.  

## 2016-07-05 NOTE — Progress Notes (Signed)
Patient ID: [name], female   DOB: May 21, 1971, 45 y.o.   MRN: [id number]   Subjective:    Patient ID: [name], female    DOB: Jul 24, 1970, 45 y.o.   MRN: [id number]  Chief Complaint  Patient presents with  . bone aches    HPI Patient is in today for bone aches. Patient states her shoulders, arms, back and fingers are aching down to her bones. Has had chronic pain all month. Did not tolerate Seroquel. Struggling with ongoing anhedonia and anxiety. No suicidal ideation. Has not had any acute illness or recent hospitalization. Denies CP/palp/SOB/HA/congestion/fevers/GI or GU c/o. Taking meds as prescribed  Past Medical History:  Diagnosis Date  . Abdominal pain 03/18/2016  . Allergy   . Anemia    in the past  . Anxiety   . Anxiety disorder   . Bipolar depression ([place]) 05/29/2016  . Breast lesion 12/06/2015  . Chicken pox   . Complication of anesthesia 1999   urinary retention  . Depression    in teh past- ok now  . Diarrhea 03/18/2016  . Fibromyalgia   . GERD (gastroesophageal reflux disease)   . Hyperglycemia 03/18/2016  . Hyperlipidemia 08/07/2015  . Left thyroid nodule 03/31/2013  . Melanoma ([place])    right hip and knee  . Migraine   . Neck pain on right side 12/23/2012  . Obesity   . Pain 03/31/2013  . Palpitations 08/10/2014  . Shortness of breath    when anemic  . Sinusitis 08/07/2015  . Skin lesion of breast 12/06/2015  . TIA (transient ischemic attack) 03/31/2013  . UTI (lower urinary tract infection)     Past Surgical History:  Procedure Laterality Date  . ABDOMINAL HYSTERECTOMY    . BACK SURGERY    . BILATERAL SALPINGECTOMY Bilateral 09/13/2012   Procedure: BILATERAL SALPINGECTOMY;  Surgeon: Lavonia Drafts, MD;  Location: [facility];  Service: Gynecology;  Laterality: Bilateral;  . CHOLECYSTECTOMY    . COLONOSCOPY    . LAPAROSCOPIC ASSISTED VAGINAL HYSTERECTOMY N/A 09/13/2012   Procedure: LAPAROSCOPIC ASSISTED VAGINAL HYSTERECTOMY;  Surgeon:  Lavonia Drafts, MD;  Location: [facility];  Service: Gynecology;  Laterality: N/A;  . MELANOMA EXCISION  2011   stage I and II  . WISDOM TOOTH EXTRACTION  1989    Family History  Problem Relation Age of Onset  . Colon cancer Neg Hx   . Colitis Father     crohn's  . Heart disease Father     grandfather  . Heart attack Father 99  . Arthritis Father     s/p back surgery  . Diabetes      grandfather  . Irritable bowel syndrome    . Kidney disease      grandfather  . Arthritis Other   . Cancer Other     breast  . Hyperlipidemia Other   . Mental illness Other   . Other Mother     neurological autoimmune disease, chorea in trunk  . Emphysema Maternal Uncle   . Arthritis Maternal Grandmother   . Lupus Maternal Grandmother   . Dementia Maternal Grandmother   . CVA Maternal Grandmother   . Arthritis Paternal Grandmother   . Heart disease Paternal Grandmother     chf  . Diabetes Paternal Grandfather   . Kidney disease Paternal Grandfather   . Hypertension Paternal Grandfather   . Hyperlipidemia Paternal Grandfather   . Cancer Maternal Grandfather     bone    Social History   Social  History  . Marital status: Divorced    Spouse name: N/A  . Number of children: N/A  . Years of education: N/A   Occupational History  . unemployed    Social History Main Topics  . Smoking status: Never Smoker  . Smokeless tobacco: Never Used     Comment: never used tobacco  . Alcohol use No  . Drug use: No  . Sexual activity: Not Currently    Birth control/ protection: None   Other Topics Concern  . Not on file   Social History Narrative   Daily caffeine    Outpatient Medications Prior to Visit  Medication Sig Dispense Refill  . carisoprodol (SOMA) 350 MG tablet Take 1 tablet (350 mg total) by mouth 2 (two) times daily as needed for muscle spasms. 60 tablet 1  . diazepam (VALIUM) 10 MG tablet Take 1 tablet (10 mg total) by mouth every 12 (twelve) hours as needed for  anxiety or sleep. 40 tablet 3  . HYDROcodone-acetaminophen (NORCO) 10-325 MG tablet Take 1 tablet by mouth every 8 (eight) hours as needed. 60 tablet 0  . levocetirizine (XYZAL) 5 MG tablet Take 1 tablet (5 mg total) by mouth every evening. 90 tablet 1  . pantoprazole (PROTONIX) 40 MG tablet TAKE 1 TABLET (40 MG TOTAL) BY MOUTH 2 TIMES DAILY. 180 tablet 2  . venlafaxine XR (EFFEXOR XR) 150 MG 24 hr capsule Take 1 capsule (150 mg total) by mouth daily with breakfast. 30 capsule 2  . Vitamin D, Ergocalciferol, (DRISDOL) 50000 units CAPS capsule Take 1 capsule (50,000 Units total) by mouth every 7 (seven) days. 4 capsule 4  . amoxicillin-clavulanate (AUGMENTIN) 875-125 MG tablet Take 1 tablet by mouth 2 (two) times daily. 14 tablet 0  . azelastine (ASTELIN) 0.1 % nasal spray Place 2 sprays into both nostrils 2 (two) times daily. Use in each nostril as directed 30 mL 12  . doxycycline (VIBRA-TABS) 100 MG tablet Take 1 tablet (100 mg total) by mouth 2 (two) times daily. 20 tablet 0  . gabapentin (NEURONTIN) 300 MG capsule Take 1 capsule (300 mg total) by mouth 3 (three) times daily. 90 capsule 5  . hyoscyamine (LEVSIN, ANASPAZ) 0.125 MG tablet Take 1 tablet (0.125 mg total) by mouth every 4 (four) hours as needed. 30 tablet 0  . venlafaxine XR (EFFEXOR-XR) 150 MG 24 hr capsule TAKE 1 CAPSULE(150 MG) BY MOUTH DAILY WITH BREAKFAST 30 capsule 0  . QUEtiapine (SEROQUEL) 100 MG tablet Take 1 tablet (100 mg total) by mouth at bedtime. Take 1/2 tablet at bedtime for a week, Then take 1 tablet at bedtime for one week, Then take 2 tablets at bedtime for one week, Then take 3 tablets at bedtime. (Patient not taking: Reported on 07/05/2016) 90 tablet 1   No facility-administered medications prior to visit.     Allergies  Allergen Reactions  . Seroquel [Quetiapine Fumarate] Nausea And Vomiting    Nightmares and vomiting   . Adhesive [Tape] Dermatitis    Band- Aid adhesive  . Aleve [Naproxen Sodium]     Makes  hands, legs, and feet swell  . Carbamazepine     Hallucinations and dizziness    Review of Systems  Constitutional: Negative for fever.  Eyes: Negative for blurred vision.  Respiratory: Negative for cough and shortness of breath.   Cardiovascular: Negative for chest pain and palpitations.  Gastrointestinal: Negative for vomiting.  Musculoskeletal: Positive for back pain, joint pain, myalgias and neck pain.  Skin: Negative for  rash.  Neurological: Negative for loss of consciousness and headaches.  Psychiatric/Behavioral: Positive for depression. Negative for substance abuse and suicidal ideas. The patient is nervous/anxious. The patient does not have insomnia.        Objective:    Physical Exam  Constitutional: She is oriented to person, place, and time. She appears well-developed and well-nourished. No distress.  HENT:  Head: Normocephalic and atraumatic.  Eyes: Conjunctivae are normal.  Neck: Normal range of motion. No thyromegaly present.  Cardiovascular: Normal rate and regular rhythm.   Pulmonary/Chest: Effort normal and breath sounds normal. She has no wheezes.  Abdominal: Soft. Bowel sounds are normal. There is no tenderness.  Musculoskeletal: Normal range of motion. She exhibits no edema or deformity.  Neurological: She is alert and oriented to person, place, and time.  Skin: Skin is warm and dry. She is not diaphoretic.  Psychiatric: She has a normal mood and affect.    BP 128/78 (BP Location: Left Arm, Patient Position: Sitting, Cuff Size: Normal)   Pulse 73   Temp 98.1 F (36.7 C) (Oral)   Wt (!) 317 lb 9.6 oz (144.1 kg)   LMP 08/13/2012   SpO2 95%   BMI 48.29 kg/m  Wt Readings from Last 3 Encounters:  07/05/16 (!) 317 lb 9.6 oz (144.1 kg)  05/23/16 (!) 313 lb 3.2 oz (142.1 kg)  04/15/16 (!) 308 lb (139.7 kg)     Lab Results  Component Value Date   WBC 9.4 07/05/2016   HGB 14.2 07/05/2016   HCT 42.0 07/05/2016   PLT 279.0 07/05/2016   GLUCOSE 99  07/05/2016   CHOL 210 (H) 07/05/2016   TRIG 204.0 (H) 07/05/2016   HDL 41.30 07/05/2016   LDLDIRECT 146.0 07/05/2016   LDLCALC 114 (H) 09/26/2013   ALT 44 (H) 07/05/2016   AST 26 07/05/2016   NA 138 07/05/2016   K 4.2 07/05/2016   CL 101 07/05/2016   CREATININE 0.80 07/05/2016   BUN 15 07/05/2016   CO2 27 07/05/2016   TSH 2.54 07/05/2016   INR 0.98 09/03/2010   HGBA1C 5.3 03/18/2016    Lab Results  Component Value Date   TSH 2.54 07/05/2016   Lab Results  Component Value Date   WBC 9.4 07/05/2016   HGB 14.2 07/05/2016   HCT 42.0 07/05/2016   MCV 92.8 07/05/2016   PLT 279.0 07/05/2016   Lab Results  Component Value Date   NA 138 07/05/2016   K 4.2 07/05/2016   CO2 27 07/05/2016   GLUCOSE 99 07/05/2016   BUN 15 07/05/2016   CREATININE 0.80 07/05/2016   BILITOT 0.5 07/05/2016   ALKPHOS 84 07/05/2016   AST 26 07/05/2016   ALT 44 (H) 07/05/2016   PROT 7.0 07/05/2016   ALBUMIN 4.1 07/05/2016   CALCIUM 9.2 07/05/2016   GFR 82.08 07/05/2016   Lab Results  Component Value Date   CHOL 210 (H) 07/05/2016   Lab Results  Component Value Date   HDL 41.30 07/05/2016   Lab Results  Component Value Date   LDLCALC 114 (H) 09/26/2013   Lab Results  Component Value Date   TRIG 204.0 (H) 07/05/2016   Lab Results  Component Value Date   CHOLHDL 5 07/05/2016   Lab Results  Component Value Date   HGBA1C 5.3 03/18/2016   I acted as a Education administrator for Dr. Charlett Blake. [place], [place]     Assessment & Plan:   Problem List Items Addressed This Visit    Iron deficiency anemia -  Primary   Relevant Orders   IBC panel (Completed)   Obesity    Encouraged DASH diet, decrease po intake and increase exercise as tolerated. Needs 7-8 hours of sleep nightly. Avoid trans fats, eat small, frequent meals every 4-5 hours with lean proteins, complex carbs and healthy fats. Minimize simple carbs      Relevant Orders   CBC (Completed)   TSH (Completed)   Lower extremity edema   Relevant  Orders   DG Lumbar Spine 2-3 Views   Back pain    Worsening low back pain with increased pain and burning in left leg. Will xray low back today then likely proceed with mri and referral back to Dr Arnoldo Morale her neurosurgery      Hyperlipidemia    Encouraged heart healthy diet, increase exercise, avoid trans fats, consider a krill oil cap daily      Relevant Orders   Lipid panel (Completed)   Vitamin D deficiency    Continue daily supplements,.      Relevant Orders   VITAMIN D 25 Hydroxy (Vit-D Deficiency, Fractures) (Completed)   Myalgia    Ongoing diffuse pain. Encouraged increased exercise, heart healthy diet and adequate hydration. Continue Venlafaxine       Hyperglycemia    hgba1c acceptable, minimize simple carbs. Increase exercise as tolerated.       Relevant Orders   Comprehensive metabolic panel (Completed)   TSH (Completed)   Elevated sed rate    Sed rate ordered today      Relevant Orders   Sedimentation rate (Completed)   Bipolar depression ([place])    Did not tolerate Seroquel had nightmares and vomiting. Start Abilify 2 mg next week and continue Effexor.          I have discontinued Ms. Douglas's azelastine, hyoscyamine, amoxicillin-clavulanate, doxycycline, and QUEtiapine. I have also changed her gabapentin. Additionally, I am having her start on ARIPiprazole. Lastly, I am having her maintain her diazepam, Vitamin D (Ergocalciferol), pantoprazole, levocetirizine, venlafaxine XR, HYDROcodone-acetaminophen, and carisoprodol.  Meds ordered this encounter  Medications  . ARIPiprazole (ABILIFY) 2 MG tablet    Sig: Take 1 tablet (2 mg total) by mouth at bedtime.    Dispense:  30 tablet    Refill:  1  . gabapentin (NEURONTIN) 300 MG capsule    Sig: 2 caps three times a day    Dispense:  180 capsule    Refill:  5   CMA served as scribe during this visit. History, Physical and Plan performed by medical provider. Documentation and orders reviewed and attested  to.   Penni Homans, MD

## 2016-07-05 NOTE — Assessment & Plan Note (Signed)
Check cbc and cmp. Minimize fatty and spicy foods and take probiotics and fiber

## 2016-07-05 NOTE — Progress Notes (Signed)
Pre visit review using our clinic review tool, if applicable. No additional management support is needed unless otherwise documented below in the visit note. 

## 2016-07-05 NOTE — Assessment & Plan Note (Signed)
Sed rate ordered today

## 2016-07-20 NOTE — Assessment & Plan Note (Signed)
Encouraged heart healthy diet, increase exercise, avoid trans fats, consider a krill oil cap daily 

## 2016-07-20 NOTE — Assessment & Plan Note (Signed)
Ongoing diffuse pain. Encouraged increased exercise, heart healthy diet and adequate hydration. Continue Venlafaxine

## 2016-07-20 NOTE — Assessment & Plan Note (Signed)
Continue daily supplements,.

## 2016-07-21 ENCOUNTER — Other Ambulatory Visit: Payer: Self-pay | Admitting: Family Medicine

## 2016-07-22 MED ORDER — HYDROCODONE-ACETAMINOPHEN 10-325 MG PO TABS: 1.0000 | ORAL_TABLET | Freq: Three times a day (TID) | ORAL | 0 refills | Status: DC | PRN

## 2016-07-22 NOTE — Telephone Encounter (Signed)
Patient informed to pickup hardcopy at the front desk. 

## 2016-07-22 NOTE — Telephone Encounter (Signed)
Last refill on 06/20/2016   #60 with 0 refills Last office visit 07/05/2016

## 2016-07-27 ENCOUNTER — Other Ambulatory Visit: Payer: Self-pay | Admitting: Family Medicine

## 2016-07-27 ENCOUNTER — Telehealth: Payer: Self-pay | Admitting: Family Medicine

## 2016-07-27 ENCOUNTER — Ambulatory Visit: Payer: Self-pay | Admitting: Internal Medicine

## 2016-07-27 NOTE — Telephone Encounter (Signed)
Patient scheduled appointment today and lvm cancelling 2:15pm appointment today due to court conflict, charge or no charge

## 2016-07-27 NOTE — Telephone Encounter (Signed)
Relation to [id number] Call back number:[phone]   Reason for call:  patient would like to know if Dr. Charlett Blake can work her in on Friday 07/29/16 due to back pain, please advise

## 2016-07-27 NOTE — Telephone Encounter (Signed)
Provider is full, recommend to see other providers in office.

## 2016-07-27 NOTE — Telephone Encounter (Signed)
No Charge 

## 2016-07-27 NOTE — Telephone Encounter (Signed)
Patient scheduled with Aldona Bar, NP  Friday 07/29/16

## 2016-07-29 ENCOUNTER — Encounter: Payer: Self-pay | Admitting: Family Medicine

## 2016-07-29 ENCOUNTER — Encounter: Payer: Self-pay | Admitting: Physician Assistant

## 2016-07-29 ENCOUNTER — Ambulatory Visit (INDEPENDENT_AMBULATORY_CARE_PROVIDER_SITE_OTHER): Payer: BLUE CROSS/BLUE SHIELD | Admitting: Physician Assistant

## 2016-07-29 ENCOUNTER — Ambulatory Visit (HOSPITAL_BASED_OUTPATIENT_CLINIC_OR_DEPARTMENT_OTHER)
Admission: RE | Admit: 2016-07-29 | Discharge: 2016-07-29 | Disposition: A | Discharge status: Home / Self Care | Payer: BLUE CROSS/BLUE SHIELD | Source: Ambulatory Visit | Attending: Family Medicine | Admitting: Family Medicine

## 2016-07-29 ENCOUNTER — Other Ambulatory Visit: Payer: Self-pay

## 2016-07-29 VITALS — BP 118/78 | HR 94 | Temp 98.1°F | Ht 68.0 in | Wt 319.4 lb

## 2016-07-29 DIAGNOSIS — M5137 Other intervertebral disc degeneration, lumbosacral region: Secondary | ICD-10-CM | POA: Insufficient documentation

## 2016-07-29 DIAGNOSIS — F3130 Bipolar disorder, current episode depressed, mild or moderate severity, unspecified: Secondary | ICD-10-CM | POA: Diagnosis not present

## 2016-07-29 DIAGNOSIS — Z79891 Long term (current) use of opiate analgesic: Secondary | ICD-10-CM | POA: Diagnosis not present

## 2016-07-29 DIAGNOSIS — R600 Localized edema: Secondary | ICD-10-CM | POA: Insufficient documentation

## 2016-07-29 DIAGNOSIS — F319 Bipolar disorder, unspecified: Secondary | ICD-10-CM

## 2016-07-29 DIAGNOSIS — M2550 Pain in unspecified joint: Secondary | ICD-10-CM

## 2016-07-29 DIAGNOSIS — M5136 Other intervertebral disc degeneration, lumbar region: Secondary | ICD-10-CM | POA: Diagnosis not present

## 2016-07-29 MED ORDER — VENLAFAXINE HCL ER 150 MG PO CP24: 150.0000 mg | ORAL_CAPSULE | Freq: Every day | ORAL | 5 refills | Status: DC

## 2016-07-29 MED ORDER — CARISOPRODOL 350 MG PO TABS: 350.0000 mg | ORAL_TABLET | Freq: Two times a day (BID) | ORAL | 0 refills | Status: DC | PRN

## 2016-07-29 NOTE — Patient Instructions (Signed)
It was great meeting you today!  Please follow-up with Dr. Randel Pigg for more refills of your Soma.  Please stop by the lab on your way out to complete your labs and urine drug screen. Please follow-up with [place] if your symptoms do not improve once you are back on your effexor.

## 2016-07-29 NOTE — Progress Notes (Signed)
Subjective:    Patient ID: [name], female    DOB: March 07, 1971, 46 y.o.   MRN: [id number]  HPI  [name] is a 46 y/o female who presents with chief complaint of body pain. [name] is out of her Manuela Neptune and tried to call and get it refilled however, [name] said [name] never heard back about the refill. [name] continues to have diffuse muscle pain and has been working with Dr. Randel Pigg about this issue for quite some time. [name] denies any changes to her muscle pain.  [name] is also out of her Effexor. [name] last took on Monday. [name] states that [name] feels as though [name] is having withdrawals from not being on Effexor, specifically [name] has had intermittent dizziness and since Tuesday [name] has instances where [name] hears a "clicking" noise. This is intermittent, has not worsened since it started. Denies blurred vision, headaches, n/v, poor appetite, slurred speech, weakness on one side of the body. [name] attributes this to coming off the Effexor. Denies any concerns regarding her mood.  [name] also notes that [name] has started to have some sinus congestion that started Tuesday. No fevers, chills, cough.  Review of Systems  See HPI  Past Medical History:  Diagnosis Date  . Abdominal pain 03/18/2016  . Allergy   . Anemia    in the past  . Anxiety   . Anxiety disorder   . Bipolar depression ([place]) 05/29/2016  . Breast lesion 12/06/2015  . Chicken pox   . Complication of anesthesia 1999   urinary retention  . Depression    in teh past- ok now  . Diarrhea 03/18/2016  . Fibromyalgia   . GERD (gastroesophageal reflux disease)   . Hyperglycemia 03/18/2016  . Hyperlipidemia 08/07/2015  . Left thyroid nodule 03/31/2013  . Melanoma ([place])    right hip and knee  . Migraine   . Neck pain on right side 12/23/2012  . Obesity   . Pain 03/31/2013  . Palpitations 08/10/2014  . Shortness of breath    when anemic  . Sinusitis 08/07/2015  . Skin lesion of breast 12/06/2015  . TIA (transient ischemic attack) 03/31/2013  . UTI  (lower urinary tract infection)      Social History   Social History  . Marital status: Divorced    Spouse name: N/A  . Number of children: N/A  . Years of education: N/A   Occupational History  . unemployed    Social History Main Topics  . Smoking status: Never Smoker  . Smokeless tobacco: Never Used     Comment: never used tobacco  . Alcohol use No  . Drug use: No  . Sexual activity: Not Currently    Birth control/ protection: None   Other Topics Concern  . Not on file   Social History Narrative   Daily caffeine    Past Surgical History:  Procedure Laterality Date  . ABDOMINAL HYSTERECTOMY    . BACK SURGERY    . BILATERAL SALPINGECTOMY Bilateral 09/13/2012   Procedure: BILATERAL SALPINGECTOMY;  Surgeon: Lavonia Drafts, MD;  Location: [facility];  Service: Gynecology;  Laterality: Bilateral;  . CHOLECYSTECTOMY    . COLONOSCOPY    . LAPAROSCOPIC ASSISTED VAGINAL HYSTERECTOMY N/A 09/13/2012   Procedure: LAPAROSCOPIC ASSISTED VAGINAL HYSTERECTOMY;  Surgeon: Lavonia Drafts, MD;  Location: [facility];  Service: Gynecology;  Laterality: N/A;  . MELANOMA EXCISION  2011   stage I and [place]    Family History  Problem Relation Age of Onset  . Colon cancer Neg Hx   . Colitis Father     crohn's  . Heart disease Father     grandfather  . Heart attack Father 59  . Arthritis Father     s/p back surgery  . Diabetes      grandfather  . Irritable bowel syndrome    . Kidney disease      grandfather  . Arthritis Other   . Cancer Other     breast  . Hyperlipidemia Other   . Mental illness Other   . Other Mother     neurological autoimmune disease, chorea in trunk  . Emphysema Maternal Uncle   . Arthritis Maternal Grandmother   . Lupus Maternal Grandmother   . Dementia Maternal Grandmother   . CVA Maternal Grandmother   . Arthritis Paternal Grandmother   . Heart disease Paternal Grandmother     chf  . Diabetes Paternal  Grandfather   . Kidney disease Paternal Grandfather   . Hypertension Paternal Grandfather   . Hyperlipidemia Paternal Grandfather   . Cancer Maternal Grandfather     bone    Allergies  Allergen Reactions  . Seroquel [Quetiapine Fumarate] Nausea And Vomiting    Nightmares and vomiting   . Adhesive [Tape] Dermatitis    Band- Aid adhesive  . Aleve [Naproxen Sodium]     Makes hands, legs, and feet swell  . Carbamazepine     Hallucinations and dizziness    Current Outpatient Prescriptions on File Prior to Visit  Medication Sig Dispense Refill  . diazepam (VALIUM) 10 MG tablet Take 1 tablet (10 mg total) by mouth every 12 (twelve) hours as needed for anxiety or sleep. 40 tablet 3  . gabapentin (NEURONTIN) 300 MG capsule 2 caps three times a day 180 capsule 5  . HYDROcodone-acetaminophen (NORCO) 10-325 MG tablet Take 1 tablet by mouth every 8 (eight) hours as needed. 60 tablet 0  . levocetirizine (XYZAL) 5 MG tablet Take 1 tablet (5 mg total) by mouth every evening. 90 tablet 1  . pantoprazole (PROTONIX) 40 MG tablet TAKE 1 TABLET (40 MG TOTAL) BY MOUTH 2 TIMES DAILY. 180 tablet 2  . Vitamin D, Ergocalciferol, (DRISDOL) 50000 units CAPS capsule TAKE 1 CAPSULE BY MOUTH EVERY 7 DAYS 4 capsule 0  . ARIPiprazole (ABILIFY) 2 MG tablet Take 1 tablet (2 mg total) by mouth at bedtime. (Patient not taking: Reported on 07/29/2016) 30 tablet 1   No current facility-administered medications on file prior to visit.     BP 118/78   Pulse 94   Temp 98.1 F (36.7 C) (Oral)   Ht 5\' 8"  (1.727 m)   Wt (!) 319 lb 6.4 oz (144.9 kg)   LMP 08/13/2012   SpO2 97%   BMI 48.56 kg/m      Objective:   Physical Exam  Constitutional: Vital signs are normal. [name] is cooperative.  HENT:  Head: Normocephalic and atraumatic.  Right Ear: Tympanic membrane, external ear and ear canal normal. Tympanic membrane is not erythematous, not retracted and not bulging.  Left Ear: Tympanic membrane, external ear and ear  canal normal. Tympanic membrane is not erythematous, not retracted and not bulging.  Nose: Right sinus exhibits no maxillary sinus tenderness and no frontal sinus tenderness. Left sinus exhibits no maxillary sinus tenderness and no frontal sinus tenderness.  Mouth/Throat: No posterior oropharyngeal edema or posterior oropharyngeal erythema.  Eyes: Conjunctivae and EOM are normal. Pupils are equal, round, and reactive  to light. Right eye exhibits no nystagmus. Left eye exhibits no nystagmus.  Cardiovascular: Normal rate, regular rhythm and normal heart sounds.   Pulmonary/Chest: Effort normal and breath sounds normal.  Lymphadenopathy:    [name] has no cervical adenopathy.  Neurological: [name] is alert. [name] has normal strength. No cranial nerve deficit or sensory deficit. GCS eye subscore is 4. GCS verbal subscore is 5. GCS motor subscore is 6.  Nursing note and vitals reviewed.         Assessment & Plan:  1. Arthralgia, unspecified joint Refilled Soma x 1 month, #60. Patient signed Controlled Substances Contract today. UDS also performed.  Patient is requesting referral to Dr. Cy Blamer (rheumatologist). Most recent ESR was elevated. Will complete ANA and rheumatoid factor labs today to evaluate further.  Will consider referral based on results of labs/symptom progression.  2. Bipolar depression ([place]) Refill Effexor today, I refilled for 6 months. [name] has yet to start Abilify. Recommend starting Effexor and waiting a few days to begin Abilify. Patient is to let us know if symptoms do not improve. Suspect her audiology concerns may be related to withdrawal from Effexor, patient to follow-up with [place] if symptoms do not improve or if new symptoms develop.  Inda Coke PA-C 07/29/16

## 2016-07-29 NOTE — Progress Notes (Signed)
Pre visit review using our clinic tool,if applicable. No additional management support is needed unless otherwise documented below in the visit note.  

## 2016-08-01 ENCOUNTER — Other Ambulatory Visit: Payer: Self-pay | Admitting: Family Medicine

## 2016-08-01 DIAGNOSIS — M5440 Lumbago with sciatica, unspecified side: Secondary | ICD-10-CM

## 2016-08-01 LAB — ANA: ANA: POSITIVE — AB

## 2016-08-01 LAB — ANTI-NUCLEAR AB-TITER (ANA TITER): ANA Titer 1: 1:640 {titer} — ABNORMAL HIGH

## 2016-08-01 LAB — RHEUMATOID FACTOR: Rhuematoid fact SerPl-aCnc: <14 IU/mL (ref <14)

## 2016-08-02 ENCOUNTER — Other Ambulatory Visit: Payer: Self-pay | Admitting: Physician Assistant

## 2016-08-02 DIAGNOSIS — R52 Pain, unspecified: Secondary | ICD-10-CM

## 2016-08-02 DIAGNOSIS — R700 Elevated erythrocyte sedimentation rate: Secondary | ICD-10-CM

## 2016-08-06 ENCOUNTER — Encounter (HOSPITAL_BASED_OUTPATIENT_CLINIC_OR_DEPARTMENT_OTHER): Payer: BLUE CROSS/BLUE SHIELD

## 2016-08-09 ENCOUNTER — Encounter: Payer: Self-pay | Admitting: Family Medicine

## 2016-08-16 ENCOUNTER — Other Ambulatory Visit: Payer: Self-pay | Admitting: Family Medicine

## 2016-08-16 ENCOUNTER — Encounter: Payer: Self-pay | Admitting: Family Medicine

## 2016-08-16 DIAGNOSIS — T781XXA Other adverse food reactions, not elsewhere classified, initial encounter: Secondary | ICD-10-CM

## 2016-08-16 DIAGNOSIS — K219 Gastro-esophageal reflux disease without esophagitis: Secondary | ICD-10-CM

## 2016-08-16 DIAGNOSIS — R198 Other specified symptoms and signs involving the digestive system and abdomen: Secondary | ICD-10-CM

## 2016-08-19 ENCOUNTER — Other Ambulatory Visit: Payer: Self-pay | Admitting: Family Medicine

## 2016-08-19 DIAGNOSIS — Z1231 Encounter for screening mammogram for malignant neoplasm of breast: Secondary | ICD-10-CM

## 2016-08-22 ENCOUNTER — Other Ambulatory Visit: Payer: Self-pay | Admitting: Family Medicine

## 2016-08-26 ENCOUNTER — Telehealth: Payer: Self-pay | Admitting: Family Medicine

## 2016-08-26 ENCOUNTER — Ambulatory Visit: Payer: Self-pay | Admitting: Family Medicine

## 2016-08-26 NOTE — Telephone Encounter (Signed)
Patient cancelled 9am appointment today due to patient having 2 sick children at home, charge or no charge

## 2016-08-26 NOTE — Telephone Encounter (Signed)
No charge. 

## 2016-08-29 ENCOUNTER — Encounter: Payer: Self-pay | Admitting: Family Medicine

## 2016-08-29 ENCOUNTER — Other Ambulatory Visit: Payer: Self-pay | Admitting: Family Medicine

## 2016-08-30 ENCOUNTER — Other Ambulatory Visit: Payer: Self-pay | Admitting: Family Medicine

## 2016-08-30 MED ORDER — CARISOPRODOL 350 MG PO TABS: 350.0000 mg | ORAL_TABLET | Freq: Two times a day (BID) | ORAL | 0 refills | Status: DC | PRN

## 2016-08-31 ENCOUNTER — Ambulatory Visit: Payer: BLUE CROSS/BLUE SHIELD | Admitting: Physician Assistant

## 2016-09-01 ENCOUNTER — Other Ambulatory Visit: Payer: Self-pay | Admitting: Family Medicine

## 2016-09-01 MED ORDER — HYDROCODONE-ACETAMINOPHEN 10-325 MG PO TABS: 1.0000 | ORAL_TABLET | Freq: Three times a day (TID) | ORAL | 0 refills | Status: DC | PRN

## 2016-09-01 NOTE — Telephone Encounter (Signed)
Printed hydrocodone as PCP instructed/she signed/contacted the patient by mychart to pickup

## 2016-09-02 MED FILL — HYDROCODON-APAP 10-325: 10-325 | 20 days supply | Qty: 60 | Fill #0

## 2016-09-09 ENCOUNTER — Ambulatory Visit: Payer: Self-pay | Admitting: Allergy and Immunology

## 2016-09-17 ENCOUNTER — Other Ambulatory Visit: Payer: Self-pay | Admitting: Family Medicine

## 2016-09-19 ENCOUNTER — Other Ambulatory Visit: Payer: Self-pay | Admitting: Family Medicine

## 2016-09-19 ENCOUNTER — Encounter: Payer: Self-pay | Admitting: Family Medicine

## 2016-09-19 DIAGNOSIS — Z124 Encounter for screening for malignant neoplasm of cervix: Secondary | ICD-10-CM

## 2016-09-19 NOTE — Telephone Encounter (Signed)
Can take one high dose vit D monthly til gone and Vit D 2000 IU daily

## 2016-09-19 NOTE — Telephone Encounter (Signed)
Patient level is now normal and was supposed to be on the over the counter but it was refilled.  Would you like for her to start the otc vit D.  If so what dose of otc?

## 2016-09-21 ENCOUNTER — Other Ambulatory Visit: Payer: Self-pay | Admitting: Surgery

## 2016-09-21 DIAGNOSIS — E041 Nontoxic single thyroid nodule: Secondary | ICD-10-CM

## 2016-09-22 NOTE — Progress Notes (Signed)
Office Visit Note  Patient: [name]             Date of Birth: 03-29-1971           MRN: [id number]             PCP: Penni Homans, MD Referring: Inda Coke, PA Visit Date: 09/23/2016 Occupation: '@GUAROCC'$ @    Subjective:  Fatigue, joint pain   History of Present Illness: [name] is a 46 y.o. female seen in consultation per request of her PCP. According to patient in 2010 she started experiencing increased fatigue at the time she was diagnosed with anemia. She states the workup was negative. She was referred to Dr. Marin Olp she is also experiencing some neck pain and was seen by Dr. Arnoldo Morale and she had several infusions of iron. She underwent hysterectomy and gradually her anemia improved. She states in 90s she started having lower back pain and in 1999 she underwent discectomy. Her lower back pain has recurred. She's also having some neck discomfort. She was evaluated by Dr. Arnoldo Morale who recommended possible surgery. As she was also experiencing some shoulder joint pain she was referred to Dr. supple for evaluation she has not seen him yet. She describes pain in her bilateral shoulders difficulty lifting her arms and pain in her elbows wrist and hands. She complains of hand pain and swelling and also some discomfort in her wrists joints. She had to change the ring size in the last 1 year. She also complains of discomfort in her bilateral ankles bilateral feet. Her left knee joint has been painful for one year and she had x-rays for that. She continues to have ongoing fatigue. She describes her fatigue to be severe. Patient also complains of myalgias and generalized hyperalgesia.  Activities of Daily Living:  Patient reports morning stiffness for 3-4 hours.   Patient Reports nocturnal pain.  Difficulty dressing/grooming: Reports Difficulty climbing stairs: Reports Difficulty getting out of chair: Reports Difficulty using hands for taps, buttons, cutlery, and/or  writing: Denies   Review of Systems  Constitutional: Positive for fatigue and weight gain. Negative for night sweats, weight loss and weakness.  HENT: Positive for mouth sores and mouth dryness. Negative for trouble swallowing, trouble swallowing and nose dryness.   Eyes: Positive for dryness. Negative for pain, redness and visual disturbance.  Respiratory: Negative for cough, shortness of breath and difficulty breathing.   Cardiovascular: Negative for chest pain, palpitations, hypertension, irregular heartbeat and swelling in legs/feet.  Gastrointestinal: Positive for diarrhea. Negative for blood in stool and constipation.       Status post cholecystectomy  Endocrine: Negative for increased urination.  Genitourinary: Negative for vaginal dryness.  Musculoskeletal: Positive for arthralgias, joint pain, joint swelling and morning stiffness. Negative for myalgias, muscle weakness, muscle tenderness and myalgias.  Skin: Positive for color change. Negative for rash, hair loss, skin tightness, ulcers and sensitivity to sunlight.  Allergic/Immunologic: Negative for susceptible to infections.  Neurological: Negative for dizziness, memory loss and night sweats.  Hematological: Negative for swollen glands.  Psychiatric/Behavioral: Positive for depressed mood and sleep disturbance. The patient is nervous/anxious.     PMFS History:  Patient Active Problem List   Diagnosis Date Noted  . Elevated sed rate 05/29/2016  . Bipolar depression ([place]) 05/29/2016  . Fever 05/23/2016  . Other complicated headache syndrome 05/23/2016  . Rash and nonspecific skin eruption 05/23/2016  . Hyperglycemia 03/18/2016  . Diarrhea 03/18/2016  . Abdominal pain 03/18/2016  . Perimenopausal 01/31/2016  .  Vitamin D deficiency 01/31/2016  . Myalgia 01/31/2016  . Skin lesion of breast 12/06/2015  . Dysphagia 08/12/2015  . Peptic stricture of esophagus 08/12/2015  . Hyperlipidemia 08/07/2015  . Left foot pain  08/07/2015  . Back pain 08/19/2014  . Palpitations 08/10/2014  . Multiple thyroid nodules, dominant nodule left lobe 1.8 cm 03/31/2013  . Pain 03/31/2013  . Neck pain on right side 12/23/2012  . Fibroids 07/04/2012  . Menorrhagia 06/25/2012  . Hypertriglyceridemia 04/23/2012  . Annual physical exam 12/15/2011  . Obesity 12/10/2010  . Lower extremity edema 12/10/2010  . Iron deficiency anemia 08/18/2009  . GERD 08/18/2009    Past Medical History:  Diagnosis Date  . Abdominal pain 03/18/2016  . Allergy   . Anemia    in the past  . Anxiety   . Anxiety disorder   . Bipolar depression ([place]) 05/29/2016  . Breast lesion 12/06/2015  . Chicken pox   . Complication of anesthesia 1999   urinary retention  . Depression    in teh past- ok now  . Diarrhea 03/18/2016  . Fibromyalgia   . GERD (gastroesophageal reflux disease)   . Hyperglycemia 03/18/2016  . Hyperlipidemia 08/07/2015  . Left thyroid nodule 03/31/2013  . Melanoma ([place])    right hip and knee  . Migraine   . Neck pain on right side 12/23/2012  . Obesity   . Pain 03/31/2013  . Palpitations 08/10/2014  . Shortness of breath    when anemic  . Sinusitis 08/07/2015  . Skin lesion of breast 12/06/2015  . TIA (transient ischemic attack) 03/31/2013  . UTI (lower urinary tract infection)     Family History  Problem Relation Age of Onset  . Colon cancer Neg Hx   . Colitis Father     crohn's  . Heart disease Father     grandfather  . Heart attack Father 81  . Arthritis Father     s/p back surgery  . Diabetes      grandfather  . Irritable bowel syndrome    . Kidney disease      grandfather  . Arthritis Other   . Cancer Other     breast  . Hyperlipidemia Other   . Mental illness Other   . Other Mother     neurological autoimmune disease, chorea in trunk  . Emphysema Maternal Uncle   . Arthritis Maternal Grandmother   . Lupus Maternal Grandmother   . Dementia Maternal Grandmother   . CVA Maternal Grandmother   . Arthritis  Paternal Grandmother   . Heart disease Paternal Grandmother     chf  . Diabetes Paternal Grandfather   . Kidney disease Paternal Grandfather   . Hypertension Paternal Grandfather   . Hyperlipidemia Paternal Grandfather   . Cancer Maternal Grandfather     bone   Past Surgical History:  Procedure Laterality Date  . ABDOMINAL HYSTERECTOMY    . BACK SURGERY    . BILATERAL SALPINGECTOMY Bilateral 09/13/2012   Procedure: BILATERAL SALPINGECTOMY;  Surgeon: Lavonia Drafts, MD;  Location: [facility];  Service: Gynecology;  Laterality: Bilateral;  . CHOLECYSTECTOMY    . COLONOSCOPY    . LAPAROSCOPIC ASSISTED VAGINAL HYSTERECTOMY N/A 09/13/2012   Procedure: LAPAROSCOPIC ASSISTED VAGINAL HYSTERECTOMY;  Surgeon: Lavonia Drafts, MD;  Location: [facility];  Service: Gynecology;  Laterality: N/A;  . MELANOMA EXCISION  2011   stage I and II  . WISDOM TOOTH EXTRACTION  1989   Social History   Social History Narrative  Daily caffeine     Objective: Vital Signs: BP 134/78   Pulse 78   Resp 16   Ht 5\' 10"  (1.778 m)   Wt (!) 320 lb (145.2 kg)   LMP 08/13/2012   BMI 45.92 kg/m    Physical Exam  Constitutional: She is oriented to person, place, and time. She appears well-developed and well-nourished.  HENT:  Head: Normocephalic and atraumatic.  Eyes: Conjunctivae and EOM are normal.  Neck: Normal range of motion.  Cardiovascular: Normal rate, regular rhythm, normal heart sounds and intact distal pulses.   Pulmonary/Chest: Effort normal and breath sounds normal.  Abdominal: Soft. Bowel sounds are normal.  Lymphadenopathy:    She has no cervical adenopathy.  Neurological: She is alert and oriented to person, place, and time.  Skin: Skin is warm and dry. Capillary refill takes less than 2 seconds.  Psychiatric: She has a normal mood and affect. Her behavior is normal.  Nursing note and vitals reviewed.    Musculoskeletal Exam: C-spine, thoracic spine limited range of  motion,Lumbar spine limited painful range of motion. Shoulder joints painful limited range of motion. Elbow joints good range of motion. Wrists joint MCPs PIPs with good range of motion with no synovitis. Hip joints knee joints ankles MTPs PIPs were good range of motion with no synovitis.  CDAI Exam: No CDAI exam completed.    Investigation: Findings:  November 2017 Lyme titer negative RMSF negative Ehrlichia negative, magnesium normal, ESR 33 07/05/2016 ESR 21 vitamin D 30.79 TSH normal, CBC normal, CMP ALT 44 07/29/2016 ANA 1:640 centromere, RF negative    Imaging: Xr Hand 2 View Left  Result Date: 09/23/2016 Minimal PIP narrowing no MCP DIP or intercarpal joint space narrowing was noted. No erosive changes were noted. Impression: He is findings were consistent with mild osteoarthritis of the hand  Xr Hand 2 View Right  Result Date: 09/23/2016 Minimal PIP narrowing no MCP DIP or intercarpal joint space narrowing was noted. No erosive changes were noted. Impression: He is findings were consistent with mild osteoarthritis of the hand   Speciality Comments: No specialty comments available.    Procedures:  No procedures performed Allergies: Seroquel [quetiapine fumarate]; Adhesive [tape]; Aleve [naproxen sodium]; and Carbamazepine   Assessment / Plan:     Visit Diagnoses: Positive ANA (antinuclear antibody) - ANA 1:640 centromere, arthralgia. No synovitis on examination. 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 She also gives history of frequent oral ulcers. She gives history of extreme fatigue. No sclerodactyly was noted.  Bilateral shoulder pain: She has limited range of motion. Her x-rays were unremarkable she is appointment coming up with orthopedics.  Left knee joint pain: She has no warmth swelling or effusion. X-ray showed only mild osteoarthritic changes.  Pain hands: No synovitis was noted on examination today I will do x-rays today and also will schedule ultrasound to look for  synovitis.  Myalgia: It raises concerns for fibromyalgia. 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 XR Hand 2 View Left  . XR Hand 2 View Right  . [id number] ENA PANEL  . C3 and C4  . Cardiolipin antibody  . Lupus anticoagulant panel  . Beta-2 glycoprotein antibodies  . CK  . Pan-ANCA  . Cryoglobulin   No orders of the defined types were placed in this encounter.   Face-to-face time spent with patient was 50 minutes. 50% of time was spent in counseling and coordination of care.  Follow-Up Instructions: Return for Positive ANA.   Bo Merino, MD  Note - This record has been created using Editor, commissioning.  Chart creation errors have been sought, but may not always  have been located. Such creation errors do not reflect on  the standard of medical care.

## 2016-09-23 ENCOUNTER — Encounter: Payer: Self-pay | Admitting: Rheumatology

## 2016-09-23 ENCOUNTER — Ambulatory Visit (INDEPENDENT_AMBULATORY_CARE_PROVIDER_SITE_OTHER): Payer: BLUE CROSS/BLUE SHIELD

## 2016-09-23 ENCOUNTER — Ambulatory Visit (INDEPENDENT_AMBULATORY_CARE_PROVIDER_SITE_OTHER): Payer: BLUE CROSS/BLUE SHIELD | Admitting: Rheumatology

## 2016-09-23 ENCOUNTER — Telehealth: Payer: Self-pay | Admitting: Radiology

## 2016-09-23 VITALS — BP 134/78 | HR 78 | Resp 16 | Ht 70.0 in | Wt 320.0 lb

## 2016-09-23 DIAGNOSIS — R768 Other specified abnormal immunological findings in serum: Secondary | ICD-10-CM

## 2016-09-23 DIAGNOSIS — M47816 Spondylosis without myelopathy or radiculopathy, lumbar region: Secondary | ICD-10-CM

## 2016-09-23 DIAGNOSIS — E559 Vitamin D deficiency, unspecified: Secondary | ICD-10-CM | POA: Diagnosis not present

## 2016-09-23 DIAGNOSIS — G4459 Other complicated headache syndrome: Secondary | ICD-10-CM

## 2016-09-23 DIAGNOSIS — E6609 Other obesity due to excess calories: Secondary | ICD-10-CM

## 2016-09-23 DIAGNOSIS — E785 Hyperlipidemia, unspecified: Secondary | ICD-10-CM | POA: Diagnosis not present

## 2016-09-23 DIAGNOSIS — F3130 Bipolar disorder, current episode depressed, mild or moderate severity, unspecified: Secondary | ICD-10-CM | POA: Diagnosis not present

## 2016-09-23 DIAGNOSIS — K219 Gastro-esophageal reflux disease without esophagitis: Secondary | ICD-10-CM

## 2016-09-23 DIAGNOSIS — M79642 Pain in left hand: Secondary | ICD-10-CM

## 2016-09-23 DIAGNOSIS — M791 Myalgia, unspecified site: Secondary | ICD-10-CM

## 2016-09-23 DIAGNOSIS — M5030 Other cervical disc degeneration, unspecified cervical region: Secondary | ICD-10-CM | POA: Diagnosis not present

## 2016-09-23 DIAGNOSIS — M79641 Pain in right hand: Secondary | ICD-10-CM

## 2016-09-23 DIAGNOSIS — E042 Nontoxic multinodular goiter: Secondary | ICD-10-CM

## 2016-09-23 DIAGNOSIS — F319 Bipolar disorder, unspecified: Secondary | ICD-10-CM

## 2016-09-23 DIAGNOSIS — M47812 Spondylosis without myelopathy or radiculopathy, cervical region: Secondary | ICD-10-CM

## 2016-09-23 DIAGNOSIS — Z840 Family history of diseases of the skin and subcutaneous tissue: Secondary | ICD-10-CM

## 2016-09-23 LAB — CK: CK TOTAL: 71 U/L (ref 7–177)

## 2016-09-23 NOTE — Telephone Encounter (Signed)
Released xray results to my chart

## 2016-09-25 LAB — CARDIOLIPIN ANTIBODY: Phospholipids: 219 mg/dL (ref 151–264)

## 2016-09-26 ENCOUNTER — Ambulatory Visit: Payer: Self-pay | Admitting: Physician Assistant

## 2016-09-26 LAB — LUPUS ANTICOAGULANT PANEL

## 2016-09-26 LAB — C3 AND C4
C3 COMPLEMENT: 193 mg/dL (ref 83–193)
C4 Complement: 33 mg/dL (ref 15–57)

## 2016-09-27 ENCOUNTER — Telehealth: Payer: Self-pay | Admitting: Family Medicine

## 2016-09-27 ENCOUNTER — Encounter: Payer: Self-pay | Admitting: Physician Assistant

## 2016-09-27 ENCOUNTER — Ambulatory Visit (INDEPENDENT_AMBULATORY_CARE_PROVIDER_SITE_OTHER): Payer: BLUE CROSS/BLUE SHIELD | Admitting: Physician Assistant

## 2016-09-27 VITALS — BP 124/98 | HR 96 | Temp 98.3°F | Resp 16 | Ht 68.0 in | Wt 323.0 lb

## 2016-09-27 DIAGNOSIS — J0100 Acute maxillary sinusitis, unspecified: Secondary | ICD-10-CM | POA: Diagnosis not present

## 2016-09-27 LAB — BETA-2 GLYCOPROTEIN ANTIBODIES
Beta-2 Glyco I IgG: 9 SGU (ref <=20)
Beta-2-Glycoprotein I IgM: <9 SMU (ref <=20)

## 2016-09-27 MED ORDER — FLUCONAZOLE 150 MG PO TABS: 150.0000 mg | ORAL_TABLET | Freq: Once | ORAL | 0 refills | Status: AC

## 2016-09-27 MED ORDER — AMOXICILLIN-POT CLAVULANATE 875-125 MG PO TABS: 1.0000 | ORAL_TABLET | Freq: Two times a day (BID) | ORAL | 0 refills | Status: DC

## 2016-09-27 NOTE — Telephone Encounter (Signed)
Medication sent.

## 2016-09-27 NOTE — Telephone Encounter (Signed)
Rx sent to the preferred patient pharmacy  

## 2016-09-27 NOTE — Telephone Encounter (Signed)
Patient states she was seen in office this morning and was prescribed an antibiotic.  She is requesting a rx for diflucan to prevent getting a yeast infection while taking the antibiotic.  Pharmacy:  [facility] [place], [place] AT [address] [phone] (Phone) [phone] (Fax)

## 2016-09-27 NOTE — Progress Notes (Signed)
Pre visit review using our clinic review tool, if applicable. No additional management support is needed unless otherwise documented below in the visit note. 

## 2016-09-27 NOTE — Progress Notes (Signed)
Patient presents to clinic today c/o 3 weeks of sinus pressure, sinus pain, sore throat. Endorses R-sided tooth pain and R ear pressure with pain. Endorses intermittent low-grade fever at home. Denies recent travel or sick contact. Patient with history of sinus infections. Has taken Mucinex and Advil Cold and sinus. .   Past Medical History:  Diagnosis Date  . Abdominal pain 03/18/2016  . Allergy   . Anemia    in the past  . Anxiety   . Anxiety disorder   . Bipolar depression ([place]) 05/29/2016  . Breast lesion 12/06/2015  . Chicken pox   . Complication of anesthesia 1999   urinary retention  . Depression    in teh past- ok now  . Diarrhea 03/18/2016  . Fibromyalgia   . GERD (gastroesophageal reflux disease)   . Hyperglycemia 03/18/2016  . Hyperlipidemia 08/07/2015  . Left thyroid nodule 03/31/2013  . Melanoma ([place])    right hip and knee  . Migraine   . Neck pain on right side 12/23/2012  . Obesity   . Pain 03/31/2013  . Palpitations 08/10/2014  . Shortness of breath    when anemic  . Sinusitis 08/07/2015  . Skin lesion of breast 12/06/2015  . TIA (transient ischemic attack) 03/31/2013  . UTI (lower urinary tract infection)     Current Outpatient Prescriptions on File Prior to Visit  Medication Sig Dispense Refill  . ARIPiprazole (ABILIFY) 2 MG tablet Take 1 tablet (2 mg total) by mouth at bedtime. 30 tablet 1  . carisoprodol (SOMA) 350 MG tablet Take 1 tablet (350 mg total) by mouth 2 (two) times daily as needed for muscle spasms. 60 tablet 0  . cholecalciferol (VITAMIN D) 1000 units tablet Take 2,000 Units by mouth daily.    . diazepam (VALIUM) 10 MG tablet Take 1 tablet (10 mg total) by mouth every 12 (twelve) hours as needed for anxiety or sleep. 40 tablet 3  . gabapentin (NEURONTIN) 300 MG capsule 2 caps three times a day 180 capsule 5  . HYDROcodone-acetaminophen (NORCO) 10-325 MG tablet Take 1 tablet by mouth every 8 (eight) hours as needed. 60 tablet 0  . levocetirizine  (XYZAL) 5 MG tablet Take 1 tablet (5 mg total) by mouth every evening. 90 tablet 1  . pantoprazole (PROTONIX) 40 MG tablet TAKE 1 TABLET (40 MG TOTAL) BY MOUTH 2 TIMES DAILY. 180 tablet 2  . venlafaxine XR (EFFEXOR XR) 150 MG 24 hr capsule Take 1 capsule (150 mg total) by mouth daily with breakfast. 30 capsule 5  . Vitamin D, Ergocalciferol, (DRISDOL) 50000 units CAPS capsule TAKE 1 CAPSULE BY MOUTH EVERY 7 DAYS 4 capsule 0   No current facility-administered medications on file prior to visit.     Allergies  Allergen Reactions  . Seroquel [Quetiapine Fumarate] Nausea And Vomiting    Nightmares and vomiting   . Adhesive [Tape] Dermatitis    Band- Aid adhesive  . Aleve [Naproxen Sodium]     Makes hands, legs, and feet swell  . Carbamazepine     Hallucinations and dizziness    Family History  Problem Relation Age of Onset  . Colon cancer Neg Hx   . Colitis Father     crohn's  . Heart disease Father     grandfather  . Heart attack Father 48  . Arthritis Father     s/p back surgery  . Diabetes      grandfather  . Irritable bowel syndrome    . Kidney  disease      grandfather  . Arthritis Other   . Cancer Other     breast  . Hyperlipidemia Other   . Mental illness Other   . Other Mother     neurological autoimmune disease, chorea in trunk  . Emphysema Maternal Uncle   . Arthritis Maternal Grandmother   . Lupus Maternal Grandmother   . Dementia Maternal Grandmother   . CVA Maternal Grandmother   . Arthritis Paternal Grandmother   . Heart disease Paternal Grandmother     chf  . Diabetes Paternal Grandfather   . Kidney disease Paternal Grandfather   . Hypertension Paternal Grandfather   . Hyperlipidemia Paternal Grandfather   . Cancer Maternal Grandfather     bone    Social History   Social History  . Marital status: Married    Spouse name: N/A  . Number of children: N/A  . Years of education: N/A   Occupational History  . unemployed    Social History Main  Topics  . Smoking status: Never Smoker  . Smokeless tobacco: Never Used     Comment: never used tobacco  . Alcohol use No  . Drug use: No  . Sexual activity: Not Currently    Birth control/ protection: None   Other Topics Concern  . None   Social History Narrative   Daily caffeine   Review of Systems - See HPI.  All other ROS are negative.  BP (!) 124/98   Pulse 96   Temp 98.3 F (36.8 C) (Oral)   Resp 16   Ht 5\' 8"  (1.727 m)   Wt (!) 323 lb (146.5 kg)   LMP 08/13/2012   SpO2 98%   BMI 49.11 kg/m   Physical Exam  Constitutional: She is oriented to person, place, and time.  HENT:  Head: Normocephalic and atraumatic.  Right Ear: Tympanic membrane normal.  Left Ear: Tympanic membrane normal.  Nose: Mucosal edema and rhinorrhea present. Right sinus exhibits maxillary sinus tenderness. Left sinus exhibits maxillary sinus tenderness.  Mouth/Throat: Uvula is midline, oropharynx is clear and moist and mucous membranes are normal.  Eyes: Conjunctivae are normal.  Neck: Neck supple.  Cardiovascular: Normal rate, regular rhythm, normal heart sounds and intact distal pulses.   Pulmonary/Chest: Effort normal and breath sounds normal. No respiratory distress. She has no wheezes. She has no rales. She exhibits no tenderness.  Lymphadenopathy:    She has no cervical adenopathy.  Neurological: She is alert and oriented to person, place, and time.  Skin: Skin is warm and dry. No rash noted.  Vitals reviewed.  Recent Results (from the past 2160 hour(s))  IBC panel     Status: Abnormal   Collection Time: 07/05/16 10:25 AM  Result Value Ref Range   Iron 82 42 - 145 ug/dL   Transferrin 211.0 (L) 212.0 - 360.0 mg/dL   Saturation Ratios 27.8 20.0 - 50.0 %  CBC     Status: None   Collection Time: 07/05/16 10:25 AM  Result Value Ref Range   WBC 9.4 4.0 - 10.5 K/uL   RBC 4.52 3.87 - 5.11 Mil/uL   Platelets 279.0 150.0 - 400.0 K/uL   Hemoglobin 14.2 12.0 - 15.0 g/dL   HCT 42.0 36.0 -  46.0 %   MCV 92.8 78.0 - 100.0 fl   MCHC 33.9 30.0 - 36.0 g/dL   RDW 14.0 11.5 - 15.5 %  Comprehensive metabolic panel     Status: Abnormal   Collection Time: 07/05/16  10:25 AM  Result Value Ref Range   Sodium 138 135 - 145 mEq/L   Potassium 4.2 3.5 - 5.1 mEq/L   Chloride 101 96 - 112 mEq/L   CO2 27 19 - 32 mEq/L   Glucose, Bld 99 70 - 99 mg/dL   BUN 15 6 - 23 mg/dL   Creatinine, Ser 0.80 0.40 - 1.20 mg/dL   Total Bilirubin 0.5 0.2 - 1.2 mg/dL   Alkaline Phosphatase 84 39 - 117 U/L   AST 26 0 - 37 U/L   ALT 44 (H) 0 - 35 U/L   Total Protein 7.0 6.0 - 8.3 g/dL   Albumin 4.1 3.5 - 5.2 g/dL   Calcium 9.2 8.4 - 10.5 mg/dL   GFR 82.08 >60.00 mL/min  Lipid panel     Status: Abnormal   Collection Time: 07/05/16 10:25 AM  Result Value Ref Range   Cholesterol 210 (H) 0 - 200 mg/dL    Comment: ATP III Classification       Desirable:  < 200 mg/dL               Borderline High:  200 - 239 mg/dL          High:  > = 240 mg/dL   Triglycerides 204.0 (H) 0.0 - 149.0 mg/dL    Comment: Normal:  <150 mg/dLBorderline High:  150 - 199 mg/dL   HDL 41.30 >39.00 mg/dL   VLDL 40.8 (H) 0.0 - 40.0 mg/dL   Total CHOL/HDL Ratio 5     Comment:                Men          Women1/2 Average Risk     3.4          3.3Average Risk          5.0          4.42X Average Risk          9.6          7.13X Average Risk          15.0          11.0                       NonHDL 168.45     Comment: NOTE:  Non-HDL goal should be 30 mg/dL higher than patient's LDL goal (i.e. LDL goal of < 70 mg/dL, would have non-HDL goal of < 100 mg/dL)  TSH     Status: None   Collection Time: 07/05/16 10:25 AM  Result Value Ref Range   TSH 2.54 0.35 - 4.50 uIU/mL  VITAMIN D 25 Hydroxy (Vit-D Deficiency, Fractures)     Status: None   Collection Time: 07/05/16 10:25 AM  Result Value Ref Range   VITD 30.79 30.00 - 100.00 ng/mL  Sedimentation rate     Status: Abnormal   Collection Time: 07/05/16 10:25 AM  Result Value Ref Range   Sed Rate  21 (H) 0 - 20 mm/hr  LDL cholesterol, direct     Status: None   Collection Time: 07/05/16 10:25 AM  Result Value Ref Range   Direct LDL 146.0 mg/dL    Comment: Optimal:  <100 mg/dLNear or Above Optimal:  100-129 mg/dLBorderline High:  130-159 mg/dLHigh:  160-189 mg/dLVery High:  >190 mg/dL  Rheumatoid factor     Status: None   Collection Time: 07/29/16 10:59 AM  Result Value Ref Range   Rhuematoid  fact SerPl-aCnc <14 <14 IU/mL  ANA     Status: Abnormal   Collection Time: 07/29/16 10:59 AM  Result Value Ref Range   Anit Nuclear Antibody(ANA) POS (A) NEGATIVE  Anti-nuclear ab-titer (ANA titer)     Status: Abnormal   Collection Time: 07/29/16 10:59 AM  Result Value Ref Range   ANA Pattern 1 CENTROMERE (A)    ANA Titer 1 1:640 (H) titer    Comment:           Reference Range           < 1:40      Negative             1:40-1:80 Low Antibody level           > 1:80      Elevated Antibody level     Assessment/Plan: 1. Acute non-recurrent maxillary sinusitis Rx Augmentin.  Increase fluids.  Rest.  Saline nasal spray.  Probiotic.  Mucinex as directed.  Humidifier in bedroom.  Call or return to clinic if symptoms are not improving.  - amoxicillin-clavulanate (AUGMENTIN) 875-125 MG tablet; Take 1 tablet by mouth 2 (two) times daily.  Dispense: 14 tablet; Refill: 0   Leeanne Rio, [place]

## 2016-09-27 NOTE — Patient Instructions (Signed)
Please take antibiotic as directed.  Increase fluid intake.  Use Saline nasal spray.  Take a daily multivitamin. Continue Mucinex as directed.  Place a humidifier in the bedroom.  Please call or return clinic if symptoms are not improving.  Sinusitis Sinusitis is redness, soreness, and swelling (inflammation) of the paranasal sinuses. Paranasal sinuses are air pockets within the bones of your face (beneath the eyes, the middle of the forehead, or above the eyes). In healthy paranasal sinuses, mucus is able to drain out, and air is able to circulate through them by way of your nose. However, when your paranasal sinuses are inflamed, mucus and air can become trapped. This can allow bacteria and other germs to grow and cause infection. Sinusitis can develop quickly and last only a short time (acute) or continue over a long period (chronic). Sinusitis that lasts for more than 12 weeks is considered chronic.  CAUSES  Causes of sinusitis include:  Allergies.  Structural abnormalities, such as displacement of the cartilage that separates your nostrils (deviated septum), which can decrease the air flow through your nose and sinuses and affect sinus drainage.  Functional abnormalities, such as when the small hairs (cilia) that line your sinuses and help remove mucus do not work properly or are not present. SYMPTOMS  Symptoms of acute and chronic sinusitis are the same. The primary symptoms are pain and pressure around the affected sinuses. Other symptoms include:  Upper toothache.  Earache.  Headache.  Bad breath.  Decreased sense of smell and taste.  A cough, which worsens when you are lying flat.  Fatigue.  Fever.  Thick drainage from your nose, which often is green and may contain pus (purulent).  Swelling and warmth over the affected sinuses. DIAGNOSIS  Your caregiver will perform a physical exam. During the exam, your caregiver may:  Look in your nose for signs of abnormal growths  in your nostrils (nasal polyps).  Tap over the affected sinus to check for signs of infection.  View the inside of your sinuses (endoscopy) with a special imaging device with a light attached (endoscope), which is inserted into your sinuses. If your caregiver suspects that you have chronic sinusitis, one or more of the following tests may be recommended:  Allergy tests.  Nasal culture A sample of mucus is taken from your nose and sent to a lab and screened for bacteria.  Nasal cytology A sample of mucus is taken from your nose and examined by your caregiver to determine if your sinusitis is related to an allergy. TREATMENT  Most cases of acute sinusitis are related to a viral infection and will resolve on their own within 10 days. Sometimes medicines are prescribed to help relieve symptoms (pain medicine, decongestants, nasal steroid sprays, or saline sprays).  However, for sinusitis related to a bacterial infection, your caregiver will prescribe antibiotic medicines. These are medicines that will help kill the bacteria causing the infection.  Rarely, sinusitis is caused by a fungal infection. In theses cases, your caregiver will prescribe antifungal medicine. For some cases of chronic sinusitis, surgery is needed. Generally, these are cases in which sinusitis recurs more than 3 times per year, despite other treatments. HOME CARE INSTRUCTIONS   Drink plenty of water. Water helps thin the mucus so your sinuses can drain more easily.  Use a humidifier.  Inhale steam 3 to 4 times a day (for example, sit in the bathroom with the shower running).  Apply a warm, moist washcloth to your face 3 to  4 times a day, or as directed by your caregiver.  Use saline nasal sprays to help moisten and clean your sinuses.  Take over-the-counter or prescription medicines for pain, discomfort, or fever only as directed by your caregiver. SEEK IMMEDIATE MEDICAL CARE IF:  You have increasing pain or severe  headaches.  You have nausea, vomiting, or drowsiness.  You have swelling around your face.  You have vision problems.  You have a stiff neck.  You have difficulty breathing. MAKE SURE YOU:   Understand these instructions.  Will watch your condition.  Will get help right away if you are not doing well or get worse. Document Released: 07/04/2005 Document Revised: 09/26/2011 Document Reviewed: 07/19/2011 [facility] Patient Information 2014 [place], [place].

## 2016-09-28 LAB — CP5000020 ENA PANEL
ENA SM AB SER-ACNC: NEGATIVE
Ribonucleic Protein(ENA) Antibody, IgG: <1
SCLERODERMA (SCL-70) (ENA) ANTIBODY, IGG: NEGATIVE
SSA (Ro) (ENA) Antibody, IgG: <1
SSB (LA) (ENA) ANTIBODY, IGG: NEGATIVE
ds DNA Ab: 2 IU/mL

## 2016-09-28 LAB — PAN-ANCA: ANCA Screen: NEGATIVE

## 2016-09-29 ENCOUNTER — Encounter: Payer: Self-pay | Admitting: Physician Assistant

## 2016-09-29 LAB — CRYOGLOBULIN

## 2016-09-29 NOTE — Telephone Encounter (Signed)
Spoke with patient regarding symptoms. Patient reports vomiting twice this morning, once clear liquid with the most recent (10am) being "neon yellow bile". Patient denies fever, abdominal pain or diarrhea. Patient has a h/o reflux and has taken medication routinely. Advised patient to attempt to eat saltine crackers and gatorade (patient was attempting this prior to phone call). Patient states she is going to see how she feels after eating and will call her PCP office to schedule an appointment for this afternoon in case she is not feeling better. Patient advised if symptoms get worse or she feels she needs to be seen immediately, to go to the Emergency Department or Urgent Care. Patient verbalized understanding.

## 2016-09-29 NOTE — Telephone Encounter (Signed)
Please call and triage the patient. Could potentially be vomiting due to antibiotic but needs assessment.  If ER triage not indicated, she needs an appointment today. I am only here this morning so if she cannot see [place], she needs to be seen at her PCP's office.

## 2016-09-29 NOTE — Progress Notes (Signed)
Labs normal.

## 2016-10-03 ENCOUNTER — Ambulatory Visit: Payer: Self-pay

## 2016-10-03 ENCOUNTER — Other Ambulatory Visit: Payer: Self-pay | Admitting: Family Medicine

## 2016-10-03 MED ORDER — CARISOPRODOL 350 MG PO TABS: 350.0000 mg | ORAL_TABLET | Freq: Two times a day (BID) | ORAL | 1 refills | Status: DC | PRN

## 2016-10-03 MED ORDER — HYDROCODONE-ACETAMINOPHEN 10-325 MG PO TABS: 1.0000 | ORAL_TABLET | Freq: Three times a day (TID) | ORAL | 0 refills | Status: DC | PRN

## 2016-10-03 NOTE — Telephone Encounter (Signed)
Have her sign contract and she can have refills

## 2016-10-03 NOTE — Telephone Encounter (Signed)
Requesting:   Hydrocodone and soma Contract  None UDS  Low risk--next is not due until 01/26/17 Last OV    07/05/2016 Last Refill   Hydrocodone #60 on 09/01/16                     Soma #60 on 08/30/16  Please Advise

## 2016-10-04 MED ORDER — DOXYCYCLINE HYCLATE 100 MG PO CAPS: 100.0000 mg | ORAL_CAPSULE | Freq: Two times a day (BID) | ORAL | 0 refills | Status: DC

## 2016-10-04 NOTE — Telephone Encounter (Signed)
Called the patient informed to pickup hardcopy for hydrocodone and soma at the front desk.

## 2016-10-15 ENCOUNTER — Other Ambulatory Visit: Payer: Self-pay | Admitting: Family Medicine

## 2016-10-19 ENCOUNTER — Other Ambulatory Visit: Payer: Self-pay | Admitting: Family Medicine

## 2016-10-20 DIAGNOSIS — M5033 Other cervical disc degeneration, cervicothoracic region: Secondary | ICD-10-CM | POA: Diagnosis not present

## 2016-10-20 DIAGNOSIS — M9901 Segmental and somatic dysfunction of cervical region: Secondary | ICD-10-CM | POA: Diagnosis not present

## 2016-10-21 DIAGNOSIS — R768 Other specified abnormal immunological findings in serum: Secondary | ICD-10-CM | POA: Insufficient documentation

## 2016-10-21 NOTE — Progress Notes (Signed)
Office Visit Note  Patient: [name]             Date of Birth: 1971/02/06           MRN: [id number]             PCP: Penni Homans, MD Referring: Mosie Lukes, MD Visit Date: 10/28/2016 Occupation: '@GUAROCC'$ @    Subjective:  Neck and lower back pain.   History of Present Illness: [name] is a 46 y.o. female with history of positive ANA, osteoarthritis and disc disease. According to patient she's been going to a chiropractor now and she's been getting C-spine and lumbar spine manipulations. She has noticed some improvement with that. She continues to have fatigue and insomnia. She also complains of mild Raynaud's symptoms. She continues to have discomfort in her hands and her feet. Her knee joint continues to hurt as well.  Activities of Daily Living:  Patient reports morning stiffness for 2 hours.   Patient Reports nocturnal pain.  Difficulty dressing/grooming: Denies Difficulty climbing stairs: Reports Difficulty getting out of chair: Reports Difficulty using hands for taps, buttons, cutlery, and/or writing: Reports   Review of Systems  Constitutional: Positive for fatigue. Negative for night sweats, weight gain, weight loss and weakness.  HENT: Positive for mouth dryness. Negative for mouth sores, trouble swallowing, trouble swallowing and nose dryness.   Eyes: Positive for dryness. Negative for pain, redness and visual disturbance.  Respiratory: Negative for cough, shortness of breath and difficulty breathing.   Cardiovascular: Negative for chest pain, palpitations, hypertension, irregular heartbeat and swelling in legs/feet.  Gastrointestinal: Positive for diarrhea. Negative for blood in stool and constipation.       Post cholecystectomy  Endocrine: Negative for increased urination.  Genitourinary: Negative for vaginal dryness.  Musculoskeletal: Positive for arthralgias, joint pain, myalgias, morning stiffness and myalgias. Negative for joint swelling,  muscle weakness and muscle tenderness.  Skin: Positive for color change and sensitivity to sunlight. Negative for rash, hair loss, skin tightness and ulcers.  Allergic/Immunologic: Negative for susceptible to infections.  Neurological: Negative for dizziness, memory loss and night sweats.  Hematological: Negative for swollen glands.  Psychiatric/Behavioral: Positive for depressed mood and sleep disturbance. The patient is nervous/anxious.     PMFS History:  Patient Active Problem List   Diagnosis Date Noted  . History of melanoma 10/28/2016  . DJD (degenerative joint disease), cervical 10/25/2016  . 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 Primary osteoarthritis of both hands 10/25/2016  . Primary osteoarthritis of left knee 10/25/2016  . Fibromyalgia 10/25/2016  . Allergic rhinitis with a probable non-allergic component 10/24/2016  . ANA positive 10/21/2016  . Elevated sed rate 05/29/2016  . Bipolar depression ([place]) 05/29/2016  . Fever 05/23/2016  . Other complicated headache syndrome 05/23/2016  . Rash and nonspecific skin eruption 05/23/2016  . Hyperglycemia 03/18/2016  . Diarrhea 03/18/2016  . Abdominal pain 03/18/2016  . Perimenopausal 01/31/2016  . Vitamin D deficiency 01/31/2016  . Myalgia 01/31/2016  . Skin lesion of breast 12/06/2015  . Dysphagia 08/12/2015  . Peptic stricture of esophagus 08/12/2015  . Hyperlipidemia 08/07/2015  . Left foot pain 08/07/2015  . Recurrent sinusitis 08/07/2015  . Back pain 08/19/2014  . Palpitations 08/10/2014  . Multiple thyroid nodules, dominant nodule left lobe 1.8 cm 03/31/2013  . Pain 03/31/2013  . Neck pain on right side 12/23/2012  . Fibroids 07/04/2012  . Menorrhagia 06/25/2012  . Hypertriglyceridemia 04/23/2012  . Annual physical exam 12/15/2011  . Obesity 12/10/2010  .  Lower extremity edema 12/10/2010  . Iron deficiency anemia 08/18/2009  . GERD 08/18/2009    Past Medical History:  Diagnosis Date  . Abdominal pain 03/18/2016    . Allergy   . Anemia    in the past  . Anxiety   . Anxiety disorder   . Bipolar depression ([place]) 05/29/2016  . Breast lesion 12/06/2015  . Chicken pox   . Complication of anesthesia 1999   urinary retention  . Depression    in teh past- ok now  . Diarrhea 03/18/2016  . Fibromyalgia   . GERD (gastroesophageal reflux disease)   . Hyperglycemia 03/18/2016  . Hyperlipidemia 08/07/2015  . Left thyroid nodule 03/31/2013  . Melanoma ([place])    right hip and knee  . Migraine   . Neck pain on right side 12/23/2012  . Obesity   . Pain 03/31/2013  . Palpitations 08/10/2014  . Shortness of breath    when anemic  . Sinusitis 08/07/2015  . Skin lesion of breast 12/06/2015  . TIA (transient ischemic attack) 03/31/2013  . UTI (lower urinary tract infection)     Family History  Problem Relation Age of Onset  . Colitis Father     crohn's  . Heart disease Father     grandfather  . Heart attack Father 42  . Arthritis Father     s/p back surgery  . Other Mother     neurological autoimmune disease, chorea in trunk  . Eczema Mother   . Asthma Mother   . Arthritis Maternal Grandmother   . Lupus Maternal Grandmother   . Dementia Maternal Grandmother   . CVA Maternal Grandmother   . Arthritis Paternal Grandmother   . Heart disease Paternal Grandmother     chf  . Asthma Paternal Grandmother   . Diabetes Paternal Grandfather   . Kidney disease Paternal Grandfather   . Hypertension Paternal Grandfather   . Hyperlipidemia Paternal Grandfather   . Cancer Maternal Grandfather     bone  . Diabetes      grandfather  . Irritable bowel syndrome    . Kidney disease      grandfather  . Arthritis Other   . Cancer Other     breast  . Hyperlipidemia Other   . Mental illness Other   . Emphysema Maternal Uncle   . Colon cancer Neg Hx   . Allergic rhinitis Neg Hx   . Angioedema Neg Hx   . Immunodeficiency Neg Hx   . Urticaria Neg Hx    Past Surgical History:  Procedure Laterality Date  .  ABDOMINAL HYSTERECTOMY    . BACK SURGERY    . BILATERAL SALPINGECTOMY Bilateral 09/13/2012   Procedure: BILATERAL SALPINGECTOMY;  Surgeon: Lavonia Drafts, MD;  Location: [facility];  Service: Gynecology;  Laterality: Bilateral;  . CHOLECYSTECTOMY    . COLONOSCOPY    . LAPAROSCOPIC ASSISTED VAGINAL HYSTERECTOMY N/A 09/13/2012   Procedure: LAPAROSCOPIC ASSISTED VAGINAL HYSTERECTOMY;  Surgeon: Lavonia Drafts, MD;  Location: [facility];  Service: Gynecology;  Laterality: N/A;  . MELANOMA EXCISION  2011   stage I and II  . WISDOM TOOTH EXTRACTION  1989   Social History   Social History Narrative   Daily caffeine     Objective: Vital Signs: BP 130/78   Pulse 80   Resp 14   Wt (!) 320 lb (145.2 kg)   LMP 08/13/2012   BMI 50.12 kg/m    Physical Exam  Constitutional: She is oriented to person, place, and time.  She appears well-developed and well-nourished.  HENT:  Head: Normocephalic and atraumatic.  Eyes: Conjunctivae and EOM are normal.  Neck: Normal range of motion.  Cardiovascular: Normal rate, regular rhythm, normal heart sounds and intact distal pulses.   Pulmonary/Chest: Effort normal and breath sounds normal.  Abdominal: Soft. Bowel sounds are normal.  Lymphadenopathy:    She has no cervical adenopathy.  Neurological: She is alert and oriented to person, place, and time.  Skin: Skin is warm and dry. Capillary refill takes 2 to 3 seconds.  Psychiatric: She has a normal mood and affect. Her behavior is normal.  Nursing note and vitals reviewed.    Musculoskeletal Exam: C-spine some discomfort range of motion lumbar spine some discomfort range of motion. Shoulder joints elbow joints wrist joint MCPs PIPs DIPs with good range of motion with no synovitis hip joints knee joints ankles MTPs PIPs  DIPs with good range of motion with no synovitis. Fibromyalgia tender points were 6 out of 18 positive. She had some hyperalgesia.  CDAI Exam: No CDAI exam completed.     Investigation: Findings:   09/23/2016 CK normal, RF negative, ANA 1:640 centromere, ENA negative, C3-C4 normal, beta-2 negative, anticardiolipin negative, lupus anticoagulant negative, cryoglobulins normal, and ANCA negative    Office Visit on 10/27/2016  Component Date Value Ref Range Status  . Pneumo Ab Type 1* 10/27/2016 WILL FOLLOW   Preliminary  . Pneumo Ab Type 3* 10/27/2016 WILL FOLLOW   Preliminary  . Pneumo Ab Type 4* 10/27/2016 WILL FOLLOW   Preliminary  . Pneumo Ab Type 8* 10/27/2016 WILL FOLLOW   Preliminary  . Pneumo Ab Type 9 (9N)* 10/27/2016 WILL FOLLOW   Preliminary  . Pneumo Ab Type 12 (70F)* 10/27/2016 WILL FOLLOW   Preliminary  . Pneumo Ab Type 14* 10/27/2016 WILL FOLLOW   Preliminary  . Pneumo Ab Type 17 (105F)* 10/27/2016 WILL FOLLOW   Preliminary  . Pneumo Ab Type 19 (59F)* 10/27/2016 WILL FOLLOW   Preliminary  . Pneumo Ab Type 2* 10/27/2016 WILL FOLLOW   Preliminary  . Pneumo Ab Type 20* 10/27/2016 WILL FOLLOW   Preliminary  . Pneumo Ab Type 22 (55F)* 10/27/2016 WILL FOLLOW   Preliminary  . Pneumo Ab Type 23 (11F)* 10/27/2016 WILL FOLLOW   Preliminary  . Pneumo Ab Type 26 (6B)* 10/27/2016 WILL FOLLOW   Preliminary  . Pneumo Ab Type 34 (10A)* 10/27/2016 WILL FOLLOW   Preliminary  . Pneumo Ab Type 43 (11A)* 10/27/2016 WILL FOLLOW   Preliminary  . Pneumo Ab Type 5* 10/27/2016 WILL FOLLOW   Preliminary  . Pneumo Ab Type 51 (14F)* 10/27/2016 WILL FOLLOW   Preliminary  . Pneumo Ab Type 54 (15B)* 10/27/2016 WILL FOLLOW   Preliminary  . Pneumo Ab Type 56 (18C)* 10/27/2016 WILL FOLLOW   Preliminary  . Pneumo Ab Type 57 (19A)* 10/27/2016 WILL FOLLOW   Preliminary  . Pneumo Ab Type 68 (9V)* 10/27/2016 WILL FOLLOW   Preliminary  . Pneumo Ab Type 70 (3F)* 10/27/2016 WILL FOLLOW   Preliminary  . Tetanus Ab, IgG 10/27/2016 WILL FOLLOW   Preliminary  . Diphtheria Ab 10/27/2016 WILL FOLLOW   Preliminary  . WBC 10/27/2016 8.6  3.4 - 10.8 x10E3/uL Final  . RBC 10/27/2016  4.68  3.77 - 5.28 x10E6/uL Final  . Hemoglobin 10/27/2016 14.1  11.1 - 15.9 g/dL Final  . Hematocrit 93/26/7124 41.8  34.0 - 46.6 % Final  . MCV 10/27/2016 89  79 - 97 fL Final  . MCH 10/27/2016 30.1  26.6 - 33.0 pg Final  . MCHC 10/27/2016 33.7  31.5 - 35.7 g/dL Final  . RDW 96/45/2739 13.7  12.3 - 15.4 % Final  . Platelets 10/27/2016 322  150 - 379 x10E3/uL Final  . Neutrophils 10/27/2016 70  Not Estab. % Final  . Lymphs 10/27/2016 23  Not Estab. % Final  . Monocytes 10/27/2016 6  Not Estab. % Final  . Eos 10/27/2016 1  Not Estab. % Final  . Basos 10/27/2016 0  Not Estab. % Final  . Neutrophils Absolute 10/27/2016 6.0  1.4 - 7.0 x10E3/uL Final  . Lymphocytes Absolute 10/27/2016 2.0  0.7 - 3.1 x10E3/uL Final  . Monocytes Absolute 10/27/2016 0.5  0.1 - 0.9 x10E3/uL Final  . EOS (ABSOLUTE) 10/27/2016 0.1  0.0 - 0.4 x10E3/uL Final  . Basophils Absolute 10/27/2016 0.0  0.0 - 0.2 x10E3/uL Final  . Immature Granulocytes 10/27/2016 0  Not Estab. % Final  . Immature Grans (Abs) 10/27/2016 0.0  0.0 - 0.1 x10E3/uL Final  . IgG (Immunoglobin G), Serum 10/27/2016 938  700 - 1,600 mg/dL Final  . IgA/Immunoglobulin A, Serum 10/27/2016 214  87 - 352 mg/dL Final  . IgM (Immunoglobulin M), Srm 10/27/2016 127  26 - 217 mg/dL Final  . Please note 95/91/6198 Comment   Final   Comment: The date and/or time of collection was not indicated on the requisition as required by state and federal law.  The date of receipt of the specimen was used as the collection date if not supplied.   Office Visit on 09/23/2016  Component Date Value Ref Range Status  . ds DNA Ab 09/23/2016 2  IU/mL Final   Comment:                                 IU/mL       Interpretation                               < or = 4    Negative                               5-9         Indeterminate                               > or = 10   Positive     . Ribonucleic Protein(ENA) Antibody,* 09/23/2016 <1.0 NEG  <1.0 NEG AI Final  .  Scleroderma (Scl-70) (ENA) Antibod* 09/23/2016 <1.0 NEG  <1.0 NEG AI Final  . SSA (Ro) (ENA) Antibody, IgG 09/23/2016 <1.0 NEG  <1.0 NEG AI Final  . SSB (La) (ENA) Antibody, IgG 09/23/2016 <1.0 NEG  <1.0 NEG AI Final  . ENA SM Ab Ser-aCnc 09/23/2016 <1.0 NEG  <1.0 NEG AI Final  . C3 Complement 09/23/2016 193  83 - 193 mg/dL Final  . C4 Complement 09/23/2016 33  15 - 57 mg/dL Final  . Phospholipids 09/23/2016 219  151 - 264 mg/dL Final   Comment: This test was performed using a kit that has not been cleared or approved by the FDA.  The analytical performance characteristics of this test have been determined by [facility], [place], [place].  This test should not be used for diagnosis without  confirmation by other medically established means.   . Lupus Anticoagulant Eval 09/23/2016 REPORT   Final   Comment: A Lupus Anticoagulant is not detected. Reference Range:  Not Detected http://education.questdiagnostics.com/faq/LupusAnticoag ------------------------------------------------------- This interpretation is based on the following test results.   . Beta-2 Glyco I IgG 09/23/2016 9  <=20 [facility]U Final  . Beta-2-Glycoprotein I IgM 09/23/2016 <9  <=20 [facility]U Final  . Beta-2-Glycoprotein I IgA 09/23/2016 <9  <=20 [facility]U Final   Comment:   The Antiphospholipid Antibody Syndrome (APS) is a clinical-pathologic correlation that includes a clinical event (e.g. thrombosis, pregnancy loss, thrombocytopenia) and persistent positive Antiphospholipid Antibodies (IgM or IgG ACA >40 MPL/GPL, IgM or IgG anti-B2GPI antibodies, or a Lupus Anticoagulant). The IgA isotype has been implicated in smaller studies, but have not yet been incorporated into the APS criteria. International consensus guidelines suggest waiting at least 12 weeks before retesting to confirm antibody persistence. Reference J Thromb Haemost 2006: 4; 295   For more information on this test, go  to http://education.questdiagnostics.com/faq/FAQ109   . Total CK 09/23/2016 71  7 - 177 U/L Final  . ANCA Screen 09/23/2016 Negative   Final   Comment:             ** Normal Reference Range: Negative **   ANCA Screen includes evaluation for p-ANCA, c-ANCA and Atypical p-ANCA.   Marland Kitchen Myeloperoxidase Abs 09/23/2016 <1.0  <1.0 AI Final   Comment:                                 Value   Interpretation                              <1.0 AI: No Antibody Detected                           >or=1.0 AI: Antibody Detected   Autoantibodies to myeloperoxidase (MPO) are commonly associated with the following small-vessel vasculitides: microscopic polyangiitis, polyarteritis nodosa, Churg-Strauss syndrome, necrotizing and crescentic glomerulonephritis and occasionally Wegener's granulomatosis. The perinuclear IFA pattern, (p-ANCA), is based largely on autoantibody to myeloperoxidase which serves as the primary antigen   These autoantibodies are present in active disease state.     . Serine Protease 3 09/23/2016 <1.0  <1.0 AI Final   Comment:                                 Value   Interpretation                              <1.0 AI: No Antibody Detected                           >or=1.0 AI: Antibody Detected   Autoantibodies to proteinase-3 (PR-3) are accepted as characteristic for granulomatosis with polyangiitis (Wegener's), and are detectable in 95% of the histologically proven cases. The cytoplasmic IFA pattern, (c-ANCA), is based largely on autoantibody to PR-3 which serves as the primary antigen.   These autoantibodies are present in active disease state.     . Cryoglobulin 09/23/2016 REPORT   Final   Comment: Cryoglobulin, QL Analysis  None Detected  Reference range:  None Detected      Imaging: No results found.  Speciality Comments: No specialty comments available.    Procedures:  No procedures performed Allergies: Seroquel [quetiapine  fumarate]; Adhesive [tape]; Aleve [naproxen sodium]; Carbamazepine; and Mold extract [trichophyton]   Assessment / Plan:     Visit Diagnoses: ANA positive - 1:640 centromere, ENA negative, history of mild Raynauds, oral ulcers, fatigue. At this point she does not have features of a specific autoimmune disease. I did discuss that centromere pattern could be associated with scleroderma. I do not see any sclerodactyly on examination. She has mild Raynauds and does not need any treatment at this time. She gives history of intermittent swelling in her hands. I will schedule ultrasound of her bilateral hands to look for synovitis. No treatment was initiated at this point. I would like to observe her over time. If she develops any new symptoms she supposed to notify me.  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  Fibromyalgia: Good sleep hygiene, need for regular exercise was discussed.  Insomnia: I discussed possible sleep study with patient she will discuss that with her PCP. She also has fatigue problem list insomnia.  Obesity: Weight loss diet and exercise was discussed at length.   History of melanoma   She also has history of : Bipolar depression (HCC):t  Other medical problems are listed as follows:   Hyperlipidemia, unspecified hyperlipidemia type  Multiple thyroid nodules, dominant nodule left lobe 1.8 cm  Gastroesophageal reflux disease, esophagitis presence not specified   Family history of lupus erythematosus - Maternal grandmother per patient   Orders: No orders of the defined types were placed in this encounter.  No orders of  the defined types were placed in this encounter.   Face-to-face time spent with patient was 0 minutes. 50% of time was spent in counseling and coordination of care.  Follow-Up Instructions: Return in about 6 months (around 04/29/2017) for positive ANA.   Bo Merino, MD  Note - This record has been created using Editor, commissioning.  Chart creation errors have been sought, but may not always  have been located. Such creation errors do not reflect on  the standard of medical care.

## 2016-10-24 ENCOUNTER — Ambulatory Visit (INDEPENDENT_AMBULATORY_CARE_PROVIDER_SITE_OTHER): Payer: BLUE CROSS/BLUE SHIELD | Admitting: Allergy and Immunology

## 2016-10-24 ENCOUNTER — Encounter: Payer: Self-pay | Admitting: Allergy and Immunology

## 2016-10-24 VITALS — BP 126/76 | HR 88 | Temp 97.9°F | Resp 14 | Ht 67.0 in | Wt 323.2 lb

## 2016-10-24 DIAGNOSIS — J3089 Other allergic rhinitis: Secondary | ICD-10-CM | POA: Diagnosis not present

## 2016-10-24 DIAGNOSIS — M5033 Other cervical disc degeneration, cervicothoracic region: Secondary | ICD-10-CM | POA: Diagnosis not present

## 2016-10-24 DIAGNOSIS — R768 Other specified abnormal immunological findings in serum: Secondary | ICD-10-CM

## 2016-10-24 DIAGNOSIS — J329 Chronic sinusitis, unspecified: Secondary | ICD-10-CM

## 2016-10-24 DIAGNOSIS — M9901 Segmental and somatic dysfunction of cervical region: Secondary | ICD-10-CM | POA: Diagnosis not present

## 2016-10-24 MED ORDER — AZELASTINE-FLUTICASONE 137-50 MCG/ACT NA SUSP: 1.0000 | Freq: Two times a day (BID) | NASAL | 3 refills | Status: DC

## 2016-10-24 NOTE — Assessment & Plan Note (Addendum)
   Treatment plan as outlined above for chronic rhinitis.  To be thorough, immunocompetence will be assessed with labs. The following labs have been ordered: CBC with differential, IgG, IgA and IgM, tetanus IgG titers, and pneumococcal IgG titers.  If pre-vaccination titers are low, post-vaccination titers will be drawn to assess response.    The patient will be called with further recommendations and follow-up instructions once the labs have returned.

## 2016-10-24 NOTE — Progress Notes (Signed)
New Patient Note  RE: [name] MRN: [id number] DOB: 1971/03/08 Date of Office Visit: 10/24/2016  Referring provider: Mosie Lukes, MD Primary care provider: Penni Homans, MD  Chief Complaint: Nasal Congestion; Sinus Problem; and Other (fatigue, myalgias)   History of present illness: [name] is a 46 y.o. female seen today in consultation requested by Penni Homans, MD.  Over the past 2 years she has experienced nasal congestion, rhinorrhea, sneezing, postnasal drainage, and sinus pressure.   No specific environmental triggers have been identified.  She states that she has 4-6 sinus infections per year requiring antibiotics and is followed by her otolaryngologist, Dr. Wilburn Cornelia.  She also complains of fatigue "beyond belief" as well as "all over body pain", weakness in the extremities, tingling in the hands and feet, the sensation of throbbing veins, and headaches.  She has a positive ANA, 1:640, as well as elevated ESR and will be following up with her rheumatologist later this week to discuss additional laboratory results.  She is curious if allergies may be contributing to her systemic symptoms.   Assessment and plan: Allergic rhinitis with a probable non-allergic component  A prescription has been provided for Dymista nasal spray, one spray per nostril 1-2 times daily as needed. Proper nasal spray technique has been discussed and demonstrated.  Nasal saline lavage (NeilMed) has been recommended as needed and prior to medicated nasal sprays along with instructions for proper administration.  For thick post nasal drainage, nasal congestion, and/or sinus pressure, add guaifenesin [phone] mg (Mucinex) plus/minus pseudoephedrine 60-120 mg  twice daily as needed with adequate hydration as discussed. Pseudoephedrine is only to be used for short-term relief of nasal/sinus congestion. Long-term use is discouraged due to potential side effects.  Recurrent  sinusitis  Treatment plan as outlined above for chronic rhinitis.  To be thorough, immunocompetence will be assessed with labs. The following labs have been ordered: CBC with differential, IgG, IgA and IgM, tetanus IgG titers, and pneumococcal IgG titers.  If pre-vaccination titers are low, post-vaccination titers will be drawn to assess response.    The patient will be called with further recommendations and follow-up instructions once the labs have returned.   Meds ordered this encounter  Medications  . Azelastine-Fluticasone 137-50 MCG/ACT SUSP    Sig: Place 1 spray into the nose 2 (two) times daily.    Dispense:  23 g    Refill:  3    Diagnostics: Epicutaneous testing:  Negative despite a positive histamine control. Intradermal testing: Positive to molds.    Physical examination: Blood pressure 126/76, pulse 88, temperature 97.9 F (36.6 C), temperature source Oral, resp. rate 14, height _0  (1.702 m), weight (!) 323 lb 3.2 oz (146.6 kg), last menstrual period 08/13/2012, SpO2 96 %.  General: Alert, interactive, in no acute distress. HEENT: TMs pearly gray, turbinates moderately edematous without discharge, post-pharynx mildly erythematous. Neck: Supple without lymphadenopathy. Lungs: Clear to auscultation without wheezing, rhonchi or rales. CV: Normal S1, S2 without murmurs. Abdomen: Nondistended, nontender. Skin: Warm and dry, without lesions or rashes. Extremities:  No clubbing, cyanosis or edema. Neuro:   Grossly intact.  Review of systems:  Review of systems negative except as noted in HPI / PMHx or noted below: Review of Systems  Constitutional: Negative.   HENT: Negative.   Eyes: Negative.   Respiratory: Negative.   Cardiovascular: Negative.   Gastrointestinal: Negative.   Genitourinary: Negative.   Musculoskeletal: Negative.   Skin: Negative.   Neurological: Negative.  Endo/Heme/Allergies: Negative.   Psychiatric/Behavioral: Negative.     Past  medical history:  Past Medical History:  Diagnosis Date  . Abdominal pain 03/18/2016  . Allergy   . Anemia    in the past  . Anxiety   . Anxiety disorder   . Bipolar depression ([place]) 05/29/2016  . Breast lesion 12/06/2015  . Chicken pox   . Complication of anesthesia 1999   urinary retention  . Depression    in teh past- ok now  . Diarrhea 03/18/2016  . Fibromyalgia   . GERD (gastroesophageal reflux disease)   . Hyperglycemia 03/18/2016  . Hyperlipidemia 08/07/2015  . Left thyroid nodule 03/31/2013  . Melanoma ([place])    right hip and knee  . Migraine   . Neck pain on right side 12/23/2012  . Obesity   . Pain 03/31/2013  . Palpitations 08/10/2014  . Shortness of breath    when anemic  . Sinusitis 08/07/2015  . Skin lesion of breast 12/06/2015  . TIA (transient ischemic attack) 03/31/2013  . UTI (lower urinary tract infection)     Past surgical history:  Past Surgical History:  Procedure Laterality Date  . ABDOMINAL HYSTERECTOMY    . BACK SURGERY    . BILATERAL SALPINGECTOMY Bilateral 09/13/2012   Procedure: BILATERAL SALPINGECTOMY;  Surgeon: Lavonia Drafts, MD;  Location: [facility];  Service: Gynecology;  Laterality: Bilateral;  . CHOLECYSTECTOMY    . COLONOSCOPY    . LAPAROSCOPIC ASSISTED VAGINAL HYSTERECTOMY N/A 09/13/2012   Procedure: LAPAROSCOPIC ASSISTED VAGINAL HYSTERECTOMY;  Surgeon: Lavonia Drafts, MD;  Location: [facility];  Service: Gynecology;  Laterality: N/A;  . MELANOMA EXCISION  2011   stage I and II  . WISDOM TOOTH EXTRACTION  1989    Family history: Family History  Problem Relation Age of Onset  . Colitis Father     crohn's  . Heart disease Father     grandfather  . Heart attack Father 102  . Arthritis Father     s/p back surgery  . Other Mother     neurological autoimmune disease, chorea in trunk  . Eczema Mother   . Asthma Mother   . Arthritis Maternal Grandmother   . Lupus Maternal Grandmother   . Dementia Maternal Grandmother   . CVA  Maternal Grandmother   . Arthritis Paternal Grandmother   . Heart disease Paternal Grandmother     chf  . Asthma Paternal Grandmother   . Diabetes Paternal Grandfather   . Kidney disease Paternal Grandfather   . Hypertension Paternal Grandfather   . Hyperlipidemia Paternal Grandfather   . Cancer Maternal Grandfather     bone  . Diabetes      grandfather  . Irritable bowel syndrome    . Kidney disease      grandfather  . Arthritis Other   . Cancer Other     breast  . Hyperlipidemia Other   . Mental illness Other   . Emphysema Maternal Uncle   . Colon cancer Neg Hx   . Allergic rhinitis Neg Hx   . Angioedema Neg Hx   . Immunodeficiency Neg Hx   . Urticaria Neg Hx     Social history: Social History   Social History  . Marital status: Married    Spouse name: N/A  . Number of children: N/A  . Years of education: N/A   Occupational History  . unemployed    Social History Main Topics  . Smoking status: Never Smoker  . Smokeless tobacco: Never  Used     Comment: never used tobacco  . Alcohol use No  . Drug use: No  . Sexual activity: Not Currently    Birth control/ protection: None   Other Topics Concern  . Not on file   Social History Narrative   Daily caffeine   Environmental History: The patient lives in a 47 year old house with carpeting throughout and central air/heat.  There is a dog in house which has access to her bedroom.  She is nonsmoker.  There is no known mold/water damage in the home.  Allergies as of 10/24/2016      Reactions   Seroquel [quetiapine Fumarate] Nausea And Vomiting   Nightmares and vomiting    Adhesive [tape] Dermatitis   Band- Aid adhesive   Aleve [naproxen Sodium]    Makes hands, legs, and feet swell   Carbamazepine    Hallucinations and dizziness      Medication List       Accurate as of 10/24/16  7:00 PM. Always use your most recent med list.          ARIPiprazole 2 MG tablet Commonly known as:  ABILIFY Take 1 tablet  (2 mg total) by mouth at bedtime.   Azelastine-Fluticasone 137-50 MCG/ACT Susp Place 1 spray into the nose 2 (two) times daily.   cholecalciferol 1000 units tablet Commonly known as:  VITAMIN D Take 2,000 Units by mouth daily.   diazepam 10 MG tablet Commonly known as:  VALIUM Take 1 tablet (10 mg total) by mouth every 12 (twelve) hours as needed for anxiety or sleep.   gabapentin 300 MG capsule Commonly known as:  NEURONTIN 2 caps three times a day   levocetirizine 5 MG tablet Commonly known as:  XYZAL Take 1 tablet (5 mg total) by mouth every evening.   pantoprazole 40 MG tablet Commonly known as:  PROTONIX TAKE 1 TABLET(40 MG) BY MOUTH TWICE DAILY   venlafaxine XR 150 MG 24 hr capsule Commonly known as:  EFFEXOR XR Take 1 capsule (150 mg total) by mouth daily with breakfast.   Vitamin D (Ergocalciferol) 50000 units Caps capsule Commonly known as:  DRISDOL TAKE 1 CAPSULE BY MOUTH ONCE WEEKLY       Known medication allergies: Allergies  Allergen Reactions  . Seroquel [Quetiapine Fumarate] Nausea And Vomiting    Nightmares and vomiting   . Adhesive [Tape] Dermatitis    Band- Aid adhesive  . Aleve [Naproxen Sodium]     Makes hands, legs, and feet swell  . Carbamazepine     Hallucinations and dizziness    I appreciate the opportunity to take part in [facility] care. Please do not hesitate to contact me with questions.  Sincerely,   R. Edgar Frisk, MD

## 2016-10-24 NOTE — Assessment & Plan Note (Addendum)
   A prescription has been provided for Dymista nasal spray, one spray per nostril 1-2 times daily as needed. Proper nasal spray technique has been discussed and demonstrated.  Nasal saline lavage (NeilMed) has been recommended as needed and prior to medicated nasal sprays along with instructions for proper administration.  For thick post nasal drainage, nasal congestion, and/or sinus pressure, add guaifenesin [phone] mg (Mucinex) plus/minus pseudoephedrine 60-120 mg  twice daily as needed with adequate hydration as discussed. Pseudoephedrine is only to be used for short-term relief of nasal/sinus congestion. Long-term use is discouraged due to potential side effects.

## 2016-10-24 NOTE — Patient Instructions (Addendum)
Allergic rhinitis with a probable non-allergic component  A prescription has been provided for Dymista nasal spray, one spray per nostril 1-2 times daily as needed. Proper nasal spray technique has been discussed and demonstrated.  Nasal saline lavage (NeilMed) has been recommended as needed and prior to medicated nasal sprays along with instructions for proper administration.  For thick post nasal drainage, nasal congestion, and/or sinus pressure, add guaifenesin [phone] mg (Mucinex) plus/minus pseudoephedrine 60-120 mg  twice daily as needed with adequate hydration as discussed. Pseudoephedrine is only to be used for short-term relief of nasal/sinus congestion. Long-term use is discouraged due to potential side effects.  Recurrent sinusitis  Treatment plan as outlined above for chronic rhinitis.  To be thorough, immunocompetence will be assessed with labs. The following labs have been ordered: CBC with differential, IgG, IgA and IgM, tetanus IgG titers, and pneumococcal IgG titers.  If pre-vaccination titers are low, post-vaccination titers will be drawn to assess response.    The patient will be called with further recommendations and follow-up instructions once the labs have returned.   When lab results have returned the patient will be called with further recommendations and follow up instructions.

## 2016-10-25 DIAGNOSIS — M797 Fibromyalgia: Secondary | ICD-10-CM | POA: Insufficient documentation

## 2016-10-25 DIAGNOSIS — M19042 Primary osteoarthritis, left hand: Secondary | ICD-10-CM

## 2016-10-25 DIAGNOSIS — M19041 Primary osteoarthritis, right hand: Secondary | ICD-10-CM | POA: Insufficient documentation

## 2016-10-25 DIAGNOSIS — M47816 Spondylosis without myelopathy or radiculopathy, lumbar region: Secondary | ICD-10-CM | POA: Insufficient documentation

## 2016-10-25 DIAGNOSIS — M47812 Spondylosis without myelopathy or radiculopathy, cervical region: Secondary | ICD-10-CM | POA: Insufficient documentation

## 2016-10-25 DIAGNOSIS — M1712 Unilateral primary osteoarthritis, left knee: Secondary | ICD-10-CM | POA: Insufficient documentation

## 2016-10-26 DIAGNOSIS — M9901 Segmental and somatic dysfunction of cervical region: Secondary | ICD-10-CM | POA: Diagnosis not present

## 2016-10-26 DIAGNOSIS — M5033 Other cervical disc degeneration, cervicothoracic region: Secondary | ICD-10-CM | POA: Diagnosis not present

## 2016-10-27 ENCOUNTER — Encounter: Payer: Self-pay | Admitting: Family Medicine

## 2016-10-27 ENCOUNTER — Ambulatory Visit (INDEPENDENT_AMBULATORY_CARE_PROVIDER_SITE_OTHER): Payer: BLUE CROSS/BLUE SHIELD | Admitting: Family Medicine

## 2016-10-27 VITALS — BP 134/86 | HR 85 | Temp 98.3°F | Resp 16 | Ht 67.0 in | Wt 320.4 lb

## 2016-10-27 DIAGNOSIS — J329 Chronic sinusitis, unspecified: Secondary | ICD-10-CM | POA: Diagnosis not present

## 2016-10-27 DIAGNOSIS — M5033 Other cervical disc degeneration, cervicothoracic region: Secondary | ICD-10-CM | POA: Diagnosis not present

## 2016-10-27 DIAGNOSIS — F3130 Bipolar disorder, current episode depressed, mild or moderate severity, unspecified: Secondary | ICD-10-CM | POA: Diagnosis not present

## 2016-10-27 DIAGNOSIS — E559 Vitamin D deficiency, unspecified: Secondary | ICD-10-CM

## 2016-10-27 DIAGNOSIS — F319 Bipolar disorder, unspecified: Secondary | ICD-10-CM

## 2016-10-27 DIAGNOSIS — R700 Elevated erythrocyte sedimentation rate: Secondary | ICD-10-CM

## 2016-10-27 DIAGNOSIS — R739 Hyperglycemia, unspecified: Secondary | ICD-10-CM | POA: Diagnosis not present

## 2016-10-27 DIAGNOSIS — M9901 Segmental and somatic dysfunction of cervical region: Secondary | ICD-10-CM | POA: Diagnosis not present

## 2016-10-27 MED ORDER — VENLAFAXINE HCL ER 150 MG PO CP24: 150.0000 mg | ORAL_CAPSULE | Freq: Every day | ORAL | 3 refills | Status: DC

## 2016-10-27 MED ORDER — CETIRIZINE HCL 10 MG PO TABS: 10.0000 mg | ORAL_TABLET | Freq: Every day | ORAL | 3 refills | Status: DC

## 2016-10-27 MED ORDER — MONTELUKAST SODIUM 10 MG PO TABS: 10.0000 mg | ORAL_TABLET | Freq: Every day | ORAL | 3 refills | Status: DC

## 2016-10-27 NOTE — Assessment & Plan Note (Signed)
Encouraged heart healthy diet, increase exercise, avoid trans fats, consider a krill oil cap daily 

## 2016-10-27 NOTE — Assessment & Plan Note (Signed)
Improved with chiropractic adjustments

## 2016-10-27 NOTE — Assessment & Plan Note (Signed)
Encouraged DASH diet, decrease po intake and increase exercise as tolerated. Needs 7-8 hours of sleep nightly. Avoid trans fats, eat small, frequent meals every 4-5 hours with lean proteins, complex carbs and healthy fats. Minimize simple carbs, bariatric referral 

## 2016-10-27 NOTE — Patient Instructions (Addendum)
Take a half a tablet of Abilify for one month.  Then a half a tablet every other day for one month then stop.   Take Probiotic and Benefiber twice a day for loose stools.   Diarrhea, Adult Diarrhea is frequent loose and watery bowel movements. Diarrhea can make you feel weak and cause you to become dehydrated. Dehydration can make you tired and thirsty, cause you to have a dry mouth, and decrease how often you urinate. Diarrhea typically lasts 2-3 days. However, it can last longer if it is a sign of something more serious. It is important to treat your diarrhea as told by your health care provider. Follow these instructions at home: Eating and drinking   Follow these recommendations as told by your health care provider:  Take an oral rehydration solution (ORS). This is a drink that is sold at pharmacies and retail stores.  Drink clear fluids, such as water, ice chips, diluted fruit juice, and low-calorie sports drinks.  Eat bland, easy-to-digest foods in small amounts as you are able. These foods include bananas, applesauce, rice, lean meats, toast, and crackers.  Avoid drinking fluids that contain a lot of sugar or caffeine, such as energy drinks, sports drinks, and soda.  Avoid alcohol.  Avoid spicy or fatty foods. General instructions   Drink enough fluid to keep your urine clear or pale yellow.  Wash your hands often. If soap and water are not available, use hand sanitizer.  Make sure that all people in your household wash their hands well and often.  Take over-the-counter and prescription medicines only as told by your health care provider.  Rest at home while you recover.  Watch your condition for any changes.  Take a warm bath to relieve any burning or pain from frequent diarrhea episodes.  Keep all follow-up visits as told by your health care provider. This is important. Contact a health care provider if:  You have a fever.  Your diarrhea gets worse.  You have new  symptoms.  You cannot keep fluids down.  You feel light-headed or dizzy.  You have a headache  You have muscle cramps. Get help right away if:  You have chest pain.  You feel extremely weak or you faint.  You have bloody or black stools or stools that look like tar.  You have severe pain, cramping, or bloating in your abdomen.  You have trouble breathing or you are breathing very quickly.  Your heart is beating very quickly.  Your skin feels cold and clammy.  You feel confused.  You have signs of dehydration, such as:  Dark urine, very little urine, or no urine.  Cracked lips.  Dry mouth.  Sunken eyes.  Sleepiness.  Weakness. This information is not intended to replace advice given to you by your health care provider. Make sure you discuss any questions you have with your health care provider. Document Released: 06/24/2002 Document Revised: 11/12/2015 Document Reviewed: 03/10/2015 Elsevier Interactive Patient Education  2017 [employer].

## 2016-10-27 NOTE — Progress Notes (Signed)
Pre visit review using our clinic review tool, if applicable. No additional management support is needed unless otherwise documented below in the visit note. 

## 2016-10-27 NOTE — Progress Notes (Signed)
Patient ID: [name], female   DOB: 07-08-1971, 46 y.o.   MRN: [id number]     Subjective:  I acted as a Education administrator for Dr. Charlett Blake.  Guerry Bruin, [place].   Patient ID: [name], female    DOB: [birth year], 46 y.o.   MRN: [id number]  Chief Complaint  Patient presents with  . medication follow up    would like to stop abilify    HPI  Patient is in today she would like to stop abilify.  She has been on it for about a month and feels like it is not doing anything.  She feels like she is gaining weight.  She would also like to wean off of the Effexor.  She feels like she is doing very well now.  She is now seeing a Restaurant manager, fast food, Michel Harrow in [place],  which is helping with her pain.  She would like to have some lab drawn for allergist, Dr Ulla Potash for sinusitis.  Has been some vomiting that looks like bile. She reports a good improvement in her neck pain with chiropractic pain but she does note persistent diffuse arthralgias and myalgias. No recent febrile illness or hospitalization. She reports her mood is improved and whils she still notes some anxiety it is improved. Depression is improved, she feels more hopeful. Denies CP/palp/SOB/HA/congestion/fevers/GI or GU c/o. Taking meds as prescribed  Patient Care Team: Mosie Lukes, MD as PCP - General (Family Medicine) Kathlee Nations, MD (Psychiatry) Irene Shipper, MD (Gastroenterology) Nira Retort, MD (Hematology and Oncology) Coralie Keens, MD (General Surgery)   Past Medical History:  Diagnosis Date  . Abdominal pain 03/18/2016  . Allergy   . Anemia    in the past  . Anxiety   . Anxiety disorder   . Bipolar depression ([place]) 05/29/2016  . Breast lesion 12/06/2015  . Chicken pox   . Complication of anesthesia 1999   urinary retention  . Depression    in teh past- ok now  . Diarrhea 03/18/2016  . Fibromyalgia   . GERD (gastroesophageal reflux disease)   . Hyperglycemia 03/18/2016  . Hyperlipidemia 08/07/2015  . Left  thyroid nodule 03/31/2013  . Melanoma ([place])    right hip and knee  . Migraine   . Neck pain on right side 12/23/2012  . Obesity   . Pain 03/31/2013  . Palpitations 08/10/2014  . Shortness of breath    when anemic  . Sinusitis 08/07/2015  . Skin lesion of breast 12/06/2015  . TIA (transient ischemic attack) 03/31/2013  . UTI (lower urinary tract infection)     Past Surgical History:  Procedure Laterality Date  . ABDOMINAL HYSTERECTOMY    . BACK SURGERY    . BILATERAL SALPINGECTOMY Bilateral 09/13/2012   Procedure: BILATERAL SALPINGECTOMY;  Surgeon: Lavonia Drafts, MD;  Location: [facility];  Service: Gynecology;  Laterality: Bilateral;  . CHOLECYSTECTOMY    . COLONOSCOPY    . LAPAROSCOPIC ASSISTED VAGINAL HYSTERECTOMY N/A 09/13/2012   Procedure: LAPAROSCOPIC ASSISTED VAGINAL HYSTERECTOMY;  Surgeon: Lavonia Drafts, MD;  Location: [facility];  Service: Gynecology;  Laterality: N/A;  . MELANOMA EXCISION  2011   stage I and II  . WISDOM TOOTH EXTRACTION  1989    Family History  Problem Relation Age of Onset  . Colitis Father     crohn's  . Heart disease Father     grandfather  . Heart attack Father 70  . Arthritis Father     s/p back surgery  .  Other Mother     neurological autoimmune disease, chorea in trunk  . Eczema Mother   . Asthma Mother   . Arthritis Maternal Grandmother   . Lupus Maternal Grandmother   . Dementia Maternal Grandmother   . CVA Maternal Grandmother   . Arthritis Paternal Grandmother   . Heart disease Paternal Grandmother     chf  . Asthma Paternal Grandmother   . Diabetes Paternal Grandfather   . Kidney disease Paternal Grandfather   . Hypertension Paternal Grandfather   . Hyperlipidemia Paternal Grandfather   . Cancer Maternal Grandfather     bone  . Diabetes      grandfather  . Irritable bowel syndrome    . Kidney disease      grandfather  . Arthritis Other   . Cancer Other     breast  . Hyperlipidemia Other   . Mental illness Other    . Emphysema Maternal Uncle   . Colon cancer Neg Hx   . Allergic rhinitis Neg Hx   . Angioedema Neg Hx   . Immunodeficiency Neg Hx   . Urticaria Neg Hx     Social History   Social History  . Marital status: Married    Spouse name: N/A  . Number of children: N/A  . Years of education: N/A   Occupational History  . unemployed    Social History Main Topics  . Smoking status: Never Smoker  . Smokeless tobacco: Never Used     Comment: never used tobacco  . Alcohol use No  . Drug use: No  . Sexual activity: Not Currently    Birth control/ protection: None   Other Topics Concern  . Not on file   Social History Narrative   Daily caffeine    Outpatient Medications Prior to Visit  Medication Sig Dispense Refill  . cholecalciferol (VITAMIN D) 1000 units tablet Take 2,000 Units by mouth daily.    . diazepam (VALIUM) 10 MG tablet Take 1 tablet (10 mg total) by mouth every 12 (twelve) hours as needed for anxiety or sleep. 40 tablet 3  . gabapentin (NEURONTIN) 300 MG capsule 2 caps three times a day 180 capsule 5  . pantoprazole (PROTONIX) 40 MG tablet TAKE 1 TABLET(40 MG) BY MOUTH TWICE DAILY 180 tablet 0  . Vitamin D, Ergocalciferol, (DRISDOL) 50000 units CAPS capsule TAKE 1 CAPSULE BY MOUTH ONCE WEEKLY 4 capsule 4  . venlafaxine XR (EFFEXOR XR) 150 MG 24 hr capsule Take 1 capsule (150 mg total) by mouth daily with breakfast. 30 capsule 5  . ARIPiprazole (ABILIFY) 2 MG tablet Take 1 tablet (2 mg total) by mouth at bedtime. 30 tablet 1  . levocetirizine (XYZAL) 5 MG tablet Take 1 tablet (5 mg total) by mouth every evening. (Patient not taking: Reported on 10/24/2016) 90 tablet 1  . Azelastine-Fluticasone 137-50 MCG/ACT SUSP Place 1 spray into the nose 2 (two) times daily. 23 g 3   No facility-administered medications prior to visit.     Allergies  Allergen Reactions  . Seroquel [Quetiapine Fumarate] Nausea And Vomiting    Nightmares and vomiting   . Adhesive [Tape] Dermatitis      Band- Aid adhesive  . Aleve [Naproxen Sodium]     Makes hands, legs, and feet swell  . Carbamazepine     Hallucinations and dizziness  . Mold Extract [Trichophyton]     Review of Systems  Constitutional: Positive for malaise/fatigue. Negative for fever.  HENT: Negative for congestion.   Eyes:  Negative for blurred vision.  Respiratory: Negative for cough and shortness of breath.   Cardiovascular: Negative for chest pain, palpitations and leg swelling.  Gastrointestinal: Negative for vomiting.  Musculoskeletal: Positive for back pain, myalgias and neck pain. Negative for joint pain.  Skin: Negative for rash.  Neurological: Negative for loss of consciousness and headaches.  Psychiatric/Behavioral: Negative for depression. The patient is not nervous/anxious.        Objective:    Physical Exam  Constitutional: She is oriented to person, place, and time. She appears well-developed and well-nourished. No distress.  HENT:  Head: Normocephalic and atraumatic.  Eyes: Conjunctivae are normal.  Neck: Normal range of motion. No thyromegaly present.  Cardiovascular: Normal rate and regular rhythm.   Pulmonary/Chest: Effort normal and breath sounds normal. She has no wheezes.  Abdominal: Soft. Bowel sounds are normal. There is no tenderness.  Musculoskeletal: Normal range of motion. She exhibits no edema or deformity.  Neurological: She is alert and oriented to person, place, and time.  Skin: Skin is warm and dry. She is not diaphoretic.  Psychiatric: She has a normal mood and affect.    BP 134/86 (BP Location: Right Arm, Cuff Size: Large)   Pulse 85   Temp 98.3 F (36.8 C) (Oral)   Resp 16   Ht 5\' 7"  (1.702 m)   Wt (!) 320 lb 6.4 oz (145.3 kg)   LMP 08/13/2012   SpO2 99%   BMI 50.18 kg/m  Wt Readings from Last 3 Encounters:  10/28/16 (!) 320 lb (145.2 kg)  10/27/16 (!) 320 lb 6.4 oz (145.3 kg)  10/24/16 (!) 323 lb 3.2 oz (146.6 kg)   BP Readings from Last 3 Encounters:   10/28/16 130/78  10/27/16 134/86  10/24/16 126/76     Immunization History  Administered Date(s) Administered  . Influenza,inj,Quad PF,36+ Mos 06/05/2013  . Influenza-Unspecified 07/29/2015  . PPD Test 06/05/2013  . Tdap 12/10/2010, 06/05/2013    Health Maintenance  Topic Date Due  . PAP SMEAR  05/01/2015  . INFLUENZA VACCINE  02/15/2017  . TETANUS/TDAP  06/06/2023  . HIV Screening  Completed    Lab Results  Component Value Date   WBC 8.6 10/27/2016   HGB 14.2 07/05/2016   HCT 41.8 10/27/2016   PLT 322 10/27/2016   GLUCOSE 99 07/05/2016   CHOL 210 (H) 07/05/2016   TRIG 204.0 (H) 07/05/2016   HDL 41.30 07/05/2016   LDLDIRECT 146.0 07/05/2016   LDLCALC 114 (H) 09/26/2013   ALT 44 (H) 07/05/2016   AST 26 07/05/2016   NA 138 07/05/2016   K 4.2 07/05/2016   CL 101 07/05/2016   CREATININE 0.80 07/05/2016   BUN 15 07/05/2016   CO2 27 07/05/2016   TSH 2.54 07/05/2016   INR 0.98 09/03/2010   HGBA1C 5.3 03/18/2016    Lab Results  Component Value Date   TSH 2.54 07/05/2016   Lab Results  Component Value Date   WBC 8.6 10/27/2016   HGB 14.2 07/05/2016   HCT 41.8 10/27/2016   MCV 89 10/27/2016   PLT 322 10/27/2016   Lab Results  Component Value Date   NA 138 07/05/2016   K 4.2 07/05/2016   CO2 27 07/05/2016   GLUCOSE 99 07/05/2016   BUN 15 07/05/2016   CREATININE 0.80 07/05/2016   BILITOT 0.5 07/05/2016   ALKPHOS 84 07/05/2016   AST 26 07/05/2016   ALT 44 (H) 07/05/2016   PROT 7.0 07/05/2016   ALBUMIN 4.1 07/05/2016   CALCIUM  9.2 07/05/2016   GFR 82.08 07/05/2016   Lab Results  Component Value Date   CHOL 210 (H) 07/05/2016   Lab Results  Component Value Date   HDL 41.30 07/05/2016   Lab Results  Component Value Date   LDLCALC 114 (H) 09/26/2013   Lab Results  Component Value Date   TRIG 204.0 (H) 07/05/2016   Lab Results  Component Value Date   CHOLHDL 5 07/05/2016   Lab Results  Component Value Date   HGBA1C 5.3 03/18/2016          Assessment & Plan:   Problem List Items Addressed This Visit    Vitamin D deficiency   Relevant Orders   VITAMIN D 25 Hydroxy (Vit-D Deficiency, Fractures) (Completed)   Hyperglycemia    minimize simple carbs. Increase exercise as tolerated.       Elevated sed rate    Encouraged to eat heart healthy diet with minimal processed foods, decreased carbohydrates and add a fatty acid supplement and a baby aspirin, increase exercise.       Relevant Orders   Sedimentation rate (Completed)   Bipolar depression ([place])    Patient doing much better but frustrated with weight gain so will try to taper off Abilify at her request to see how she does. Continue Effexor for now.       Relevant Medications   venlafaxine XR (EFFEXOR XR) 150 MG 24 hr capsule    Other Visit Diagnoses    Sinusitis, unspecified chronicity, unspecified location    -  Primary   Relevant Medications   cetirizine (ZYRTEC) 10 MG tablet   Other Relevant Orders   Pneumococcal Ab (23 Serotype) (Completed)   Tetanus/Diphtheria Ab (Completed)   CBC with Differential/Platelet (Completed)   IgG, IgA, IgM (Completed)      I have discontinued Ms. Finklea's Azelastine-Fluticasone. I am also having her start on cetirizine and montelukast. Additionally, I am having her maintain her diazepam, levocetirizine, ARIPiprazole, gabapentin, cholecalciferol, Vitamin D (Ergocalciferol), pantoprazole, and venlafaxine XR.  Meds ordered this encounter  Medications  . cetirizine (ZYRTEC) 10 MG tablet    Sig: Take 1 tablet (10 mg total) by mouth daily.    Dispense:  30 tablet    Refill:  3  . montelukast (SINGULAIR) 10 MG tablet    Sig: Take 1 tablet (10 mg total) by mouth at bedtime.    Dispense:  30 tablet    Refill:  3  . venlafaxine XR (EFFEXOR XR) 150 MG 24 hr capsule    Sig: Take 1 capsule (150 mg total) by mouth daily with breakfast.    Dispense:  30 capsule    Refill:  3    CMA served as scribe during this visit.  History, Physical and Plan performed by medical provider. Documentation and orders reviewed and attested to.  Penni Homans, MD

## 2016-10-28 ENCOUNTER — Ambulatory Visit (INDEPENDENT_AMBULATORY_CARE_PROVIDER_SITE_OTHER): Payer: BLUE CROSS/BLUE SHIELD | Admitting: Rheumatology

## 2016-10-28 ENCOUNTER — Other Ambulatory Visit: Payer: Self-pay | Admitting: Family Medicine

## 2016-10-28 ENCOUNTER — Encounter: Payer: Self-pay | Admitting: Family Medicine

## 2016-10-28 ENCOUNTER — Encounter: Payer: Self-pay | Admitting: Rheumatology

## 2016-10-28 VITALS — BP 130/78 | HR 80 | Resp 14 | Wt 320.0 lb

## 2016-10-28 DIAGNOSIS — E559 Vitamin D deficiency, unspecified: Secondary | ICD-10-CM

## 2016-10-28 DIAGNOSIS — R768 Other specified abnormal immunological findings in serum: Secondary | ICD-10-CM

## 2016-10-28 DIAGNOSIS — R700 Elevated erythrocyte sedimentation rate: Secondary | ICD-10-CM

## 2016-10-28 DIAGNOSIS — M47812 Spondylosis without myelopathy or radiculopathy, cervical region: Secondary | ICD-10-CM

## 2016-10-28 DIAGNOSIS — G4700 Insomnia, unspecified: Secondary | ICD-10-CM

## 2016-10-28 DIAGNOSIS — M5030 Other cervical disc degeneration, unspecified cervical region: Secondary | ICD-10-CM

## 2016-10-28 DIAGNOSIS — M19041 Primary osteoarthritis, right hand: Secondary | ICD-10-CM | POA: Diagnosis not present

## 2016-10-28 DIAGNOSIS — Z85820 Personal history of malignant melanoma of skin: Secondary | ICD-10-CM

## 2016-10-28 DIAGNOSIS — N9089 Other specified noninflammatory disorders of vulva and perineum: Secondary | ICD-10-CM

## 2016-10-28 DIAGNOSIS — M1712 Unilateral primary osteoarthritis, left knee: Secondary | ICD-10-CM

## 2016-10-28 DIAGNOSIS — G4710 Hypersomnia, unspecified: Secondary | ICD-10-CM

## 2016-10-28 DIAGNOSIS — M797 Fibromyalgia: Secondary | ICD-10-CM | POA: Diagnosis not present

## 2016-10-28 DIAGNOSIS — M47816 Spondylosis without myelopathy or radiculopathy, lumbar region: Secondary | ICD-10-CM

## 2016-10-28 DIAGNOSIS — G479 Sleep disorder, unspecified: Secondary | ICD-10-CM

## 2016-10-28 DIAGNOSIS — M19042 Primary osteoarthritis, left hand: Secondary | ICD-10-CM

## 2016-10-28 LAB — SEDIMENTATION RATE: Sed Rate: 35 mm/hr — ABNORMAL HIGH (ref 0–20)

## 2016-10-28 LAB — VITAMIN D 25 HYDROXY (VIT D DEFICIENCY, FRACTURES): VITD: 31.83 ng/mL (ref 30.00–100.00)

## 2016-10-30 NOTE — Assessment & Plan Note (Signed)
Encouraged to eat heart healthy diet with minimal processed foods, decreased carbohydrates and add a fatty acid supplement and a baby aspirin, increase exercise.

## 2016-10-30 NOTE — Assessment & Plan Note (Signed)
minimize simple carbs. Increase exercise as tolerated.  

## 2016-10-30 NOTE — Assessment & Plan Note (Signed)
Patient doing much better but frustrated with weight gain so will try to taper off Abilify at her request to see how she does. Continue Effexor for now.

## 2016-10-31 DIAGNOSIS — M5033 Other cervical disc degeneration, cervicothoracic region: Secondary | ICD-10-CM | POA: Diagnosis not present

## 2016-10-31 DIAGNOSIS — M9901 Segmental and somatic dysfunction of cervical region: Secondary | ICD-10-CM | POA: Diagnosis not present

## 2016-11-01 ENCOUNTER — Encounter: Payer: Self-pay | Admitting: Family Medicine

## 2016-11-02 DIAGNOSIS — M9901 Segmental and somatic dysfunction of cervical region: Secondary | ICD-10-CM | POA: Diagnosis not present

## 2016-11-02 DIAGNOSIS — M5033 Other cervical disc degeneration, cervicothoracic region: Secondary | ICD-10-CM | POA: Diagnosis not present

## 2016-11-03 DIAGNOSIS — M5033 Other cervical disc degeneration, cervicothoracic region: Secondary | ICD-10-CM | POA: Diagnosis not present

## 2016-11-03 DIAGNOSIS — M9901 Segmental and somatic dysfunction of cervical region: Secondary | ICD-10-CM | POA: Diagnosis not present

## 2016-11-03 LAB — CBC WITH DIFFERENTIAL/PLATELET
BASOS ABS: 0 10*3/uL (ref 0.0–0.2)
BASOS: 0 %
EOS (ABSOLUTE): 0.1 10*3/uL (ref 0.0–0.4)
EOS: 1 %
Hematocrit: 41.8 % (ref 34.0–46.6)
Hemoglobin: 14.1 g/dL (ref 11.1–15.9)
Immature Grans (Abs): 0 10*3/uL (ref 0.0–0.1)
Immature Granulocytes: 0 %
LYMPHS ABS: 2 10*3/uL (ref 0.7–3.1)
LYMPHS: 23 %
MCH: 30.1 pg (ref 26.6–33.0)
MCHC: 33.7 g/dL (ref 31.5–35.7)
MCV: 89 fL (ref 79–97)
MONOS ABS: 0.5 10*3/uL (ref 0.1–0.9)
Monocytes: 6 %
NEUTROS PCT: 70 %
Neutrophils Absolute: 6 10*3/uL (ref 1.4–7.0)
Platelets: 322 10*3/uL (ref 150–379)
RBC: 4.68 x10E6/uL (ref 3.77–5.28)
RDW: 13.7 % (ref 12.3–15.4)
WBC: 8.6 10*3/uL (ref 3.4–10.8)

## 2016-11-03 LAB — IGG, IGA, IGM
IGA/IMMUNOGLOBULIN A, SERUM: 214 mg/dL (ref 87–352)
IGG (IMMUNOGLOBIN G), SERUM: 938 mg/dL (ref 700–1600)
IgM (Immunoglobulin M), Srm: 127 mg/dL (ref 26–217)

## 2016-11-03 LAB — PNEUMOCOCCAL AB (23 SEROTYPE)
PNEUMO AB TYPE 12 (12F): 0.2 ug/mL — AB (ref >1.3)
PNEUMO AB TYPE 14: 0.9 ug/mL — AB (ref >1.3)
PNEUMO AB TYPE 19 (19F): 0.2 ug/mL — AB (ref >1.3)
PNEUMO AB TYPE 23 (23F): 0.3 ug/mL — AB (ref >1.3)
PNEUMO AB TYPE 43 (11A): 0.2 ug/mL — AB (ref >1.3)
PNEUMO AB TYPE 51 (7F): 0.7 ug/mL — AB (ref >1.3)
PNEUMO AB TYPE 70 (33F): 1.1 ug/mL — AB (ref >1.3)
PNEUMO AB TYPE 9 (9N): 0.2 ug/mL — AB (ref >1.3)
Pneumo Ab Type 17 (17F)*: 0.5 ug/mL — ABNORMAL LOW (ref >1.3)
Pneumo Ab Type 2*: 0.9 ug/mL — ABNORMAL LOW (ref >1.3)
Pneumo Ab Type 20*: 1 ug/mL — ABNORMAL LOW (ref >1.3)
Pneumo Ab Type 22 (22F)*: 0.2 ug/mL — ABNORMAL LOW (ref >1.3)
Pneumo Ab Type 26 (6B)*: 0.4 ug/mL — ABNORMAL LOW (ref >1.3)
Pneumo Ab Type 3*: 0.3 ug/mL — ABNORMAL LOW (ref >1.3)
Pneumo Ab Type 34 (10A)*: <0.1 ug/mL — ABNORMAL LOW (ref >1.3)
Pneumo Ab Type 54 (15B)*: 3.9 ug/mL (ref >1.3)
Pneumo Ab Type 56 (18C)*: 2 ug/mL (ref >1.3)
Pneumo Ab Type 57 (19A)*: 2.8 ug/mL (ref >1.3)
Pneumo Ab Type 68 (9V)*: 0.6 ug/mL — ABNORMAL LOW (ref >1.3)
Pneumo Ab Type 8*: 0.2 ug/mL — ABNORMAL LOW (ref >1.3)

## 2016-11-03 LAB — TETANUS/DIPHTHERIA AB
Diphtheria Ab: <0.1 IU/mL — ABNORMAL LOW (ref <0.10)
Tetanus Ab, IgG: 1.46 IU/mL (ref <0.10)

## 2016-11-03 LAB — PLEASE NOTE

## 2016-11-07 DIAGNOSIS — H5201 Hypermetropia, right eye: Secondary | ICD-10-CM | POA: Diagnosis not present

## 2016-11-07 DIAGNOSIS — M9901 Segmental and somatic dysfunction of cervical region: Secondary | ICD-10-CM | POA: Diagnosis not present

## 2016-11-07 DIAGNOSIS — H40013 Open angle with borderline findings, low risk, bilateral: Secondary | ICD-10-CM | POA: Diagnosis not present

## 2016-11-07 DIAGNOSIS — M5033 Other cervical disc degeneration, cervicothoracic region: Secondary | ICD-10-CM | POA: Diagnosis not present

## 2016-11-09 ENCOUNTER — Institutional Professional Consult (permissible substitution): Payer: Self-pay | Admitting: Pulmonary Disease

## 2016-11-14 ENCOUNTER — Other Ambulatory Visit: Payer: Self-pay | Admitting: Family Medicine

## 2016-11-14 ENCOUNTER — Encounter: Payer: Self-pay | Admitting: Family Medicine

## 2016-11-14 MED ORDER — FLUCONAZOLE 150 MG PO TABS: 150.0000 mg | ORAL_TABLET | Freq: Every day | ORAL | 0 refills | Status: DC

## 2016-11-15 DIAGNOSIS — M9901 Segmental and somatic dysfunction of cervical region: Secondary | ICD-10-CM | POA: Diagnosis not present

## 2016-11-15 DIAGNOSIS — M5033 Other cervical disc degeneration, cervicothoracic region: Secondary | ICD-10-CM | POA: Diagnosis not present

## 2016-11-17 DIAGNOSIS — D2262 Melanocytic nevi of left upper limb, including shoulder: Secondary | ICD-10-CM | POA: Diagnosis not present

## 2016-11-17 DIAGNOSIS — L905 Scar conditions and fibrosis of skin: Secondary | ICD-10-CM | POA: Diagnosis not present

## 2016-11-17 DIAGNOSIS — Z1283 Encounter for screening for malignant neoplasm of skin: Secondary | ICD-10-CM | POA: Diagnosis not present

## 2016-11-17 DIAGNOSIS — D225 Melanocytic nevi of trunk: Secondary | ICD-10-CM | POA: Diagnosis not present

## 2016-11-17 DIAGNOSIS — Z08 Encounter for follow-up examination after completed treatment for malignant neoplasm: Secondary | ICD-10-CM | POA: Diagnosis not present

## 2016-11-17 DIAGNOSIS — Z85820 Personal history of malignant melanoma of skin: Secondary | ICD-10-CM | POA: Diagnosis not present

## 2016-11-17 DIAGNOSIS — D485 Neoplasm of uncertain behavior of skin: Secondary | ICD-10-CM | POA: Diagnosis not present

## 2016-11-23 ENCOUNTER — Encounter: Payer: Self-pay | Admitting: Obstetrics & Gynecology

## 2016-11-23 ENCOUNTER — Ambulatory Visit (INDEPENDENT_AMBULATORY_CARE_PROVIDER_SITE_OTHER): Payer: BLUE CROSS/BLUE SHIELD | Admitting: Obstetrics & Gynecology

## 2016-11-23 VITALS — BP 118/80 | HR 88 | Ht 68.0 in | Wt 318.0 lb

## 2016-11-23 DIAGNOSIS — M9901 Segmental and somatic dysfunction of cervical region: Secondary | ICD-10-CM | POA: Diagnosis not present

## 2016-11-23 DIAGNOSIS — Z1231 Encounter for screening mammogram for malignant neoplasm of breast: Secondary | ICD-10-CM

## 2016-11-23 DIAGNOSIS — Z1239 Encounter for other screening for malignant neoplasm of breast: Secondary | ICD-10-CM

## 2016-11-23 DIAGNOSIS — N907 Vulvar cyst: Secondary | ICD-10-CM

## 2016-11-23 DIAGNOSIS — N951 Menopausal and female climacteric states: Secondary | ICD-10-CM

## 2016-11-23 DIAGNOSIS — Z01419 Encounter for gynecological examination (general) (routine) without abnormal findings: Secondary | ICD-10-CM

## 2016-11-23 DIAGNOSIS — M5033 Other cervical disc degeneration, cervicothoracic region: Secondary | ICD-10-CM | POA: Diagnosis not present

## 2016-11-23 MED ORDER — ESTRADIOL 1 MG PO TABS: 1.0000 mg | ORAL_TABLET | Freq: Every day | ORAL | 2 refills | Status: DC

## 2016-11-23 NOTE — Progress Notes (Signed)
Patient states she has notice a lump for about a month. Patient states she also had a lump under the right armpit. Kathrene Alu RNBSN

## 2016-11-23 NOTE — Patient Instructions (Addendum)
Menopause Menopause is the normal time of life when menstrual periods stop completely. Menopause is complete when you have missed 12 consecutive menstrual periods. It usually occurs between the ages of 47 years and 78 years. Very rarely does a woman develop menopause before the age of 47 years. At menopause, your ovaries stop producing the female hormones estrogen and progesterone. This can cause undesirable symptoms and also affect your health. Sometimes the symptoms may occur 4-5 years before the menopause begins. There is no relationship between menopause and:  Oral contraceptives.  Number of children you had.  Race.  The age your menstrual periods started (menarche). Heavy smokers and very thin women may develop menopause earlier in life. What are the causes?  The ovaries stop producing the female hormones estrogen and progesterone. Other causes include:  Surgery to remove both ovaries.  The ovaries stop functioning for no known reason.  Tumors of the pituitary gland in the brain.  Medical disease that affects the ovaries and hormone production.  Radiation treatment to the abdomen or pelvis.  Chemotherapy that affects the ovaries. What are the signs or symptoms?  Hot flashes.  Night sweats.  Decrease in sex drive.  Vaginal dryness and thinning of the vagina causing painful intercourse.  Dryness of the skin and developing wrinkles.  Headaches.  Tiredness.  Irritability.  Memory problems.  Weight gain.  Bladder infections.  Hair growth of the face and chest.  Infertility. More serious symptoms include:  Loss of bone (osteoporosis) causing breaks (fractures).  Depression.  Hardening and narrowing of the arteries (atherosclerosis) causing heart attacks and strokes. How is this diagnosed?  When the menstrual periods have stopped for 12 straight months.  Physical exam.  Hormone studies of the blood. How is this treated? There are many treatment  choices and nearly as many questions about them. The decisions to treat or not to treat menopausal changes is an individual choice made with your health care provider. Your health care provider can discuss the treatments with you. Together, you can decide which treatment will work best for you. Your treatment choices may include:  Hormone therapy (estrogen and progesterone).  Non-hormonal medicines.  Treating the individual symptoms with medicine (for example antidepressants for depression).  Herbal medicines that may help specific symptoms.  Counseling by a psychiatrist or psychologist.  Group therapy.  Lifestyle changes including:  Eating healthy.  Regular exercise.  Limiting caffeine and alcohol.  Stress management and meditation.  No treatment. Follow these instructions at home:  Take the medicine your health care provider gives you as directed.  Get plenty of sleep and rest.  Exercise regularly.  Eat a diet that contains calcium (good for the bones) and soy products (acts like estrogen hormone).  Avoid alcoholic beverages.  Do not smoke.  If you have hot flashes, dress in layers.  Take supplements, calcium, and vitamin D to strengthen bones.  You can use over-the-counter lubricants or moisturizers for vaginal dryness.  Group therapy is sometimes very helpful.  Acupuncture may be helpful in some cases. Contact a health care provider if:  You are not sure you are in menopause.  You are having menopausal symptoms and need advice and treatment.  You are still having menstrual periods after age 47 years.  You have pain with intercourse.  Menopause is complete (no menstrual period for 12 months) and you develop vaginal bleeding.  You need a referral to a specialist (gynecologist, psychiatrist, or psychologist) for treatment. Get help right away if:  You  have severe depression.  You have excessive vaginal bleeding.  You fell and think you have a broken  bone.  You have pain when you urinate.  You develop leg or chest pain.  You have a fast pounding heart beat (palpitations).  You have severe headaches.  You develop vision problems.  You feel a lump in your breast.  You have abdominal pain or severe indigestion. This information is not intended to replace advice given to you by your health care provider. Make sure you discuss any questions you have with your health care provider. Document Released: 09/24/2003 Document Revised: 12/10/2015 Document Reviewed: 01/31/2013 Elsevier Interactive Patient Education  2017 Elsevier Inc. Conjugated Estrogens tablets What is this medicine? CONJUGATED ESTROGENS (CON ju gate ed ESS troe jenz) is an estrogen. It is used as hormone replacement in menopausal women. It helps to treat hot flashes and prevent osteoporosis. It is also used to treat women with low hormone levels or in those who have had their ovaries removed. This medicine may be used for other purposes; ask your health care provider or pharmacist if you have questions. COMMON BRAND NAME(S): Premarin What should I tell my health care provider before I take this medicine? They need to know if you have any of these conditions: -abnormal vaginal bleeding -blood vessel disease or blood clots -breast, cervical, endometrial, ovarian, liver, or uterine cancer -dementia -diabetes -endometriosis -fibroids -gallbladder disease -heart disease or recent heart attack -high blood pressure -high cholesterol -high level of calcium in the blood -kidney disease -liver disease -mental depression -migraine headaches -protein C deficiency -protein S deficiency -stroke -tobacco smoker -an unusual or allergic reaction to estrogens, other medicines, foods, dyes, or preservatives -pregnant or trying to get pregnant -breast-feeding How should I use this medicine? Take this medicine by mouth with a glass of water. Follow the directions on the  prescription label. Take your medicine at regular intervals, at the same time each day. Do not take your medicine more often than directed. A patient package insert for the product will be given with each prescription and refill. Read this sheet carefully each time. Talk to your pediatrician regarding the use of this medicine in children. This medicine is not approved for use in children. Overdosage: If you think you have taken too much of this medicine contact a poison control center or emergency room at once. NOTE: This medicine is only for you. Do not share this medicine with others. What if I miss a dose? If you miss a dose, take it as soon as you can. If it is almost time for your next dose, take only that dose. Do not take double or extra doses. What may interact with this medicine? Do not take this medicine with any of the following medications: -aromatase inhibitors like aminoglutethimide, anastrozole, exemestane, letrozole, testolactone -metyrapone This medicine may also interact with the following medications: -barbiturates, such as phenobarbital -carbamazepine -clarithromycin -erythromycin -grapefruit juice -medicines for fungal infections like ketoconazole and itraconazole -phenytoin - rifampin -ritonavir -St. John's Wort -thyroid hormones This list may not describe all possible interactions. Give your health care provider a list of all the medicines, herbs, non-prescription drugs, or dietary supplements you use. Also tell them if you smoke, drink alcohol, or use illegal drugs. Some items may interact with your medicine. What should I watch for while using this medicine? Visit your health care professional for regular checks on your progress. You will need a regular breast and pelvic exam and Pap smear while on this  medicine. You should also discuss the need for regular mammograms with your health care professional, and follow his or her guidelines for these tests. This medicine  can make your body retain fluid, making your fingers, hands, or ankles swell. Your blood pressure can go up. Contact your doctor or health care professional if you feel you are retaining fluid. If you have any reason to think you are pregnant; stop taking this medicine at once and contact your doctor or health care professional. Smoking increases the risk of getting a blood clot or having a stroke while you are taking this medicine, especially if you are more than 47 years old. You are strongly advised not to smoke. If you wear contact lenses and notice visual changes, or if the lenses begin to feel uncomfortable, consult your eye care specialist. The tablet shell for some brands of this medicine does not dissolve. The tablet shell may appear whole in the stool. This is not a cause for concern. If you see something that resembles a tablet in your stool, talk to your healthcare provider. This medicine can increase the risk of developing a condition (endometrial hyperplasia) that may lead to cancer of the lining of the uterus. Taking progestins, another hormone drug, with this medicine lowers the risk of developing this condition. Therefore, if your uterus has not been removed (by a hysterectomy), your doctor may prescribe a progestin for you to take together with your estrogen. You should know, however, that taking estrogens with progestins may have additional health risks. You should discuss the use of estrogens and progestins with your health care professional to determine the benefits and risks for you. If you are going to have surgery, you may need to stop taking this medicine. Consult your health care professional for advice before you schedule the surgery. What side effects may I notice from receiving this medicine? Side effects that you should report to your doctor or health care professional as soon as possible: -allergic reactions like skin rash, itching or hives, swelling of the face, lips, or  tongue -breast tissue changes or discharge -changes in vision -chest pain -confusion, trouble speaking or understanding -dark urine -general ill feeling or flu-like symptoms -light-colored stools -nausea, vomiting -pain, swelling, warmth in the leg -right upper belly pain -severe headaches -shortness of breath -sudden numbness or weakness of the face, arm or leg -trouble walking, dizziness, loss of balance or coordination -unusual vaginal bleeding -yellowing of the eyes or skin Side effects that usually do not require medical attention (report to your doctor or health care professional if they continue or are bothersome): -hair loss -increased hunger or thirst -increased urination -symptoms of vaginal infection like itching, irritation or unusual discharge -unusually weak or tired This list may not describe all possible side effects. Call your doctor for medical advice about side effects. You may report side effects to FDA at 1-800-FDA-1088. Where should I keep my medicine? Keep out of the reach of children. Store at room temperature between 15 and 30 degrees C (59 and 86 degrees F). Throw away any unused medicine after the expiration date. NOTE: This sheet is a summary. It may not cover all possible information. If you have questions about this medicine, talk to your doctor, pharmacist, or health care provider.  2018 Elsevier/Gold Standard (2010-10-06 09:20:56)

## 2016-11-23 NOTE — Progress Notes (Signed)
History:  46 y.o. G0P0 here today for eval .Pt is known to me from prev care at the [facility] office at [facility] and prev surgery.  Pt is s/p hyst 08/2010. Pt was prev [name]. She is now married to a [employer] and has 2 step daughters.    Pt now having hot flushes. They started 2 years prev.  Pt finds that the flushes are embarrassing and she wants something to stop it.  She reports several instances where she was embarrassed by the amount of sweating. She is followed for multiple medical issues and is followed for thyroid issues.    Pt has had a vaginal lump for several months that she came to have eval but, this week she can't feel the lesion.  The following portions of the patient's history were reviewed and updated as appropriate: allergies, current medications, past family history, past medical history, past social history, past surgical history and problem list.  Review of Systems:  Pertinent items are noted in HPI.   Objective:  Physical Exam Blood pressure 118/80, pulse 88, height 5\' 8"  (1.727 m), weight (!) 318 lb (144.2 kg), last menstrual period 08/13/2012.  CONSTITUTIONAL: Well-developed, well-nourished female in no acute distress. Pt is sweating profusely throughout the visit and fanning due to hot flushes.   HENT:  Normocephalic, atraumatic EYES: Conjunctivae and EOM are normal. No scleral icterus.  NECK: Normal range of motion SKIN: Skin is warm and dry. No rash noted. Not diaphoretic.No pallor. [place]: Alert and oriented to person, place, and time. Normal coordination.  Lungs: CTA CV: RRR  Abd: Soft, nontender and nondistended; obese Pelvic: Normal appearing external genitalia- there are a few small inclusion cycst on the labia on the left side.  These are very small and nontender at present; normal appearing vaginal mucosa and cervix.  Normal discharge.  Small uterus, no other palpable masses, no uterine or adnexal tenderness    Assessment & Plan:  Menopausal  state  Patient with bothersome menopausal vasomotor symptoms. Discussed lifestyle interventions such as wearing light clothing, remaining in cool environments, having fan/air conditioner in the room, avoiding hot beverages etc.  Discussed using hormone therapy and concerns about increased risk of heart disease, cerebrovascular disease, thromboembolic disease,  and breast cancer.  Also discussed other medical options such as Paxil, Effexor or Neurontin.   Also discussed alternative therapies such as herbal remedies but cautioned that most of the products contained phytoestrogens (plant estrogens) in unregulated amounts which can have the same effects on the body as the pharmaceutical estrogen preparations.  Also referred her to www.menopause.org for other alternative options.  Patient opted for Estrace estrogen therapy for now, wants to try the oral formulation.  Estradiol 1mg  prescribed, told to increase to 1 mg if no alleviation of symptoms. She will return in 6 months for reevaluation.  GYN exam- WNL; inclusion cysts noted on vulva on left side    Breast cancer screen   Screening mammogram  Total face-to-face time with patient was 30 min.  Greater than 50% was spent in counseling and coordination of care with the patient.    [name], M.D., [name]

## 2016-11-24 ENCOUNTER — Encounter: Payer: Self-pay | Admitting: Obstetrics & Gynecology

## 2016-11-24 DIAGNOSIS — M9901 Segmental and somatic dysfunction of cervical region: Secondary | ICD-10-CM | POA: Diagnosis not present

## 2016-11-24 DIAGNOSIS — M5033 Other cervical disc degeneration, cervicothoracic region: Secondary | ICD-10-CM | POA: Diagnosis not present

## 2016-12-08 ENCOUNTER — Encounter: Payer: Self-pay | Admitting: Pulmonary Disease

## 2016-12-15 ENCOUNTER — Ambulatory Visit: Payer: Self-pay | Admitting: Family Medicine

## 2016-12-19 ENCOUNTER — Other Ambulatory Visit: Payer: Self-pay | Admitting: Family Medicine

## 2016-12-26 ENCOUNTER — Encounter: Payer: Self-pay | Admitting: Family Medicine

## 2016-12-26 DIAGNOSIS — M9901 Segmental and somatic dysfunction of cervical region: Secondary | ICD-10-CM | POA: Diagnosis not present

## 2016-12-26 DIAGNOSIS — M5033 Other cervical disc degeneration, cervicothoracic region: Secondary | ICD-10-CM | POA: Diagnosis not present

## 2016-12-29 DIAGNOSIS — M5033 Other cervical disc degeneration, cervicothoracic region: Secondary | ICD-10-CM | POA: Diagnosis not present

## 2016-12-29 DIAGNOSIS — M9901 Segmental and somatic dysfunction of cervical region: Secondary | ICD-10-CM | POA: Diagnosis not present

## 2016-12-29 DIAGNOSIS — Z0001 Encounter for general adult medical examination with abnormal findings: Secondary | ICD-10-CM | POA: Diagnosis not present

## 2016-12-29 DIAGNOSIS — D508 Other iron deficiency anemias: Secondary | ICD-10-CM | POA: Diagnosis not present

## 2016-12-29 DIAGNOSIS — Z6841 Body Mass Index (BMI) 40.0 and over, adult: Secondary | ICD-10-CM | POA: Diagnosis not present

## 2016-12-29 DIAGNOSIS — R768 Other specified abnormal immunological findings in serum: Secondary | ICD-10-CM | POA: Diagnosis not present

## 2016-12-29 DIAGNOSIS — Z1389 Encounter for screening for other disorder: Secondary | ICD-10-CM | POA: Diagnosis not present

## 2017-01-02 ENCOUNTER — Ambulatory Visit (HOSPITAL_BASED_OUTPATIENT_CLINIC_OR_DEPARTMENT_OTHER): Payer: Self-pay

## 2017-01-03 ENCOUNTER — Institutional Professional Consult (permissible substitution): Payer: Self-pay | Admitting: Pulmonary Disease

## 2017-01-03 DIAGNOSIS — M9901 Segmental and somatic dysfunction of cervical region: Secondary | ICD-10-CM | POA: Diagnosis not present

## 2017-01-03 DIAGNOSIS — M5033 Other cervical disc degeneration, cervicothoracic region: Secondary | ICD-10-CM | POA: Diagnosis not present

## 2017-01-04 ENCOUNTER — Ambulatory Visit: Payer: Self-pay | Admitting: Obstetrics & Gynecology

## 2017-01-04 DIAGNOSIS — Z09 Encounter for follow-up examination after completed treatment for conditions other than malignant neoplasm: Secondary | ICD-10-CM

## 2017-01-05 ENCOUNTER — Encounter (INDEPENDENT_AMBULATORY_CARE_PROVIDER_SITE_OTHER): Payer: BLUE CROSS/BLUE SHIELD | Admitting: Family Medicine

## 2017-01-14 ENCOUNTER — Other Ambulatory Visit: Payer: Self-pay | Admitting: Family Medicine

## 2017-01-22 ENCOUNTER — Other Ambulatory Visit: Payer: Self-pay | Admitting: Family Medicine

## 2017-02-06 DIAGNOSIS — M9901 Segmental and somatic dysfunction of cervical region: Secondary | ICD-10-CM | POA: Diagnosis not present

## 2017-02-06 DIAGNOSIS — M5033 Other cervical disc degeneration, cervicothoracic region: Secondary | ICD-10-CM | POA: Diagnosis not present

## 2017-02-08 DIAGNOSIS — M9901 Segmental and somatic dysfunction of cervical region: Secondary | ICD-10-CM | POA: Diagnosis not present

## 2017-02-08 DIAGNOSIS — M5033 Other cervical disc degeneration, cervicothoracic region: Secondary | ICD-10-CM | POA: Diagnosis not present

## 2017-02-09 DIAGNOSIS — M9901 Segmental and somatic dysfunction of cervical region: Secondary | ICD-10-CM | POA: Diagnosis not present

## 2017-02-09 DIAGNOSIS — M5033 Other cervical disc degeneration, cervicothoracic region: Secondary | ICD-10-CM | POA: Diagnosis not present

## 2017-02-13 DIAGNOSIS — M5033 Other cervical disc degeneration, cervicothoracic region: Secondary | ICD-10-CM | POA: Diagnosis not present

## 2017-02-13 DIAGNOSIS — M9901 Segmental and somatic dysfunction of cervical region: Secondary | ICD-10-CM | POA: Diagnosis not present

## 2017-02-14 DIAGNOSIS — Z6841 Body Mass Index (BMI) 40.0 and over, adult: Secondary | ICD-10-CM | POA: Diagnosis not present

## 2017-02-14 DIAGNOSIS — F329 Major depressive disorder, single episode, unspecified: Secondary | ICD-10-CM | POA: Diagnosis not present

## 2017-02-14 DIAGNOSIS — Z1389 Encounter for screening for other disorder: Secondary | ICD-10-CM | POA: Diagnosis not present

## 2017-02-21 DIAGNOSIS — M5033 Other cervical disc degeneration, cervicothoracic region: Secondary | ICD-10-CM | POA: Diagnosis not present

## 2017-02-21 DIAGNOSIS — M9901 Segmental and somatic dysfunction of cervical region: Secondary | ICD-10-CM | POA: Diagnosis not present

## 2017-02-22 DIAGNOSIS — M5033 Other cervical disc degeneration, cervicothoracic region: Secondary | ICD-10-CM | POA: Diagnosis not present

## 2017-02-22 DIAGNOSIS — M9901 Segmental and somatic dysfunction of cervical region: Secondary | ICD-10-CM | POA: Diagnosis not present

## 2017-02-23 DIAGNOSIS — M5033 Other cervical disc degeneration, cervicothoracic region: Secondary | ICD-10-CM | POA: Diagnosis not present

## 2017-02-23 DIAGNOSIS — M9901 Segmental and somatic dysfunction of cervical region: Secondary | ICD-10-CM | POA: Diagnosis not present

## 2017-02-27 DIAGNOSIS — M5033 Other cervical disc degeneration, cervicothoracic region: Secondary | ICD-10-CM | POA: Diagnosis not present

## 2017-02-27 DIAGNOSIS — M9901 Segmental and somatic dysfunction of cervical region: Secondary | ICD-10-CM | POA: Diagnosis not present

## 2017-03-06 ENCOUNTER — Institutional Professional Consult (permissible substitution): Payer: Self-pay | Admitting: Internal Medicine

## 2017-03-09 DIAGNOSIS — R197 Diarrhea, unspecified: Secondary | ICD-10-CM | POA: Diagnosis not present

## 2017-03-09 DIAGNOSIS — K210 Gastro-esophageal reflux disease with esophagitis: Secondary | ICD-10-CM | POA: Diagnosis not present

## 2017-03-09 DIAGNOSIS — Z6841 Body Mass Index (BMI) 40.0 and over, adult: Secondary | ICD-10-CM | POA: Diagnosis not present

## 2017-03-13 DIAGNOSIS — M9901 Segmental and somatic dysfunction of cervical region: Secondary | ICD-10-CM | POA: Diagnosis not present

## 2017-03-13 DIAGNOSIS — M5033 Other cervical disc degeneration, cervicothoracic region: Secondary | ICD-10-CM | POA: Diagnosis not present

## 2017-03-14 ENCOUNTER — Other Ambulatory Visit: Payer: Self-pay | Admitting: Family Medicine

## 2017-03-15 ENCOUNTER — Encounter: Payer: Self-pay | Admitting: Family Medicine

## 2017-03-15 ENCOUNTER — Telehealth: Payer: Self-pay

## 2017-03-15 ENCOUNTER — Encounter: Payer: Self-pay | Admitting: Obstetrics & Gynecology

## 2017-03-15 MED ORDER — FLUCONAZOLE 150 MG PO TABS
150.0000 mg | ORAL_TABLET | Freq: Every day | ORAL | 0 refills | Status: DC
Start: 2017-03-15 — End: 2017-03-21

## 2017-03-15 NOTE — Telephone Encounter (Signed)
Patient expierecing yeast infection for last two days. White discharge with itching. Kathrene Alu RNBSN

## 2017-03-21 ENCOUNTER — Encounter (HOSPITAL_BASED_OUTPATIENT_CLINIC_OR_DEPARTMENT_OTHER): Payer: Self-pay

## 2017-03-21 ENCOUNTER — Emergency Department (HOSPITAL_BASED_OUTPATIENT_CLINIC_OR_DEPARTMENT_OTHER)
Admission: EM | Admit: 2017-03-21 | Discharge: 2017-03-21 | Disposition: A | Discharge status: Home / Self Care | Payer: BLUE CROSS/BLUE SHIELD | Attending: Emergency Medicine | Admitting: Emergency Medicine

## 2017-03-21 DIAGNOSIS — K222 Esophageal obstruction: Secondary | ICD-10-CM | POA: Diagnosis not present

## 2017-03-21 DIAGNOSIS — E860 Dehydration: Secondary | ICD-10-CM | POA: Diagnosis not present

## 2017-03-21 DIAGNOSIS — Z79899 Other long term (current) drug therapy: Secondary | ICD-10-CM | POA: Insufficient documentation

## 2017-03-21 DIAGNOSIS — R112 Nausea with vomiting, unspecified: Secondary | ICD-10-CM | POA: Diagnosis not present

## 2017-03-21 DIAGNOSIS — K210 Gastro-esophageal reflux disease with esophagitis: Secondary | ICD-10-CM | POA: Diagnosis not present

## 2017-03-21 LAB — COMPREHENSIVE METABOLIC PANEL
ALT: 38 U/L (ref 14–54)
AST: 29 U/L (ref 15–41)
Albumin: 4.1 g/dL (ref 3.5–5.0)
Alkaline Phosphatase: 94 U/L (ref 38–126)
Anion gap: 11 (ref 5–15)
BUN: 19 mg/dL (ref 6–20)
CHLORIDE: 101 mmol/L (ref 101–111)
CO2: 26 mmol/L (ref 22–32)
CREATININE: 1.08 mg/dL — AB (ref 0.44–1.00)
Calcium: 9.9 mg/dL (ref 8.9–10.3)
GFR calc Af Amer: >60 mL/min (ref >60)
GFR calc non Af Amer: >60 mL/min (ref >60)
GLUCOSE: 97 mg/dL (ref 65–99)
Potassium: 3.9 mmol/L (ref 3.5–5.1)
SODIUM: 138 mmol/L (ref 135–145)
Total Bilirubin: 0.3 mg/dL (ref 0.3–1.2)
Total Protein: 7.8 g/dL (ref 6.5–8.1)

## 2017-03-21 LAB — CBC WITH DIFFERENTIAL/PLATELET
BASOS ABS: 0 10*3/uL (ref 0.0–0.1)
Basophils Relative: 0 %
EOS ABS: 0.2 10*3/uL (ref 0.0–0.7)
EOS PCT: 2 %
HCT: 43.6 % (ref 36.0–46.0)
Hemoglobin: 14.4 g/dL (ref 12.0–15.0)
LYMPHS PCT: 18 %
Lymphs Abs: 1.8 10*3/uL (ref 0.7–4.0)
MCH: 30.2 pg (ref 26.0–34.0)
MCHC: 33 g/dL (ref 30.0–36.0)
MCV: 91.4 fL (ref 78.0–100.0)
Monocytes Absolute: 0.8 10*3/uL (ref 0.1–1.0)
Monocytes Relative: 8 %
NEUTROS PCT: 72 %
Neutro Abs: 7 10*3/uL (ref 1.7–7.7)
PLATELETS: 280 10*3/uL (ref 150–400)
RBC: 4.77 MIL/uL (ref 3.87–5.11)
RDW: 13.6 % (ref 11.5–15.5)
WBC: 9.9 10*3/uL (ref 4.0–10.5)

## 2017-03-21 LAB — URINALYSIS, ROUTINE W REFLEX MICROSCOPIC
Bilirubin Urine: NEGATIVE
GLUCOSE, UA: NEGATIVE mg/dL
HGB URINE DIPSTICK: NEGATIVE
Ketones, ur: NEGATIVE mg/dL
Leukocytes, UA: NEGATIVE
Nitrite: NEGATIVE
PH: 6 (ref 5.0–8.0)
Protein, ur: NEGATIVE mg/dL

## 2017-03-21 LAB — LIPASE, BLOOD: Lipase: 26 U/L (ref 11–51)

## 2017-03-21 MED ORDER — MORPHINE SULFATE (PF) 4 MG/ML IV SOLN
4.0000 mg | Freq: Once | INTRAVENOUS | Status: AC
  Administered 2017-03-21: 4 mg via INTRAVENOUS
  Filled 2017-03-21: qty 1

## 2017-03-21 MED ORDER — ONDANSETRON HCL 4 MG/2ML IJ SOLN
4.0000 mg | Freq: Once | INTRAMUSCULAR | Status: AC
  Administered 2017-03-21: 4 mg via INTRAVENOUS
  Filled 2017-03-21: qty 2

## 2017-03-21 MED ORDER — SODIUM CHLORIDE 0.9 % IV BOLUS (SEPSIS)
1000.0000 mL | Freq: Once | INTRAVENOUS | Status: AC
  Administered 2017-03-21: 1000 mL via INTRAVENOUS

## 2017-03-21 MED ORDER — PROMETHAZINE HCL 25 MG/ML IJ SOLN
12.5000 mg | Freq: Once | INTRAMUSCULAR | Status: AC
  Administered 2017-03-21: 12.5 mg via INTRAVENOUS
  Filled 2017-03-21: qty 1

## 2017-03-21 MED ORDER — ONDANSETRON 4 MG PO TBDP: 4.0000 mg | ORAL_TABLET | Freq: Three times a day (TID) | ORAL | 0 refills | Status: DC | PRN

## 2017-03-21 MED FILL — ONDANSETRON ODT 4 MG TABLET: 4 | 4 days supply | Qty: 10 | Fill #0

## 2017-03-21 NOTE — ED Provider Notes (Signed)
[facility]PT [facility] Provider Note   CSN: [id number] Arrival date & time: 03/21/17  1112     History   Chief Complaint Chief Complaint  Patient presents with  . Emesis    HPI [name] is a 46 y.o. female.  Pt presents to the ED today with vomiting.  Pt said she's had vomiting for the past 12 days.  She is getting evaluated by Dr. Toney Rakes at [facility] for bariatric surgery.  She had a nuclear medicine gastric emptying study today as part of that evaluation.  She vomited almost the whole meal 1 hour into the study, so she was advised to go to the ED.  Pt denies any pain.       Past Medical History:  Diagnosis Date  . Abdominal pain 03/18/2016  . Allergy   . Anemia    in the past  . Anxiety   . Anxiety disorder   . Bipolar depression ([place]) 05/29/2016  . Breast lesion 12/06/2015  . Chicken pox   . Complication of anesthesia 1999   urinary retention  . Depression    in teh past- ok now  . Diarrhea 03/18/2016  . Fibromyalgia   . GERD (gastroesophageal reflux disease)   . Hyperglycemia 03/18/2016  . Hyperlipidemia 08/07/2015  . Left thyroid nodule 03/31/2013  . Melanoma ([place])    right hip and knee  . Migraine   . Neck pain on right side 12/23/2012  . Obesity   . Pain 03/31/2013  . Palpitations 08/10/2014  . Shortness of breath    when anemic  . Sinusitis 08/07/2015  . Skin lesion of breast 12/06/2015  . TIA (transient ischemic attack) 03/31/2013  . UTI (lower urinary tract infection)     Patient Active Problem List   Diagnosis Date Noted  . Vasomotor symptoms due to menopause 11/23/2016  . History of melanoma 10/28/2016  . DJD (degenerative joint disease), cervical 10/25/2016  . 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 Primary osteoarthritis of both hands 10/25/2016  . Primary osteoarthritis of left knee 10/25/2016  . Fibromyalgia 10/25/2016  . Allergic rhinitis with a probable non-allergic component 10/24/2016  . ANA positive 10/21/2016  . Elevated sed rate  05/29/2016  . Bipolar depression ([place]) 05/29/2016  . Fever 05/23/2016  . Other complicated headache syndrome 05/23/2016  . Rash and nonspecific skin eruption 05/23/2016  . Hyperglycemia 03/18/2016  . Diarrhea 03/18/2016  . Abdominal pain 03/18/2016  . Perimenopausal 01/31/2016  . Vitamin D deficiency 01/31/2016  . Myalgia 01/31/2016  . Skin lesion of breast 12/06/2015  . Dysphagia 08/12/2015  . Peptic stricture of esophagus 08/12/2015  . Hyperlipidemia 08/07/2015  . Left foot pain 08/07/2015  . Recurrent sinusitis 08/07/2015  . Back pain 08/19/2014  . Palpitations 08/10/2014  . Pain 03/31/2013  . Neck pain on right side 12/23/2012  . Fibroids 07/04/2012  . Menorrhagia 06/25/2012  . Hypertriglyceridemia 04/23/2012  . Annual physical exam 12/15/2011  . Obesity 12/10/2010  . Lower extremity edema 12/10/2010  . Iron deficiency anemia 08/18/2009  . GERD 08/18/2009    Past Surgical History:  Procedure Laterality Date  . ABDOMINAL HYSTERECTOMY    . BACK SURGERY    . BILATERAL SALPINGECTOMY Bilateral 09/13/2012   Procedure: BILATERAL SALPINGECTOMY;  Surgeon: Lavonia Drafts, MD;  Location: [facility];  Service: Gynecology;  Laterality: Bilateral;  . CHOLECYSTECTOMY    . COLONOSCOPY    . LAPAROSCOPIC ASSISTED VAGINAL HYSTERECTOMY N/A 09/13/2012   Procedure: LAPAROSCOPIC ASSISTED VAGINAL HYSTERECTOMY;  Surgeon: Lavonia Drafts, MD;  Location: [facility];  Service: Gynecology;  Laterality: N/A;  . MELANOMA EXCISION  2011   stage I and II  . WISDOM TOOTH EXTRACTION  1989    OB History    Gravida Para Term Preterm AB Living   0             SAB TAB Ectopic Multiple Live Births                   Home Medications    Prior to Admission medications   Medication Sig Start Date End Date Taking? Authorizing Provider  buPROPion (WELLBUTRIN XL) 300 MG 24 hr tablet Take 300 mg by mouth daily.   Yes [provider]  cetirizine (ZYRTEC) 10 MG tablet Take 1 tablet (10 mg  total) by mouth daily. 10/27/16   Mosie Lukes, MD  cholecalciferol (VITAMIN D) 1000 units tablet Take 2,000 Units by mouth daily.    [provider]  ondansetron (ZOFRAN ODT) 4 MG disintegrating tablet Take 1 tablet (4 mg total) by mouth every 8 (eight) hours as needed. 03/21/17   Isla Pence, MD  Vitamin D, Ergocalciferol, (DRISDOL) 50000 units CAPS capsule TAKE 1 CAPSULE BY MOUTH ONCE WEEKLY 10/17/16   Mosie Lukes, MD    Family History Family History  Problem Relation Age of Onset  . Colitis Father        crohn's  . Heart disease Father        grandfather  . Heart attack Father 1  . Arthritis Father        s/p back surgery  . Other Mother        neurological autoimmune disease, chorea in trunk  . Eczema Mother   . Asthma Mother   . Arthritis Maternal Grandmother   . Lupus Maternal Grandmother   . Dementia Maternal Grandmother   . CVA Maternal Grandmother   . Arthritis Paternal Grandmother   . Heart disease Paternal Grandmother        chf  . Asthma Paternal Grandmother   . Diabetes Paternal Grandfather   . Kidney disease Paternal Grandfather   . Hypertension Paternal Grandfather   . Hyperlipidemia Paternal Grandfather   . Cancer Maternal Grandfather        bone  . Diabetes Unknown        grandfather  . Irritable bowel syndrome Unknown   . Kidney disease Unknown        grandfather  . Arthritis Other   . Cancer Other        breast  . Hyperlipidemia Other   . Mental illness Other   . Emphysema Maternal Uncle   . Colon cancer Neg Hx   . Allergic rhinitis Neg Hx   . Angioedema Neg Hx   . Immunodeficiency Neg Hx   . Urticaria Neg Hx     Social History Social History  Substance Use Topics  . Smoking status: Never Smoker  . Smokeless tobacco: Never Used     Comment: never used tobacco  . Alcohol use No     Allergies   Seroquel [quetiapine fumarate]; Adhesive [tape]; Aleve [naproxen sodium]; Carbamazepine; and Mold extract  [trichophyton]   Review of Systems Review of Systems  Gastrointestinal: Positive for vomiting.  All other systems reviewed and are negative.    Physical Exam Updated Vital Signs BP 105/60   Pulse 63   Temp 98.4 F (36.9 C) (Oral)   Resp 12   Ht 5\' 9"  (1.753 m)   Wt [place] Kitchen)  143.8 kg (317 lb)   LMP 08/13/2012   SpO2 98%   BMI 46.81 kg/m   Physical Exam  Constitutional: She is oriented to person, place, and time. She appears well-developed and well-nourished.  HENT:  Head: Normocephalic and atraumatic.  Right Ear: External ear normal.  Left Ear: External ear normal.  Nose: Nose normal.  Mouth/Throat: Mucous membranes are dry.  Eyes: Pupils are equal, round, and reactive to light. Conjunctivae and EOM are normal.  Neck: Normal range of motion. Neck supple.  Cardiovascular: Normal rate, regular rhythm, normal heart sounds and intact distal pulses.   Pulmonary/Chest: Effort normal and breath sounds normal.  Abdominal: Soft. Bowel sounds are normal.  Musculoskeletal: Normal range of motion.  Neurological: She is alert and oriented to person, place, and time.  Skin: Skin is warm. Capillary refill takes less than 2 seconds.  Psychiatric: She has a normal mood and affect. Her behavior is normal. Judgment and thought content normal.  Nursing note and vitals reviewed.    ED Treatments / Results  Labs (all labs ordered are listed, but only abnormal results are displayed) Labs Reviewed  COMPREHENSIVE METABOLIC PANEL - Abnormal; Notable for the following:       Result Value   Creatinine, Ser 1.08 (*)    All other components within normal limits  URINALYSIS, ROUTINE W REFLEX MICROSCOPIC - Abnormal; Notable for the following:    APPearance HAZY (*)    Specific Gravity, Urine >1.030 (*)    All other components within normal limits  CBC WITH DIFFERENTIAL/PLATELET  LIPASE, BLOOD    EKG  EKG Interpretation None       Radiology No results found.  Procedures Procedures  (including critical care time)  Medications Ordered in ED Medications  morphine 4 MG/ML injection 4 mg (4 mg Intravenous Given 03/21/17 1159)  ondansetron (ZOFRAN) injection 4 mg (4 mg Intravenous Given 03/21/17 1159)  sodium chloride 0.9 % bolus 1,000 mL (0 mLs Intravenous Stopped 03/21/17 1359)  sodium chloride 0.9 % bolus 1,000 mL (0 mLs Intravenous Stopped 03/21/17 1504)  promethazine (PHENERGAN) injection 12.5 mg (12.5 mg Intravenous Given 03/21/17 1401)     Initial Impression / Assessment and Plan / ED Course  I have reviewed the triage vital signs and the nursing notes.  Pertinent labs & imaging results that were available during my care of the patient were reviewed by me and considered in my medical decision making (see chart for details).     Pt said she's been off her ppi meds due to all the tests Dr. Toney Rakes ordered.  She has an upper gi in 2 days, so we will continue to hold ppis.  The pt is feeling better after zofran.  She knows to return if worse.  Final Clinical Impressions(s) / ED Diagnoses   Final diagnoses:  Dehydration  Non-intractable vomiting with nausea, unspecified vomiting type    New Prescriptions New Prescriptions   ONDANSETRON (ZOFRAN ODT) 4 MG DISINTEGRATING TABLET    Take 1 tablet (4 mg total) by mouth every 8 (eight) hours as needed.     Isla Pence, MD 03/21/17 [phone]

## 2017-03-21 NOTE — ED Triage Notes (Signed)
C/o n/v x 12 days-has not sought medical attention-presents to triage vomiting

## 2017-04-04 ENCOUNTER — Telehealth: Payer: Self-pay | Admitting: Internal Medicine

## 2017-04-04 NOTE — Telephone Encounter (Signed)
Pt states she has been vomiting almost after every meal since 03/10/17. She has been seen for bariatric surgery and had some tests ordered but has not been able to complete them due to the vomiting. Pt scheduled to see Alonza Bogus PA 04/10/17@2 :30pm. Pt aware of appt.

## 2017-04-04 NOTE — Telephone Encounter (Signed)
Left message for pt to call back  °

## 2017-04-09 ENCOUNTER — Other Ambulatory Visit: Payer: Self-pay | Admitting: Family Medicine

## 2017-04-10 ENCOUNTER — Encounter: Payer: Self-pay | Admitting: Gastroenterology

## 2017-04-10 ENCOUNTER — Ambulatory Visit (INDEPENDENT_AMBULATORY_CARE_PROVIDER_SITE_OTHER): Payer: BLUE CROSS/BLUE SHIELD | Admitting: Gastroenterology

## 2017-04-10 ENCOUNTER — Other Ambulatory Visit: Payer: Self-pay | Admitting: Family Medicine

## 2017-04-10 ENCOUNTER — Other Ambulatory Visit (INDEPENDENT_AMBULATORY_CARE_PROVIDER_SITE_OTHER): Payer: BLUE CROSS/BLUE SHIELD

## 2017-04-10 VITALS — BP 108/66 | HR 68 | Ht 69.0 in | Wt 323.1 lb

## 2017-04-10 DIAGNOSIS — K219 Gastro-esophageal reflux disease without esophagitis: Secondary | ICD-10-CM | POA: Diagnosis not present

## 2017-04-10 DIAGNOSIS — R112 Nausea with vomiting, unspecified: Secondary | ICD-10-CM

## 2017-04-10 DIAGNOSIS — K2100 Gastro-esophageal reflux disease with esophagitis, without bleeding: Secondary | ICD-10-CM

## 2017-04-10 DIAGNOSIS — K9089 Other intestinal malabsorption: Secondary | ICD-10-CM

## 2017-04-10 LAB — IGA: IGA: 228 mg/dL (ref 68–378)

## 2017-04-10 MED ORDER — CHOLESTYRAMINE 4 G PO PACK: PACK | ORAL | 2 refills | Status: DC

## 2017-04-10 MED ORDER — DEXLANSOPRAZOLE 60 MG PO CPDR: 60.0000 mg | DELAYED_RELEASE_CAPSULE | Freq: Every day | ORAL | 2 refills | Status: DC

## 2017-04-10 NOTE — Patient Instructions (Addendum)
Please go to the basement level to have your labs drawn.   We have sent the following medications to your pharmacy for you to pick up at your convenience: [facility].   Discontinue Protonix 40 mg ( Pantoprazole sodium 40 mg ).   Take Zofran around the clock every 6 hours.   You have been scheduled for an esophageal manometry and 24 PH Probe at [facility] on Wed 9-26 at 12:30 PM Please arrive 30 minutes prior to your procedure for registration. You will need to go to outpatient registration (1st floor of the hospital) first. Make certain to bring your insurance cards as well as a complete list of medications.  Please remember the following: Continue the Dexilant 60 mg for the test.   1) Do not take any muscle relaxants, xanax (alprazolam) or ativan for 1 day prior to your     test as well as the day of the test.  2) Nothing to eat or drink after 12:00 midnight on the night before your test.  3) Hold all diabetic medications/insulin the morning of the test. You may eat and take             your medications after the test.  It will take at least 2 weeks to receive the results of this test from your physician. ------------------------------------------ ABOUT ESOPHAGEAL MANOMETRY Esophageal manometry (muh-NOM-uh-tree) is a test that gauges how well your esophagus works. Your esophagus is the long, muscular tube that connects your throat to your stomach. Esophageal manometry measures the rhythmic muscle contractions (peristalsis) that occur in your esophagus when you swallow. Esophageal manometry also measures the coordination and force exerted by the muscles of your esophagus.  During esophageal manometry, a thin, flexible tube (catheter) that contains sensors is passed through your nose, down your esophagus and into your stomach. Esophageal manometry can be helpful in diagnosing some mostly uncommon disorders that affect your esophagus.  Why it's done Esophageal  manometry is used to evaluate the movement (motility) of food through the esophagus and into the stomach. The test measures how well the circular bands of muscle (sphincters) at the top and bottom of your esophagus open and close, as well as the pressure, strength and pattern of the wave of esophageal muscle contractions that moves food along.  What you can expect Esophageal manometry is an outpatient procedure done without sedation. Most people tolerate it well. You may be asked to change into a hospital gown before the test starts.  During esophageal manometry  While you are sitting up, a member of your health care team sprays your throat with a numbing medication or puts numbing gel in your nose or both.  A catheter is guided through your nose into your esophagus. The catheter may be sheathed in a water-filled sleeve. It doesn't interfere with your breathing. However, your eyes may water, and you may gag. You may have a slight nosebleed from irritation.  After the catheter is in place, you may be asked to lie on your back on an exam table, or you may be asked to remain seated.  You then swallow small sips of water. As you do, a computer connected to the catheter records the pressure, strength and pattern of your esophageal muscle contractions.  During the test, you'll be asked to breathe slowly and smoothly, remain as still as possible, and swallow only when you're asked to do so.  A member of your health care team may move the  catheter down into your stomach while the catheter continues its measurements.  The catheter then is slowly withdrawn. The test usually lasts 20 to 30 minutes.  After esophageal manometry  When your esophageal manometry is complete, you may return to your normal activities  This test typically takes 30-45 minutes to complete. ________________________________________________________________________________

## 2017-04-10 NOTE — Telephone Encounter (Signed)
Not on current med list/thx dmf

## 2017-04-12 ENCOUNTER — Encounter (HOSPITAL_COMMUNITY)
Admission: RE | Disposition: A | Discharge status: Home / Self Care | Payer: Self-pay | Source: Ambulatory Visit | Attending: Gastroenterology

## 2017-04-12 ENCOUNTER — Ambulatory Visit (HOSPITAL_COMMUNITY)
Admission: RE | Admit: 2017-04-12 | Discharge: 2017-04-12 | Disposition: A | Discharge status: Home / Self Care | Payer: BLUE CROSS/BLUE SHIELD | Source: Ambulatory Visit | Attending: Gastroenterology | Admitting: Gastroenterology

## 2017-04-12 DIAGNOSIS — R1110 Vomiting, unspecified: Secondary | ICD-10-CM | POA: Insufficient documentation

## 2017-04-12 DIAGNOSIS — K219 Gastro-esophageal reflux disease without esophagitis: Secondary | ICD-10-CM | POA: Diagnosis not present

## 2017-04-12 HISTORY — PX: ESOPHAGEAL MANOMETRY: SHX5429

## 2017-04-12 HISTORY — PX: 24 HOUR PH STUDY: SHX5419

## 2017-04-12 SURGERY — MANOMETRY, ESOPHAGUS

## 2017-04-12 MED ORDER — LIDOCAINE VISCOUS 2 % MT SOLN
OROMUCOSAL | Status: AC
  Filled 2017-04-12: qty 15

## 2017-04-12 SURGICAL SUPPLY — 2 items
FACESHIELD LNG OPTICON STERILE (SAFETY) IMPLANT
GLOVE BIO SURGEON STRL SZ8 (GLOVE) ×4 IMPLANT

## 2017-04-12 NOTE — Progress Notes (Signed)
Esophageal Manometry and Ph study done per protocol.  Pt tolerated both procedures well, w/o complications.  Dr. Silverio Decamp notified of study.  Pt. Stated she was to continue her acid reflux medication while on 24 hr Ph study.    Laverta Baltimore, RN

## 2017-04-13 ENCOUNTER — Encounter (HOSPITAL_COMMUNITY): Payer: Self-pay | Admitting: Gastroenterology

## 2017-04-13 DIAGNOSIS — K9089 Other intestinal malabsorption: Secondary | ICD-10-CM | POA: Insufficient documentation

## 2017-04-13 LAB — TISSUE TRANSGLUTAMINASE, IGG: (tTG) Ab, IgG: 1 U/mL

## 2017-04-13 NOTE — Progress Notes (Signed)
04/13/2017 RITA VIALPANDO [id number] 1971/04/26   HISTORY OF PRESENT ILLNESS:  This is a 46 year old female known to Dr. Henrene Pastor for treatment of GERD. Last seen my me in 07/2015 at which time she was complaining of severe vomiting.  She is here today with the same issues.  She had an EGD at that time that showed GERD with erosive esophagitis.  EGD in 2011 that showed the same along with a distal esophageal stricture and a hiatal hernia--duodenal biopsies showed benign small bowel mucosa. She also had a colonoscopy at that time that was normal.  She presents to our office today with complaints primarily of recurrent vomiting. She describes this as very violent vomiting that previously was occurring mostly at nighttime, but now occurs all the time throughout the day.  This has been much more severe since 03/09/2017.  She reports very severe reflux despite her pantoprazole 40 mg twice daily. She denies any significant abdominal pain.  She has gained 30 pounds since her last visit here in January 2017. She tells me that she is trying to be evaluated for bariatric surgery and saw a physician by the name of Dr. Toney Rakes (appears to be a Copywriter, advertising) through [facility] who was planning to work her up for this. She apparently attempted to undergo a gastric emptying scan but vomited the entire meal after an hour into the study. It appears that he also was planning to do an esophageal manometry.  She is status post cholecystectomy.   She has diagnoses of fibromyalgia and anxiety and is very dramatic at her appointment today about her symptoms and is crying/tearful.  Had trash can in front of her and told me that she vomited while she was in the room waiting to be seen.  Recent CBC, CMP, and lipase WNL's.    Past Medical History:  Diagnosis Date  . Abdominal pain 03/18/2016  . Allergy   . Anemia    in the past  . Anxiety   . Anxiety disorder   . Bipolar depression ([place]) 05/29/2016  . Breast  lesion 12/06/2015  . Chicken pox   . Complication of anesthesia 1999   urinary retention  . Depression    in teh past- ok now  . Diarrhea 03/18/2016  . Fibromyalgia   . GERD (gastroesophageal reflux disease)   . Hyperglycemia 03/18/2016  . Hyperlipidemia 08/07/2015  . Left thyroid nodule 03/31/2013  . Melanoma ([place])    right hip and knee  . Migraine   . Neck pain on right side 12/23/2012  . Obesity   . Pain 03/31/2013  . Palpitations 08/10/2014  . Shortness of breath    when anemic  . Sinusitis 08/07/2015  . Skin lesion of breast 12/06/2015  . TIA (transient ischemic attack) 03/31/2013  . UTI (lower urinary tract infection)    Past Surgical History:  Procedure Laterality Date  . [place] STUDY N/A 04/12/2017   Procedure: [place];  Surgeon: Mauri Pole, MD;  [facility];  Service: Endoscopy;  Laterality: N/A;  . ABDOMINAL HYSTERECTOMY    . BACK SURGERY    . BILATERAL SALPINGECTOMY Bilateral 09/13/2012   Procedure: BILATERAL SALPINGECTOMY;  Surgeon: Lavonia Drafts, MD;  Location: [facility];  Service: Gynecology;  Laterality: Bilateral;  . CHOLECYSTECTOMY    . COLONOSCOPY    . ESOPHAGEAL MANOMETRY N/A 04/12/2017   Procedure: ESOPHAGEAL MANOMETRY (EM);  Surgeon: Mauri Pole, MD;  [facility];  Service: Endoscopy;  Laterality: N/A;  . LAPAROSCOPIC ASSISTED VAGINAL HYSTERECTOMY N/A 09/13/2012   Procedure: LAPAROSCOPIC ASSISTED VAGINAL HYSTERECTOMY;  Surgeon: Lavonia Drafts, MD;  Location: [facility];  Service: Gynecology;  Laterality: N/A;  . MELANOMA EXCISION  2011   stage I and [place]    reports that she has never smoked. She has never used smokeless tobacco. She reports that she does not drink alcohol or use drugs. family history includes Arthritis in her father, maternal grandmother, other, and paternal grandmother; Asthma in her mother and paternal grandmother; CVA in her maternal  grandmother; Cancer in her maternal grandfather and other; Colitis in her father; Dementia in her maternal grandmother; Diabetes in her paternal grandfather and unknown relative; Eczema in her mother; Emphysema in her maternal uncle; Heart attack (age of onset: 59) in her father; Heart disease in her father and paternal grandmother; Hyperlipidemia in her other and paternal grandfather; Hypertension in her paternal grandfather; Irritable bowel syndrome in her unknown relative; Kidney disease in her paternal grandfather and unknown relative; Lupus in her maternal grandmother; Mental illness in her other; Other in her mother. Allergies  Allergen Reactions  . Seroquel [Quetiapine Fumarate] Nausea And Vomiting    Nightmares and vomiting   . Adhesive [Tape] Dermatitis    Band- Aid adhesive  . Aleve [Naproxen Sodium]     Makes hands, legs, and feet swell  . Carbamazepine     Hallucinations and dizziness  . Mold Extract [Trichophyton]       Outpatient Encounter Prescriptions as of 04/10/2017  Medication Sig  . buPROPion (WELLBUTRIN XL) 300 MG 24 hr tablet Take 300 mg by mouth daily.  . cetirizine (ZYRTEC) 10 MG tablet Take 1 tablet (10 mg total) by mouth daily.  . cholecalciferol (VITAMIN D) 1000 units tablet Take 2,000 Units by mouth daily.  Marland Kitchen loperamide (IMODIUM) 2 MG capsule Take by mouth as needed for diarrhea or loose stools.  . ondansetron (ZOFRAN ODT) 4 MG disintegrating tablet Take 1 tablet (4 mg total) by mouth every 8 (eight) hours as needed.  . pantoprazole (PROTONIX) 40 MG tablet Take 40 mg by mouth 2 (two) times daily.  . Vitamin D, Ergocalciferol, (DRISDOL) 50000 units CAPS capsule TAKE 1 CAPSULE BY MOUTH EVERY 7 DAYS- THEN TAKE OTC VITAMIN DAILY 2000 IU DAILY  . cholestyramine (QUESTRAN) 4 g packet Take 1 packet in juice or water daily.  Marland Kitchen dexlansoprazole (DEXILANT) 60 MG capsule Take 1 capsule (60 mg total) by mouth daily.   No facility-administered encounter medications on file as  of 04/10/2017.      REVIEW OF SYSTEMS  : All other systems reviewed and negative except where noted in the History of Present Illness.   PHYSICAL EXAM: BP 108/66   Pulse 68   Ht 5\' 9"  (1.753 m)   Wt (!) 323 lb 1.6 oz (146.6 kg)   LMP 08/13/2012   BMI 47.71 kg/m  General: Well developed white female in no acute distress Head: Normocephalic and atraumatic Eyes:  Sclerae anicteric, conjunctiv pink. Ears: Normal auditory acuity Lungs: Clear throughout to auscultation; no increased WOB Heart: Regular rate and rhythm; no M/R/G. Abdomen: Soft, non-distended.  BS present.  Non-tender. Musculoskeletal: Symmetrical with no gross deformities  Skin: No lesions on visible extremities Extremities: No edema  Neurological: Alert oriented x 4, grossly non-focal Psychological:  Alert and cooperative. Normal mood and affect  ASSESSMENT AND PLAN: *Persistent violent vomiting:  ? Source.  ? If this is  due to severe GERD.  Will discontinue pantoprazole and start Dexilant 60 mg daily.  Will check 24 pH study with impedence and esophageal manometry while on her PPI therapy in order to see if she is still in fact having severe reflux despite her medication.  Will give her Zofran to use and I would like her to take it around the clock every 6 hours for now.  Discussed that weight loss would help her reflux and she is trying to be evaluated to undergo bariatric surgery. *Diarrhea:  Likely bile-salt related; began/much worse since cholecystectomy in 2012.  Will try [place] daily.  Previous duodenal biopsies negative but will check celiac labs.   CC:  Sharilyn Sites, MD

## 2017-04-14 ENCOUNTER — Telehealth: Payer: Self-pay | Admitting: Gastroenterology

## 2017-04-14 NOTE — Telephone Encounter (Signed)
The pt questions answered.  She will call with any further concerns

## 2017-04-17 NOTE — Progress Notes (Signed)
Initial assessment and plans reviewed 

## 2017-04-18 DIAGNOSIS — R1110 Vomiting, unspecified: Secondary | ICD-10-CM

## 2017-04-19 ENCOUNTER — Telehealth: Payer: Self-pay

## 2017-04-19 NOTE — Telephone Encounter (Signed)
-----   Message from Loralie Champagne, PA-C sent at 04/19/2017  2:32 PM EDT ----- Chong Sicilian,  We please let the patient know that obviously her study was not complete with the 24 hour pH probe as she removed the probe after less than 8 hours. During the time the probe was in place she did not have any reflux events during that study period. We are recommending that she repeat the 24-hour pH study while off of her PPI medication to see if she is having any reflux at all. It appears that her medication is working, but now we actually want to prove how much reflux she is actually having without medication. This 24-hour pH study would be performed WITHOUT the impedance part so the tube is narrower and a little bit less uncomfortable.  Please schedule this for her if she is agreeable.  Thank you,  Jess

## 2017-04-20 NOTE — Telephone Encounter (Signed)
The pt states she has felt better over the past few days and would like to wait until next week to decide if she wants to have another test.  She will call next week.

## 2017-04-21 NOTE — Progress Notes (Deleted)
Office Visit Note  Patient: [name]             Date of Birth: 02/12/1971           MRN: [id number]             PCP: Sharilyn Sites, MD Referring: Mosie Lukes, MD Visit Date: 05/02/2017 Occupation: @GUAROCC @    Subjective:  No chief complaint on file.   History of Present Illness: [name] is a 46 y.o. female ***   Activities of Daily Living:  Patient reports morning stiffness for *** {minute/hour:19697}.   Patient {ACTIONS;DENIES/REPORTS:21021675::"Denies"} nocturnal pain.  Difficulty dressing/grooming: {ACTIONS;DENIES/REPORTS:21021675::"Denies"} Difficulty climbing stairs: {ACTIONS;DENIES/REPORTS:21021675::"Denies"} Difficulty getting out of chair: {ACTIONS;DENIES/REPORTS:21021675::"Denies"} Difficulty using hands for taps, buttons, cutlery, and/or writing: {ACTIONS;DENIES/REPORTS:21021675::"Denies"}   No Rheumatology ROS completed.   PMFS History:  Patient Active Problem List   Diagnosis Date Noted  . Regurgitation of food   . 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 Vasomotor symptoms due to menopause 11/23/2016  . History of melanoma 10/28/2016  . DJD (degenerative joint disease), cervical 10/25/2016  . 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 Primary osteoarthritis of both hands 10/25/2016  . Primary osteoarthritis of left knee 10/25/2016  . Fibromyalgia 10/25/2016  . Allergic rhinitis with a probable non-allergic component 10/24/2016  . ANA positive 10/21/2016  . Elevated sed rate 05/29/2016  . Bipolar depression ([place]) 05/29/2016  . Fever 05/23/2016  . Other complicated headache syndrome 05/23/2016  . Rash and nonspecific skin eruption 05/23/2016  . Hyperglycemia 03/18/2016  . Diarrhea 03/18/2016  . Abdominal pain 03/18/2016  . Perimenopausal 01/31/2016  . Vitamin D deficiency 01/31/2016  . Myalgia 01/31/2016  . Skin lesion of breast 12/06/2015  . Non-intractable vomiting with nausea 08/12/2015  . Dysphagia 08/12/2015  . Peptic stricture  of esophagus 08/12/2015  . Hyperlipidemia 08/07/2015  . Left foot pain 08/07/2015  . Recurrent sinusitis 08/07/2015  . Back pain 08/19/2014  . Palpitations 08/10/2014  . Pain 03/31/2013  . Neck pain on right side 12/23/2012  . Fibroids 07/04/2012  . Menorrhagia 06/25/2012  . Hypertriglyceridemia 04/23/2012  . Annual physical exam 12/15/2011  . Obesity 12/10/2010  . Lower extremity edema 12/10/2010  . Iron deficiency anemia 08/18/2009  . GERD 08/18/2009    Past Medical History:  Diagnosis Date  . Abdominal pain 03/18/2016  . Allergy   . Anemia    in the past  . Anxiety   . Anxiety disorder   . Bipolar depression ([place]) 05/29/2016  . Breast lesion 12/06/2015  . Chicken pox   . Complication of anesthesia 1999   urinary retention  . Depression    in teh past- ok now  . Diarrhea 03/18/2016  . Fibromyalgia   . GERD (gastroesophageal reflux disease)   . Hyperglycemia 03/18/2016  . Hyperlipidemia 08/07/2015  . Left thyroid nodule 03/31/2013  . Melanoma ([place])    right hip and knee  . Migraine   . Neck pain on right side 12/23/2012  . Obesity   . Pain 03/31/2013  . Palpitations 08/10/2014  . Shortness of breath    when anemic  . Sinusitis 08/07/2015  . Skin lesion of breast 12/06/2015  . TIA (transient ischemic attack) 03/31/2013  . UTI (lower urinary tract infection)     Family History  Problem Relation Age of Onset  . Colitis Father        crohn's  . Heart disease Father        grandfather  . Heart attack Father 73  .  Arthritis Father        s/p back surgery  . Other Mother        neurological autoimmune disease, chorea in trunk  . Eczema Mother   . Asthma Mother   . Arthritis Maternal Grandmother   . Lupus Maternal Grandmother   . Dementia Maternal Grandmother   . CVA Maternal Grandmother   . Arthritis Paternal Grandmother   . Heart disease Paternal Grandmother        chf  . Asthma Paternal Grandmother   . Diabetes Paternal Grandfather   . Kidney disease Paternal  Grandfather   . Hypertension Paternal Grandfather   . Hyperlipidemia Paternal Grandfather   . Cancer Maternal Grandfather        bone  . Diabetes Unknown        grandfather  . Irritable bowel syndrome Unknown   . Kidney disease Unknown        grandfather  . Arthritis Other   . Cancer Other        breast  . Hyperlipidemia Other   . Mental illness Other   . Emphysema Maternal Uncle   . Colon cancer Neg Hx   . Allergic rhinitis Neg Hx   . Angioedema Neg Hx   . Immunodeficiency Neg Hx   . Urticaria Neg Hx    Past Surgical History:  Procedure Laterality Date  . [place] STUDY N/A 04/12/2017   Procedure: [place];  Surgeon: Mauri Pole, MD;  [facility];  Service: Endoscopy;  Laterality: N/A;  . ABDOMINAL HYSTERECTOMY    . BACK SURGERY    . BILATERAL SALPINGECTOMY Bilateral 09/13/2012   Procedure: BILATERAL SALPINGECTOMY;  Surgeon: Lavonia Drafts, MD;  Location: [facility];  Service: Gynecology;  Laterality: Bilateral;  . CHOLECYSTECTOMY    . COLONOSCOPY    . ESOPHAGEAL MANOMETRY N/A 04/12/2017   Procedure: ESOPHAGEAL MANOMETRY (EM);  Surgeon: Mauri Pole, MD;  [facility];  Service: Endoscopy;  Laterality: N/A;  . LAPAROSCOPIC ASSISTED VAGINAL HYSTERECTOMY N/A 09/13/2012   Procedure: LAPAROSCOPIC ASSISTED VAGINAL HYSTERECTOMY;  Surgeon: Lavonia Drafts, MD;  Location: [facility];  Service: Gynecology;  Laterality: N/A;  . MELANOMA EXCISION  2011   stage I and II  . WISDOM TOOTH EXTRACTION  1989   Social History   Social History Narrative   Daily caffeine     Objective: Vital Signs: LMP 08/13/2012    Physical Exam   Musculoskeletal Exam: ***  CDAI Exam: No CDAI exam completed.    Investigation: No additional findings.   Imaging: No results found.  Speciality Comments: No specialty comments available.    Procedures:  No procedures performed Allergies: Seroquel [quetiapine fumarate];  Adhesive [tape]; Aleve [naproxen sodium]; Carbamazepine; and Mold extract [trichophyton]   Assessment / Plan:     Visit Diagnoses: ANA positive -  - 1:640 centromere, ENA negative, history of mild Raynauds, oral ulcers, fatigue.  Elevated sed rate  Other fatigue  Vitamin D deficiency  Raynaud's disease without gangrene  DDD (degenerative disc disease), cervical  DDD (degenerative disc disease), lumbar  Primary osteoarthritis of both hands  Primary osteoarthritis of left knee  History of melanoma  History of depression  History of headache  History of anemia    Orders: No orders of the defined types were placed in this encounter.  No orders of the defined types were placed in this encounter.   Face-to-face time spent with patient was *** minutes. 50% of time was spent  in counseling and coordination of care.  Follow-Up Instructions: No Follow-up on file.   Amy Littrell, RT  Note - This record has been created using [employer].  Chart creation errors have been sought, but may not always  have been located. Such creation errors do not reflect on  the standard of medical care.

## 2017-05-02 ENCOUNTER — Ambulatory Visit: Payer: BLUE CROSS/BLUE SHIELD | Admitting: Rheumatology

## 2017-05-09 ENCOUNTER — Other Ambulatory Visit: Payer: Self-pay | Admitting: Family Medicine

## 2017-05-25 ENCOUNTER — Ambulatory Visit (HOSPITAL_BASED_OUTPATIENT_CLINIC_OR_DEPARTMENT_OTHER)
Admission: RE | Admit: 2017-05-25 | Discharge: 2017-05-25 | Disposition: A | Discharge status: Home / Self Care | Payer: BLUE CROSS/BLUE SHIELD | Source: Ambulatory Visit | Attending: Obstetrics & Gynecology | Admitting: Obstetrics & Gynecology

## 2017-05-25 ENCOUNTER — Encounter (HOSPITAL_BASED_OUTPATIENT_CLINIC_OR_DEPARTMENT_OTHER): Payer: Self-pay

## 2017-05-25 DIAGNOSIS — Z1231 Encounter for screening mammogram for malignant neoplasm of breast: Secondary | ICD-10-CM | POA: Insufficient documentation

## 2017-05-25 DIAGNOSIS — Z1239 Encounter for other screening for malignant neoplasm of breast: Secondary | ICD-10-CM

## 2017-06-12 ENCOUNTER — Ambulatory Visit
Admission: RE | Admit: 2017-06-12 | Discharge: 2017-06-12 | Disposition: A | Discharge status: Home / Self Care | Payer: BLUE CROSS/BLUE SHIELD | Source: Ambulatory Visit | Attending: Surgery | Admitting: Surgery

## 2017-06-12 DIAGNOSIS — E041 Nontoxic single thyroid nodule: Secondary | ICD-10-CM

## 2017-06-23 ENCOUNTER — Telehealth: Payer: Self-pay | Admitting: Family Medicine

## 2017-06-23 NOTE — Telephone Encounter (Signed)
Pt did not pick up their Rx for SOMA dated 10/03/2016 Rx was shredded

## 2017-06-27 ENCOUNTER — Other Ambulatory Visit: Payer: Self-pay | Admitting: Family Medicine

## 2017-07-21 IMAGING — DX DG SHOULDER 2+V*R*
3 series · 3 of 3 positions shown · non-contrast
Comparison: None.

CLINICAL DATA: Shoulder pain for 2 months, no known injury, initial
encounter

EXAM:
RIGHT SHOULDER - 2+ VIEW

[shoulder grashey]
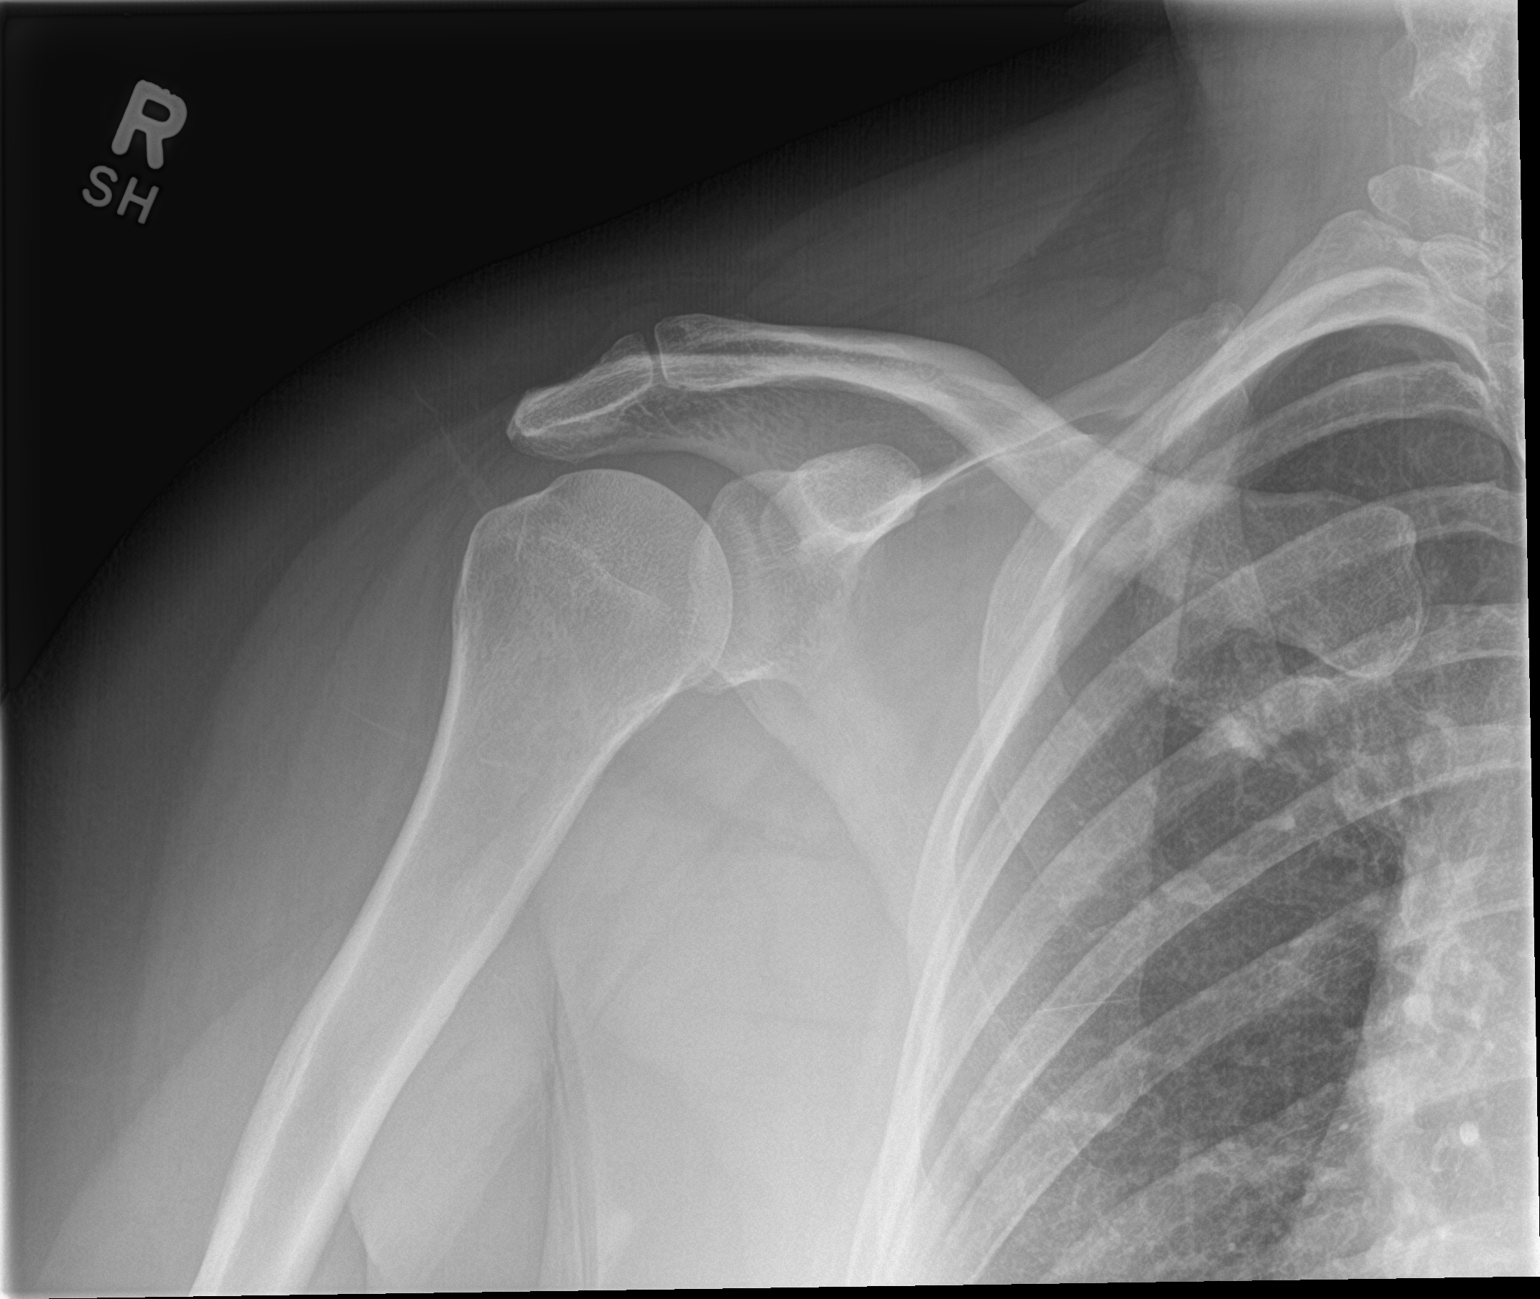

[shoulder y view]
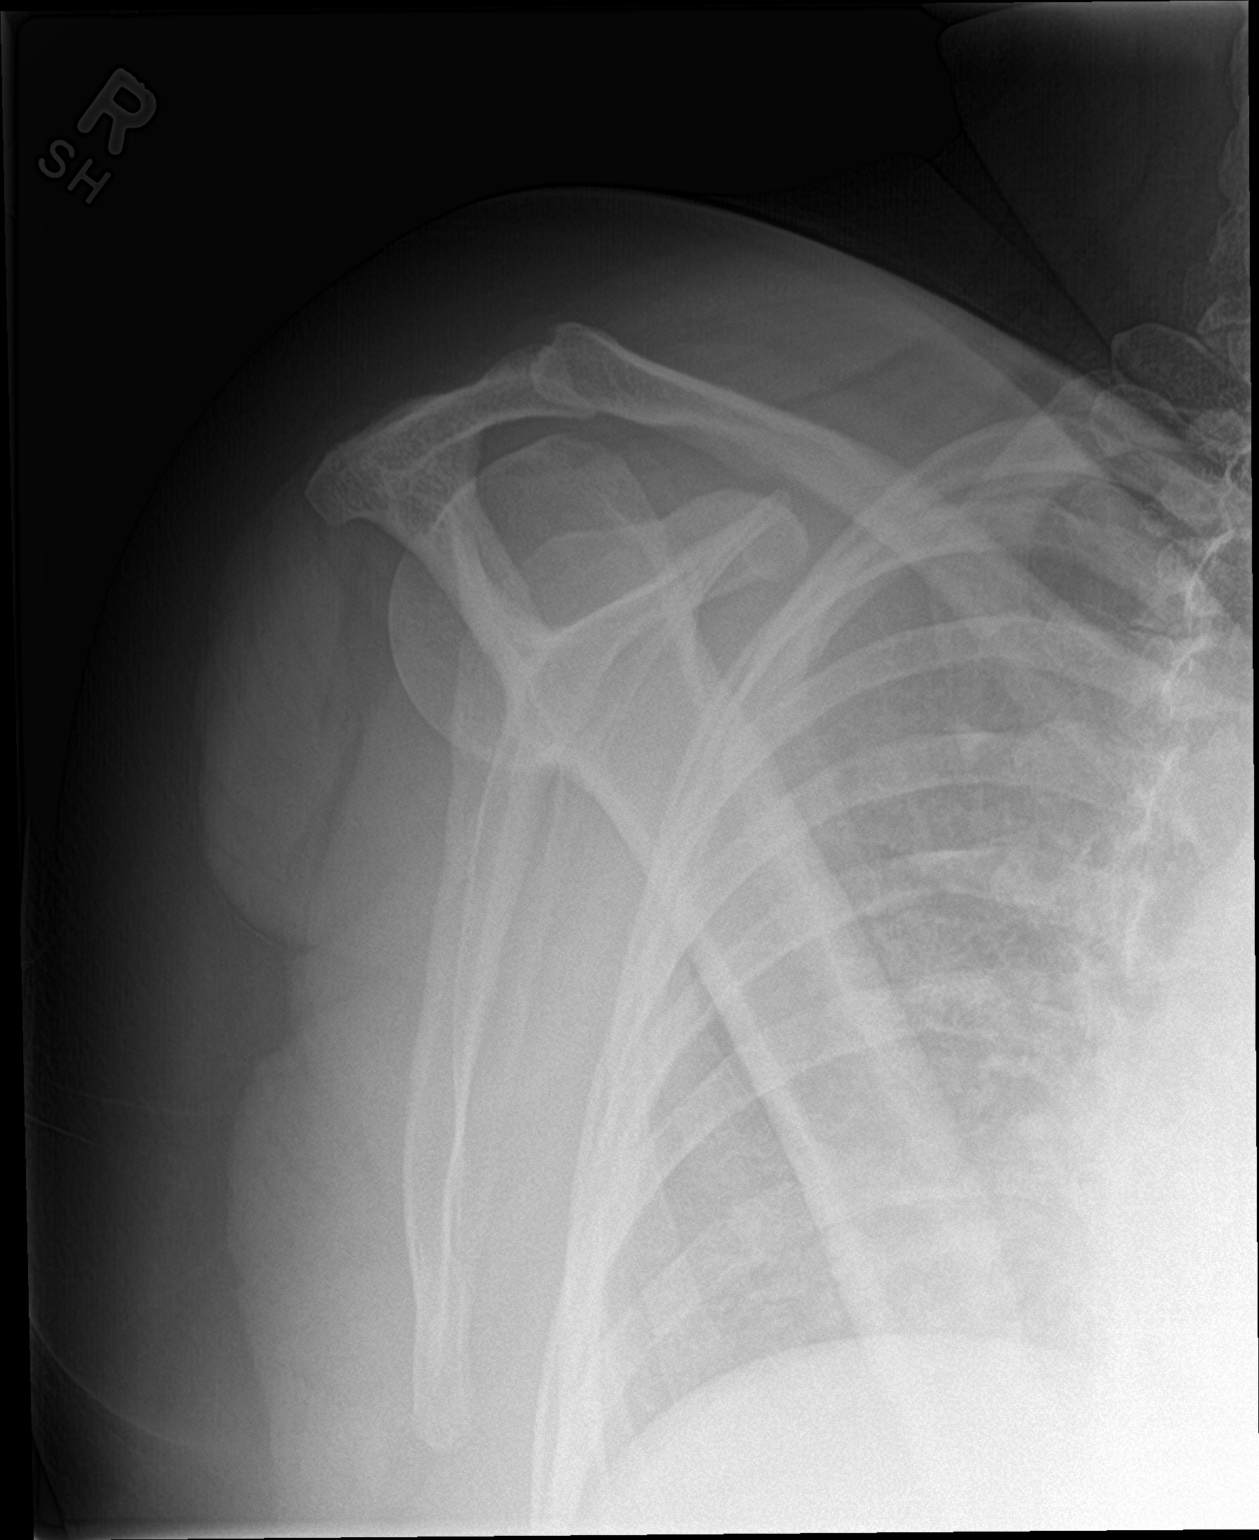

[shoulder axillary]
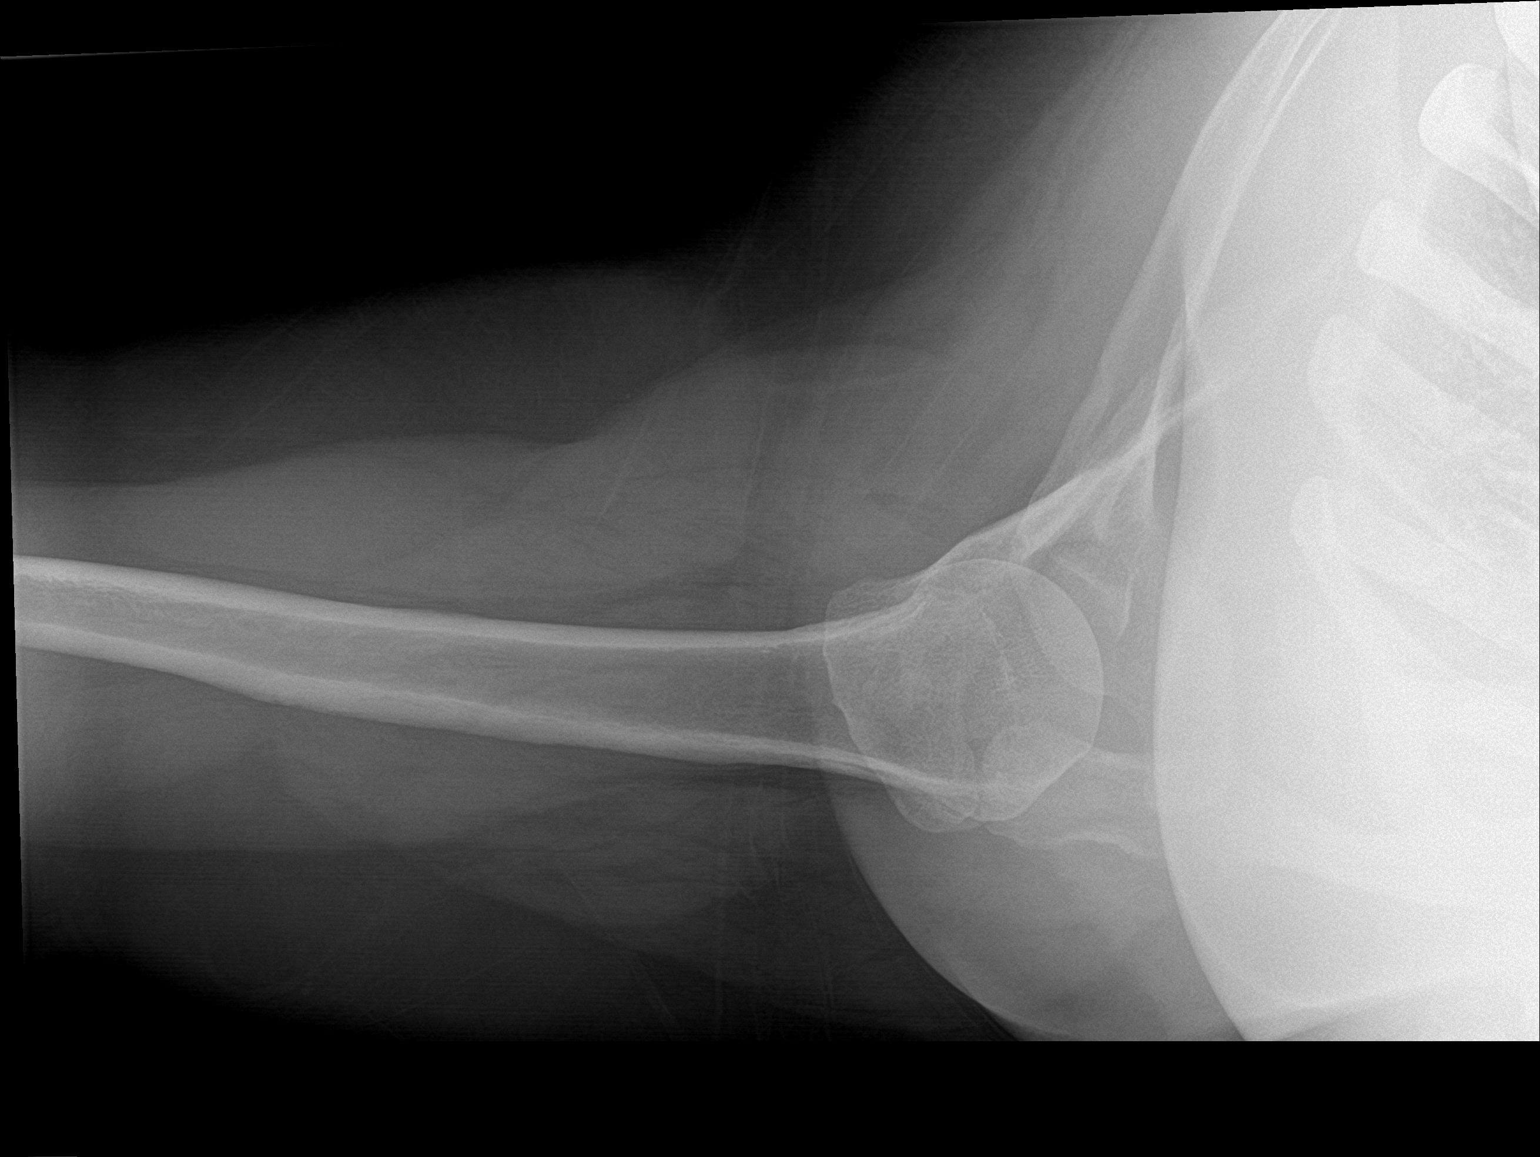

[3 of 3 positions shown; findings below may reference images not displayed]

FINDINGS: There is no evidence of fracture or dislocation. There is no
evidence of arthropathy or other focal bone abnormality. Soft
tissues are unremarkable.
IMPRESSION: No acute abnormality noted.

## 2017-07-21 IMAGING — DX DG KNEE COMPLETE 4+V*L*
4 series · 4 of 4 positions shown · non-contrast
Comparison: None

CLINICAL DATA: Left knee pain for 2 years.  No injury.

EXAM:
LEFT KNEE - COMPLETE 4+ VIEW

[knee ap]
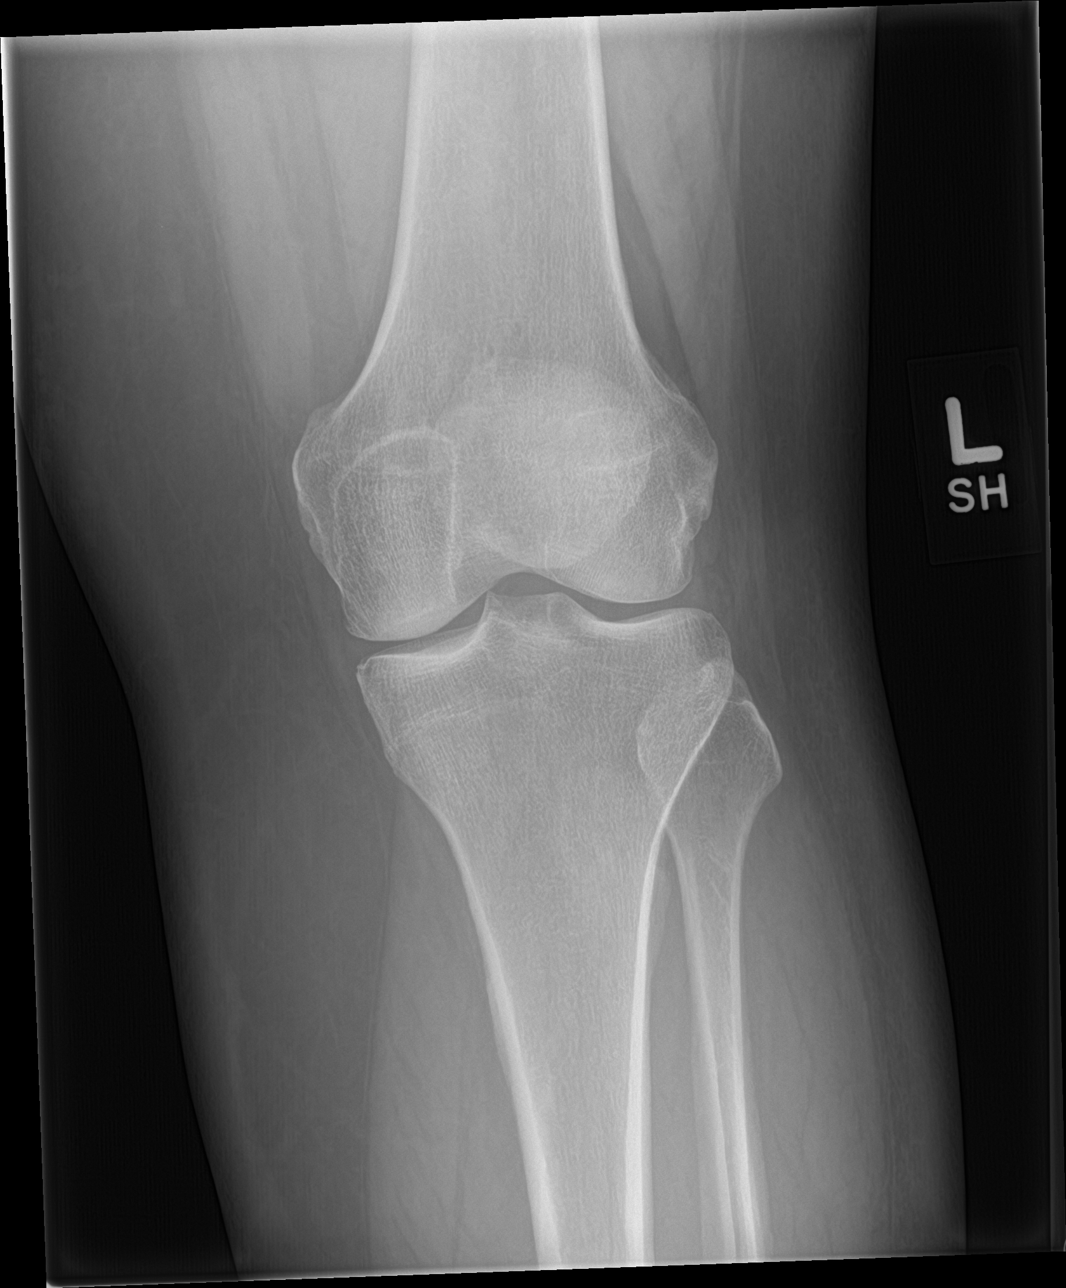

[knee lat]
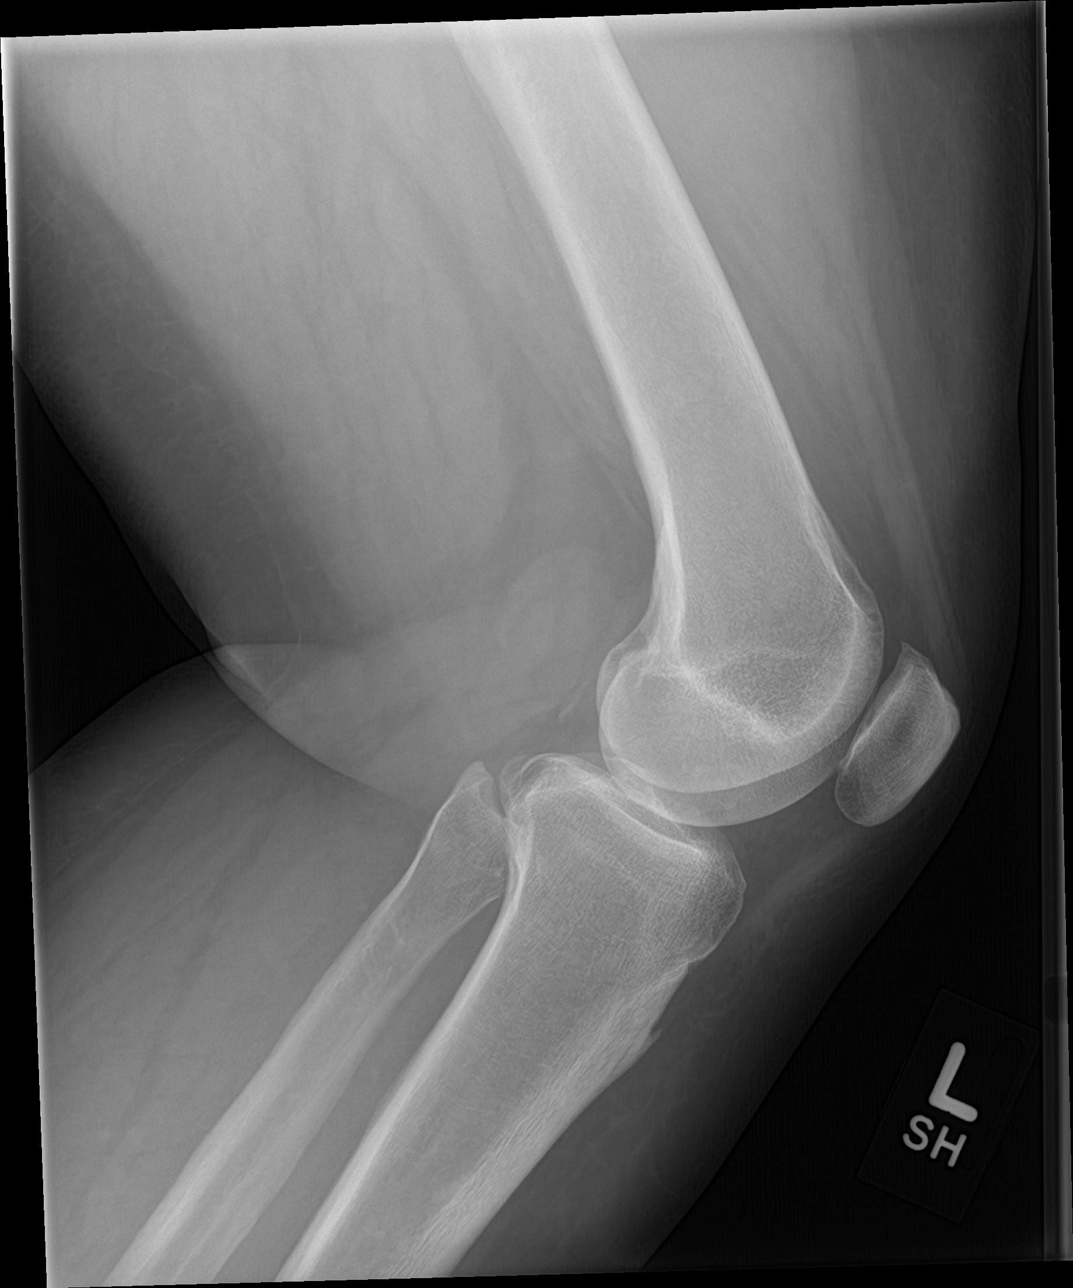

[knee obl (1 of 2)]
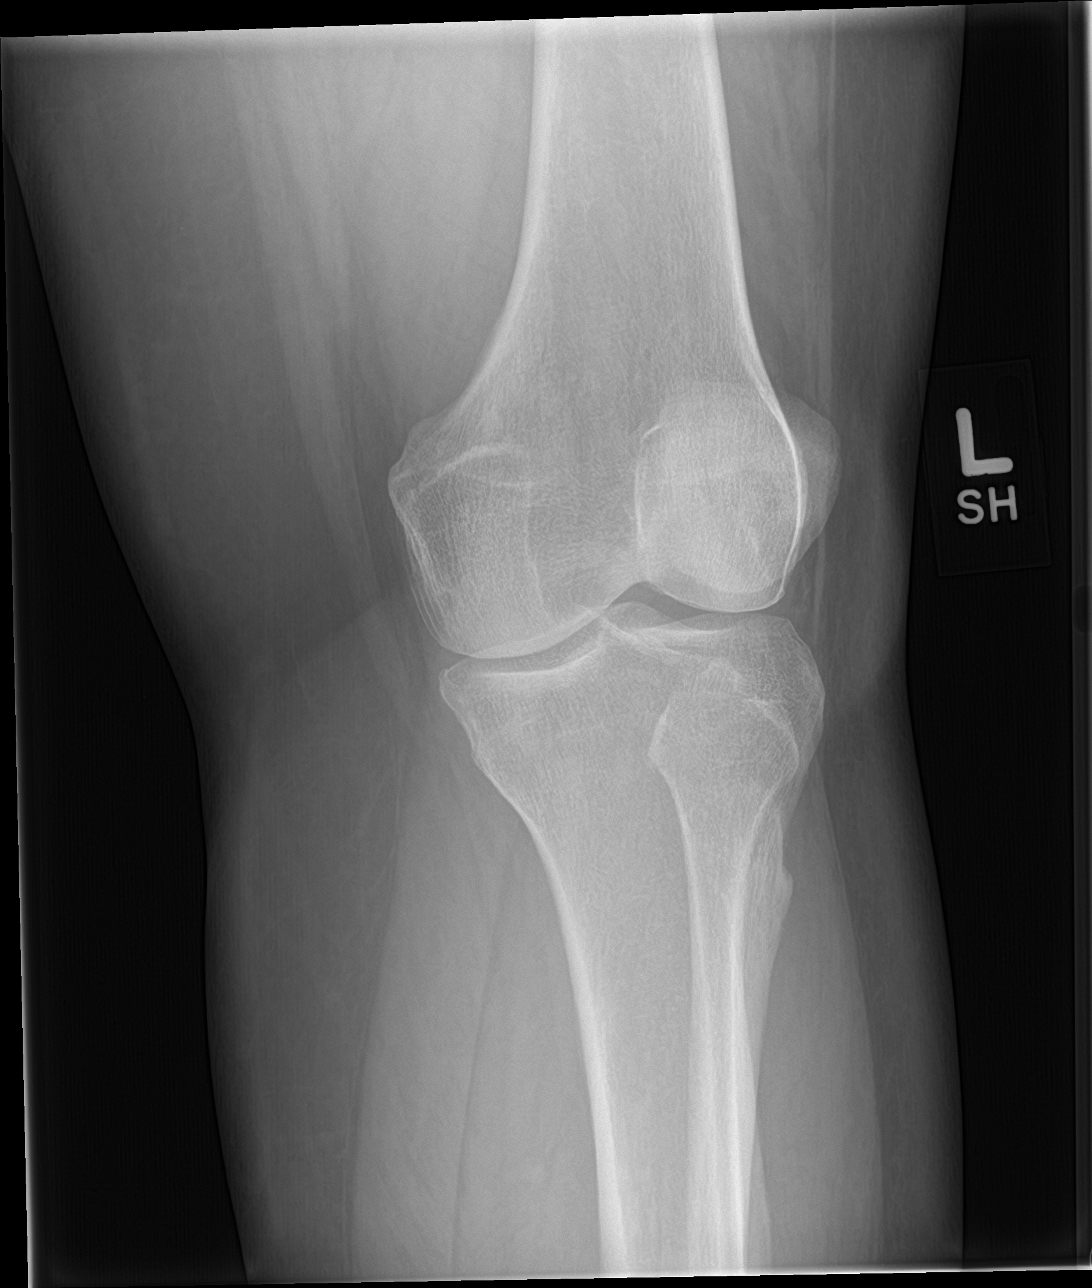

[knee obl (2 of 2)]
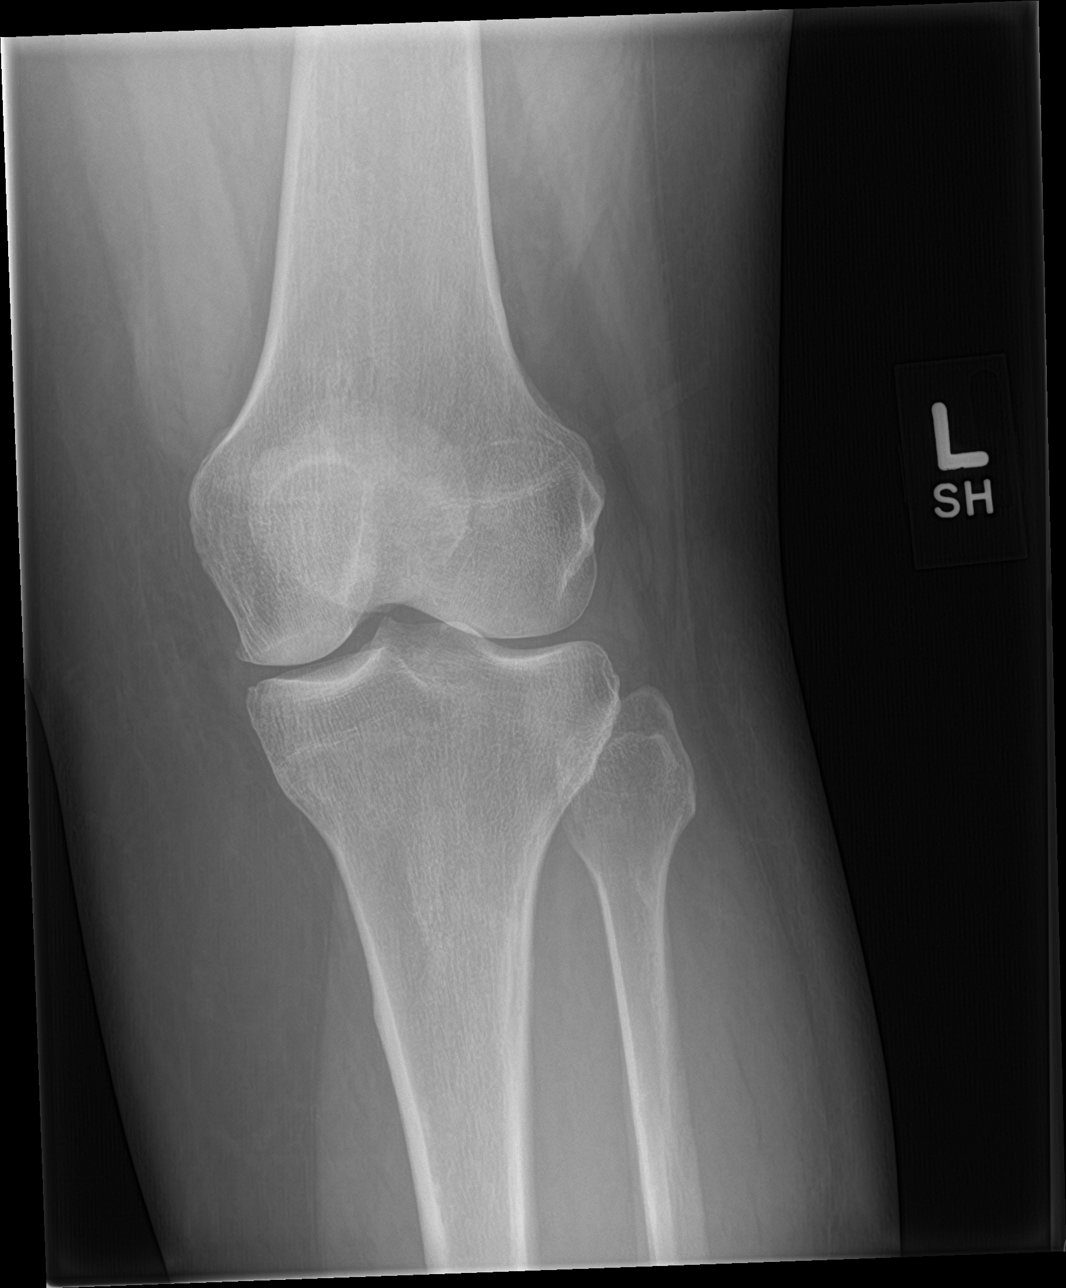

[4 of 4 positions shown; findings below may reference images not displayed]

FINDINGS: Early joint space narrowing and spurring in the patellofemoral
compartment. No acute bony abnormality. Specifically, no fracture,
subluxation, or dislocation. Soft tissues are intact.
IMPRESSION: Early osteoarthritic changes.  No acute bony abnormality.

## 2017-07-25 DIAGNOSIS — R946 Abnormal results of thyroid function studies: Secondary | ICD-10-CM | POA: Diagnosis not present

## 2017-07-25 DIAGNOSIS — K219 Gastro-esophageal reflux disease without esophagitis: Secondary | ICD-10-CM | POA: Diagnosis not present

## 2017-07-25 DIAGNOSIS — Z6841 Body Mass Index (BMI) 40.0 and over, adult: Secondary | ICD-10-CM | POA: Diagnosis not present

## 2017-07-25 DIAGNOSIS — Z1389 Encounter for screening for other disorder: Secondary | ICD-10-CM | POA: Diagnosis not present

## 2017-07-25 DIAGNOSIS — E782 Mixed hyperlipidemia: Secondary | ICD-10-CM | POA: Diagnosis not present

## 2017-07-25 DIAGNOSIS — R7309 Other abnormal glucose: Secondary | ICD-10-CM | POA: Diagnosis not present

## 2017-07-25 DIAGNOSIS — R1110 Vomiting, unspecified: Secondary | ICD-10-CM | POA: Diagnosis not present

## 2017-07-25 DIAGNOSIS — F329 Major depressive disorder, single episode, unspecified: Secondary | ICD-10-CM | POA: Diagnosis not present

## 2017-07-29 ENCOUNTER — Other Ambulatory Visit: Payer: Self-pay | Admitting: Family Medicine

## 2017-08-06 ENCOUNTER — Other Ambulatory Visit: Payer: Self-pay | Admitting: Gastroenterology

## 2017-08-15 ENCOUNTER — Ambulatory Visit: Payer: Self-pay | Admitting: Cardiovascular Disease

## 2017-08-16 ENCOUNTER — Encounter: Payer: Self-pay | Admitting: Cardiovascular Disease

## 2017-08-17 ENCOUNTER — Encounter: Payer: Self-pay | Admitting: Gastroenterology

## 2017-08-17 ENCOUNTER — Other Ambulatory Visit: Payer: Self-pay | Admitting: Family Medicine

## 2017-08-17 ENCOUNTER — Ambulatory Visit: Payer: BLUE CROSS/BLUE SHIELD | Admitting: Gastroenterology

## 2017-08-17 VITALS — BP 118/78 | HR 76 | Ht 69.0 in | Wt 313.0 lb

## 2017-08-17 DIAGNOSIS — R1013 Epigastric pain: Secondary | ICD-10-CM

## 2017-08-17 DIAGNOSIS — R197 Diarrhea, unspecified: Secondary | ICD-10-CM | POA: Diagnosis not present

## 2017-08-17 DIAGNOSIS — R112 Nausea with vomiting, unspecified: Secondary | ICD-10-CM | POA: Diagnosis not present

## 2017-08-17 DIAGNOSIS — K920 Hematemesis: Secondary | ICD-10-CM | POA: Diagnosis not present

## 2017-08-17 MED ORDER — PEG-KCL-NACL-NASULF-NA ASC-C 140 G PO SOLR: 1.0000 | ORAL | 0 refills | Status: DC

## 2017-08-17 MED ORDER — ONDANSETRON HCL 4 MG PO TABS: 4.0000 mg | ORAL_TABLET | Freq: Four times a day (QID) | ORAL | 1 refills | Status: DC | PRN

## 2017-08-17 NOTE — Progress Notes (Signed)
08/17/2017 [name] [id number] 1971-01-28   HISTORY OF PRESENT ILLNESS:  This is a 47 year old female who has been seen here on multiple occasions for complaints of persistent nausea and vomiting.  See my note from 04/10/2017 for details regarding this issue.  She comes in today with the same complaints, nausea and vomiting.  Reports seeing some blood in her emesis.  Now also has epigastric abdominal pain.  Continues with diarrhea.  At the time of her last visit I ordered an esophageal manometry, which was normal.  Also ordered 24-hour pH study while on her PPI medication.  She says that she vomited the probe back out again at less than 8 hours, but in the time it was in place the study was normal.  Since then she is continued on her Dexilant 60 mg daily.  I had given her Zofran that I wanted her to take around the clock, but she does not recall that and says that she never got the medication.  I had also prescribed Questran for her diarrhea to begin taking once daily and said that we could increase it if needed.  She took it for short time, but then discontinued it.  She tells me that she feels like her reflux issues are well controlled on her Dexilant 60 mg daily.  Her weight is down 10 pounds.  Her husband is with her today in order to express his concern.  Says that her symptoms are controlling her life.   Past Medical History:  Diagnosis Date  . Abdominal pain 03/18/2016  . Allergy   . Anemia    in the past  . Anxiety   . Anxiety disorder   . Bipolar depression ([place]) 05/29/2016  . Breast lesion 12/06/2015  . Chicken pox   . Complication of anesthesia 1999   urinary retention  . Depression    in teh past- ok now  . Diarrhea 03/18/2016  . Fibromyalgia   . GERD (gastroesophageal reflux disease)   . Hyperglycemia 03/18/2016  . Hyperlipidemia 08/07/2015  . Left thyroid nodule 03/31/2013  . Melanoma ([place])    right hip and knee  . Migraine   . Neck pain on right side 12/23/2012  .  Obesity   . Pain 03/31/2013  . Palpitations 08/10/2014  . Shortness of breath    when anemic  . Sinusitis 08/07/2015  . Skin lesion of breast 12/06/2015  . TIA (transient ischemic attack) 03/31/2013  . UTI (lower urinary tract infection)    Past Surgical History:  Procedure Laterality Date  . [place] STUDY N/A 04/12/2017   Procedure: [place];  Surgeon: Mauri Pole, MD;  [facility];  Service: Endoscopy;  Laterality: N/A;  . ABDOMINAL HYSTERECTOMY    . BACK SURGERY    . BILATERAL SALPINGECTOMY Bilateral 09/13/2012   Procedure: BILATERAL SALPINGECTOMY;  Surgeon: Lavonia Drafts, MD;  Location: [facility];  Service: Gynecology;  Laterality: Bilateral;  . CHOLECYSTECTOMY    . COLONOSCOPY    . ESOPHAGEAL MANOMETRY N/A 04/12/2017   Procedure: ESOPHAGEAL MANOMETRY (EM);  Surgeon: Mauri Pole, MD;  [facility];  Service: Endoscopy;  Laterality: N/A;  . LAPAROSCOPIC ASSISTED VAGINAL HYSTERECTOMY N/A 09/13/2012   Procedure: LAPAROSCOPIC ASSISTED VAGINAL HYSTERECTOMY;  Surgeon: Lavonia Drafts, MD;  Location: [facility];  Service: Gynecology;  Laterality: N/A;  . MELANOMA EXCISION  2011   stage I and [place]  reports that  has never smoked. she has never used smokeless tobacco. She reports that she does not drink alcohol or use drugs. family history includes Arthritis in her father, maternal grandmother, other, and paternal grandmother; Asthma in her mother and paternal grandmother; CVA in her maternal grandmother; Cancer in her maternal grandfather and other; Colitis in her father; Dementia in her maternal grandmother; Diabetes in her paternal grandfather and unknown relative; Eczema in her mother; Emphysema in her maternal uncle; Heart attack (age of onset: 84) in her father; Heart disease in her father and paternal grandmother; Hyperlipidemia in her other and paternal grandfather; Hypertension in her  paternal grandfather; Irritable bowel syndrome in her unknown relative; Kidney disease in her paternal grandfather and unknown relative; Lupus in her maternal grandmother; Mental illness in her other; Other in her mother. Allergies  Allergen Reactions  . Seroquel [Quetiapine Fumarate] Nausea And Vomiting    Nightmares and vomiting   . Adhesive [Tape] Dermatitis    Band- Aid adhesive  . Aleve [Naproxen Sodium]     Makes hands, legs, and feet swell  . Carbamazepine     Hallucinations and dizziness  . Mold Extract [Trichophyton]       Outpatient Encounter Medications as of 08/17/2017  Medication Sig  . buPROPion (WELLBUTRIN XL) 300 MG 24 hr tablet Take 300 mg by mouth daily.  . cetirizine (ZYRTEC) 10 MG tablet Take 1 tablet (10 mg total) by mouth daily.  . cholecalciferol (VITAMIN D) 1000 units tablet Take 2,000 Units by mouth daily.  . cholestyramine (QUESTRAN) 4 g packet Take 1 packet in juice or water daily.  Marland Kitchen DEXILANT 60 MG capsule TAKE 1 CAPSULE(60 MG) BY MOUTH DAILY  . loperamide (IMODIUM) 2 MG capsule Take by mouth as needed for diarrhea or loose stools.  . ondansetron (ZOFRAN ODT) 4 MG disintegrating tablet Take 1 tablet (4 mg total) by mouth every 8 (eight) hours as needed.  . pantoprazole (PROTONIX) 40 MG tablet Take 40 mg by mouth 2 (two) times daily.  . Vitamin D, Ergocalciferol, (DRISDOL) 50000 units CAPS capsule TAKE 1 CAPSULE BY MOUTH EVERY 7 DAYS- THEN TAKE OTC VITAMIN DAILY 2000 IU DAILY   No facility-administered encounter medications on file as of 08/17/2017.      REVIEW OF SYSTEMS  : All other systems reviewed and negative except where noted in the History of Present Illness.   PHYSICAL EXAM: LMP 08/13/2012  General: Well developed white female in no acute distress Head: Normocephalic and atraumatic Eyes:  Sclerae anicteric, conjunctiva pink. Ears: Normal auditory acuity Lungs: Clear throughout to auscultation; no increased WOB. Heart: Regular rate and rhythm;  no M/R/G. Abdomen: Soft, non-distended.  BS present.  Epigastric TTP. Rectal:  Will be done at the time of colonoscopy. Musculoskeletal: Symmetrical with no gross deformities  Skin: No lesions on visible extremities Extremities: No edema  Neurological: Alert oriented x 4, grossly non-focal Psychological:  Alert and cooperative. Normal mood and affect  ASSESSMENT AND PLAN: *Persisent nausea and vomiting and now with epigastric pain as well.  Feels like her GERD is well controlled on Dexilant 60 mg daily.  She never got and took the zofran ATC as I recommended at her last visit.  Reports seeing blood in her emesis.  Will schedule for repeat EGD.  I suspect that maybe she has some gastroparesis so I think that a GES is a good idea as well.  Due to the epigastric pain and the fact that she really hasn't had any cross-sectional  imaging, we can consider CT scan as well. *Diarrhea:  Thought to be bile-salt related.  She took [place] once a day for a short time, but did not continue it.  Will schedule colonoscopy with random biopsies to rule out microscopic colitis.   CC:  Sharilyn Sites, MD

## 2017-08-17 NOTE — Progress Notes (Signed)
Noted  

## 2017-08-17 NOTE — Patient Instructions (Signed)
We have sent the following medications to your pharmacy for you to pick up at your convenience: Zofran 4 mg every 6 hours as needed.    You have been scheduled for a gastric emptying scan at [facility] on 08/22/17 at 7:30 am. Please arrive at least 15 minutes prior to your appointment for registration. Please make certain not to have anything to eat or drink after midnight the night before your test. Hold all stomach medications (ex: Zofran, phenergan, Reglan) 4-6 hours prior to your test. If you need to reschedule your appointment, please contact radiology scheduling at [phone]. _____________________________________________________________________ A gastric-emptying study measures how long it takes for food to move through your stomach. There are several ways to measure stomach emptying. In the most common test, you eat food that contains a small amount of radioactive material. A scanner that detects the movement of the radioactive material is placed over your abdomen to monitor the rate at which food leaves your stomach. This test normally takes about 4 hours to complete. _____________________________________________________________________  [name] have been scheduled for a CT scan of the abdomen and pelvis at [facility] ([address]---this is in the same building as [facility]).   You are scheduled on 08/23/17 at 10:15 am. You should arrive 15 minutes prior to your appointment time for registration. Please follow the written instructions below on the day of your exam:  WARNING: IF YOU ARE ALLERGIC TO IODINE/X-RAY DYE, PLEASE NOTIFY RADIOLOGY IMMEDIATELY AT [phone]! YOU WILL BE GIVEN A 13 HOUR PREMEDICATION PREP.  1) Do not eat anything after 6:15 am (4 hours prior to your test) 2) You have been given 2 bottles of oral contrast to drink. The solution may taste               better if refrigerated, but do NOT add ice or any other liquid to this solution.  Shake             well before drinking.    Drink 1 bottle of contrast @ 8:15 am (2 hours prior to your exam)  Drink 1 bottle of contrast @ 9:15 am (1 hour prior to your exam)  You may take any medications as prescribed with a small amount of water except for the following: Metformin, Glucophage, Glucovance, Avandamet, Riomet, Fortamet, Actoplus Met, Janumet, Glumetza or Metaglip. The above medications must be held the day of the exam AND 48 hours after the exam.  The purpose of you drinking the oral contrast is to aid in the visualization of your intestinal tract. The contrast solution may cause some diarrhea. Before your exam is started, you will be given a small amount of fluid to drink. Depending on your individual set of symptoms, you may also receive an intravenous injection of x-ray contrast/dye. Plan on being at [facility] for 30 minutes or longer, depending on the type of exam you are having performed.  This test typically takes 30-45 minutes to complete.  If you have any questions regarding your exam or if you need to reschedule, you may call the CT department at [phone] between the hours of 8:00 am and 5:00 pm, Monday-Friday.  You have been scheduled for an endoscopy and colonoscopy. Please follow the written instructions given to you at your visit today. Please pick up your prep supplies at the pharmacy within the next 1-3 days. If you use inhalers (even only as needed), please bring them with you on the day of your procedure. Your  physician has requested that you go to www.startemmi.com and enter the access code given to you at your visit today. This web site gives a general overview about your procedure. However, you should still follow specific instructions given to you by our office regarding your preparation for the procedure.   ________________________________________________________________________

## 2017-08-18 ENCOUNTER — Encounter: Payer: Self-pay | Admitting: Gastroenterology

## 2017-08-18 ENCOUNTER — Other Ambulatory Visit: Payer: Self-pay

## 2017-08-18 ENCOUNTER — Telehealth: Payer: Self-pay

## 2017-08-18 MED ORDER — OMEPRAZOLE 40 MG PO CPDR: 40.0000 mg | DELAYED_RELEASE_CAPSULE | Freq: Every day | ORAL | 3 refills | Status: DC

## 2017-08-18 NOTE — Telephone Encounter (Signed)
Error

## 2017-08-18 NOTE — Telephone Encounter (Signed)
Patient has not submitted stool study yet.

## 2017-08-22 ENCOUNTER — Telehealth: Payer: Self-pay | Admitting: Gastroenterology

## 2017-08-22 ENCOUNTER — Encounter (HOSPITAL_COMMUNITY): Payer: BLUE CROSS/BLUE SHIELD

## 2017-08-22 NOTE — Telephone Encounter (Signed)
Faxed over order to [facility] for CT abdomen/pelvis.

## 2017-08-22 NOTE — Telephone Encounter (Signed)
[facility] calling stating they need and order faxed over to [phone] for pt CT of abd and pelvis tomorrow 2.6.19.

## 2017-08-23 ENCOUNTER — Other Ambulatory Visit: Payer: Self-pay

## 2017-08-23 ENCOUNTER — Encounter: Payer: Self-pay | Admitting: Gastroenterology

## 2017-08-23 ENCOUNTER — Telehealth: Payer: Self-pay

## 2017-08-23 DIAGNOSIS — K7689 Other specified diseases of liver: Secondary | ICD-10-CM | POA: Diagnosis not present

## 2017-08-23 DIAGNOSIS — N281 Cyst of kidney, acquired: Secondary | ICD-10-CM | POA: Diagnosis not present

## 2017-08-23 DIAGNOSIS — K449 Diaphragmatic hernia without obstruction or gangrene: Secondary | ICD-10-CM | POA: Diagnosis not present

## 2017-08-23 DIAGNOSIS — N83202 Unspecified ovarian cyst, left side: Secondary | ICD-10-CM | POA: Diagnosis not present

## 2017-08-23 NOTE — Telephone Encounter (Signed)
-----   Message from Loralie Champagne, PA-C sent at 08/23/2017 12:56 PM EST ----- Please let the patient know that her CT scan did not show any cause of her symptoms.  Small incidental findings of a tiny ovarian cyst, small hiatal hernia, benign renal cyst, benign liver cyst all noted but no intervention needed.  It did also show advanced degenerative disc changes in her lower spine.  Will await results of EGD, colonoscopy, GES.  Thank you,  Jess

## 2017-08-24 NOTE — Telephone Encounter (Signed)
The pt has been advised and will keep appts as scheduled

## 2017-08-25 NOTE — Progress Notes (Addendum)
Office Visit Note  Patient: [name]             Date of Birth: 1971/04/22           MRN: [id number]             PCP: Sharilyn Sites, MD Referring: Sharilyn Sites, MD Visit Date: 09/06/2017 Occupation: @GUAROCC @    Subjective:  Pain in both hands    History of Present Illness: [name] is a 47 y.o. female with history of positive ANA, osteoarthritis, DDD, and fibromyalgia.  Patient states that she is having increased pain in multiple joints including her neck, bilateral shoulders, bilateral knees, bilateral hands and wrists.  She states that her hands and wrists feel "like they are on fire" and are "heavy and tight."  She states her hands have been red and swelling.  She describes the pain as a dull throbbing pain.  She states at her last visit there was discussion about getting an ultrasound performed but she never had it done.  She reports decreased grip strength bilaterally.  She states that she has started to have similar symptoms in her feet.  She has "pins and needles" in her feet, worse at night. She takes Advil for pain, but she reports it makes her swelling worse.   She states that occasionally she has mouth sores.  She also has been having increased eye dryness.  She denies using any eye drops.   She states she continues to take Vitamin D 50,000 units once weekly.    Activities of Daily Living:  Patient reports morning stiffness for 2-3  hours.   Patient Reports nocturnal pain.  Difficulty dressing/grooming: Denies Difficulty climbing stairs: Reports Difficulty getting out of chair: Reports Difficulty using hands for taps, buttons, cutlery, and/or writing: Reports   Review of Systems  Constitutional: Positive for fatigue. Negative for night sweats, weight gain, weight loss and weakness.  HENT: Positive for mouth sores. Negative for trouble swallowing, trouble swallowing, mouth dryness and nose dryness.   Eyes: Positive for dryness. Negative for pain,  redness and visual disturbance.  Respiratory: Negative for cough, shortness of breath and difficulty breathing.   Cardiovascular: Negative for chest pain, palpitations, hypertension, irregular heartbeat and swelling in legs/feet.  Gastrointestinal: Positive for diarrhea and vomiting. Negative for blood in stool and constipation.  Endocrine: Negative for increased urination.  Genitourinary: Negative for vaginal dryness.  Musculoskeletal: Positive for arthralgias, joint pain, joint swelling, myalgias, morning stiffness and myalgias. Negative for muscle weakness and muscle tenderness.  Skin: Negative for color change, rash, hair loss, skin tightness, ulcers and sensitivity to sunlight.  Allergic/Immunologic: Negative for susceptible to infections.  Neurological: Negative for dizziness, memory loss and night sweats.  Hematological: Negative for swollen glands.  Psychiatric/Behavioral: Positive for depressed mood and sleep disturbance. The patient is not nervous/anxious.     PMFS History:  Patient Active Problem List   Diagnosis Date Noted  . Hematemesis without nausea 08/17/2017  . Abdominal pain, epigastric 08/17/2017  . Regurgitation of food   . 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 Vasomotor symptoms due to menopause 11/23/2016  . History of melanoma 10/28/2016  . DJD (degenerative joint disease), cervical 10/25/2016  . 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 Primary osteoarthritis of both hands 10/25/2016  . Primary osteoarthritis of left knee 10/25/2016  . Fibromyalgia 10/25/2016  . Allergic rhinitis with a probable non-allergic component 10/24/2016  . ANA positive 10/21/2016  . Elevated sed rate 05/29/2016  . Bipolar depression (  [place]) 05/29/2016  . Fever 05/23/2016  . Other complicated headache syndrome 05/23/2016  . Rash and nonspecific skin eruption 05/23/2016  . Hyperglycemia 03/18/2016  . Diarrhea 03/18/2016  . Abdominal pain 03/18/2016  . Perimenopausal 01/31/2016  . Vitamin D  deficiency 01/31/2016  . Myalgia 01/31/2016  . Skin lesion of breast 12/06/2015  . Nausea and vomiting 08/12/2015  . Dysphagia 08/12/2015  . Peptic stricture of esophagus 08/12/2015  . Hyperlipidemia 08/07/2015  . Left foot pain 08/07/2015  . Recurrent sinusitis 08/07/2015  . Back pain 08/19/2014  . Palpitations 08/10/2014  . Pain 03/31/2013  . Neck pain on right side 12/23/2012  . Fibroids 07/04/2012  . Menorrhagia 06/25/2012  . Hypertriglyceridemia 04/23/2012  . Annual physical exam 12/15/2011  . Obesity 12/10/2010  . Lower extremity edema 12/10/2010  . Iron deficiency anemia 08/18/2009  . GERD 08/18/2009    Past Medical History:  Diagnosis Date  . Abdominal pain 03/18/2016  . Allergy   . Anemia    in the past  . Anxiety   . Anxiety disorder   . Bipolar depression ([place]) 05/29/2016  . Breast lesion 12/06/2015  . Chicken pox   . Complication of anesthesia 1999   urinary retention  . Depression    in teh past- ok now  . Diarrhea 03/18/2016  . Fibromyalgia   . GERD (gastroesophageal reflux disease)   . Hyperglycemia 03/18/2016  . Hyperlipidemia 08/07/2015  . Left thyroid nodule 03/31/2013  . Melanoma ([place])    right hip and knee  . Migraine   . Neck pain on right side 12/23/2012  . Obesity   . Pain 03/31/2013  . Palpitations 08/10/2014  . Shortness of breath    when anemic  . Sinusitis 08/07/2015  . Skin lesion of breast 12/06/2015  . TIA (transient ischemic attack) 03/31/2013  . UTI (lower urinary tract infection)     Family History  Problem Relation Age of Onset  . Colitis Father        crohn's  . Heart disease Father        grandfather  . Heart attack Father 37  . Arthritis Father        s/p back surgery  . Other Mother        neurological autoimmune disease, chorea in trunk  . Eczema Mother   . Asthma Mother   . Arthritis Maternal Grandmother   . Lupus Maternal Grandmother   . Dementia Maternal Grandmother   . CVA Maternal Grandmother   . Arthritis Paternal  Grandmother   . Heart disease Paternal Grandmother        chf  . Asthma Paternal Grandmother   . Diabetes Paternal Grandfather   . Kidney disease Paternal Grandfather   . Hypertension Paternal Grandfather   . Hyperlipidemia Paternal Grandfather   . Cancer Maternal Grandfather        bone  . Diabetes Unknown        grandfather  . Irritable bowel syndrome Unknown   . Kidney disease Unknown        grandfather  . Arthritis Other   . Cancer Other        breast  . Hyperlipidemia Other   . Mental illness Other   . Emphysema Maternal Uncle   . Colon cancer Neg Hx   . Allergic rhinitis Neg Hx   . Angioedema Neg Hx   . Immunodeficiency Neg Hx   . Urticaria Neg Hx    Past Surgical History:  Procedure Laterality Date  .  [place] STUDY N/A 04/12/2017   Procedure: [facility] [place];  Surgeon: Mauri Pole, MD;  Location: [facility];  Service: Endoscopy;  Laterality: N/A;  . ABDOMINAL HYSTERECTOMY    . BACK SURGERY    . BILATERAL SALPINGECTOMY Bilateral 09/13/2012   Procedure: BILATERAL SALPINGECTOMY;  Surgeon: Lavonia Drafts, MD;  Location: [facility];  Service: Gynecology;  Laterality: Bilateral;  . CHOLECYSTECTOMY    . COLONOSCOPY    . ESOPHAGEAL MANOMETRY N/A 04/12/2017   Procedure: ESOPHAGEAL MANOMETRY (EM);  Surgeon: Mauri Pole, MD;  Location: [facility];  Service: Endoscopy;  Laterality: N/A;  . LAPAROSCOPIC ASSISTED VAGINAL HYSTERECTOMY N/A 09/13/2012   Procedure: LAPAROSCOPIC ASSISTED VAGINAL HYSTERECTOMY;  Surgeon: Lavonia Drafts, MD;  Location: [facility];  Service: Gynecology;  Laterality: N/A;  . MELANOMA EXCISION  2011   stage I and II  . WISDOM TOOTH EXTRACTION  1989   Social History   Social History Narrative   Daily caffeine     Objective: Vital Signs: BP 132/84 (BP Location: Left Arm, Patient Position: Sitting, Cuff Size: Large)   Pulse 75   Resp 17   Ht 5\' 9"  (1.753 m)   Wt (!) 312 lb (141.5 kg)   LMP 08/13/2012    BMI 46.07 kg/m    Physical Exam  Constitutional: She is oriented to person, place, and time. She appears well-developed and well-nourished.  HENT:  Head: Normocephalic and atraumatic.  Eyes: Conjunctivae and EOM are normal.  Neck: Normal range of motion.  Cardiovascular: Normal rate, regular rhythm, normal heart sounds and intact distal pulses.  Pulmonary/Chest: Effort normal and breath sounds normal.  Abdominal: Soft. Bowel sounds are normal.  Lymphadenopathy:    She has no cervical adenopathy.  Neurological: She is alert and oriented to person, place, and time.  Skin: Skin is warm and dry. Capillary refill takes less than 2 seconds.  Psychiatric: She has a normal mood and affect. Her behavior is normal.  Nursing note and vitals reviewed.    Musculoskeletal Exam: C-spine limited ROM thoracic, and lumbar spine good ROM.  Shoulder joints limited abduction.  Elbow joints, wrist joints, MCPs, PIPs, and DIPs good ROM with no synovitis.  She has tenderness of MCPs and PIP joints.  Hip joints, knee joints, ankle joints, MTPs, PIPs, and DIPs good ROM with no synovitis.  Bilateral knee crepitus.  No warmth or effusion of knees.  No tenderness of trochanteric bursa.  No midline spinal tenderness or SI joint tenderness.    CDAI Exam: No CDAI exam completed.    Investigation: No additional findings.  CBC Latest Ref Rng & Units 03/21/2017 10/27/2016 07/05/2016  WBC 4.0 - 10.5 K/uL 9.9 8.6 9.4  Hemoglobin 12.0 - 15.0 g/dL 14.4 14.1 14.2  Hematocrit 36.0 - 46.0 % 43.6 41.8 42.0  Platelets 150 - 400 K/uL 280 322 279.0   CMP Latest Ref Rng & Units 03/21/2017 07/05/2016 05/23/2016  Glucose 65 - 99 mg/dL 97 99 73  BUN 6 - 20 mg/dL 19 15 14   Creatinine 0.44 - 1.00 mg/dL 1.08(H) 0.80 0.69  Sodium 135 - 145 mmol/L 138 138 137  Potassium 3.5 - 5.1 mmol/L 3.9 4.2 4.1  Chloride 101 - 111 mmol/L 101 101 103  CO2 22 - 32 mmol/L 26 27 25   Calcium 8.9 - 10.3 mg/dL 9.9 9.2 9.3  Total Protein 6.5 - 8.1 g/dL  7.8 7.0 7.2  Total Bilirubin 0.3 - 1.2 mg/dL 0.3 0.5 0.4  Alkaline Phos 38 - 126  U/L 94 84 80  AST 15 - 41 U/L 29 26 20   ALT 14 - 54 U/L 38 44(H) 27   Imaging: No results found.  Speciality Comments: No specialty comments available.    Procedures:  No procedures performed Allergies: Seroquel [quetiapine fumarate]; Adhesive [tape]; Aleve [naproxen sodium]; Carbamazepine; and Mold extract [trichophyton]   Assessment / Plan:     Visit Diagnoses: Positive ANA (antinuclear antibody) - 1:640 centromere, ENA -hx of mild Raynaud's, oral ulcers, fatigue.  She continues to have chronic fatigue that is likely related to fibromyalgia.  She has arthralgias and myalgias.  We are going to schedule an ultrasound of bilateral hands due to her increased hand pain.  She has no synovitis on exam.  No cervical lymphadenopathy.  No rashes noted.  She continues to have symptoms of raynaud's.  No signs of sclerodactyly.  No digital ulcers noted.    Primary osteoarthritis of both hands: She has tenderness in bilateral hands and wrists.  No synovitis on exam.  She has complete fist formation bilaterally.  She was given a handout of natural anti-inflammatories she can try.  An ultrasound will be scheduled to rule out any active synovitis or erosions.    Primary osteoarthritis of left knee: She has bilateral knee crepitus.  No warmth or effusion.    DDD (degenerative disc disease), cervical: She has stiffness in her neck.  She has limitation with lateral rotation.  Good flexion and extension.    DDD (degenerative disc disease), lumbar: Chronic pain. No midline spinal tenderness.    Fibromyalgia: She has muscle tenderness and generalized pain.  She also has fatigue and insomnia.  She was given a refill of Gabapentin 300 mg daily at bedtime.  Potential side effects were discussed.     Vitamin D deficiency - She is on 50,000 units of vitamin D once a week.   Multiple thyroid nodules - dominant nodule left lobe 1.8  cm    Orders: No orders of the defined types were placed in this encounter.  Meds ordered this encounter  Medications  . gabapentin (NEURONTIN) 300 MG capsule    Sig: Take 1 capsule (300 mg total) by mouth at bedtime.    Dispense:  30 capsule    Refill:  1    Face-to-face time spent with patient was 30 minutes. Greater than 50% of time was spent in counseling and coordination of care.  Follow-Up Instructions: Return in about 3 months (around 12/04/2017) for Osteoarthritis, positive ANA, Fibromyalgia, DDD.   Bo Merino, MD  Note - This record has been created using Editor, commissioning.  Chart creation errors have been sought, but may not always  have been located. Such creation errors do not reflect on  the standard of medical care.

## 2017-08-29 ENCOUNTER — Encounter: Payer: Self-pay | Admitting: Cardiovascular Disease

## 2017-08-29 ENCOUNTER — Telehealth: Payer: Self-pay | Admitting: Cardiovascular Disease

## 2017-08-29 ENCOUNTER — Other Ambulatory Visit: Payer: Self-pay

## 2017-08-29 ENCOUNTER — Ambulatory Visit: Payer: BLUE CROSS/BLUE SHIELD | Admitting: Cardiovascular Disease

## 2017-08-29 VITALS — BP 120/82 | HR 76 | Ht 69.0 in | Wt 313.0 lb

## 2017-08-29 DIAGNOSIS — R079 Chest pain, unspecified: Secondary | ICD-10-CM

## 2017-08-29 NOTE — Progress Notes (Signed)
CARDIOLOGY CONSULT NOTE  Patient ID: [name] MRN: [id number] DOB/AGE: [birth year] 47 y.o.  Admit date: (Not on file) Primary Physician: Sharilyn Sites, MD Referring Physician: Dr. Hilma Favors  Reason for Consultation: Chest pain  HPI: [name] is a 47 y.o. female who is being seen today for the evaluation of chest pain at the request of Sharilyn Sites, MD.   ECG performed today which I personally reviewed demonstrates sinus rhythm with mild nonspecific T wave abnormalities.  She tells me she has been having gastrointestinal problems for the past year but she began vomiting on a daily basis in the last 6 months.  She then developed sharp chest pains lasting seconds which occurred at rest in December.  She has not had any recently.  She tells me she has been diagnosed with unspecified autoimmune disease.  She said her ESR was elevated and rheumatoid factor were positive.  She is scheduled to see a rheumatologist next week.  She denies exertional chest pain.  She has baseline chronic exertional dyspnea which she thinks is likely due to obesity.  She said she underwent a CT of her abdomen recently which demonstrated some cysts on her liver, kidneys, and ovaries.  She has mild bilateral ankle swelling but denies orthopnea and paroxysmal nocturnal dyspnea.  She denies palpitations.  Family history: Father died of an MI at the age of 80.  He was a smoker.  Her mother has COPD and is a smoker.    Allergies  Allergen Reactions  . Seroquel [Quetiapine Fumarate] Nausea And Vomiting    Nightmares and vomiting   . Adhesive [Tape] Dermatitis    Band- Aid adhesive  . Aleve [Naproxen Sodium]     Makes hands, legs, and feet swell  . Carbamazepine     Hallucinations and dizziness  . Mold Extract [Trichophyton]     Current Outpatient Medications  Medication Sig Dispense Refill  . buPROPion (WELLBUTRIN XL) 300 MG 24 hr tablet Take 300 mg by mouth daily.    . cetirizine  (ZYRTEC) 10 MG tablet Take 1 tablet (10 mg total) by mouth daily. 30 tablet 3  . cholecalciferol (VITAMIN D) 1000 units tablet Take 2,000 Units by mouth daily.    Marland Kitchen loperamide (IMODIUM) 2 MG capsule Take by mouth as needed for diarrhea or loose stools.    . Multiple Vitamins-Minerals (MULTIVITAMIN ADULT PO) Take by mouth.    . ondansetron (ZOFRAN) 4 MG tablet Take 1 tablet (4 mg total) by mouth every 6 (six) hours as needed for nausea or vomiting. 120 tablet 1  . pantoprazole (PROTONIX) 40 MG tablet TAKE 1 TABLET(40 MG) BY MOUTH TWICE DAILY 180 tablet 0  . Probiotic Product (ACIDOPHILUS/BIFIDUS PO) Take 4 each by mouth daily.    . Vitamin D, Ergocalciferol, (DRISDOL) 50000 units CAPS capsule TAKE 1 CAPSULE BY MOUTH EVERY 7 DAYS- THEN TAKE OTC VITAMIN DAILY 2000 IU DAILY 4 capsule 0  . PEG-KCl-NaCl-NaSulf-Na Asc-C (PLENVU) 140 g SOLR Take 1 kit by mouth as directed. (Patient not taking: Reported on 08/29/2017) 1 each 0   No current facility-administered medications for this visit.     Past Medical History:  Diagnosis Date  . Abdominal pain 03/18/2016  . Allergy   . Anemia    in the past  . Anxiety   . Anxiety disorder   . Bipolar depression ([place]) 05/29/2016  . Breast lesion 12/06/2015  . Chicken pox   . Complication of anesthesia 1999  urinary retention  . Depression    in teh past- ok now  . Diarrhea 03/18/2016  . Fibromyalgia   . GERD (gastroesophageal reflux disease)   . Hyperglycemia 03/18/2016  . Hyperlipidemia 08/07/2015  . Left thyroid nodule 03/31/2013  . Melanoma ([place])    right hip and knee  . Migraine   . Neck pain on right side 12/23/2012  . Obesity   . Pain 03/31/2013  . Palpitations 08/10/2014  . Shortness of breath    when anemic  . Sinusitis 08/07/2015  . Skin lesion of breast 12/06/2015  . TIA (transient ischemic attack) 03/31/2013  . UTI (lower urinary tract infection)     Past Surgical History:  Procedure Laterality Date  . [place] STUDY N/A 04/12/2017    Procedure: [place];  Surgeon: Mauri Pole, MD;  Location: [facility];  Service: Endoscopy;  Laterality: N/A;  . ABDOMINAL HYSTERECTOMY    . BACK SURGERY    . BILATERAL SALPINGECTOMY Bilateral 09/13/2012   Procedure: BILATERAL SALPINGECTOMY;  Surgeon: Lavonia Drafts, MD;  Location: [facility];  Service: Gynecology;  Laterality: Bilateral;  . CHOLECYSTECTOMY    . COLONOSCOPY    . ESOPHAGEAL MANOMETRY N/A 04/12/2017   Procedure: ESOPHAGEAL MANOMETRY (EM);  Surgeon: Mauri Pole, MD;  Location: [facility];  Service: Endoscopy;  Laterality: N/A;  . LAPAROSCOPIC ASSISTED VAGINAL HYSTERECTOMY N/A 09/13/2012   Procedure: LAPAROSCOPIC ASSISTED VAGINAL HYSTERECTOMY;  Surgeon: Lavonia Drafts, MD;  Location: [facility];  Service: Gynecology;  Laterality: N/A;  . MELANOMA EXCISION  2011   stage I and II  . WISDOM TOOTH EXTRACTION  1989    Social History   Socioeconomic History  . Marital status: Married    Spouse name: Not on file  . Number of children: Not on file  . Years of education: Not on file  . Highest education level: Not on file  Social Needs  . Financial resource strain: Not on file  . Food insecurity - worry: Not on file  . Food insecurity - inability: Not on file  . Transportation needs - medical: Not on file  . Transportation needs - non-medical: Not on file  Occupational History  . Occupation: unemployed  Tobacco Use  . Smoking status: Never Smoker  . Smokeless tobacco: Never Used  . Tobacco comment: never used tobacco  Substance and Sexual Activity  . Alcohol use: No    Alcohol/week: 0.0 oz  . Drug use: No  . Sexual activity: Yes    Birth control/protection: None  Other Topics Concern  . Not on file  Social History Narrative   Daily caffeine      Current Meds  Medication Sig  . buPROPion (WELLBUTRIN XL) 300 MG 24 hr tablet Take 300 mg by mouth daily.  . cetirizine (ZYRTEC) 10 MG tablet Take 1 tablet (10 mg total)  by mouth daily.  . cholecalciferol (VITAMIN D) 1000 units tablet Take 2,000 Units by mouth daily.  Marland Kitchen loperamide (IMODIUM) 2 MG capsule Take by mouth as needed for diarrhea or loose stools.  . Multiple Vitamins-Minerals (MULTIVITAMIN ADULT PO) Take by mouth.  . ondansetron (ZOFRAN) 4 MG tablet Take 1 tablet (4 mg total) by mouth every 6 (six) hours as needed for nausea or vomiting.  . pantoprazole (PROTONIX) 40 MG tablet TAKE 1 TABLET(40 MG) BY MOUTH TWICE DAILY  . Probiotic Product (ACIDOPHILUS/BIFIDUS PO) Take 4 each by mouth daily.  . Vitamin D, Ergocalciferol, (DRISDOL) 50000 units CAPS capsule TAKE  1 CAPSULE BY MOUTH EVERY 7 DAYS- THEN TAKE OTC VITAMIN DAILY 2000 IU DAILY  . [DISCONTINUED] omeprazole (PRILOSEC) 40 MG capsule Take 1 capsule (40 mg total) by mouth daily.      Review of systems complete and found to be negative unless listed above in HPI    Physical exam Blood pressure 120/82, pulse 76, height '5\' 9"'$  (1.753 m), weight (!) 313 lb (142 kg), last menstrual period 08/13/2012, SpO2 98 %. General: NAD Neck: No JVD, no thyromegaly or thyroid nodule.  Lungs: Clear to auscultation bilaterally with normal respiratory effort. CV: Nondisplaced PMI. Regular rate and rhythm, normal S1/S2, no S3/S4, no murmur.  No peripheral edema.  No carotid bruit.    Abdomen: Soft, mild epigastric and left upper quadrant tenderness, no rebound, guarding, or rigidity, no distention.  Skin: Intact without lesions or rashes.  Neurologic: Alert and oriented x 3.  Psych: Normal affect. Extremities: No clubbing or cyanosis.  HEENT: Normal.   ECG: Most recent ECG reviewed.   Labs: Lab Results  Component Value Date/Time   K 3.9 03/21/2017 11:56 AM   K 4.4 08/24/2012 09:17 AM   BUN 19 03/21/2017 11:56 AM   BUN 13.5 08/24/2012 09:17 AM   CREATININE 1.08 (H) 03/21/2017 11:56 AM   CREATININE 0.71 09/26/2013 03:24 PM   CREATININE 0.8 08/24/2012 09:17 AM   ALT 38 03/21/2017 11:56 AM   TSH 2.54  07/05/2016 10:25 AM   HGB 14.4 03/21/2017 11:56 AM   HGB 14.1 10/27/2016 03:19 PM   HGB 14.5 08/10/2015 08:17 AM   HGB 15.1 08/24/2012 09:17 AM     Lipids: Lab Results  Component Value Date/Time   LDLCALC 114 (H) 09/26/2013 03:24 PM   LDLDIRECT 146.0 07/05/2016 10:25 AM   CHOL 210 (H) 07/05/2016 10:25 AM   TRIG 204.0 (H) 07/05/2016 10:25 AM   HDL 41.30 07/05/2016 10:25 AM        ASSESSMENT AND PLAN:  1.  Chest pain: Symptoms are very atypical for ischemic heart disease.  She does have a family history of premature coronary artery disease in her father who died suddenly of an MI at the age of 66, but he was a smoker and she has never smoked.  She denies exertional symptoms.  I suspect her problems were more related to esophageal inflammation due to repeated vomiting.  She has not had any recent chest pains nor vomiting.  I will obtain an echocardiogram to evaluate cardiac structure and function.     Disposition: Follow up to be determined  Signed: Kate Sable, M.D., F.A.C.C.  08/29/2017, 9:22 AM

## 2017-08-29 NOTE — Telephone Encounter (Signed)
Pre-cert Verification for the following procedure   °Echo scheduled for 09/06/17  °

## 2017-08-29 NOTE — Patient Instructions (Signed)
Medication Instructions:  Continue all current medications.  Labwork: none  Testing/Procedures:  Your physician has requested that you have an echocardiogram. Echocardiography is a painless test that uses sound waves to create images of your heart. It provides your doctor with information about the size and shape of your heart and how well your heart's chambers and valves are working. This procedure takes approximately one hour. There are no restrictions for this procedure.  Office will contact with results via phone or letter.    Follow-Up: To be determined   Any Other Special Instructions Will Be Listed Below (If Applicable).  If you need a refill on your cardiac medications before your next appointment, please call your pharmacy.

## 2017-08-31 ENCOUNTER — Encounter: Payer: Self-pay | Admitting: Internal Medicine

## 2017-08-31 ENCOUNTER — Other Ambulatory Visit: Payer: Self-pay | Admitting: Family Medicine

## 2017-09-06 ENCOUNTER — Other Ambulatory Visit: Payer: Self-pay

## 2017-09-06 ENCOUNTER — Encounter: Payer: Self-pay | Admitting: Physician Assistant

## 2017-09-06 ENCOUNTER — Ambulatory Visit: Payer: BLUE CROSS/BLUE SHIELD | Admitting: Physician Assistant

## 2017-09-06 VITALS — BP 132/84 | HR 75 | Resp 17 | Ht 69.0 in | Wt 312.0 lb

## 2017-09-06 DIAGNOSIS — M5030 Other cervical disc degeneration, unspecified cervical region: Secondary | ICD-10-CM | POA: Diagnosis not present

## 2017-09-06 DIAGNOSIS — E042 Nontoxic multinodular goiter: Secondary | ICD-10-CM | POA: Diagnosis not present

## 2017-09-06 DIAGNOSIS — E559 Vitamin D deficiency, unspecified: Secondary | ICD-10-CM

## 2017-09-06 DIAGNOSIS — M1712 Unilateral primary osteoarthritis, left knee: Secondary | ICD-10-CM

## 2017-09-06 DIAGNOSIS — M19041 Primary osteoarthritis, right hand: Secondary | ICD-10-CM

## 2017-09-06 DIAGNOSIS — M5136 Other intervertebral disc degeneration, lumbar region: Secondary | ICD-10-CM | POA: Diagnosis not present

## 2017-09-06 DIAGNOSIS — M19042 Primary osteoarthritis, left hand: Secondary | ICD-10-CM

## 2017-09-06 DIAGNOSIS — R768 Other specified abnormal immunological findings in serum: Secondary | ICD-10-CM | POA: Diagnosis not present

## 2017-09-06 DIAGNOSIS — M797 Fibromyalgia: Secondary | ICD-10-CM | POA: Diagnosis not present

## 2017-09-06 MED ORDER — GABAPENTIN 300 MG PO CAPS: 300.0000 mg | ORAL_CAPSULE | Freq: Every day | ORAL | 1 refills | Status: DC

## 2017-09-06 NOTE — Patient Instructions (Signed)
Natural anti-inflammatories  You can purchase these at [employer], [employer] or online.  . Turmeric (capsules)  . Ginger (ginger root or capsules)  . Omega 3 (Fish, flax seeds, chia seeds, walnuts, almonds)  . Tart cherry (dried or extract)   Patient should be under the care of a physician while taking these supplements. This may not be reproduced without the permission of Dr. Quantel Mcinturff.  

## 2017-09-14 ENCOUNTER — Encounter: Payer: Self-pay | Admitting: Internal Medicine

## 2017-09-14 ENCOUNTER — Ambulatory Visit (AMBULATORY_SURGERY_CENTER): Payer: BLUE CROSS/BLUE SHIELD | Admitting: Internal Medicine

## 2017-09-14 VITALS — BP 119/73 | HR 80 | Temp 97.8°F | Resp 13 | Ht 69.0 in | Wt 313.0 lb

## 2017-09-14 DIAGNOSIS — R1013 Epigastric pain: Secondary | ICD-10-CM | POA: Diagnosis not present

## 2017-09-14 DIAGNOSIS — K219 Gastro-esophageal reflux disease without esophagitis: Secondary | ICD-10-CM

## 2017-09-14 DIAGNOSIS — R197 Diarrhea, unspecified: Secondary | ICD-10-CM

## 2017-09-14 MED ORDER — SODIUM CHLORIDE 0.9 % IV SOLN: 500.0000 mL | Freq: Once | INTRAVENOUS | Status: DC

## 2017-09-14 NOTE — Progress Notes (Signed)
Called to room to assist during endoscopic procedure.  Patient ID and intended procedure confirmed with present staff. Received instructions for my participation in the procedure from the performing physician.  

## 2017-09-14 NOTE — Patient Instructions (Signed)
**   Handout given on GERD & Hiatal Hernia **  YOU HAD AN ENDOSCOPIC PROCEDURE TODAY AT [facility]:   Refer to the procedure report that was given to you for any specific questions about what was found during the examination.  If the procedure report does not answer your questions, please call your gastroenterologist to clarify.  If you requested that your care partner not be given the details of your procedure findings, then the procedure report has been included in a sealed envelope for you to review at your convenience later.  YOU SHOULD EXPECT: Some feelings of bloating in the abdomen. Passage of more gas than usual.  Walking can help get rid of the air that was put into your GI tract during the procedure and reduce the bloating. If you had a lower endoscopy (such as a colonoscopy or flexible sigmoidoscopy) you may notice spotting of blood in your stool or on the toilet paper. If you underwent a bowel prep for your procedure, you may not have a normal bowel movement for a few days.  Please Note:  You might notice some irritation and congestion in your nose or some drainage.  This is from the oxygen used during your procedure.  There is no need for concern and it should clear up in a day or so.  SYMPTOMS TO REPORT IMMEDIATELY:   Following lower endoscopy (colonoscopy or flexible sigmoidoscopy):  Excessive amounts of blood in the stool  Significant tenderness or worsening of abdominal pains  Swelling of the abdomen that is new, acute  Fever of 100F or higher   Following upper endoscopy (EGD)  Vomiting of blood or coffee ground material  New chest pain or pain under the shoulder blades  Painful or persistently difficult swallowing  New shortness of breath  Fever of 100F or higher  Black, tarry-looking stools  For urgent or emergent issues, a gastroenterologist can be reached at any hour by calling [phone].   DIET:  We do recommend a small meal at first, but then  you may proceed to your regular diet.  Drink plenty of fluids but you should avoid alcoholic beverages for 24 hours.  ACTIVITY:  You should plan to take it easy for the rest of today and you should NOT DRIVE or use heavy machinery until tomorrow (because of the sedation medicines used during the test).    FOLLOW UP: Our staff will call the number listed on your records the next business day following your procedure to check on you and address any questions or concerns that you may have regarding the information given to you following your procedure. If we do not reach you, we will leave a message.  However, if you are feeling well and you are not experiencing any problems, there is no need to return our call.  We will assume that you have returned to your regular daily activities without incident.  If any biopsies were taken you will be contacted by phone or by letter within the next 1-3 weeks.  Please call us at [phone] if you have not heard about the biopsies in 3 weeks.    SIGNATURES/CONFIDENTIALITY: You and/or your care partner have signed paperwork which will be entered into your electronic medical record.  These signatures attest to the fact that that the information above on your After Visit Summary has been reviewed and is understood.  Full responsibility of the confidentiality of this discharge information lies with you and/or your care-partner.

## 2017-09-14 NOTE — Progress Notes (Signed)
Pt's states no medical or surgical changes since previsit or office visit. 

## 2017-09-14 NOTE — Progress Notes (Signed)
A and O x3. Report to RN. Tolerated MAC anesthesia well.Teeth unchanged after procedure.

## 2017-09-14 NOTE — Op Note (Signed)
[facility] Patient Name: [name] Procedure Date: 09/14/2017 3:12 PM MRN: [id number] Endoscopist: Docia Chuck. Henrene Pastor , MD Age: 47 Referring MD:  Date of Birth: [birth year] Gender: Female Account #: [id number] Procedure:                Colonoscopy, with biopsies Indications:              Abdominal pain, Chronic diarrhea Medicines:                Monitored Anesthesia Care Procedure:                Pre-Anesthesia Assessment:                           - Prior to the procedure, a History and Physical                            was performed, and patient medications and                            allergies were reviewed. The patient's tolerance of                            previous anesthesia was also reviewed. The risks                            and benefits of the procedure and the sedation                            options and risks were discussed with the patient.                            All questions were answered, and informed consent                            was obtained. Prior Anticoagulants: The patient has                            taken no previous anticoagulant or antiplatelet                            agents. ASA Grade Assessment: II - A patient with                            mild systemic disease. After reviewing the risks                            and benefits, the patient was deemed in                            satisfactory condition to undergo the procedure.                           After obtaining informed consent, the colonoscope  was passed under direct vision. Throughout the                            procedure, the patient's blood pressure, pulse, and                            oxygen saturations were monitored continuously. The                            Model CF-HQ190L [id number]) scope was introduced                            through the anus and advanced to the the cecum,                            identified  by appendiceal orifice and ileocecal                            valve. The ileocecal valve, appendiceal orifice,                            and rectum were photographed. The quality of the                            bowel preparation was excellent. The colonoscopy                            was performed without difficulty. The patient                            tolerated the procedure well. The bowel preparation                            used was SUPREP. Scope In: 3:15:14 PM Scope Out: 3:28:27 PM Scope Withdrawal Time: 0 hours 11 minutes 16 seconds  Total Procedure Duration: 0 hours 13 minutes 13 seconds  Findings:                 Non-bleeding internal hemorrhoids were found during                            retroflexion. The hemorrhoids were small.                           The entire examined colon appeared normal on direct                            and retroflexion views. Biopsies for histology were                            taken with a cold forceps from the entire colon for                            evaluation of microscopic colitis. Complications:  No immediate complications. Estimated blood loss:                            None. Estimated Blood Loss:     Estimated blood loss: none. Impression:               - Non-bleeding internal hemorrhoids.                           - The entire examined colon is normal on direct and                            retroflexion views. Recommendation:           - Repeat colonoscopy in 10 years for screening                            purposes.                           - Patient has a contact number available for                            emergencies. The signs and symptoms of potential                            delayed complications were discussed with the                            patient. Return to normal activities tomorrow.                            Written discharge instructions were provided to the                             patient.                           - Resume previous diet.                           - Continue present medications.                           - Await pathology results. We will send letter with                            the results and further recommendations if needed                           - EGD today. Please see report. Docia Chuck. Henrene Pastor, MD 09/14/2017 3:32:41 PM This report has been signed electronically.

## 2017-09-14 NOTE — Op Note (Signed)
[facility] Patient Name: Jeanifer Halliday Procedure Date: 09/14/2017 3:12 PM MRN: [id number] Endoscopist: Docia Chuck. Henrene Pastor , MD Age: 47 Referring MD:  Date of Birth: Oct 26, 1970 Gender: Female Account #: [id number] Procedure:                Upper GI endoscopy Indications:              Epigastric abdominal pain, Nausea with vomiting Medicines:                Monitored Anesthesia Care Procedure:                Pre-Anesthesia Assessment:                           - Prior to the procedure, a History and Physical                            was performed, and patient medications and                            allergies were reviewed. The patient's tolerance of                            previous anesthesia was also reviewed. The risks                            and benefits of the procedure and the sedation                            options and risks were discussed with the patient.                            All questions were answered, and informed consent                            was obtained. Prior Anticoagulants: The patient has                            taken no previous anticoagulant or antiplatelet                            agents. After reviewing the risks and benefits, the                            patient was deemed in satisfactory condition to                            undergo the procedure.                           After obtaining informed consent, the endoscope was                            passed under direct vision. Throughout the  procedure, the patient's blood pressure, pulse, and                            oxygen saturations were monitored continuously. The                            Endoscope was introduced through the mouth, and                            advanced to the second part of duodenum. The upper                            GI endoscopy was accomplished without difficulty.                            The patient tolerated  the procedure well. Scope In: Scope Out: Findings:                 The esophagus was normal, save large distal ring.                           The stomach was normal, save a moderate-sized                            hiatal hernia.                           The examined duodenum was normal.                           The cardia and gastric fundus were normal on                            retroflexion. Complications:            No immediate complications. Estimated Blood Loss:     Estimated blood loss: none. Impression:               1. GERD Recommendation:           1. Reflux precautions                           2. Significant weight loss                           3. Continue antireflux medications                           4. Office follow-up with Dr. Henrene Pastor in about 6 weeks. Docia Chuck. Henrene Pastor, MD 09/14/2017 3:41:23 PM This report has been signed electronically.

## 2017-09-15 ENCOUNTER — Telehealth: Payer: Self-pay | Admitting: *Deleted

## 2017-09-15 NOTE — Telephone Encounter (Signed)
  Follow up Call-  Call back number 09/14/2017 08/12/2015  Post procedure Call Back phone  # [phone]  Permission to leave phone message Yes Yes  Some recent data might be hidden     Patient questions:  Do you have a fever, pain , or abdominal swelling? No. Pain Score  0 *  Have you tolerated food without any problems? Yes.    Have you been able to return to your normal activities?yes  Do you have any questions about your discharge instructions: Diet   No. Medications  No. Follow up visit  No.  Do you have questions or concerns about your Care? No.  Actions: * If pain score is 4 or above: No action needed, pain <4.

## 2017-09-20 ENCOUNTER — Other Ambulatory Visit: Payer: Self-pay

## 2017-09-25 ENCOUNTER — Encounter: Payer: Self-pay | Admitting: Internal Medicine

## 2017-09-27 ENCOUNTER — Ambulatory Visit (INDEPENDENT_AMBULATORY_CARE_PROVIDER_SITE_OTHER): Payer: Self-pay

## 2017-09-27 ENCOUNTER — Ambulatory Visit (INDEPENDENT_AMBULATORY_CARE_PROVIDER_SITE_OTHER): Payer: BLUE CROSS/BLUE SHIELD | Admitting: Rheumatology

## 2017-09-27 DIAGNOSIS — M79642 Pain in left hand: Secondary | ICD-10-CM | POA: Diagnosis not present

## 2017-09-27 DIAGNOSIS — M79641 Pain in right hand: Secondary | ICD-10-CM | POA: Diagnosis not present

## 2017-09-27 NOTE — Progress Notes (Signed)
Ultrasound examination of bilateral hands was performed per EULAR recommendations. Using 12 MHz transducer, grayscale and power Doppler bilateral second, third, and fifth MCP joints and bilateral wrist joints both dorsal and volar aspects were evaluated to look for synovitis or tenosynovitis. The findings were there was no synovitis or tenosynovitis on ultrasound examination. Right median nerve was 0.11 cm squares which was within normal limits and left median nerve was 0.13 cm squares which was more than upper limits of normal.  Impression: Ultrasound examination was negative for inflammatory arthritis.  Left median nerve was enlarged but she had no symptoms of carpal tunnel syndrome. Bo Merino, MD

## 2017-10-04 ENCOUNTER — Other Ambulatory Visit: Payer: BLUE CROSS/BLUE SHIELD | Admitting: Rheumatology

## 2017-10-25 ENCOUNTER — Ambulatory Visit: Payer: Self-pay | Admitting: Internal Medicine

## 2017-11-13 ENCOUNTER — Other Ambulatory Visit: Payer: Self-pay | Admitting: Family Medicine

## 2017-11-20 NOTE — Progress Notes (Unsigned)
Office Visit Note  Patient: [name]             Date of Birth: 1970/12/10           MRN: [id number]             PCP: Sharilyn Sites, MD Referring: Sharilyn Sites, MD Visit Date: 12/04/2017 Occupation: @GUAROCC @    Subjective:  No chief complaint on file.   History of Present Illness: [name] is a 47 y.o. female ***   Activities of Daily Living:  Patient reports morning stiffness for *** {minute/hour:19697}.   Patient {ACTIONS;DENIES/REPORTS:21021675::"Denies"} nocturnal pain.  Difficulty dressing/grooming: {ACTIONS;DENIES/REPORTS:21021675::"Denies"} Difficulty climbing stairs: {ACTIONS;DENIES/REPORTS:21021675::"Denies"} Difficulty getting out of chair: {ACTIONS;DENIES/REPORTS:21021675::"Denies"} Difficulty using hands for taps, buttons, cutlery, and/or writing: {ACTIONS;DENIES/REPORTS:21021675::"Denies"}   No Rheumatology ROS completed.   PMFS History:  Patient Active Problem List   Diagnosis Date Noted  . Hematemesis without nausea 08/17/2017  . Abdominal pain, epigastric 08/17/2017  . Regurgitation of food   . 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 Vasomotor symptoms due to menopause 11/23/2016  . History of melanoma 10/28/2016  . DJD (degenerative joint disease), cervical 10/25/2016  . 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 Primary osteoarthritis of both hands 10/25/2016  . Primary osteoarthritis of left knee 10/25/2016  . Fibromyalgia 10/25/2016  . Allergic rhinitis with a probable non-allergic component 10/24/2016  . ANA positive 10/21/2016  . Elevated sed rate 05/29/2016  . Bipolar depression ([place]) 05/29/2016  . Fever 05/23/2016  . Other complicated headache syndrome 05/23/2016  . Rash and nonspecific skin eruption 05/23/2016  . Hyperglycemia 03/18/2016  . Diarrhea 03/18/2016  . Abdominal pain 03/18/2016  . Perimenopausal 01/31/2016  . Vitamin D deficiency 01/31/2016  . Myalgia 01/31/2016  . Skin lesion of breast 12/06/2015  . Nausea and  vomiting 08/12/2015  . Dysphagia 08/12/2015  . Peptic stricture of esophagus 08/12/2015  . Hyperlipidemia 08/07/2015  . Left foot pain 08/07/2015  . Recurrent sinusitis 08/07/2015  . Back pain 08/19/2014  . Palpitations 08/10/2014  . Pain 03/31/2013  . Neck pain on right side 12/23/2012  . Fibroids 07/04/2012  . Menorrhagia 06/25/2012  . Hypertriglyceridemia 04/23/2012  . Annual physical exam 12/15/2011  . Obesity 12/10/2010  . Lower extremity edema 12/10/2010  . Iron deficiency anemia 08/18/2009  . GERD 08/18/2009    Past Medical History:  Diagnosis Date  . Abdominal pain 03/18/2016  . Allergy   . Anemia    in the past  . Anxiety   . Anxiety disorder   . Bipolar depression ([place]) 05/29/2016  . Breast lesion 12/06/2015  . Chicken pox   . Complication of anesthesia 1999   urinary retention  . Depression    in teh past- ok now  . Diarrhea 03/18/2016  . Fibromyalgia   . GERD (gastroesophageal reflux disease)   . Hyperglycemia 03/18/2016  . Hyperlipidemia 08/07/2015  . Left thyroid nodule 03/31/2013  . Melanoma ([place])    right hip and knee  . Migraine   . Neck pain on right side 12/23/2012  . Obesity   . Pain 03/31/2013  . Palpitations 08/10/2014  . Shortness of breath    when anemic  . Sinusitis 08/07/2015  . Skin lesion of breast 12/06/2015  . TIA (transient ischemic attack) 03/31/2013  . UTI (lower urinary tract infection)     Family History  Problem Relation Age of Onset  . Colitis Father        crohn's  . Heart disease Father  grandfather  . Heart attack Father 59  . Arthritis Father        s/p back surgery  . Other Mother        neurological autoimmune disease, chorea in trunk  . Eczema Mother   . Asthma Mother   . Arthritis Maternal Grandmother   . Lupus Maternal Grandmother   . Dementia Maternal Grandmother   . CVA Maternal Grandmother   . Arthritis Paternal Grandmother   . Heart disease Paternal Grandmother        chf  . Asthma Paternal Grandmother     . Diabetes Paternal Grandfather   . Kidney disease Paternal Grandfather   . Hypertension Paternal Grandfather   . Hyperlipidemia Paternal Grandfather   . Cancer Maternal Grandfather        bone  . Diabetes Unknown        grandfather  . Irritable bowel syndrome Unknown   . Kidney disease Unknown        grandfather  . Arthritis Other   . Cancer Other        breast  . Hyperlipidemia Other   . Mental illness Other   . Emphysema Maternal Uncle   . Colon cancer Neg Hx   . Allergic rhinitis Neg Hx   . Angioedema Neg Hx   . Immunodeficiency Neg Hx   . Urticaria Neg Hx    Past Surgical History:  Procedure Laterality Date  . [place] STUDY N/A 04/12/2017   Procedure: [place];  Surgeon: Mauri Pole, MD;  Location: [facility];  Service: Endoscopy;  Laterality: N/A;  . ABDOMINAL HYSTERECTOMY    . BACK SURGERY    . BILATERAL SALPINGECTOMY Bilateral 09/13/2012   Procedure: BILATERAL SALPINGECTOMY;  Surgeon: Lavonia Drafts, MD;  Location: [facility];  Service: Gynecology;  Laterality: Bilateral;  . CHOLECYSTECTOMY    . COLONOSCOPY    . ESOPHAGEAL MANOMETRY N/A 04/12/2017   Procedure: ESOPHAGEAL MANOMETRY (EM);  Surgeon: Mauri Pole, MD;  Location: [facility];  Service: Endoscopy;  Laterality: N/A;  . LAPAROSCOPIC ASSISTED VAGINAL HYSTERECTOMY N/A 09/13/2012   Procedure: LAPAROSCOPIC ASSISTED VAGINAL HYSTERECTOMY;  Surgeon: Lavonia Drafts, MD;  Location: [facility];  Service: Gynecology;  Laterality: N/A;  . MELANOMA EXCISION  2011   stage I and II  . WISDOM TOOTH EXTRACTION  1989   Social History   Social History Narrative   Daily caffeine     Objective: Vital Signs: LMP 08/13/2012    Physical Exam   Musculoskeletal Exam: ***  CDAI Exam: No CDAI exam completed.    Investigation: No additional findings. CBC Latest Ref Rng & Units 03/21/2017 10/27/2016 07/05/2016  WBC 4.0 - 10.5 K/uL 9.9 8.6 9.4  Hemoglobin 12.0 - 15.0  g/dL 14.4 14.1 14.2  Hematocrit 36.0 - 46.0 % 43.6 41.8 42.0  Platelets 150 - 400 K/uL 280 322 279.0   CMP Latest Ref Rng & Units 03/21/2017 07/05/2016 05/23/2016  Glucose 65 - 99 mg/dL 97 99 73  BUN 6 - 20 mg/dL 19 15 14   Creatinine 0.44 - 1.00 mg/dL 1.08(H) 0.80 0.69  Sodium 135 - 145 mmol/L 138 138 137  Potassium 3.5 - 5.1 mmol/L 3.9 4.2 4.1  Chloride 101 - 111 mmol/L 101 101 103  CO2 22 - 32 mmol/L 26 27 25   Calcium 8.9 - 10.3 mg/dL 9.9 9.2 9.3  Total Protein 6.5 - 8.1 g/dL 7.8 7.0 7.2  Total Bilirubin 0.3 - 1.2 mg/dL 0.3 0.5 0.4  Alkaline  Phos 38 - 126 U/L 94 84 80  AST 15 - 41 U/L 29 26 20   ALT 14 - 54 U/L 38 44(H) 27    Imaging: No results found.  Speciality Comments: No specialty comments available.    Procedures:  No procedures performed Allergies: Seroquel [quetiapine fumarate]; Adhesive [tape]; Aleve [naproxen sodium]; Carbamazepine; and Mold extract [trichophyton]   Assessment / Plan:     Visit Diagnoses: Positive ANA (antinuclear antibody)  Primary osteoarthritis of both hands  Primary osteoarthritis of left knee  Fibromyalgia  DDD (degenerative disc disease), cervical  DDD (degenerative disc disease), lumbar  Vitamin D deficiency  Multiple thyroid nodules  Peptic stricture of esophagus  Lower extremity edema  Bipolar depression (HCC)  History of gastroesophageal reflux (GERD)  History of hyperlipidemia    Orders: No orders of the defined types were placed in this encounter.  No orders of the defined types were placed in this encounter.   Face-to-face time spent with patient was *** minutes. 50% of time was spent in counseling and coordination of care.  Follow-Up Instructions: No follow-ups on file.   Ofilia Neas, PA-C  Note - This record has been created using Dragon software.  Chart creation errors have been sought, but may not always  have been located. Such creation errors do not reflect on  the standard of medical care.

## 2017-11-30 DIAGNOSIS — R51 Headache: Secondary | ICD-10-CM | POA: Diagnosis not present

## 2017-11-30 DIAGNOSIS — Z1389 Encounter for screening for other disorder: Secondary | ICD-10-CM | POA: Diagnosis not present

## 2017-11-30 DIAGNOSIS — E559 Vitamin D deficiency, unspecified: Secondary | ICD-10-CM | POA: Diagnosis not present

## 2017-11-30 DIAGNOSIS — Z6841 Body Mass Index (BMI) 40.0 and over, adult: Secondary | ICD-10-CM | POA: Diagnosis not present

## 2017-12-04 ENCOUNTER — Ambulatory Visit: Payer: BLUE CROSS/BLUE SHIELD | Admitting: Physician Assistant

## 2017-12-26 NOTE — Progress Notes (Deleted)
Office Visit Note  Patient: [name]             Date of Birth: 1971-03-06           MRN: [id number]             PCP: Sharilyn Sites, MD Referring: Sharilyn Sites, MD Visit Date: 01/09/2018 Occupation: @GUAROCC @    Subjective:  No chief complaint on file.   History of Present Illness: [name] is a 47 y.o. female ***   Activities of Daily Living:  Patient reports morning stiffness for *** {minute/hour:19697}.   Patient {ACTIONS;DENIES/REPORTS:21021675::"Denies"} nocturnal pain.  Difficulty dressing/grooming: {ACTIONS;DENIES/REPORTS:21021675::"Denies"} Difficulty climbing stairs: {ACTIONS;DENIES/REPORTS:21021675::"Denies"} Difficulty getting out of chair: {ACTIONS;DENIES/REPORTS:21021675::"Denies"} Difficulty using hands for taps, buttons, cutlery, and/or writing: {ACTIONS;DENIES/REPORTS:21021675::"Denies"}   No Rheumatology ROS completed.   PMFS History:  Patient Active Problem List   Diagnosis Date Noted  . Hematemesis without nausea 08/17/2017  . Abdominal pain, epigastric 08/17/2017  . Regurgitation of food   . 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 Vasomotor symptoms due to menopause 11/23/2016  . History of melanoma 10/28/2016  . DJD (degenerative joint disease), cervical 10/25/2016  . 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 Primary osteoarthritis of both hands 10/25/2016  . Primary osteoarthritis of left knee 10/25/2016  . Fibromyalgia 10/25/2016  . Allergic rhinitis with a probable non-allergic component 10/24/2016  . ANA positive 10/21/2016  . Elevated sed rate 05/29/2016  . Bipolar depression ([place]) 05/29/2016  . Fever 05/23/2016  . Other complicated headache syndrome 05/23/2016  . Rash and nonspecific skin eruption 05/23/2016  . Hyperglycemia 03/18/2016  . Diarrhea 03/18/2016  . Abdominal pain 03/18/2016  . Perimenopausal 01/31/2016  . Vitamin D deficiency 01/31/2016  . Myalgia 01/31/2016  . Skin lesion of breast 12/06/2015  . Nausea and  vomiting 08/12/2015  . Dysphagia 08/12/2015  . Peptic stricture of esophagus 08/12/2015  . Hyperlipidemia 08/07/2015  . Left foot pain 08/07/2015  . Recurrent sinusitis 08/07/2015  . Back pain 08/19/2014  . Palpitations 08/10/2014  . Pain 03/31/2013  . Neck pain on right side 12/23/2012  . Fibroids 07/04/2012  . Menorrhagia 06/25/2012  . Hypertriglyceridemia 04/23/2012  . Annual physical exam 12/15/2011  . Obesity 12/10/2010  . Lower extremity edema 12/10/2010  . Iron deficiency anemia 08/18/2009  . GERD 08/18/2009    Past Medical History:  Diagnosis Date  . Abdominal pain 03/18/2016  . Allergy   . Anemia    in the past  . Anxiety   . Anxiety disorder   . Bipolar depression ([place]) 05/29/2016  . Breast lesion 12/06/2015  . Chicken pox   . Complication of anesthesia 1999   urinary retention  . Depression    in teh past- ok now  . Diarrhea 03/18/2016  . Fibromyalgia   . GERD (gastroesophageal reflux disease)   . Hyperglycemia 03/18/2016  . Hyperlipidemia 08/07/2015  . Left thyroid nodule 03/31/2013  . Melanoma ([place])    right hip and knee  . Migraine   . Neck pain on right side 12/23/2012  . Obesity   . Pain 03/31/2013  . Palpitations 08/10/2014  . Shortness of breath    when anemic  . Sinusitis 08/07/2015  . Skin lesion of breast 12/06/2015  . TIA (transient ischemic attack) 03/31/2013  . UTI (lower urinary tract infection)     Family History  Problem Relation Age of Onset  . Colitis Father        crohn's  . Heart disease Father  grandfather  . Heart attack Father 66  . Arthritis Father        s/p back surgery  . Other Mother        neurological autoimmune disease, chorea in trunk  . Eczema Mother   . Asthma Mother   . Arthritis Maternal Grandmother   . Lupus Maternal Grandmother   . Dementia Maternal Grandmother   . CVA Maternal Grandmother   . Arthritis Paternal Grandmother   . Heart disease Paternal Grandmother        chf  . Asthma Paternal Grandmother     . Diabetes Paternal Grandfather   . Kidney disease Paternal Grandfather   . Hypertension Paternal Grandfather   . Hyperlipidemia Paternal Grandfather   . Cancer Maternal Grandfather        bone  . Diabetes Unknown        grandfather  . Irritable bowel syndrome Unknown   . Kidney disease Unknown        grandfather  . Arthritis Other   . Cancer Other        breast  . Hyperlipidemia Other   . Mental illness Other   . Emphysema Maternal Uncle   . Colon cancer Neg Hx   . Allergic rhinitis Neg Hx   . Angioedema Neg Hx   . Immunodeficiency Neg Hx   . Urticaria Neg Hx    Past Surgical History:  Procedure Laterality Date  . [place] STUDY N/A 04/12/2017   Procedure: [place];  Surgeon: Mauri Pole, MD;  [facility];  Service: Endoscopy;  Laterality: N/A;  . ABDOMINAL HYSTERECTOMY    . BACK SURGERY    . BILATERAL SALPINGECTOMY Bilateral 09/13/2012   Procedure: BILATERAL SALPINGECTOMY;  Surgeon: Lavonia Drafts, MD;  Location: [facility];  Service: Gynecology;  Laterality: Bilateral;  . CHOLECYSTECTOMY    . COLONOSCOPY    . ESOPHAGEAL MANOMETRY N/A 04/12/2017   Procedure: ESOPHAGEAL MANOMETRY (EM);  Surgeon: Mauri Pole, MD;  Location: [facility];  Service: Endoscopy;  Laterality: N/A;  . LAPAROSCOPIC ASSISTED VAGINAL HYSTERECTOMY N/A 09/13/2012   Procedure: LAPAROSCOPIC ASSISTED VAGINAL HYSTERECTOMY;  Surgeon: Lavonia Drafts, MD;  Location: [facility];  Service: Gynecology;  Laterality: N/A;  . MELANOMA EXCISION  2011   stage I and II  . WISDOM TOOTH EXTRACTION  1989   Social History   Social History Narrative   Daily caffeine     Objective: Vital Signs: LMP 08/13/2012    Physical Exam   Musculoskeletal Exam: ***  CDAI Exam: No CDAI exam completed.    Investigation: No additional findings. CBC Latest Ref Rng & Units 03/21/2017 10/27/2016 07/05/2016  WBC 4.0 - 10.5 K/uL 9.9 8.6 9.4  Hemoglobin 12.0 - 15.0  g/dL 14.4 14.1 14.2  Hematocrit 36.0 - 46.0 % 43.6 41.8 42.0  Platelets 150 - 400 K/uL 280 322 279.0   CMP Latest Ref Rng & Units 03/21/2017 07/05/2016 05/23/2016  Glucose 65 - 99 mg/dL 97 99 73  BUN 6 - 20 mg/dL 19 15 14   Creatinine 0.44 - 1.00 mg/dL 1.08(H) 0.80 0.69  Sodium 135 - 145 mmol/L 138 138 137  Potassium 3.5 - 5.1 mmol/L 3.9 4.2 4.1  Chloride 101 - 111 mmol/L 101 101 103  CO2 22 - 32 mmol/L 26 27 25   Calcium 8.9 - 10.3 mg/dL 9.9 9.2 9.3  Total Protein 6.5 - 8.1 g/dL 7.8 7.0 7.2  Total Bilirubin 0.3 - 1.2 mg/dL 0.3 0.5 0.4  Alkaline  Phos 38 - 126 U/L 94 84 80  AST 15 - 41 U/L 29 26 20   ALT 14 - 54 U/L 38 44(H) 27     Imaging: No results found.  Speciality Comments: No specialty comments available.    Procedures:  No procedures performed Allergies: Seroquel [quetiapine fumarate]; Adhesive [tape]; Aleve [naproxen sodium]; Carbamazepine; and Mold extract [trichophyton]   Assessment / Plan:     Visit Diagnoses: Positive ANA (antinuclear antibody) - 1:640 centromere, ENA -hx of mild Raynaud's, oral ulcers, fatigue  Primary osteoarthritis of both hands  Primary osteoarthritis of left knee  DDD (degenerative disc disease), cervical  Fibromyalgia  DDD (degenerative disc disease), lumbar  Vitamin D deficiency  Multiple thyroid nodules  History of melanoma  Bipolar depression (HCC)  Gastroesophageal reflux disease, esophagitis presence not specified  Hyperlipidemia, unspecified hyperlipidemia type    Orders: No orders of the defined types were placed in this encounter.  No orders of the defined types were placed in this encounter.   Face-to-face time spent with patient was *** minutes. 50% of time was spent in counseling and coordination of care.  Follow-Up Instructions: No follow-ups on file.   Ofilia Neas, PA-C  Note - This record has been created using Dragon software.  Chart creation errors have been sought, but may not always  have been  located. Such creation errors do not reflect on  the standard of medical care.

## 2018-01-08 ENCOUNTER — Ambulatory Visit: Payer: BLUE CROSS/BLUE SHIELD | Admitting: Rheumatology

## 2018-01-09 ENCOUNTER — Ambulatory Visit: Payer: BLUE CROSS/BLUE SHIELD | Admitting: Physician Assistant

## 2018-01-16 DIAGNOSIS — Z6841 Body Mass Index (BMI) 40.0 and over, adult: Secondary | ICD-10-CM | POA: Diagnosis not present

## 2018-01-16 DIAGNOSIS — Z1389 Encounter for screening for other disorder: Secondary | ICD-10-CM | POA: Diagnosis not present

## 2018-01-16 DIAGNOSIS — R51 Headache: Secondary | ICD-10-CM | POA: Diagnosis not present

## 2018-02-04 ENCOUNTER — Other Ambulatory Visit: Payer: Self-pay | Admitting: Family Medicine

## 2018-02-09 ENCOUNTER — Other Ambulatory Visit: Payer: Self-pay | Admitting: Family Medicine

## 2018-02-27 DIAGNOSIS — Z049 Encounter for examination and observation for unspecified reason: Secondary | ICD-10-CM | POA: Diagnosis not present

## 2018-02-27 DIAGNOSIS — G4452 New daily persistent headache (NDPH): Secondary | ICD-10-CM | POA: Diagnosis not present

## 2018-02-28 DIAGNOSIS — G4452 New daily persistent headache (NDPH): Secondary | ICD-10-CM | POA: Diagnosis not present

## 2018-02-28 DIAGNOSIS — M791 Myalgia, unspecified site: Secondary | ICD-10-CM | POA: Diagnosis not present

## 2018-02-28 DIAGNOSIS — M542 Cervicalgia: Secondary | ICD-10-CM | POA: Diagnosis not present

## 2018-02-28 DIAGNOSIS — G518 Other disorders of facial nerve: Secondary | ICD-10-CM | POA: Diagnosis not present

## 2018-04-04 DIAGNOSIS — R51 Headache: Secondary | ICD-10-CM | POA: Diagnosis not present

## 2018-04-04 DIAGNOSIS — G4452 New daily persistent headache (NDPH): Secondary | ICD-10-CM | POA: Diagnosis not present

## 2018-04-04 DIAGNOSIS — Z79899 Other long term (current) drug therapy: Secondary | ICD-10-CM | POA: Diagnosis not present

## 2018-04-05 ENCOUNTER — Other Ambulatory Visit (HOSPITAL_BASED_OUTPATIENT_CLINIC_OR_DEPARTMENT_OTHER): Payer: Self-pay | Admitting: Specialist

## 2018-04-05 DIAGNOSIS — G629 Polyneuropathy, unspecified: Secondary | ICD-10-CM | POA: Diagnosis not present

## 2018-04-05 DIAGNOSIS — G4452 New daily persistent headache (NDPH): Secondary | ICD-10-CM

## 2018-04-07 ENCOUNTER — Ambulatory Visit (HOSPITAL_BASED_OUTPATIENT_CLINIC_OR_DEPARTMENT_OTHER): Payer: BLUE CROSS/BLUE SHIELD

## 2018-04-10 DIAGNOSIS — G518 Other disorders of facial nerve: Secondary | ICD-10-CM | POA: Diagnosis not present

## 2018-04-10 DIAGNOSIS — G4452 New daily persistent headache (NDPH): Secondary | ICD-10-CM | POA: Diagnosis not present

## 2018-04-10 DIAGNOSIS — M542 Cervicalgia: Secondary | ICD-10-CM | POA: Diagnosis not present

## 2018-04-10 DIAGNOSIS — M791 Myalgia, unspecified site: Secondary | ICD-10-CM | POA: Diagnosis not present

## 2018-04-16 ENCOUNTER — Other Ambulatory Visit (HOSPITAL_BASED_OUTPATIENT_CLINIC_OR_DEPARTMENT_OTHER): Payer: Self-pay | Admitting: Family Medicine

## 2018-04-16 DIAGNOSIS — Z1231 Encounter for screening mammogram for malignant neoplasm of breast: Secondary | ICD-10-CM

## 2018-04-17 DIAGNOSIS — H9313 Tinnitus, bilateral: Secondary | ICD-10-CM | POA: Diagnosis not present

## 2018-04-17 DIAGNOSIS — R51 Headache: Secondary | ICD-10-CM | POA: Diagnosis not present

## 2018-04-19 DIAGNOSIS — L821 Other seborrheic keratosis: Secondary | ICD-10-CM | POA: Diagnosis not present

## 2018-04-19 DIAGNOSIS — L680 Hirsutism: Secondary | ICD-10-CM | POA: Diagnosis not present

## 2018-04-19 DIAGNOSIS — D224 Melanocytic nevi of scalp and neck: Secondary | ICD-10-CM | POA: Diagnosis not present

## 2018-04-24 DIAGNOSIS — G4452 New daily persistent headache (NDPH): Secondary | ICD-10-CM | POA: Diagnosis not present

## 2018-04-24 DIAGNOSIS — M542 Cervicalgia: Secondary | ICD-10-CM | POA: Diagnosis not present

## 2018-04-24 DIAGNOSIS — G518 Other disorders of facial nerve: Secondary | ICD-10-CM | POA: Diagnosis not present

## 2018-04-24 DIAGNOSIS — M791 Myalgia, unspecified site: Secondary | ICD-10-CM | POA: Diagnosis not present

## 2018-06-04 ENCOUNTER — Ambulatory Visit (HOSPITAL_BASED_OUTPATIENT_CLINIC_OR_DEPARTMENT_OTHER): Payer: Self-pay

## 2018-06-26 ENCOUNTER — Institutional Professional Consult (permissible substitution): Payer: Self-pay | Admitting: Pulmonary Disease

## 2018-08-22 DIAGNOSIS — Z1389 Encounter for screening for other disorder: Secondary | ICD-10-CM | POA: Diagnosis not present

## 2018-08-22 DIAGNOSIS — Z0000 Encounter for general adult medical examination without abnormal findings: Secondary | ICD-10-CM | POA: Diagnosis not present

## 2018-08-22 DIAGNOSIS — R42 Dizziness and giddiness: Secondary | ICD-10-CM | POA: Diagnosis not present

## 2018-08-22 DIAGNOSIS — Z6841 Body Mass Index (BMI) 40.0 and over, adult: Secondary | ICD-10-CM | POA: Diagnosis not present

## 2018-08-22 DIAGNOSIS — Z0001 Encounter for general adult medical examination with abnormal findings: Secondary | ICD-10-CM | POA: Diagnosis not present

## 2018-09-26 DIAGNOSIS — M79672 Pain in left foot: Secondary | ICD-10-CM | POA: Diagnosis not present

## 2018-09-26 DIAGNOSIS — S8001XA Contusion of right knee, initial encounter: Secondary | ICD-10-CM | POA: Diagnosis not present

## 2018-09-26 DIAGNOSIS — M7752 Other enthesopathy of left foot: Secondary | ICD-10-CM | POA: Diagnosis not present

## 2018-09-26 DIAGNOSIS — M25562 Pain in left knee: Secondary | ICD-10-CM | POA: Diagnosis not present

## 2018-09-26 DIAGNOSIS — W19XXXA Unspecified fall, initial encounter: Secondary | ICD-10-CM | POA: Diagnosis not present

## 2019-02-25 ENCOUNTER — Other Ambulatory Visit: Payer: Self-pay

## 2019-02-25 DIAGNOSIS — Z20822 Contact with and (suspected) exposure to covid-19: Secondary | ICD-10-CM

## 2019-02-26 LAB — NOVEL CORONAVIRUS, NAA: SARS-CoV-2, NAA: NOT DETECTED

## 2019-03-07 DIAGNOSIS — M5136 Other intervertebral disc degeneration, lumbar region: Secondary | ICD-10-CM | POA: Diagnosis not present

## 2019-03-07 DIAGNOSIS — J4520 Mild intermittent asthma, uncomplicated: Secondary | ICD-10-CM | POA: Insufficient documentation

## 2019-03-07 DIAGNOSIS — K760 Fatty (change of) liver, not elsewhere classified: Secondary | ICD-10-CM | POA: Insufficient documentation

## 2019-03-07 DIAGNOSIS — Z981 Arthrodesis status: Secondary | ICD-10-CM | POA: Insufficient documentation

## 2019-03-07 DIAGNOSIS — K219 Gastro-esophageal reflux disease without esophagitis: Secondary | ICD-10-CM | POA: Diagnosis not present

## 2019-07-18 DIAGNOSIS — Z808 Family history of malignant neoplasm of other organs or systems: Secondary | ICD-10-CM | POA: Diagnosis not present

## 2019-07-18 DIAGNOSIS — Z85820 Personal history of malignant melanoma of skin: Secondary | ICD-10-CM | POA: Diagnosis not present

## 2019-07-18 DIAGNOSIS — D1801 Hemangioma of skin and subcutaneous tissue: Secondary | ICD-10-CM | POA: Diagnosis not present

## 2019-07-18 DIAGNOSIS — L720 Epidermal cyst: Secondary | ICD-10-CM | POA: Diagnosis not present

## 2019-07-24 DIAGNOSIS — R5383 Other fatigue: Secondary | ICD-10-CM | POA: Diagnosis not present

## 2019-07-24 DIAGNOSIS — J069 Acute upper respiratory infection, unspecified: Secondary | ICD-10-CM | POA: Diagnosis not present

## 2019-07-24 DIAGNOSIS — R05 Cough: Secondary | ICD-10-CM | POA: Diagnosis not present

## 2019-07-24 DIAGNOSIS — Z20822 Contact with and (suspected) exposure to covid-19: Secondary | ICD-10-CM | POA: Diagnosis not present

## 2019-08-15 DIAGNOSIS — G8929 Other chronic pain: Secondary | ICD-10-CM | POA: Diagnosis not present

## 2019-08-15 DIAGNOSIS — D509 Iron deficiency anemia, unspecified: Secondary | ICD-10-CM | POA: Diagnosis not present

## 2019-08-15 DIAGNOSIS — R350 Frequency of micturition: Secondary | ICD-10-CM | POA: Diagnosis not present

## 2019-08-15 DIAGNOSIS — R002 Palpitations: Secondary | ICD-10-CM | POA: Diagnosis not present

## 2019-08-15 DIAGNOSIS — Z6841 Body Mass Index (BMI) 40.0 and over, adult: Secondary | ICD-10-CM | POA: Diagnosis not present

## 2019-09-02 DIAGNOSIS — G4710 Hypersomnia, unspecified: Secondary | ICD-10-CM | POA: Insufficient documentation

## 2019-09-02 DIAGNOSIS — R0683 Snoring: Secondary | ICD-10-CM | POA: Insufficient documentation

## 2019-09-02 DIAGNOSIS — M792 Neuralgia and neuritis, unspecified: Secondary | ICD-10-CM | POA: Insufficient documentation

## 2019-09-02 DIAGNOSIS — R200 Anesthesia of skin: Secondary | ICD-10-CM | POA: Diagnosis not present

## 2019-09-02 DIAGNOSIS — R400 Somnolence: Secondary | ICD-10-CM | POA: Diagnosis not present

## 2020-01-17 DIAGNOSIS — H40013 Open angle with borderline findings, low risk, bilateral: Secondary | ICD-10-CM | POA: Diagnosis not present

## 2020-02-13 DIAGNOSIS — D485 Neoplasm of uncertain behavior of skin: Secondary | ICD-10-CM | POA: Diagnosis not present

## 2020-02-13 DIAGNOSIS — Z85820 Personal history of malignant melanoma of skin: Secondary | ICD-10-CM | POA: Diagnosis not present

## 2020-02-13 DIAGNOSIS — D225 Melanocytic nevi of trunk: Secondary | ICD-10-CM | POA: Diagnosis not present

## 2020-02-13 DIAGNOSIS — D2271 Melanocytic nevi of right lower limb, including hip: Secondary | ICD-10-CM | POA: Diagnosis not present

## 2020-02-13 DIAGNOSIS — Z1283 Encounter for screening for malignant neoplasm of skin: Secondary | ICD-10-CM | POA: Diagnosis not present

## 2020-02-13 DIAGNOSIS — Z08 Encounter for follow-up examination after completed treatment for malignant neoplasm: Secondary | ICD-10-CM | POA: Diagnosis not present

## 2020-04-13 DIAGNOSIS — E7849 Other hyperlipidemia: Secondary | ICD-10-CM | POA: Diagnosis not present

## 2020-04-13 DIAGNOSIS — E782 Mixed hyperlipidemia: Secondary | ICD-10-CM | POA: Diagnosis not present

## 2020-04-13 DIAGNOSIS — K449 Diaphragmatic hernia without obstruction or gangrene: Secondary | ICD-10-CM | POA: Diagnosis not present

## 2020-04-13 DIAGNOSIS — Z6841 Body Mass Index (BMI) 40.0 and over, adult: Secondary | ICD-10-CM | POA: Diagnosis not present

## 2020-04-13 DIAGNOSIS — R946 Abnormal results of thyroid function studies: Secondary | ICD-10-CM | POA: Diagnosis not present

## 2020-04-13 DIAGNOSIS — R768 Other specified abnormal immunological findings in serum: Secondary | ICD-10-CM | POA: Diagnosis not present

## 2020-04-13 DIAGNOSIS — K219 Gastro-esophageal reflux disease without esophagitis: Secondary | ICD-10-CM | POA: Diagnosis not present

## 2020-04-13 DIAGNOSIS — R7309 Other abnormal glucose: Secondary | ICD-10-CM | POA: Diagnosis not present

## 2020-05-07 DIAGNOSIS — M542 Cervicalgia: Secondary | ICD-10-CM | POA: Diagnosis not present

## 2020-05-28 DIAGNOSIS — L918 Other hypertrophic disorders of the skin: Secondary | ICD-10-CM | POA: Diagnosis not present

## 2020-05-28 DIAGNOSIS — B078 Other viral warts: Secondary | ICD-10-CM | POA: Diagnosis not present

## 2020-05-28 DIAGNOSIS — L821 Other seborrheic keratosis: Secondary | ICD-10-CM | POA: Diagnosis not present

## 2020-06-01 DIAGNOSIS — Z6841 Body Mass Index (BMI) 40.0 and over, adult: Secondary | ICD-10-CM | POA: Diagnosis not present

## 2020-06-01 DIAGNOSIS — M5030 Other cervical disc degeneration, unspecified cervical region: Secondary | ICD-10-CM | POA: Diagnosis not present

## 2020-06-19 ENCOUNTER — Other Ambulatory Visit: Payer: Self-pay | Admitting: Physician Assistant

## 2020-06-19 DIAGNOSIS — Z1231 Encounter for screening mammogram for malignant neoplasm of breast: Secondary | ICD-10-CM

## 2020-07-28 DIAGNOSIS — J22 Unspecified acute lower respiratory infection: Secondary | ICD-10-CM | POA: Diagnosis not present

## 2020-07-29 ENCOUNTER — Other Ambulatory Visit: Payer: BC Managed Care – PPO

## 2020-07-29 DIAGNOSIS — Z20822 Contact with and (suspected) exposure to covid-19: Secondary | ICD-10-CM | POA: Diagnosis not present

## 2020-07-31 LAB — SARS-COV-2, NAA 2 DAY TAT

## 2020-07-31 LAB — NOVEL CORONAVIRUS, NAA: SARS-CoV-2, NAA: NOT DETECTED

## 2020-09-01 DIAGNOSIS — U071 COVID-19: Secondary | ICD-10-CM | POA: Diagnosis not present

## 2020-09-01 DIAGNOSIS — J069 Acute upper respiratory infection, unspecified: Secondary | ICD-10-CM | POA: Diagnosis not present

## 2021-01-21 DIAGNOSIS — H40013 Open angle with borderline findings, low risk, bilateral: Secondary | ICD-10-CM | POA: Diagnosis not present

## 2021-01-25 ENCOUNTER — Other Ambulatory Visit (HOSPITAL_COMMUNITY): Payer: Self-pay | Admitting: Family Medicine

## 2021-01-25 DIAGNOSIS — Z6841 Body Mass Index (BMI) 40.0 and over, adult: Secondary | ICD-10-CM | POA: Diagnosis not present

## 2021-01-25 DIAGNOSIS — R7309 Other abnormal glucose: Secondary | ICD-10-CM | POA: Diagnosis not present

## 2021-01-25 DIAGNOSIS — E782 Mixed hyperlipidemia: Secondary | ICD-10-CM | POA: Diagnosis not present

## 2021-01-25 DIAGNOSIS — R946 Abnormal results of thyroid function studies: Secondary | ICD-10-CM

## 2021-01-25 DIAGNOSIS — G2581 Restless legs syndrome: Secondary | ICD-10-CM | POA: Diagnosis not present

## 2021-01-25 DIAGNOSIS — Z0001 Encounter for general adult medical examination with abnormal findings: Secondary | ICD-10-CM | POA: Diagnosis not present

## 2021-01-25 DIAGNOSIS — R0789 Other chest pain: Secondary | ICD-10-CM | POA: Diagnosis not present

## 2021-01-25 DIAGNOSIS — Z1389 Encounter for screening for other disorder: Secondary | ICD-10-CM | POA: Diagnosis not present

## 2021-01-25 DIAGNOSIS — Z1231 Encounter for screening mammogram for malignant neoplasm of breast: Secondary | ICD-10-CM

## 2021-01-25 DIAGNOSIS — Z1331 Encounter for screening for depression: Secondary | ICD-10-CM | POA: Diagnosis not present

## 2021-01-25 DIAGNOSIS — M2550 Pain in unspecified joint: Secondary | ICD-10-CM | POA: Diagnosis not present

## 2021-01-25 DIAGNOSIS — H9313 Tinnitus, bilateral: Secondary | ICD-10-CM | POA: Diagnosis not present

## 2021-01-28 DIAGNOSIS — D485 Neoplasm of uncertain behavior of skin: Secondary | ICD-10-CM | POA: Diagnosis not present

## 2021-01-28 DIAGNOSIS — Z85820 Personal history of malignant melanoma of skin: Secondary | ICD-10-CM | POA: Diagnosis not present

## 2021-01-28 DIAGNOSIS — Z08 Encounter for follow-up examination after completed treatment for malignant neoplasm: Secondary | ICD-10-CM | POA: Diagnosis not present

## 2021-01-28 DIAGNOSIS — D225 Melanocytic nevi of trunk: Secondary | ICD-10-CM | POA: Diagnosis not present

## 2021-01-28 DIAGNOSIS — L820 Inflamed seborrheic keratosis: Secondary | ICD-10-CM | POA: Diagnosis not present

## 2021-01-28 DIAGNOSIS — B078 Other viral warts: Secondary | ICD-10-CM | POA: Diagnosis not present

## 2021-02-04 ENCOUNTER — Other Ambulatory Visit (HOSPITAL_COMMUNITY): Payer: Self-pay | Admitting: Family Medicine

## 2021-02-04 ENCOUNTER — Ambulatory Visit (HOSPITAL_COMMUNITY)
Admission: RE | Admit: 2021-02-04 | Discharge: 2021-02-04 | Disposition: A | Discharge status: Home / Self Care | Payer: BC Managed Care – PPO | Source: Ambulatory Visit | Attending: Family Medicine | Admitting: Family Medicine

## 2021-02-04 ENCOUNTER — Other Ambulatory Visit: Payer: Self-pay

## 2021-02-04 DIAGNOSIS — R079 Chest pain, unspecified: Secondary | ICD-10-CM

## 2021-02-04 DIAGNOSIS — R0602 Shortness of breath: Secondary | ICD-10-CM | POA: Diagnosis not present

## 2021-02-04 DIAGNOSIS — R946 Abnormal results of thyroid function studies: Secondary | ICD-10-CM | POA: Diagnosis not present

## 2021-02-04 DIAGNOSIS — E041 Nontoxic single thyroid nodule: Secondary | ICD-10-CM | POA: Diagnosis not present

## 2021-02-17 DIAGNOSIS — G4730 Sleep apnea, unspecified: Secondary | ICD-10-CM | POA: Diagnosis not present

## 2021-03-15 ENCOUNTER — Other Ambulatory Visit: Payer: Self-pay

## 2021-03-15 ENCOUNTER — Emergency Department (HOSPITAL_COMMUNITY): Payer: BC Managed Care – PPO

## 2021-03-15 ENCOUNTER — Emergency Department (HOSPITAL_COMMUNITY)
Admission: EM | Admit: 2021-03-15 | Discharge: 2021-03-15 | Discharge status: Against Medical Advice | Payer: BC Managed Care – PPO | Attending: Emergency Medicine | Admitting: Emergency Medicine

## 2021-03-15 ENCOUNTER — Encounter (HOSPITAL_COMMUNITY): Payer: Self-pay

## 2021-03-15 DIAGNOSIS — Z8616 Personal history of COVID-19: Secondary | ICD-10-CM | POA: Diagnosis not present

## 2021-03-15 DIAGNOSIS — Z85820 Personal history of malignant melanoma of skin: Secondary | ICD-10-CM | POA: Insufficient documentation

## 2021-03-15 DIAGNOSIS — U071 COVID-19: Secondary | ICD-10-CM | POA: Insufficient documentation

## 2021-03-15 DIAGNOSIS — R0602 Shortness of breath: Secondary | ICD-10-CM | POA: Diagnosis not present

## 2021-03-15 LAB — CBC WITH DIFFERENTIAL/PLATELET
Abs Immature Granulocytes: 0.03 10*3/uL (ref 0.00–0.07)
Basophils Absolute: 0 10*3/uL (ref 0.0–0.1)
Basophils Relative: 0 %
Eosinophils Absolute: 0.1 10*3/uL (ref 0.0–0.5)
Eosinophils Relative: 1 %
HCT: 44.2 % (ref 36.0–46.0)
Hemoglobin: 14.4 g/dL (ref 12.0–15.0)
Immature Granulocytes: 0 %
Lymphocytes Relative: 19 %
Lymphs Abs: 1.5 10*3/uL (ref 0.7–4.0)
MCH: 28.5 pg (ref 26.0–34.0)
MCHC: 32.6 g/dL (ref 30.0–36.0)
MCV: 87.4 fL (ref 80.0–100.0)
Monocytes Absolute: 0.6 10*3/uL (ref 0.1–1.0)
Monocytes Relative: 7 %
Neutro Abs: 5.9 10*3/uL (ref 1.7–7.7)
Neutrophils Relative %: 73 %
Platelets: 252 10*3/uL (ref 150–400)
RBC: 5.06 MIL/uL (ref 3.87–5.11)
RDW: 13.6 % (ref 11.5–15.5)
WBC: 8.1 10*3/uL (ref 4.0–10.5)
nRBC: 0 % (ref 0.0–0.2)

## 2021-03-15 LAB — BASIC METABOLIC PANEL
Anion gap: 9 (ref 5–15)
BUN: 11 mg/dL (ref 6–20)
CO2: 23 mmol/L (ref 22–32)
Calcium: 8.8 mg/dL — ABNORMAL LOW (ref 8.9–10.3)
Chloride: 104 mmol/L (ref 98–111)
Creatinine, Ser: 0.76 mg/dL (ref 0.44–1.00)
GFR, Estimated: >60 mL/min (ref >60)
Glucose, Bld: 106 mg/dL — ABNORMAL HIGH (ref 70–99)
Potassium: 3.8 mmol/L (ref 3.5–5.1)
Sodium: 136 mmol/L (ref 135–145)

## 2021-03-15 LAB — RESP PANEL BY RT-PCR (FLU A&B, COVID) ARPGX2
Influenza A by PCR: NEGATIVE
Influenza B by PCR: NEGATIVE
SARS Coronavirus 2 by RT PCR: POSITIVE — AB

## 2021-03-15 MED ORDER — ONDANSETRON HCL 4 MG/2ML IJ SOLN
4.0000 mg | Freq: Once | INTRAMUSCULAR | Status: AC
  Administered 2021-03-15: 4 mg via INTRAVENOUS
  Filled 2021-03-15: qty 2

## 2021-03-15 MED ORDER — DEXAMETHASONE SODIUM PHOSPHATE 10 MG/ML IJ SOLN
10.0000 mg | Freq: Once | INTRAMUSCULAR | Status: AC
  Administered 2021-03-15: 10 mg via INTRAVENOUS
  Filled 2021-03-15: qty 1

## 2021-03-15 MED ORDER — ALBUTEROL SULFATE HFA 108 (90 BASE) MCG/ACT IN AERS
4.0000 | INHALATION_SPRAY | Freq: Once | RESPIRATORY_TRACT | Status: AC
  Administered 2021-03-15: 4 via RESPIRATORY_TRACT
  Filled 2021-03-15: qty 6.7

## 2021-03-15 NOTE — ED Notes (Signed)
Husband called to provide updates

## 2021-03-15 NOTE — ED Notes (Signed)
Pt reports to staff that she feels better and is leaving. Educated on importance to wait for MD disposition

## 2021-03-15 NOTE — ED Provider Notes (Signed)
[place] DEPT Provider Note   CSN: [id number] Arrival date & time: 03/15/21  1039     History Chief Complaint  Patient presents with   Sore Throat   Shortness of Breath    [name] is a 50 y.o. female.   Sore Throat Associated symptoms include shortness of breath.  Shortness of Breath  Patient presented to the ED for evaluation of shortness of breath, sore throat, headaches.  Patient states her husband was diagnosed with COVID on August 24.  Patient started having symptoms 3 days ago.  She has not been vaccinated for COVID.  Patient states she is having trouble swallowing.  She feels like her throat is closing up.  It hurts to swallow.  She has felt feverish.  She feels short of breath and has been coughing.  She is bringing up white sputum.  She denies any chest pain.  No abdominal pain.  No leg swelling.  Past Medical History:  Diagnosis Date   Abdominal pain 03/18/2016   Allergy    Anemia    in the past   Anxiety    Anxiety disorder    Bipolar depression ([place]) 05/29/2016   Breast lesion 12/06/2015   Chicken pox    Complication of anesthesia 1999   urinary retention   Depression    in teh past- ok now   Diarrhea 03/18/2016   Fibromyalgia    GERD (gastroesophageal reflux disease)    Hyperglycemia 03/18/2016   Hyperlipidemia 08/07/2015   Left thyroid nodule 03/31/2013   Melanoma ([place])    right hip and knee   Migraine    Neck pain on right side 12/23/2012   Obesity    Pain 03/31/2013   Palpitations 08/10/2014   Shortness of breath    when anemic   Sinusitis 08/07/2015   Skin lesion of breast 12/06/2015   TIA (transient ischemic attack) 03/31/2013   UTI (lower urinary tract infection)     Patient Active Problem List   Diagnosis Date Noted   Hematemesis without nausea 08/17/2017   Abdominal pain, epigastric 08/17/2017   Regurgitation of food    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   Vasomotor symptoms due to menopause  11/23/2016   History of melanoma 10/28/2016   DJD (degenerative joint disease), cervical 10/25/2016   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   Primary osteoarthritis of both hands 10/25/2016   Primary osteoarthritis of left knee 10/25/2016   Fibromyalgia 10/25/2016   Allergic rhinitis with a probable non-allergic component 10/24/2016   ANA positive 10/21/2016   Elevated sed rate 05/29/2016   Bipolar depression ([place]) 05/29/2016   Fever [id number]   Other complicated headache syndrome 05/23/2016   Rash and nonspecific skin eruption 05/23/2016   Hyperglycemia 03/18/2016   Diarrhea 03/18/2016   Abdominal pain 03/18/2016   Perimenopausal 01/31/2016   Vitamin D deficiency 01/31/2016   Myalgia 01/31/2016   Skin lesion of breast 12/06/2015   Nausea and vomiting 08/12/2015   Dysphagia 08/12/2015   Peptic stricture of esophagus 08/12/2015   Hyperlipidemia 08/07/2015   Left foot pain 08/07/2015   Recurrent sinusitis 08/07/2015   Back pain 08/19/2014   Palpitations 08/10/2014   Pain 03/31/2013   Neck pain on right side 12/23/2012   Fibroids 07/04/2012   Menorrhagia 06/25/2012   Hypertriglyceridemia 04/23/2012   Annual physical exam 12/15/2011   Obesity 12/10/2010   Lower extremity edema 12/10/2010   Iron deficiency anemia 08/18/2009   GERD 08/18/2009    Past Surgical History:  Procedure Laterality  Date   24 HOUR [place] STUDY N/A 04/12/2017   Procedure: [place];  Surgeon: Mauri Pole, MD;  Location: [facility];  Service: Endoscopy;  Laterality: N/A;   ABDOMINAL HYSTERECTOMY     BACK SURGERY     BILATERAL SALPINGECTOMY Bilateral 09/13/2012   Procedure: BILATERAL SALPINGECTOMY;  Surgeon: Lavonia Drafts, MD;  Location: [facility];  Service: Gynecology;  Laterality: Bilateral;   CHOLECYSTECTOMY     COLONOSCOPY     ESOPHAGEAL MANOMETRY N/A 04/12/2017   Procedure: ESOPHAGEAL MANOMETRY (EM);  Surgeon: Mauri Pole, MD;  Location: [facility];  Service:  Endoscopy;  Laterality: N/A;   LAPAROSCOPIC ASSISTED VAGINAL HYSTERECTOMY N/A 09/13/2012   Procedure: LAPAROSCOPIC ASSISTED VAGINAL HYSTERECTOMY;  Surgeon: Lavonia Drafts, MD;  Location: [facility];  Service: Gynecology;  Laterality: N/A;   MELANOMA EXCISION  2011   stage I and II   WISDOM TOOTH EXTRACTION  1989     OB History     Gravida  0   Para      Term      Preterm      AB      Living         SAB      IAB      Ectopic      Multiple      Live Births              Family History  Problem Relation Age of Onset   Colitis Father        crohn's   Heart disease Father        grandfather   Heart attack Father 14   Arthritis Father        s/p back surgery   Other Mother        neurological autoimmune disease, chorea in trunk   Eczema Mother    Asthma Mother    Arthritis Maternal Grandmother    Lupus Maternal Grandmother    Dementia Maternal Grandmother    CVA Maternal Grandmother    Arthritis Paternal Grandmother    Heart disease Paternal Grandmother        chf   Asthma Paternal Grandmother    Diabetes Paternal Grandfather    Kidney disease Paternal Grandfather    Hypertension Paternal Grandfather    Hyperlipidemia Paternal Grandfather    Cancer Maternal Grandfather        bone   Diabetes Unknown        grandfather   Irritable bowel syndrome Unknown    Kidney disease Unknown        grandfather   Arthritis Other    Cancer Other        breast   Hyperlipidemia Other    Mental illness Other    Emphysema Maternal Uncle    Colon cancer Neg Hx    Allergic rhinitis Neg Hx    Angioedema Neg Hx    Immunodeficiency Neg Hx    Urticaria Neg Hx     Social History   Tobacco Use   Smoking status: Never   Smokeless tobacco: Never   Tobacco comments:    never used tobacco  Vaping Use   Vaping Use: Never used  Substance Use Topics   Alcohol use: No    Alcohol/week: 0.0 standard drinks   Drug use: No    Home Medications Prior to Admission  medications   Medication Sig Start Date End Date Taking? Authorizing Provider  buPROPion (WELLBUTRIN XL) 300 MG 24  hr tablet Take 300 mg by mouth daily.    [provider]  cetirizine (ZYRTEC) 10 MG tablet Take 1 tablet (10 mg total) by mouth daily. 10/27/16   Mosie Lukes, MD  cholecalciferol (VITAMIN D) 1000 units tablet Take 2,000 Units by mouth daily.    [provider]  gabapentin (NEURONTIN) 300 MG capsule Take 1 capsule (300 mg total) by mouth at bedtime. 09/06/17   Bo Merino, MD  loperamide (IMODIUM) 2 MG capsule Take by mouth as needed for diarrhea or loose stools.    [provider]  Multiple Vitamins-Minerals (MULTIVITAMIN ADULT PO) Take by mouth.    [provider]  ondansetron (ZOFRAN) 4 MG tablet Take 1 tablet (4 mg total) by mouth every 6 (six) hours as needed for nausea or vomiting. 08/17/17   Zehr, Janett Billow D, PA-C  pantoprazole (PROTONIX) 40 MG tablet TAKE 1 TABLET(40 MG) BY MOUTH TWICE DAILY 11/14/17   Mosie Lukes, MD  Probiotic Product (ACIDOPHILUS/BIFIDUS PO) Take 4 each by mouth daily.    [provider]  Vitamin D, Ergocalciferol, (DRISDOL) 50000 units CAPS capsule TAKE 1 CAPSULE BY MOUTH EVERY 7 DAYS- THEN TAKE OTC VITAMIN DAILY 2000 IU DAILY 08/31/17   Mosie Lukes, MD    Allergies    Other, Seroquel [quetiapine fumarate], Adhesive [tape], Aleve [naproxen sodium], Carbamazepine, Gabapentin, and Mold extract [trichophyton]  Review of Systems   Review of Systems  Respiratory:  Positive for shortness of breath.   All other systems reviewed and are negative.  Physical Exam Updated Vital Signs BP (!) 129/94   Pulse 87   Temp 99 F (37.2 C)   Resp 20   Ht 1.753 m ('5\' 9"'$ )   Wt (!) 142 kg   LMP 08/13/2012   SpO2 95%   BMI 46.23 kg/m   Physical Exam Vitals and nursing note reviewed.  Constitutional:      Appearance: She is well-developed. She is not diaphoretic.  HENT:     Head: Normocephalic and  atraumatic.     Comments: Voice is hoarse    Right Ear: External ear normal.     Left Ear: External ear normal.     Nose: No congestion or rhinorrhea.     Mouth/Throat:     Mouth: No oral lesions.     Pharynx: No pharyngeal swelling or oropharyngeal exudate.     Tonsils: No tonsillar exudate or tonsillar abscesses.  Eyes:     General: No scleral icterus.       Right eye: No discharge.        Left eye: No discharge.     Conjunctiva/sclera: Conjunctivae normal.  Neck:     Trachea: No tracheal deviation.  Cardiovascular:     Rate and Rhythm: Normal rate and regular rhythm.  Pulmonary:     Effort: Pulmonary effort is normal. No respiratory distress.     Breath sounds: No stridor. Wheezing present. No rales.  Abdominal:     General: Bowel sounds are normal. There is no distension.     Palpations: Abdomen is soft.     Tenderness: There is no abdominal tenderness. There is no guarding or rebound.  Musculoskeletal:        General: No tenderness or deformity.     Cervical back: Neck supple.  Skin:    General: Skin is warm and dry.     Findings: No rash.  Neurological:     General: No focal deficit present.     Mental  Status: She is alert.     Cranial Nerves: No cranial nerve deficit (no facial droop, extraocular movements intact, no slurred speech).     Sensory: No sensory deficit.     Motor: No abnormal muscle tone or seizure activity.     Coordination: Coordination normal.  Psychiatric:        Mood and Affect: Mood normal.    ED Results / Procedures / Treatments   Labs (all labs ordered are listed, but only abnormal results are displayed) Labs Reviewed  RESP PANEL BY RT-PCR (FLU A&B, COVID) ARPGX2 - Abnormal; Notable for the following components:      Result Value   SARS Coronavirus 2 by RT PCR POSITIVE (*)    All other components within normal limits  BASIC METABOLIC PANEL - Abnormal; Notable for the following components:   Glucose, Bld 106 (*)    Calcium 8.8 (*)    All  other components within normal limits  CBC WITH DIFFERENTIAL/PLATELET    EKG None  Radiology DG Chest Portable 1 View  Result Date: 03/15/2021 CLINICAL DATA:  Shortness of breath for several days EXAM: PORTABLE CHEST 1 VIEW COMPARISON:  02/04/2021 FINDINGS: The heart size and mediastinal contours are within normal limits. Both lungs are clear. The visualized skeletal structures are unremarkable. IMPRESSION: No active disease. Electronically Signed   By: Inez Catalina M.D.   On: 03/15/2021 11:49    Procedures Procedures   Medications Ordered in ED Medications  dexamethasone (DECADRON) injection 10 mg (10 mg Intravenous Given 03/15/21 1116)  albuterol (VENTOLIN HFA) 108 (90 Base) MCG/ACT inhaler 4 puff (4 puffs Inhalation Given 03/15/21 1117)  ondansetron (ZOFRAN) injection 4 mg (4 mg Intravenous Given 03/15/21 1117)    ED Course  I have reviewed the triage vital signs and the nursing notes.  Pertinent labs & imaging results that were available during my care of the patient were reviewed by me and considered in my medical decision making (see chart for details).  Clinical Course as of 03/15/21 1358  Mon Mar 15, 2021  0000000 CBC and metabolic panel normal [JK]  1316 Chest x-ray without acute findings [JK]  1356 I was notified the patient felt better and told staff she was ready to leave.  She ended up leaving before I was able to discharge her or speak to her about her evaluation in the ED [JK]    Clinical Course User Index [JK] Dorie Rank, MD   MDM Rules/Calculators/A&P                           Patient presented to the ED with complaints of difficulty breathing, sore throat with recent COVID exposure.  Patient's ED work-up was notable for positive COVID-19.  She does not have evidence of pneumonia.  Her laboratory tests were unremarkable.  Patient was given a dose of Decadron Zofran and albuterol.  Apparently she was feeling better and she decided to leave before I was able to  discharge her or go over any her test results.  Patient would be a candidate for paxlovid as she does have risk factors and has not been vaccinated.  However patient left before I was able to go over any treatment with her. Final Clinical Impression(s) / ED Diagnoses Final diagnoses:  COVID-19 virus infection    Rx / DC Orders ED Discharge Orders     None        Dorie Rank, MD 03/15/21 1358

## 2021-03-15 NOTE — ED Triage Notes (Signed)
Pt reports sore throat, shob, headache x 3 days. Pt reports husband covid + 03/10/2021. Pt denies testing for covid.

## 2021-05-12 DIAGNOSIS — N201 Calculus of ureter: Secondary | ICD-10-CM | POA: Diagnosis not present

## 2021-05-12 DIAGNOSIS — N202 Calculus of kidney with calculus of ureter: Secondary | ICD-10-CM | POA: Diagnosis not present

## 2021-05-12 DIAGNOSIS — Z9089 Acquired absence of other organs: Secondary | ICD-10-CM | POA: Insufficient documentation

## 2021-05-12 DIAGNOSIS — Z85820 Personal history of malignant melanoma of skin: Secondary | ICD-10-CM | POA: Insufficient documentation

## 2021-05-12 DIAGNOSIS — N1330 Unspecified hydronephrosis: Secondary | ICD-10-CM | POA: Diagnosis not present

## 2021-05-12 DIAGNOSIS — R1032 Left lower quadrant pain: Secondary | ICD-10-CM | POA: Diagnosis not present

## 2021-05-12 DIAGNOSIS — Z79899 Other long term (current) drug therapy: Secondary | ICD-10-CM | POA: Insufficient documentation

## 2021-05-12 DIAGNOSIS — N281 Cyst of kidney, acquired: Secondary | ICD-10-CM | POA: Diagnosis not present

## 2021-05-12 DIAGNOSIS — Z9049 Acquired absence of other specified parts of digestive tract: Secondary | ICD-10-CM | POA: Diagnosis not present

## 2021-05-13 ENCOUNTER — Emergency Department (HOSPITAL_COMMUNITY)
Admission: EM | Admit: 2021-05-13 | Discharge: 2021-05-13 | Disposition: A | Discharge status: Home / Self Care | Payer: BC Managed Care – PPO | Attending: Emergency Medicine | Admitting: Emergency Medicine

## 2021-05-13 ENCOUNTER — Other Ambulatory Visit: Payer: Self-pay

## 2021-05-13 ENCOUNTER — Emergency Department (HOSPITAL_COMMUNITY): Payer: BC Managed Care – PPO

## 2021-05-13 DIAGNOSIS — N1330 Unspecified hydronephrosis: Secondary | ICD-10-CM | POA: Diagnosis not present

## 2021-05-13 DIAGNOSIS — Z9049 Acquired absence of other specified parts of digestive tract: Secondary | ICD-10-CM | POA: Diagnosis not present

## 2021-05-13 DIAGNOSIS — N281 Cyst of kidney, acquired: Secondary | ICD-10-CM | POA: Diagnosis not present

## 2021-05-13 DIAGNOSIS — N202 Calculus of kidney with calculus of ureter: Secondary | ICD-10-CM | POA: Diagnosis not present

## 2021-05-13 DIAGNOSIS — N201 Calculus of ureter: Secondary | ICD-10-CM

## 2021-05-13 LAB — BASIC METABOLIC PANEL
Anion gap: 11 (ref 5–15)
BUN: 17 mg/dL (ref 6–20)
CO2: 22 mmol/L (ref 22–32)
Calcium: 9.3 mg/dL (ref 8.9–10.3)
Chloride: 104 mmol/L (ref 98–111)
Creatinine, Ser: 1.06 mg/dL — ABNORMAL HIGH (ref 0.44–1.00)
GFR, Estimated: >60 mL/min (ref >60)
Glucose, Bld: 122 mg/dL — ABNORMAL HIGH (ref 70–99)
Potassium: 3.7 mmol/L (ref 3.5–5.1)
Sodium: 137 mmol/L (ref 135–145)

## 2021-05-13 LAB — CBC WITH DIFFERENTIAL/PLATELET
Abs Immature Granulocytes: 0.03 10*3/uL (ref 0.00–0.07)
Basophils Absolute: 0.1 10*3/uL (ref 0.0–0.1)
Basophils Relative: 0 %
Eosinophils Absolute: 0.1 10*3/uL (ref 0.0–0.5)
Eosinophils Relative: 1 %
HCT: 41.5 % (ref 36.0–46.0)
Hemoglobin: 14 g/dL (ref 12.0–15.0)
Immature Granulocytes: 0 %
Lymphocytes Relative: 18 %
Lymphs Abs: 2.2 10*3/uL (ref 0.7–4.0)
MCH: 28.9 pg (ref 26.0–34.0)
MCHC: 33.7 g/dL (ref 30.0–36.0)
MCV: 85.6 fL (ref 80.0–100.0)
Monocytes Absolute: 0.6 10*3/uL (ref 0.1–1.0)
Monocytes Relative: 5 %
Neutro Abs: 9 10*3/uL — ABNORMAL HIGH (ref 1.7–7.7)
Neutrophils Relative %: 76 %
Platelets: 315 10*3/uL (ref 150–400)
RBC: 4.85 MIL/uL (ref 3.87–5.11)
RDW: 13.5 % (ref 11.5–15.5)
WBC: 11.9 10*3/uL — ABNORMAL HIGH (ref 4.0–10.5)
nRBC: 0 % (ref 0.0–0.2)

## 2021-05-13 MED ORDER — OXYCODONE-ACETAMINOPHEN 5-325 MG PO TABS: 1.0000 | ORAL_TABLET | ORAL | 0 refills | Status: DC | PRN

## 2021-05-13 MED ORDER — HYDROMORPHONE HCL 1 MG/ML IJ SOLN
1.0000 mg | Freq: Once | INTRAMUSCULAR | Status: AC
  Administered 2021-05-13: 1 mg via INTRAMUSCULAR
  Filled 2021-05-13: qty 1

## 2021-05-13 MED ORDER — HYDROMORPHONE HCL 1 MG/ML IJ SOLN
1.0000 mg | Freq: Once | INTRAMUSCULAR | Status: AC
  Administered 2021-05-13: 1 mg via INTRAVENOUS
  Filled 2021-05-13: qty 1

## 2021-05-13 MED ORDER — TAMSULOSIN HCL 0.4 MG PO CAPS: 0.4000 mg | ORAL_CAPSULE | Freq: Every day | ORAL | 0 refills | Status: DC

## 2021-05-13 MED ORDER — HYDROMORPHONE HCL 1 MG/ML IJ SOLN: 2.0000 mg | Freq: Once | INTRAMUSCULAR | Status: DC

## 2021-05-13 MED ORDER — SODIUM CHLORIDE 0.9 % IV SOLN
12.5000 mg | Freq: Four times a day (QID) | INTRAVENOUS | Status: DC | PRN
  Administered 2021-05-13: 12.5 mg via INTRAVENOUS
  Filled 2021-05-13: qty 12.5

## 2021-05-13 MED ORDER — KETOROLAC TROMETHAMINE 30 MG/ML IJ SOLN
30.0000 mg | Freq: Once | INTRAMUSCULAR | Status: AC
  Administered 2021-05-13: 30 mg via INTRAVENOUS
  Filled 2021-05-13: qty 1

## 2021-05-13 MED ORDER — ONDANSETRON 4 MG PO TBDP
4.0000 mg | ORAL_TABLET | Freq: Once | ORAL | Status: AC
  Administered 2021-05-13: 4 mg via ORAL
  Filled 2021-05-13: qty 1

## 2021-05-13 MED ORDER — PROMETHAZINE HCL 25 MG PO TABS: 25.0000 mg | ORAL_TABLET | Freq: Four times a day (QID) | ORAL | 0 refills | Status: DC | PRN

## 2021-05-13 NOTE — ED Provider Notes (Signed)
[facility]: [facility] EMERGENCY DEPARTMENT Provider Note   CSN: [id number] Arrival date & time: 05/12/21  2358     History Chief Complaint  Patient presents with   Flank Pain    [name] is a 50 y.o. female.  Patient to ED with sudden onset tonight of left flank pain, radiates to left side abdomen, associated with nausea, vomiting. No history of kidney stones. No hematuria or trouble with urination. No fever. No diarrhea.   The history is provided by the patient and the spouse. No language interpreter was used.  Flank Pain Associated symptoms include abdominal pain.      Past Medical History:  Diagnosis Date   Abdominal pain 03/18/2016   Allergy    Anemia    in the past   Anxiety    Anxiety disorder    Bipolar depression ([place]) 05/29/2016   Breast lesion 12/06/2015   Chicken pox    Complication of anesthesia 1999   urinary retention   Depression    in teh past- ok now   Diarrhea 03/18/2016   Fibromyalgia    GERD (gastroesophageal reflux disease)    Hyperglycemia 03/18/2016   Hyperlipidemia 08/07/2015   Left thyroid nodule 03/31/2013   Melanoma ([place])    right hip and knee   Migraine    Neck pain on right side 12/23/2012   Obesity    Pain 03/31/2013   Palpitations 08/10/2014   Shortness of breath    when anemic   Sinusitis 08/07/2015   Skin lesion of breast 12/06/2015   TIA (transient ischemic attack) 03/31/2013   UTI (lower urinary tract infection)     Patient Active Problem List   Diagnosis Date Noted   Hematemesis without nausea 08/17/2017   Abdominal pain, epigastric 08/17/2017   Regurgitation of food    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   Vasomotor symptoms due to menopause 11/23/2016   History of melanoma 10/28/2016   DJD (degenerative joint disease), cervical 10/25/2016   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   Primary osteoarthritis of both hands 10/25/2016   Primary osteoarthritis of left knee 10/25/2016   Fibromyalgia 10/25/2016   Allergic  rhinitis with a probable non-allergic component 10/24/2016   ANA positive 10/21/2016   Elevated sed rate 05/29/2016   Bipolar depression ([place]) 05/29/2016   Fever 71/69/6789   Other complicated headache syndrome 05/23/2016   Rash and nonspecific skin eruption 05/23/2016   Hyperglycemia 03/18/2016   Diarrhea 03/18/2016   Abdominal pain 03/18/2016   Perimenopausal 01/31/2016   Vitamin D deficiency 01/31/2016   Myalgia 01/31/2016   Skin lesion of breast 12/06/2015   Nausea and vomiting 08/12/2015   Dysphagia 08/12/2015   Peptic stricture of esophagus 08/12/2015   Hyperlipidemia 08/07/2015   Left foot pain 08/07/2015   Recurrent sinusitis 08/07/2015   Back pain 08/19/2014   Palpitations 08/10/2014   Pain 03/31/2013   Neck pain on right side 12/23/2012   Fibroids 07/04/2012   Menorrhagia 06/25/2012   Hypertriglyceridemia 04/23/2012   Annual physical exam 12/15/2011   Obesity 12/10/2010   Lower extremity edema 12/10/2010   Iron deficiency anemia 08/18/2009   GERD 08/18/2009    Past Surgical History:  Procedure Laterality Date   24 HOUR [place] STUDY N/A 04/12/2017   Procedure: 24 HOUR [place] STUDY WITH IMPEDIANCE;  Surgeon: Mauri Pole, MD;  [facility]: [facility];  Service: Endoscopy;  Laterality: N/A;   ABDOMINAL HYSTERECTOMY     BACK SURGERY     BILATERAL SALPINGECTOMY Bilateral 09/13/2012  Procedure: BILATERAL SALPINGECTOMY;  Surgeon: Lavonia Drafts, MD;  Location: [facility];  Service: Gynecology;  Laterality: Bilateral;   CHOLECYSTECTOMY     COLONOSCOPY     ESOPHAGEAL MANOMETRY N/A 04/12/2017   Procedure: ESOPHAGEAL MANOMETRY (EM);  Surgeon: Mauri Pole, MD;  [facility];  Service: Endoscopy;  Laterality: N/A;   LAPAROSCOPIC ASSISTED VAGINAL HYSTERECTOMY N/A 09/13/2012   Procedure: LAPAROSCOPIC ASSISTED VAGINAL HYSTERECTOMY;  Surgeon: Lavonia Drafts, MD;  Location: [facility];  Service: Gynecology;  Laterality: N/A;   MELANOMA EXCISION  2011    stage I and II   WISDOM TOOTH EXTRACTION  1989     OB History     Gravida  0   Para      Term      Preterm      AB      Living         SAB      IAB      Ectopic      Multiple      Live Births              Family History  Problem Relation Age of Onset   Colitis Father        crohn's   Heart disease Father        grandfather   Heart attack Father 23   Arthritis Father        s/p back surgery   Other Mother        neurological autoimmune disease, chorea in trunk   Eczema Mother    Asthma Mother    Arthritis Maternal Grandmother    Lupus Maternal Grandmother    Dementia Maternal Grandmother    CVA Maternal Grandmother    Arthritis Paternal Grandmother    Heart disease Paternal Grandmother        chf   Asthma Paternal Grandmother    Diabetes Paternal Grandfather    Kidney disease Paternal Grandfather    Hypertension Paternal Grandfather    Hyperlipidemia Paternal Grandfather    Cancer Maternal Grandfather        bone   Diabetes Unknown        grandfather   Irritable bowel syndrome Unknown    Kidney disease Unknown        grandfather   Arthritis Other    Cancer Other        breast   Hyperlipidemia Other    Mental illness Other    Emphysema Maternal Uncle    Colon cancer Neg Hx    Allergic rhinitis Neg Hx    Angioedema Neg Hx    Immunodeficiency Neg Hx    Urticaria Neg Hx     Social History   Tobacco Use   Smoking status: Never   Smokeless tobacco: Never   Tobacco comments:    never used tobacco  Vaping Use   Vaping Use: Never used  Substance Use Topics   Alcohol use: No    Alcohol/week: 0.0 standard drinks   Drug use: No    Home Medications Prior to Admission medications   Medication Sig Start Date End Date Taking? Authorizing Provider  buPROPion (WELLBUTRIN XL) 300 MG 24 hr tablet Take 300 mg by mouth daily.    [provider]  cetirizine (ZYRTEC) 10 MG tablet Take 1 tablet (10 mg total) by mouth daily. 10/27/16    Mosie Lukes, MD  cholecalciferol (VITAMIN D) 1000 units tablet Take 2,000 Units by mouth daily.    [provider]  gabapentin (NEURONTIN) 300 MG capsule Take 1 capsule (300 mg total) by mouth at bedtime. 09/06/17   Bo Merino, MD  loperamide (IMODIUM) 2 MG capsule Take by mouth as needed for diarrhea or loose stools.    [provider]  Multiple Vitamins-Minerals (MULTIVITAMIN ADULT PO) Take by mouth.    [provider]  ondansetron (ZOFRAN) 4 MG tablet Take 1 tablet (4 mg total) by mouth every 6 (six) hours as needed for nausea or vomiting. 08/17/17   Zehr, Janett Billow D, PA-C  pantoprazole (PROTONIX) 40 MG tablet TAKE 1 TABLET(40 MG) BY MOUTH TWICE DAILY 11/14/17   Mosie Lukes, MD  Probiotic Product (ACIDOPHILUS/BIFIDUS PO) Take 4 each by mouth daily.    [provider]  Vitamin D, Ergocalciferol, (DRISDOL) 50000 units CAPS capsule TAKE 1 CAPSULE BY MOUTH EVERY 7 DAYS- THEN TAKE OTC VITAMIN DAILY 2000 IU DAILY 08/31/17   Mosie Lukes, MD    Allergies    Other, Seroquel [quetiapine fumarate], Adhesive [tape], Aleve [naproxen sodium], Carbamazepine, Gabapentin, and Mold extract [trichophyton]  Review of Systems   Review of Systems  Constitutional:  Positive for diaphoresis. Negative for chills and fever.  Respiratory: Negative.    Cardiovascular: Negative.   Gastrointestinal:  Positive for abdominal pain, nausea and vomiting.  Genitourinary:  Positive for flank pain.  Skin: Negative.   Neurological: Negative.    Physical Exam Updated Vital Signs BP 91/76   Pulse 93   Temp 98.1 F (36.7 C)   Resp (!) 26   Ht 5\' 9"  (1.753 m)   Wt (!) 142 kg   LMP 08/13/2012   SpO2 94%   BMI 46.22 kg/m   Physical Exam Vitals and nursing note reviewed.  Constitutional:      General: [name] is not in acute distress.    Appearance: [name] is well-developed. [name] is obese. [name] is not ill-appearing.     Comments: Rocking back and forth, in pain  Pulmonary:      Effort: Pulmonary effort is normal. No respiratory distress.  Abdominal:     Tenderness: There is left CVA tenderness.  Musculoskeletal:        General: Normal range of motion.     Cervical back: Normal range of motion.  Skin:    General: Skin is warm and dry.  Neurological:     Mental Status: [name] is alert and oriented to person, place, and time.    ED Results / Procedures / Treatments   Labs (all labs ordered are listed, but only abnormal results are displayed) Labs Reviewed - No data to display Results for orders placed or performed during the hospital encounter of 05/13/21  CBC with Differential  Result Value Ref Range   WBC 11.9 (H) 4.0 - 10.5 K/uL   RBC 4.85 3.87 - 5.11 MIL/uL   Hemoglobin 14.0 12.0 - 15.0 g/dL   HCT 41.5 36.0 - 46.0 %   MCV 85.6 80.0 - 100.0 fL   MCH 28.9 26.0 - 34.0 pg   MCHC 33.7 30.0 - 36.0 g/dL   RDW 13.5 11.5 - 15.5 %   Platelets 315 150 - 400 K/uL   nRBC 0.0 0.0 - 0.2 %   Neutrophils Relative % 76 %   Neutro Abs 9.0 (H) 1.7 - 7.7 K/uL   Lymphocytes Relative 18 %   Lymphs Abs 2.2 0.7 - 4.0 K/uL   Monocytes Relative 5 %   Monocytes Absolute 0.6 0.1 - 1.0 K/uL   Eosinophils Relative 1 %  Eosinophils Absolute 0.1 0.0 - 0.5 K/uL   Basophils Relative 0 %   Basophils Absolute 0.1 0.0 - 0.1 K/uL   Immature Granulocytes 0 %   Abs Immature Granulocytes 0.03 0.00 - 0.07 K/uL  Basic metabolic panel  Result Value Ref Range   Sodium 137 135 - 145 mmol/L   Potassium 3.7 3.5 - 5.1 mmol/L   Chloride 104 98 - 111 mmol/L   CO2 22 22 - 32 mmol/L   Glucose, Bld 122 (H) 70 - 99 mg/dL   BUN 17 6 - 20 mg/dL   Creatinine, Ser 1.06 (H) 0.44 - 1.00 mg/dL   Calcium 9.3 8.9 - 10.3 mg/dL   GFR, Estimated >60 >60 mL/min   Anion gap 11 5 - 15    EKG None  Radiology No results found. CT Renal Stone Study  Result Date: 05/13/2021 CLINICAL DATA:  Left flank pain. EXAM: CT ABDOMEN AND PELVIS WITHOUT CONTRAST TECHNIQUE: Multidetector CT imaging of the  abdomen and pelvis was performed following the standard protocol without IV contrast. COMPARISON:  August 28, 2013 FINDINGS: Lower chest: No acute abnormality. Hepatobiliary: No focal liver abnormality is seen. Status post cholecystectomy. No biliary dilatation. Pancreas: Unremarkable. No pancreatic ductal dilatation or surrounding inflammatory changes. Spleen: Normal in size without focal abnormality. Adrenals/Urinary Tract: Adrenal glands are unremarkable. Kidneys are normal in size. A 9.1 cm x 6.6 cm cyst is seen within the mid left kidney. A 5 mm obstructing renal stone is noted within the proximal left ureter, just beyond the left UPJ. Mild left-sided hydronephrosis is present. Bladder is unremarkable. Stomach/Bowel: Stomach is within normal limits. Appendix appears normal. No evidence of bowel wall thickening, distention, or inflammatory changes. Vascular/Lymphatic: No significant vascular findings are present. No enlarged abdominal or pelvic lymph nodes. Reproductive: Status post hysterectomy. No adnexal masses. Other: No abdominal wall hernia or abnormality. No abdominopelvic ascites. Musculoskeletal: No acute or significant osseous findings. IMPRESSION: 1. 5 mm obstructing renal stone within the proximal left ureter, just beyond the left UPJ. 2. 9.1 cm x 6.6 cm left renal cyst. 3. Evidence of prior cholecystectomy and hysterectomy. Electronically Signed   By: Virgina Norfolk M.D.   On: 05/13/2021 01:02    Procedures Procedures   Medications Ordered in ED Medications  HYDROmorphone (DILAUDID) injection 2 mg (has no administration in time range)  ondansetron (ZOFRAN-ODT) disintegrating tablet 4 mg (has no administration in time range)    ED Course  I have reviewed the triage vital signs and the nursing notes.  Pertinent labs & imaging results that were available during my care of the patient were reviewed by me and considered in my medical decision making (see chart for details).    MDM  Rules/Calculators/A&P                           Patient to ED with sudden onset left flank to left abdomen pain, N, V. No history of stones.   No fever. Will get labs, CT renal, treat pain and reassess.   On reassessment the patient's pain and nausea continue without relief. CT shows a 5 mm proximal left ureteral stone. IV started.  Pain and nausea relieved with IV medications. Labs essentially unremarkable. No fever. [name] can be discharged home with urology referral. Strict return precautions discussed.   Patient aware of left renal cyst.   Final Clinical Impression(s) / ED Diagnoses Final diagnoses:  None   Left ureteral stone  Rx / DC  Orders ED Discharge Orders     None        Charlann Lange, Hershal Coria 05/13/21 0510    Maudie Flakes, MD 05/13/21 [phone]

## 2021-05-13 NOTE — ED Triage Notes (Signed)
Pt c/o sudden onset of left side flank pain that started approx 1 hour PTA. Pt vomiting in triage. Pt states she feels like something is ripping inside. Denies urinary problems.

## 2021-05-13 NOTE — Discharge Instructions (Signed)
Return to the ED if you develop any fever, uncontrolled pain or vomiting. Follow up with urology if pain is no better in 3 days.

## 2021-05-24 ENCOUNTER — Encounter: Payer: Self-pay | Admitting: Urology

## 2021-05-24 ENCOUNTER — Ambulatory Visit (INDEPENDENT_AMBULATORY_CARE_PROVIDER_SITE_OTHER): Payer: BC Managed Care – PPO | Admitting: Urology

## 2021-05-24 ENCOUNTER — Other Ambulatory Visit: Payer: Self-pay

## 2021-05-24 VITALS — BP 132/82 | HR 76 | Temp 97.9°F

## 2021-05-24 DIAGNOSIS — N281 Cyst of kidney, acquired: Secondary | ICD-10-CM | POA: Diagnosis not present

## 2021-05-24 DIAGNOSIS — N201 Calculus of ureter: Secondary | ICD-10-CM | POA: Diagnosis not present

## 2021-05-24 DIAGNOSIS — R351 Nocturia: Secondary | ICD-10-CM | POA: Diagnosis not present

## 2021-05-24 LAB — URINALYSIS, ROUTINE W REFLEX MICROSCOPIC
Bilirubin, UA: NEGATIVE
Glucose, UA: NEGATIVE
Ketones, UA: NEGATIVE
Leukocytes,UA: NEGATIVE
Nitrite, UA: NEGATIVE
Protein,UA: NEGATIVE
RBC, UA: NEGATIVE
Specific Gravity, UA: 1.015 (ref 1.005–1.030)
Urobilinogen, Ur: 0.2 mg/dL (ref 0.2–1.0)
pH, UA: 5.5 (ref 5.0–7.5)

## 2021-05-24 NOTE — Progress Notes (Signed)
05/24/2021 2:27 PM   [name] [birth year] [id number]  Referring provider: Sharilyn Sites, [address] [place],  [place] [address]  No chief complaint on file.   HPI:  New patient-  1) left ureteral stone-patient underwent CT scan October 2022 which revealed a 5 mm left proximal stone (?  Visible, SSD 16 cm).  No other stones.  She has not seen a stone pass.  She has not had significant pain or uncontrolled pain.  She did have some left lower quadrant discomfort.  She has some chronic back issues and pain.  2) left lower pole cyst-a 9 cm left lower pole cyst was noted on CT scan October 2022.  This cyst had a septation and appeared similar on MRI of the L-spine in 2016. She has known about the kidney cyst since 1990's after her first back surgery.   3) nocturia - she has noc x 2- 6. She has LE edema and OSA. She just got a CPAP machine. She has no kids. No incontinence.    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 She had a partial hx.   PMH: Past Medical History:  Diagnosis Date   Abdominal pain 03/18/2016   Allergy    Anemia    in the past   Anxiety    Anxiety disorder    Bipolar depression ([place]) 05/29/2016   Breast lesion 12/06/2015   Chicken pox    Complication of anesthesia 1999   urinary retention   Depression    in teh past- ok now   Diarrhea 03/18/2016   Fibromyalgia    GERD (gastroesophageal reflux disease)    Hyperglycemia 03/18/2016   Hyperlipidemia 08/07/2015   Left thyroid nodule 03/31/2013   Melanoma ([place])    right hip and knee   Migraine    Neck pain on right side 12/23/2012   Obesity    Pain 03/31/2013   Palpitations 08/10/2014   Shortness of breath    when anemic   Sinusitis 08/07/2015   Skin lesion of breast 12/06/2015   TIA (transient ischemic attack) 03/31/2013   UTI (lower urinary tract infection)     Surgical History: Past Surgical History:  Procedure Laterality Date   36 HOUR [place] STUDY N/A 04/12/2017   Procedure: 24 HOUR  [place];  Surgeon: Mauri Pole, MD;  [facility];  Service: Endoscopy;  Laterality: N/A;   ABDOMINAL HYSTERECTOMY     BACK SURGERY     BILATERAL SALPINGECTOMY Bilateral 09/13/2012   Procedure: BILATERAL SALPINGECTOMY;  Surgeon: Lavonia Drafts, MD;  Location: [facility];  Service: Gynecology;  Laterality: Bilateral;   CHOLECYSTECTOMY     COLONOSCOPY     ESOPHAGEAL MANOMETRY N/A 04/12/2017   Procedure: ESOPHAGEAL MANOMETRY (EM);  Surgeon: Mauri Pole, MD;  [facility];  Service: Endoscopy;  Laterality: N/A;   LAPAROSCOPIC ASSISTED VAGINAL HYSTERECTOMY N/A 09/13/2012   Procedure: LAPAROSCOPIC ASSISTED VAGINAL HYSTERECTOMY;  Surgeon: Lavonia Drafts, MD;  Location: [facility];  Service: Gynecology;  Laterality: N/A;   MELANOMA EXCISION  2011   stage I and II   WISDOM TOOTH EXTRACTION  1989    Home Medications:  Allergies as of 05/24/2021       Reactions   Other Dermatitis   Band- Aid adhesive Band- Aid adhesive Band- Aid adhesive   Seroquel [quetiapine Fumarate] Nausea And Vomiting   Nightmares and vomiting    Adhesive [tape] Dermatitis   Band- Aid adhesive   Aleve [naproxen Sodium]  Makes hands, legs, and feet swell   Carbamazepine    Hallucinations and dizziness   Gabapentin Other (See Comments)   Weight gain   Mold Extract [trichophyton]         Medication List        Accurate as of May 24, 2021  2:27 PM. If you have any questions, ask your nurse or doctor.          STOP taking these medications    ACIDOPHILUS/BIFIDUS PO Stopped by: Festus Aloe, MD   buPROPion 300 MG 24 hr tablet Commonly known as: WELLBUTRIN XL Stopped by: Festus Aloe, MD   cholecalciferol 1000 units tablet Commonly known as: VITAMIN D Stopped by: Festus Aloe, MD   gabapentin 300 MG capsule Commonly known as: NEURONTIN Stopped by: Festus Aloe, MD   loperamide 2 MG capsule Commonly known as:  IMODIUM Stopped by: Festus Aloe, MD   MULTIVITAMIN ADULT PO Stopped by: Festus Aloe, MD   ondansetron 4 MG tablet Commonly known as: Zofran Stopped by: Festus Aloe, MD   Vitamin D (Ergocalciferol) 1.25 MG (50000 UNIT) Caps capsule Commonly known as: DRISDOL Stopped by: Festus Aloe, MD       TAKE these medications    cetirizine 10 MG tablet Commonly known as: ZYRTEC Take 1 tablet (10 mg total) by mouth daily.   oxyCODONE-acetaminophen 5-325 MG tablet Commonly known as: PERCOCET/ROXICET Take 1-2 tablets by mouth every 4 (four) hours as needed for severe pain.   pantoprazole 40 MG tablet Commonly known as: PROTONIX TAKE 1 TABLET(40 MG) BY MOUTH TWICE DAILY   promethazine 25 MG tablet Commonly known as: PHENERGAN Take 1 tablet (25 mg total) by mouth every 6 (six) hours as needed for nausea or vomiting.   tamsulosin 0.4 MG Caps capsule Commonly known as: Flomax Take 1 capsule (0.4 mg total) by mouth daily after breakfast.        Allergies:  Allergies  Allergen Reactions   Other Dermatitis    Band- Aid adhesive Band- Aid adhesive Band- Aid adhesive   Seroquel [Quetiapine Fumarate] Nausea And Vomiting    Nightmares and vomiting    Adhesive [Tape] Dermatitis    Band- Aid adhesive   Aleve [Naproxen Sodium]     Makes hands, legs, and feet swell   Carbamazepine     Hallucinations and dizziness   Gabapentin Other (See Comments)    Weight gain   Mold Extract [Trichophyton]     Family History: Family History  Problem Relation Age of Onset   Colitis Father        crohn's   Heart disease Father        grandfather   Heart attack Father 43   Arthritis Father        s/p back surgery   Other Mother        neurological autoimmune disease, chorea in trunk   Eczema Mother    Asthma Mother    Arthritis Maternal Grandmother    Lupus Maternal Grandmother    Dementia Maternal Grandmother    CVA Maternal Grandmother    Arthritis Paternal  Grandmother    Heart disease Paternal Grandmother        chf   Asthma Paternal Grandmother    Diabetes Paternal Grandfather    Kidney disease Paternal Grandfather    Hypertension Paternal Grandfather    Hyperlipidemia Paternal Grandfather    Cancer Maternal Grandfather        bone   Diabetes Unknown  grandfather   Irritable bowel syndrome Unknown    Kidney disease Unknown        grandfather   Arthritis Other    Cancer Other        breast   Hyperlipidemia Other    Mental illness Other    Emphysema Maternal Uncle    Colon cancer Neg Hx    Allergic rhinitis Neg Hx    Angioedema Neg Hx    Immunodeficiency Neg Hx    Urticaria Neg Hx     Social History:  reports that she has never smoked. She has never used smokeless tobacco. She reports that she does not drink alcohol and does not use drugs.   Physical Exam: BP 132/82   Pulse 76   Temp 97.9 F (36.6 C)   LMP 08/13/2012   Constitutional:  Alert and oriented, No acute distress. HEENT: [place] AT, moist mucus membranes.  Trachea midline, no masses. Cardiovascular: No clubbing, cyanosis, or edema. Respiratory: Normal respiratory effort, no increased work of breathing. GI: Abdomen is soft, nontender, nondistended, no abdominal masses GU: No CVA tenderness Skin: No rashes, bruises or suspicious lesions. Neurologic: Grossly intact, no focal deficits, moving all 4 extremities. Psychiatric: Normal mood and affect.  Laboratory Data: Lab Results  Component Value Date   WBC 11.9 (H) 05/13/2021   HGB 14.0 05/13/2021   HCT 41.5 05/13/2021   MCV 85.6 05/13/2021   PLT 315 05/13/2021    Lab Results  Component Value Date   CREATININE 1.06 (H) 05/13/2021    No results found for: PSA  No results found for: TESTOSTERONE  Lab Results  Component Value Date   HGBA1C 5.3 03/18/2016    Urinalysis    Component Value Date/Time   COLORURINE YELLOW 03/21/2017 1500   APPEARANCEUR HAZY (A) 03/21/2017 1500   LABSPEC >1.030 (H)  03/21/2017 1500   LABSPEC 1.030 08/11/2009 1102   PHURINE 6.0 03/21/2017 1500   GLUCOSEU NEGATIVE 03/21/2017 1500   GLUCOSEU NEGATIVE 03/18/2016 1238   HGBUR NEGATIVE 03/21/2017 1500   BILIRUBINUR NEGATIVE 03/21/2017 1500   BILIRUBINUR Large 08/19/2014 1022   BILIRUBINUR Negative 08/11/2009 1102   KETONESUR NEGATIVE 03/21/2017 1500   PROTEINUR NEGATIVE 03/21/2017 1500   UROBILINOGEN 0.2 03/18/2016 1238   NITRITE NEGATIVE 03/21/2017 1500   LEUKOCYTESUR NEGATIVE 03/21/2017 1500   LEUKOCYTESUR Negative 08/11/2009 1102    Lab Results  Component Value Date   MUCUS Small 08/11/2009   BACTERIA Moderate 08/11/2009    Pertinent Imaging: CT scan 2022 and MRI 2016 - images reviewed   Results for orders placed during the hospital encounter of 05/13/21  CT Renal Stone Study  Narrative CLINICAL DATA:  Left flank pain.  EXAM: CT ABDOMEN AND PELVIS WITHOUT CONTRAST  TECHNIQUE: Multidetector CT imaging of the abdomen and pelvis was performed following the standard protocol without IV contrast.  COMPARISON:  August 28, 2013  FINDINGS: Lower chest: No acute abnormality.  Hepatobiliary: No focal liver abnormality is seen. Status post cholecystectomy. No biliary dilatation.  Pancreas: Unremarkable. No pancreatic ductal dilatation or surrounding inflammatory changes.  Spleen: Normal in size without focal abnormality.  Adrenals/Urinary Tract: Adrenal glands are unremarkable. Kidneys are normal in size. A 9.1 cm x 6.6 cm cyst is seen within the mid left kidney. A 5 mm obstructing renal stone is noted within the proximal left ureter, just beyond the left UPJ. Mild left-sided hydronephrosis is present. Bladder is unremarkable.  Stomach/Bowel: Stomach is within normal limits. Appendix appears normal. No evidence of bowel wall thickening,  distention, or inflammatory changes.  Vascular/Lymphatic: No significant vascular findings are present. No enlarged abdominal or pelvic lymph  nodes.  Reproductive: Status post hysterectomy. No adnexal masses.  Other: No abdominal wall hernia or abnormality. No abdominopelvic ascites.  Musculoskeletal: No acute or significant osseous findings.  IMPRESSION: 1. 5 mm obstructing renal stone within the proximal left ureter, just beyond the left UPJ. 2. 9.1 cm x 6.6 cm left renal cyst. 3. Evidence of prior cholecystectomy and hysterectomy.   Electronically Signed By: Virgina Norfolk M.D. On: 05/13/2021 01:02   Assessment & Plan:    1. Left ureteral stone -we discussed the left proximal stone.  We went over the nature risk and benefits of continued stone passage, left extracorporeal shockwave lithotripsy or left ureteroscopy with laser lithotripsy and stent.  All questions answered.  She really favors continued stone passage.  We will plan to get a KUB next week and see her back to assess her symptoms. - Urinalysis, Routine w reflex microscopic  2. Nocturia -may be related to overactive bladder but could be related to her sleep apnea.  We will see if it improves as she uses her CPAP as it certainly does for many patients.  3.  Left renal cyst-this appears stable over time.  Benign.  No follow-ups on file.  Festus Aloe, MD  [facility]  [address] [place], [place] [address] [phone]

## 2021-05-24 NOTE — Progress Notes (Signed)
Urological Symptom Review  Patient is experiencing the following symptoms: Frequent urination Hard to postpone urination Get up at night to urinate   Review of Systems  Gastrointestinal (upper)  : Nausea Vomiting Indigestion/heartburn  Gastrointestinal (lower) : Negative for lower GI symptoms  Constitutional : Night Sweats Fatigue  Skin: Negative for skin symptoms  Eyes: Negative for eye symptoms  Ear/Nose/Throat : Negative for Ear/Nose/Throat symptoms  Hematologic/Lymphatic: Negative for Hematologic/Lymphatic symptoms  Cardiovascular : Leg swelling  Respiratory : Shortness of breath  Endocrine: Excessive thirst  Musculoskeletal: Back pain Joint pain  Neurological: Negative for neurological symptoms  Psychologic: Negative for psychiatric symptoms

## 2021-06-21 ENCOUNTER — Ambulatory Visit: Payer: BC Managed Care – PPO | Admitting: Urology

## 2021-06-28 ENCOUNTER — Other Ambulatory Visit (HOSPITAL_COMMUNITY): Payer: Self-pay | Admitting: Family Medicine

## 2021-06-28 ENCOUNTER — Other Ambulatory Visit: Payer: Self-pay | Admitting: Family Medicine

## 2021-06-28 DIAGNOSIS — R600 Localized edema: Secondary | ICD-10-CM

## 2021-06-29 ENCOUNTER — Other Ambulatory Visit: Payer: Self-pay

## 2021-06-29 ENCOUNTER — Ambulatory Visit (HOSPITAL_COMMUNITY)
Admission: RE | Admit: 2021-06-29 | Discharge: 2021-06-29 | Disposition: A | Discharge status: Home / Self Care | Payer: BC Managed Care – PPO | Source: Ambulatory Visit | Attending: Family Medicine | Admitting: Family Medicine

## 2021-06-29 DIAGNOSIS — R600 Localized edema: Secondary | ICD-10-CM | POA: Insufficient documentation

## 2021-06-29 DIAGNOSIS — M79605 Pain in left leg: Secondary | ICD-10-CM | POA: Diagnosis not present

## 2021-06-30 ENCOUNTER — Ambulatory Visit (HOSPITAL_COMMUNITY): Payer: BC Managed Care – PPO

## 2021-07-02 ENCOUNTER — Other Ambulatory Visit (HOSPITAL_COMMUNITY): Payer: BC Managed Care – PPO

## 2021-07-02 ENCOUNTER — Encounter (HOSPITAL_COMMUNITY): Payer: Self-pay

## 2021-07-06 DIAGNOSIS — M25569 Pain in unspecified knee: Secondary | ICD-10-CM | POA: Diagnosis not present

## 2021-07-06 DIAGNOSIS — Z6841 Body Mass Index (BMI) 40.0 and over, adult: Secondary | ICD-10-CM | POA: Diagnosis not present

## 2021-07-06 DIAGNOSIS — M353 Polymyalgia rheumatica: Secondary | ICD-10-CM | POA: Diagnosis not present

## 2021-10-01 DIAGNOSIS — Z1231 Encounter for screening mammogram for malignant neoplasm of breast: Secondary | ICD-10-CM | POA: Diagnosis not present

## 2021-11-11 ENCOUNTER — Other Ambulatory Visit (HOSPITAL_COMMUNITY)
Admission: RE | Admit: 2021-11-11 | Discharge: 2021-11-11 | Disposition: A | Discharge status: Home / Self Care | Payer: BC Managed Care – PPO | Source: Ambulatory Visit | Attending: Cardiology | Admitting: Cardiology

## 2021-11-11 ENCOUNTER — Ambulatory Visit (INDEPENDENT_AMBULATORY_CARE_PROVIDER_SITE_OTHER): Payer: BC Managed Care – PPO | Admitting: Cardiology

## 2021-11-11 ENCOUNTER — Encounter: Payer: Self-pay | Admitting: Cardiology

## 2021-11-11 VITALS — BP 140/90 | HR 95 | Ht 68.0 in | Wt 320.0 lb

## 2021-11-11 DIAGNOSIS — R0600 Dyspnea, unspecified: Secondary | ICD-10-CM

## 2021-11-11 DIAGNOSIS — R0602 Shortness of breath: Secondary | ICD-10-CM

## 2021-11-11 DIAGNOSIS — R079 Chest pain, unspecified: Secondary | ICD-10-CM

## 2021-11-11 DIAGNOSIS — Z8249 Family history of ischemic heart disease and other diseases of the circulatory system: Secondary | ICD-10-CM | POA: Diagnosis not present

## 2021-11-11 DIAGNOSIS — E7800 Pure hypercholesterolemia, unspecified: Secondary | ICD-10-CM | POA: Diagnosis not present

## 2021-11-11 LAB — BASIC METABOLIC PANEL
Anion gap: 8 (ref 5–15)
BUN: 16 mg/dL (ref 6–20)
CO2: 26 mmol/L (ref 22–32)
Calcium: 9.5 mg/dL (ref 8.9–10.3)
Chloride: 104 mmol/L (ref 98–111)
Creatinine, Ser: 0.89 mg/dL (ref 0.44–1.00)
GFR, Estimated: >60 mL/min (ref >60)
Glucose, Bld: 103 mg/dL — ABNORMAL HIGH (ref 70–99)
Potassium: 4.1 mmol/L (ref 3.5–5.1)
Sodium: 138 mmol/L (ref 135–145)

## 2021-11-11 NOTE — Assessment & Plan Note (Signed)
If coronary plaque is present, Crestor. ?

## 2021-11-11 NOTE — Assessment & Plan Note (Signed)
Checking echocardiogram.  Could be deconditioning as she thinks it is. ?

## 2021-11-11 NOTE — Patient Instructions (Signed)
Medication Instructions:  ?Your physician recommends that you continue on your current medications as directed. Please refer to the Current Medication list given to you today. ? ? ?Labwork: ? ?BMET today at [facility] ? ? ?Testing/Procedures: ?Your physician has requested that you have an echocardiogram. Echocardiography is a painless test that uses sound waves to create images of your heart. It provides your doctor with information about the size and shape of your heart and how well your heart?s chambers and valves are working. This procedure takes approximately one hour. There are no restrictions for this procedure. ? ?Your physician has requested that you have cardiac CT. Cardiac computed tomography (CT) is a painless test that uses an x-ray machine to take clear, detailed pictures of your heart. For further information please visit [email or web address]. Please follow instruction sheet as given. ? ? ? ?Follow-Up ?As needed ? ?Any Other Special Instructions Will Be Listed Below (If Applicable). ? ?If you need a refill on your cardiac medications before your next appointment, please call your pharmacy. ? ?

## 2021-11-11 NOTE — Assessment & Plan Note (Signed)
We will go ahead and proceed with coronary CT scan at [facility] with possible FFR analysis.  Chest discomfort evaluation. ?

## 2021-11-11 NOTE — Assessment & Plan Note (Signed)
Father had major heart attack and died at age 52.  Coronary CT scan will be helpful.  If plaque is present, further prevention with Crestor and aspirin for instance. ?

## 2021-11-11 NOTE — Progress Notes (Signed)
?Cardiology Office Note:   ? ?Date:  11/11/2021  ? ?ID:  [name], DOB June 12, 1971, MRN [id number] ? ?PCP:  Sharilyn Sites, MD ?  ?[facility] Providers ?Cardiologist:  None    ? ?Referring MD: Sharilyn Sites, MD  ? ? ?History of Present Illness:   ? ?[name] is a 51 y.o. female here for the evaluation of chest pain at the request of Dr. Hilma Favors of [facility].  She was previously seen in our clinic in 2019.  It has been over 3 years.  Previously on ECG, normal sinus rhythm with nonspecific T wave changes. ? ?She was previously having some gastrointestinal problems over the past, vomiting on a daily basis previously.  She then developed some sharp chest pains.  She was diagnosed with an unspecified autoimmune disease.  Set her ESR was elevated and rheumatoid factor was positive.  Has previously seen rheumatology. ? ?Had baseline chronic exertional dyspnea.  CT of her abdomen showed cysts on her liver kidneys and ovaries.  Minor ankle swelling. ? ?Family history is important that her father had an MI at age 2.  He was a smoker.  Father has COPD. ? ?No smoker no alcohol ? ?Previously her chest pain was thought to be more GI related, esophageal inflammation due to repeated vomiting perhaps. ? ?Prior LDL 146 ?ECHO 2012 was normal ? ?Gall bladder removed.  ? ?Winded from car to here. She feels out of shape due to weight. Occasional palpitations. Grab and squeeze the heart/chest pain, moderate.  ? ?Past Medical History:  ?Diagnosis Date  ? Abdominal pain 03/18/2016  ? Allergy   ? Anemia   ? in the past  ? Anxiety   ? Anxiety disorder   ? Bipolar depression ([place]) 05/29/2016  ? Breast lesion 12/06/2015  ? Chicken pox   ? Complication of anesthesia 1999  ? urinary retention  ? Depression   ? in teh past- ok now  ? Diarrhea 03/18/2016  ? Fibromyalgia   ? GERD (gastroesophageal reflux disease)   ? Hyperglycemia 03/18/2016  ? Hyperlipidemia 08/07/2015  ? Left thyroid nodule 03/31/2013  ? Melanoma ([place])    ? right hip and knee  ? Migraine   ? Neck pain on right side 12/23/2012  ? Obesity   ? Pain 03/31/2013  ? Palpitations 08/10/2014  ? Shortness of breath   ? when anemic  ? Sinusitis 08/07/2015  ? Skin lesion of breast 12/06/2015  ? TIA (transient ischemic attack) 03/31/2013  ? UTI (lower urinary tract infection)   ? ? ?Past Surgical History:  ?Procedure Laterality Date  ? 63 HOUR [place] STUDY N/A 04/12/2017  ? Procedure: [place];  Surgeon: Mauri Pole, MD;  Location: [facility];  Service: Endoscopy;  Laterality: N/A;  ? ABDOMINAL HYSTERECTOMY    ? BACK SURGERY    ? BILATERAL SALPINGECTOMY Bilateral 09/13/2012  ? Procedure: BILATERAL SALPINGECTOMY;  Surgeon: Lavonia Drafts, MD;  Location: [facility];  Service: Gynecology;  Laterality: Bilateral;  ? CHOLECYSTECTOMY    ? COLONOSCOPY    ? ESOPHAGEAL MANOMETRY N/A 04/12/2017  ? Procedure: ESOPHAGEAL MANOMETRY (EM);  Surgeon: Mauri Pole, MD;  [facility];  Service: Endoscopy;  Laterality: N/A;  ? LAPAROSCOPIC ASSISTED VAGINAL HYSTERECTOMY N/A 09/13/2012  ? Procedure: LAPAROSCOPIC ASSISTED VAGINAL HYSTERECTOMY;  Surgeon: Lavonia Drafts, MD;  Location: [facility];  Service: Gynecology;  Laterality: N/A;  ? MELANOMA EXCISION  2011  ? stage I and II  ? WISDOM  TOOTH EXTRACTION  1989  ? ? ?Current Medications: ?Current Meds  ?Medication Sig  ? amitriptyline (ELAVIL) 25 MG tablet Take by mouth.  ? celecoxib (CELEBREX) 200 MG capsule Take by mouth.  ? pantoprazole (PROTONIX) 40 MG tablet TAKE 1 TABLET(40 MG) BY MOUTH TWICE DAILY  ? rOPINIRole (REQUIP) 0.25 MG tablet Take 0.25 mg by mouth 2 (two) times daily.  ? ?Current Facility-Administered Medications for the 11/11/21 encounter (Office Visit) with Jake Bathe, MD  ?Medication  ? 0.9 %  sodium chloride infusion  ?  ? ?Allergies:   Other, Seroquel [quetiapine fumarate], Adhesive [tape], Aleve [naproxen sodium], Carbamazepine, Gabapentin, and Mold extract [trichophyton]  ? ?Social  History  ? ?Socioeconomic History  ? Marital status: Married  ?  Spouse name: Not on file  ? Number of children: Not on file  ? Years of education: Not on file  ? Highest education level: Not on file  ?Occupational History  ? Occupation: unemployed  ?Tobacco Use  ? Smoking status: Never  ? Smokeless tobacco: Never  ? Tobacco comments:  ?  never used tobacco  ?Vaping Use  ? Vaping Use: Never used  ?Substance and Sexual Activity  ? Alcohol use: No  ?  Alcohol/week: 0.0 standard drinks  ? Drug use: No  ? Sexual activity: Yes  ?  Birth control/protection: None  ?Other Topics Concern  ? Not on file  ?Social History Narrative  ? Daily caffeine  ? ?Social Determinants of Health  ? ?Financial Resource Strain: Not on file  ?Food Insecurity: Not on file  ?Transportation Needs: Not on file  ?Physical Activity: Not on file  ?Stress: Not on file  ?Social Connections: Not on file  ?  ? ?Family History: ?The patient's family history includes Arthritis in her father, maternal grandmother, paternal grandmother, and another family member; Asthma in her mother and paternal grandmother; CVA in her maternal grandmother; Cancer in her maternal grandfather and another family member; Colitis in her father; Dementia in her maternal grandmother; Diabetes in her paternal grandfather and unknown relative; Eczema in her mother; Emphysema in her maternal uncle; Heart attack (age of onset: 71) in her father; Heart disease in her father and paternal grandmother; Hyperlipidemia in her paternal grandfather and another family member; Hypertension in her paternal grandfather; Irritable bowel syndrome in her unknown relative; Kidney disease in her paternal grandfather and unknown relative; Lupus in her maternal grandmother; Mental illness in an other family member; Other in her mother. There is no history of Colon cancer, Allergic rhinitis, Angioedema, Immunodeficiency, or Urticaria. ? ?ROS:   ?Please see the history of present illness.    ?No fevers  chills all other systems reviewed and are negative. ? ?EKGs/Labs/Other Studies Reviewed:   ? ?The following studies were reviewed today: ?No DVT on vascular ultrasound. ? ?EKG:  EKG is  ordered today.  The ekg ordered today demonstrates sinus rhythm 88 nonspecific T wave changes ? ?Recent Labs: ?05/13/2021: BUN 17; Creatinine, Ser 1.06; Hemoglobin 14.0; Platelets 315; Potassium 3.7; Sodium 137  ?Recent Lipid Panel ?   ?Component Value Date/Time  ? CHOL 210 (H) 07/05/2016 1025  ? TRIG 204.0 (H) 07/05/2016 1025  ? HDL 41.30 07/05/2016 1025  ? CHOLHDL 5 07/05/2016 1025  ? VLDL 40.8 (H) 07/05/2016 1025  ? LDLCALC 114 (H) 09/26/2013 1524  ? LDLDIRECT 146.0 07/05/2016 1025  ? ? ? ?Risk Assessment/Calculations:   ? ? ?    ? ?   ? ?Physical Exam:   ? ?VS:  BP 140/90   Pulse 95   Ht $R'5\' 8"'nA$  (1.727 m)   Wt (!) 320 lb (145.2 kg)   LMP 08/13/2012   SpO2 95%   BMI 48.66 kg/m?    ? ?Wt Readings from Last 3 Encounters:  ?11/11/21 (!) 320 lb (145.2 kg)  ?05/13/21 (!) 313 lb (142 kg)  ?03/15/21 (!) 313 lb 0.9 oz (142 kg)  ?  ? ?GEN:  Well nourished, well developed in no acute distress ?HEENT: Normal ?NECK: No JVD; No carotid bruits ?LYMPHATICS: No lymphadenopathy ?CARDIAC: RRR, no murmurs, no rubs, gallops ?RESPIRATORY:  Clear to auscultation without rales, wheezing or rhonchi  ?ABDOMEN: Soft, non-tender, non-distended ?MUSCULOSKELETAL:  No edema; No deformity  ?SKIN: Warm and dry ?NEUROLOGIC:  Alert and oriented x 3 ?PSYCHIATRIC:  Normal affect  ? ?ASSESSMENT:   ? ?1. Dyspnea, unspecified type   ?2. Family history of early CAD   ?3. Pure hypercholesterolemia   ?4. Chest pain, unspecified type   ?5. Shortness of breath   ? ?PLAN:   ? ?In order of problems listed above: ? ?Chest pain of uncertain etiology ?We will go ahead and proceed with coronary CT scan at [facility] with possible FFR analysis.  Chest discomfort evaluation. ? ?Family history of early CAD ?Father had major heart attack and died at age 71.  Coronary CT scan  will be helpful.  If plaque is present, further prevention with Crestor and aspirin for instance. ? ?Hyperlipidemia ?If coronary plaque is present, Crestor. ? ?Dyspnea ?Checking echocardiogram.  Could be de

## 2021-11-15 ENCOUNTER — Telehealth: Payer: Self-pay | Admitting: Cardiology

## 2021-11-15 NOTE — Telephone Encounter (Signed)
Pt c/o medication issue: ? ?1. Name of Medication: metoprolol ? ?2. How are you currently taking this medication (dosage and times per day)? Not currently taking ? ?3. Are you having a reaction (difficulty breathing--STAT)? no ? ?4. What is your medication issue? Patient states the medication was not yet called in for her CT. ?

## 2021-11-16 MED ORDER — METOPROLOL TARTRATE 100 MG PO TABS: ORAL_TABLET | ORAL | 0 refills | Status: DC

## 2021-11-16 NOTE — Telephone Encounter (Signed)
Medication for CT scan sent to [facility].  ?

## 2021-11-25 ENCOUNTER — Ambulatory Visit (HOSPITAL_COMMUNITY)
Admission: RE | Admit: 2021-11-25 | Discharge: 2021-11-25 | Disposition: A | Discharge status: Home / Self Care | Payer: BC Managed Care – PPO | Source: Ambulatory Visit | Attending: Cardiology | Admitting: Cardiology

## 2021-11-25 DIAGNOSIS — R0609 Other forms of dyspnea: Secondary | ICD-10-CM | POA: Diagnosis not present

## 2021-11-25 DIAGNOSIS — R0600 Dyspnea, unspecified: Secondary | ICD-10-CM | POA: Diagnosis not present

## 2021-11-25 LAB — ECHOCARDIOGRAM COMPLETE
Area-P 1/2: 3.08 cm2
S' Lateral: 2.6 cm

## 2021-11-25 MED ORDER — PERFLUTREN LIPID MICROSPHERE
1.0000 mL | INTRAVENOUS | Status: AC | PRN
  Administered 2021-11-25: 2 mL via INTRAVENOUS
  Filled 2021-11-25: qty 10

## 2021-11-25 NOTE — Progress Notes (Signed)
*  PRELIMINARY RESULTS* ?Echocardiogram ?2D Echocardiogram has been performed with Definity. ? ?Samuel Germany ?11/25/2021, 12:28 PM ?

## 2021-11-29 ENCOUNTER — Telehealth (HOSPITAL_COMMUNITY): Payer: Self-pay | Admitting: *Deleted

## 2021-11-29 NOTE — Telephone Encounter (Signed)
Reaching out to patient to offer assistance regarding upcoming cardiac imaging study; pt verbalizes understanding of appt date/time, parking situation and where to check in, pre-test NPO status and medications ordered, and verified current allergies; name and call back number provided for further questions should they arise ? ?Gordy Clement RN Navigator Cardiac Imaging ?[facility] ?[phone] office ?[phone] cell ? ?Patient to take '100mg'$  two hours prior to her cardiac CT scan. She is aware to arrive at 9am. ?

## 2021-11-29 NOTE — Telephone Encounter (Signed)
Patient calling to cancel her cardiac CT scan because she cannot afford it.  She was encouraged to reach back out to Dr. Kingsley Plan office if she needed further evaluation of her symptoms.  ? ?Gordy Clement RN Navigator Cardiac Imaging ?[facility] ?[phone] Office ?[phone] Cell  ?

## 2021-11-30 ENCOUNTER — Ambulatory Visit (HOSPITAL_COMMUNITY): Admission: RE | Admit: 2021-11-30 | Payer: BC Managed Care – PPO | Source: Ambulatory Visit

## 2022-01-04 DIAGNOSIS — M5090 Cervical disc disorder, unspecified, unspecified cervical region: Secondary | ICD-10-CM | POA: Diagnosis not present

## 2022-01-04 DIAGNOSIS — Z6841 Body Mass Index (BMI) 40.0 and over, adult: Secondary | ICD-10-CM | POA: Diagnosis not present

## 2022-02-11 ENCOUNTER — Encounter (HOSPITAL_COMMUNITY): Payer: Self-pay

## 2022-08-24 DIAGNOSIS — Z6841 Body Mass Index (BMI) 40.0 and over, adult: Secondary | ICD-10-CM | POA: Diagnosis not present

## 2022-08-24 DIAGNOSIS — K219 Gastro-esophageal reflux disease without esophagitis: Secondary | ICD-10-CM | POA: Diagnosis not present

## 2022-08-24 DIAGNOSIS — F319 Bipolar disorder, unspecified: Secondary | ICD-10-CM | POA: Diagnosis not present

## 2022-08-24 DIAGNOSIS — M47812 Spondylosis without myelopathy or radiculopathy, cervical region: Secondary | ICD-10-CM | POA: Diagnosis not present

## 2022-08-26 ENCOUNTER — Encounter: Payer: BC Managed Care – PPO | Admitting: Obstetrics and Gynecology

## 2022-08-29 ENCOUNTER — Telehealth: Payer: Self-pay | Admitting: Urology

## 2022-08-29 ENCOUNTER — Ambulatory Visit (INDEPENDENT_AMBULATORY_CARE_PROVIDER_SITE_OTHER): Payer: BC Managed Care – PPO | Admitting: Urology

## 2022-08-29 ENCOUNTER — Encounter: Payer: Self-pay | Admitting: Urology

## 2022-08-29 VITALS — BP 172/104 | HR 92 | Ht 68.0 in | Wt 326.0 lb

## 2022-08-29 DIAGNOSIS — R350 Frequency of micturition: Secondary | ICD-10-CM

## 2022-08-29 DIAGNOSIS — N200 Calculus of kidney: Secondary | ICD-10-CM | POA: Insufficient documentation

## 2022-08-29 DIAGNOSIS — N3941 Urge incontinence: Secondary | ICD-10-CM

## 2022-08-29 LAB — URINALYSIS
Bilirubin, UA: NEGATIVE
Blood, UA: NEGATIVE
Glucose, UA: NEGATIVE mg/dL
Ketones, POC UA: NEGATIVE mg/dL
Nitrite, UA: NEGATIVE
Protein Ur, POC: 30 mg/dL — AB
Spec Grav, UA: 1.02 (ref 1.010–1.025)
Urobilinogen, UA: 0.2 E.U./dL
pH, UA: 6 (ref 5.0–8.0)

## 2022-08-29 LAB — BLADDER SCAN AMB NON-IMAGING

## 2022-08-29 MED ORDER — MIRABEGRON ER 50 MG PO TB24: 50.0000 mg | ORAL_TABLET | Freq: Every day | ORAL | 0 refills | Status: DC

## 2022-08-29 NOTE — Telephone Encounter (Signed)
Patient left V.Mail requesting a call back to discuss some lab results from another provider. -lmr

## 2022-08-29 NOTE — Progress Notes (Signed)
Assessment: 1. Urge incontinence   2. Urinary frequency   3. Nephrolithiasis     Plan: I personally reviewed the patient's chart including provider notes, lab results, and imaging results. I personally viewed the CT study from 10/22 with results as noted below. Diagnosis and management of overactive bladder and urge incontinence discussed with the patient.  Options for management including avoidance of dietary irritants, behavioral modification, medical therapy, neuromodulation, and chemodenervation discussed. Bladder diet sheet given. Trial of Myrbetriq 50 mg daily.  Samples given.  Use and side effects discussed. Given history of left ureteral calculus and recent left flank pain, recommend further evaluation with a CT renal stone study. CT renal stone study ordered Return to office in 1 month.   Chief Complaint:  Chief Complaint  Patient presents with   Urinary Frequency   Urinary Incontinence    History of Present Illness:  [name] is a 52 y.o. female who is seen for evaluation of urinary frequency, nocturia, and urinary incontinence. She reports onset of nocturia >1-year ago.  She was having nocturia 2-3 times per night.  She was initially treated with amitriptyline and ropinirole for restless leg syndrome.  She reports an initial improvement in her symptoms.  Since November 2023, she has had worsening of her urinary symptoms.  She currently reports daytime frequency voiding every 30 minutes to 2 hours, urgency, nocturia up to 8 times per night, and urge incontinence.  No stress incontinence.  No dysuria or gross hematuria.  No recent UTIs.  She is not using any incontinence pads at the present time.  She is not on any medical therapy for her symptoms. She has been diagnosed with sleep apnea but does not use a CPAP.  She has previously seen Dr. Junious Silk for evaluation of a left ureteral calculus. CT imaging from 10/22 showed a left renal cyst, 5 mm obstructing stone  in the proximal left ureter with mild hydronephrosis.  Her flank pain resolved.  She was not aware of passing a stone but was not straining her urine.  She had onset of left-sided flank pain again in December 2023.  This resolved spontaneously.  No recent imaging.   Past Medical History:  Past Medical History:  Diagnosis Date   Abdominal pain 03/18/2016   Allergy    Anemia    in the past   Anxiety    Anxiety disorder    Bipolar depression ([place]) 05/29/2016   Breast lesion 12/06/2015   Chicken pox    Complication of anesthesia 1999   urinary retention   Depression    in teh past- ok now   Diarrhea 03/18/2016   Fibromyalgia    GERD (gastroesophageal reflux disease)    Hyperglycemia 03/18/2016   Hyperlipidemia 08/07/2015   Left thyroid nodule 03/31/2013   Melanoma ([place])    right hip and knee   Migraine    Neck pain on right side 12/23/2012   Obesity    Pain 03/31/2013   Palpitations 08/10/2014   Shortness of breath    when anemic   Sinusitis 08/07/2015   Skin lesion of breast 12/06/2015   TIA (transient ischemic attack) 03/31/2013   UTI (lower urinary tract infection)     Past Surgical History:  Past Surgical History:  Procedure Laterality Date   74 HOUR [place] STUDY N/A 04/12/2017   Procedure: 24 HOUR [place];  Surgeon: Mauri Pole, MD;  [facility];  Service: Endoscopy;  Laterality: N/A;   ABDOMINAL HYSTERECTOMY  BACK SURGERY     BILATERAL SALPINGECTOMY Bilateral 09/13/2012   Procedure: BILATERAL SALPINGECTOMY;  Surgeon: Lavonia Drafts, MD;  Location: [facility];  Service: Gynecology;  Laterality: Bilateral;   CHOLECYSTECTOMY     COLONOSCOPY     ESOPHAGEAL MANOMETRY N/A 04/12/2017   Procedure: ESOPHAGEAL MANOMETRY (EM);  Surgeon: Mauri Pole, MD;  [facility];  Service: Endoscopy;  Laterality: N/A;   LAPAROSCOPIC ASSISTED VAGINAL HYSTERECTOMY N/A 09/13/2012   Procedure: LAPAROSCOPIC ASSISTED VAGINAL HYSTERECTOMY;  Surgeon:  Lavonia Drafts, MD;  Location: [facility];  Service: Gynecology;  Laterality: N/A;   MELANOMA EXCISION  2011   stage I and II   WISDOM TOOTH EXTRACTION  1989    Allergies:  Allergies  Allergen Reactions   Other Dermatitis    Band- Aid adhesive Band- Aid adhesive Band- Aid adhesive   Seroquel [Quetiapine Fumarate] Nausea And Vomiting    Nightmares and vomiting    Adhesive [Tape] Dermatitis    Band- Aid adhesive   Aleve [Naproxen Sodium]     Makes hands, legs, and feet swell   Carbamazepine     Hallucinations and dizziness   Gabapentin Other (See Comments)    Weight gain   Mold Extract [Trichophyton]     Family History:  Family History  Problem Relation Age of Onset   Colitis Father        crohn's   Heart disease Father        grandfather   Heart attack Father 68   Arthritis Father        s/p back surgery   Other Mother        neurological autoimmune disease, chorea in trunk   Eczema Mother    Asthma Mother    Arthritis Maternal Grandmother    Lupus Maternal Grandmother    Dementia Maternal Grandmother    CVA Maternal Grandmother    Arthritis Paternal Grandmother    Heart disease Paternal Grandmother        chf   Asthma Paternal Grandmother    Diabetes Paternal Grandfather    Kidney disease Paternal Grandfather    Hypertension Paternal Grandfather    Hyperlipidemia Paternal Grandfather    Cancer Maternal Grandfather        bone   Diabetes Unknown        grandfather   Irritable bowel syndrome Unknown    Kidney disease Unknown        grandfather   Arthritis Other    Cancer Other        breast   Hyperlipidemia Other    Mental illness Other    Emphysema Maternal Uncle    Colon cancer Neg Hx    Allergic rhinitis Neg Hx    Angioedema Neg Hx    Immunodeficiency Neg Hx    Urticaria Neg Hx     Social History:  Social History   Tobacco Use   Smoking status: Never   Smokeless tobacco: Never   Tobacco comments:    never used tobacco  Vaping Use    Vaping Use: Never used  Substance Use Topics   Alcohol use: No    Alcohol/week: 0.0 standard drinks of alcohol   Drug use: No    Review of symptoms:  Constitutional:  Negative for unexplained weight loss, night sweats, fever, chills ENT:  Negative for nose bleeds, sinus pain, painful swallowing CV:  Negative for chest pain, shortness of breath, exercise intolerance, palpitations, loss of consciousness Resp:  Negative for cough, wheezing, shortness of breath  GI:  Negative for nausea, vomiting, diarrhea, bloody stools GU:  Positives noted in HPI; otherwise negative for gross hematuria, dysuria Neuro:  Negative for seizures, poor balance, limb weakness, slurred speech Psych:  Negative for lack of energy, depression, anxiety Endocrine:  Negative for polydipsia, polyuria, symptoms of hypoglycemia (dizziness, hunger, sweating) Hematologic:  Negative for anemia, purpura, petechia, prolonged or excessive bleeding, use of anticoagulants  Allergic:  Negative for difficulty breathing or choking as a result of exposure to anything; no shellfish allergy; no allergic response (rash/itch) to materials, foods  Physical exam: BP (!) 172/104   Pulse 92   Ht 5' 8"$  (1.727 m)   Wt (!) 326 lb (147.9 kg)   LMP 08/13/2012   BMI 49.57 kg/m  GENERAL APPEARANCE:  Well appearing, well developed, well nourished, NAD HEENT: Atraumatic, Normocephalic, oropharynx clear. NECK: Supple without lymphadenopathy or thyromegaly. LUNGS: Clear to auscultation bilaterally. HEART: Regular Rate and Rhythm without murmurs, gallops, or rubs. ABDOMEN: Soft, non-tender, No Masses. EXTREMITIES: Moves all extremities well.  Without clubbing, cyanosis, or edema. NEUROLOGIC:  Alert and oriented x 3, normal gait, CN II-XII grossly intact.  MENTAL STATUS:  Appropriate. BACK:  Non-tender to palpation.  No CVAT SKIN:  Warm, dry and intact.    Results: Results for orders placed or performed in visit on 08/29/22 (from the past 24  hour(s))  Urinalysis   Collection Time: 08/29/22 12:00 AM  Result Value Ref Range   Glucose, UA negative negative mg/dL   Bilirubin, UA negative negative   Ketones, POC UA negative negative mg/dL   Spec Grav, UA 1.020 1.010 - 1.025   Blood, UA negative negative   pH, UA 6.0 5.0 - 8.0   Protein Ur, POC =30 (A) negative mg/dL   Urobilinogen, UA 0.2 0.2 or 1.0 E.U./dL   Nitrite, UA Negative Negative   Leukocytes, UA Trace (A) Negative   PVR:  19 ml

## 2022-08-29 NOTE — H&P (View-Only) (Signed)
Assessment: 1. Urge incontinence   2. Urinary frequency   3. Nephrolithiasis     Plan: I personally reviewed the patient's chart including provider notes, lab results, and imaging results. I personally viewed the CT study from 10/22 with results as noted below. Diagnosis and management of overactive bladder and urge incontinence discussed with the patient.  Options for management including avoidance of dietary irritants, behavioral modification, medical therapy, neuromodulation, and chemodenervation discussed. Bladder diet sheet given. Trial of Myrbetriq 50 mg daily.  Samples given.  Use and side effects discussed. Given history of left ureteral calculus and recent left flank pain, recommend further evaluation with a CT renal stone study. CT renal stone study ordered Return to office in 1 month.   Chief Complaint:  Chief Complaint  Patient presents with   Urinary Frequency   Urinary Incontinence    History of Present Illness:  [name] is a 52 y.o. female who is seen for evaluation of urinary frequency, nocturia, and urinary incontinence. She reports onset of nocturia >1-year ago.  She was having nocturia 2-3 times per night.  She was initially treated with amitriptyline and ropinirole for restless leg syndrome.  She reports an initial improvement in her symptoms.  Since November 2023, she has had worsening of her urinary symptoms.  She currently reports daytime frequency voiding every 30 minutes to 2 hours, urgency, nocturia up to 8 times per night, and urge incontinence.  No stress incontinence.  No dysuria or gross hematuria.  No recent UTIs.  She is not using any incontinence pads at the present time.  She is not on any medical therapy for her symptoms. She has been diagnosed with sleep apnea but does not use a CPAP.  She has previously seen Dr. Junious Silk for evaluation of a left ureteral calculus. CT imaging from 10/22 showed a left renal cyst, 5 mm obstructing stone  in the proximal left ureter with mild hydronephrosis.  Her flank pain resolved.  She was not aware of passing a stone but was not straining her urine.  She had onset of left-sided flank pain again in December 2023.  This resolved spontaneously.  No recent imaging.   Past Medical History:  Past Medical History:  Diagnosis Date   Abdominal pain 03/18/2016   Allergy    Anemia    in the past   Anxiety    Anxiety disorder    Bipolar depression ([place]) 05/29/2016   Breast lesion 12/06/2015   Chicken pox    Complication of anesthesia 1999   urinary retention   Depression    in teh past- ok now   Diarrhea 03/18/2016   Fibromyalgia    GERD (gastroesophageal reflux disease)    Hyperglycemia 03/18/2016   Hyperlipidemia 08/07/2015   Left thyroid nodule 03/31/2013   Melanoma ([place])    right hip and knee   Migraine    Neck pain on right side 12/23/2012   Obesity    Pain 03/31/2013   Palpitations 08/10/2014   Shortness of breath    when anemic   Sinusitis 08/07/2015   Skin lesion of breast 12/06/2015   TIA (transient ischemic attack) 03/31/2013   UTI (lower urinary tract infection)     Past Surgical History:  Past Surgical History:  Procedure Laterality Date   4 HOUR [place] STUDY N/A 04/12/2017   Procedure: 24 HOUR [place];  Surgeon: Mauri Pole, MD;  Location: [facility];  Service: Endoscopy;  Laterality: N/A;   ABDOMINAL HYSTERECTOMY  BACK SURGERY     BILATERAL SALPINGECTOMY Bilateral 09/13/2012   Procedure: BILATERAL SALPINGECTOMY;  Surgeon: Lavonia Drafts, MD;  Location: [facility];  Service: Gynecology;  Laterality: Bilateral;   CHOLECYSTECTOMY     COLONOSCOPY     ESOPHAGEAL MANOMETRY N/A 04/12/2017   Procedure: ESOPHAGEAL MANOMETRY (EM);  Surgeon: Mauri Pole, MD;  Location: [facility];  Service: Endoscopy;  Laterality: N/A;   LAPAROSCOPIC ASSISTED VAGINAL HYSTERECTOMY N/A 09/13/2012   Procedure: LAPAROSCOPIC ASSISTED VAGINAL HYSTERECTOMY;  Surgeon:  Lavonia Drafts, MD;  Location: [facility];  Service: Gynecology;  Laterality: N/A;   MELANOMA EXCISION  2011   stage I and II   WISDOM TOOTH EXTRACTION  1989    Allergies:  Allergies  Allergen Reactions   Other Dermatitis    Band- Aid adhesive Band- Aid adhesive Band- Aid adhesive   Seroquel [Quetiapine Fumarate] Nausea And Vomiting    Nightmares and vomiting    Adhesive [Tape] Dermatitis    Band- Aid adhesive   Aleve [Naproxen Sodium]     Makes hands, legs, and feet swell   Carbamazepine     Hallucinations and dizziness   Gabapentin Other (See Comments)    Weight gain   Mold Extract [Trichophyton]     Family History:  Family History  Problem Relation Age of Onset   Colitis Father        crohn's   Heart disease Father        grandfather   Heart attack Father 21   Arthritis Father        s/p back surgery   Other Mother        neurological autoimmune disease, chorea in trunk   Eczema Mother    Asthma Mother    Arthritis Maternal Grandmother    Lupus Maternal Grandmother    Dementia Maternal Grandmother    CVA Maternal Grandmother    Arthritis Paternal Grandmother    Heart disease Paternal Grandmother        chf   Asthma Paternal Grandmother    Diabetes Paternal Grandfather    Kidney disease Paternal Grandfather    Hypertension Paternal Grandfather    Hyperlipidemia Paternal Grandfather    Cancer Maternal Grandfather        bone   Diabetes Unknown        grandfather   Irritable bowel syndrome Unknown    Kidney disease Unknown        grandfather   Arthritis Other    Cancer Other        breast   Hyperlipidemia Other    Mental illness Other    Emphysema Maternal Uncle    Colon cancer Neg Hx    Allergic rhinitis Neg Hx    Angioedema Neg Hx    Immunodeficiency Neg Hx    Urticaria Neg Hx     Social History:  Social History   Tobacco Use   Smoking status: Never   Smokeless tobacco: Never   Tobacco comments:    never used tobacco  Vaping Use    Vaping Use: Never used  Substance Use Topics   Alcohol use: No    Alcohol/week: 0.0 standard drinks of alcohol   Drug use: No    Review of symptoms:  Constitutional:  Negative for unexplained weight loss, night sweats, fever, chills ENT:  Negative for nose bleeds, sinus pain, painful swallowing CV:  Negative for chest pain, shortness of breath, exercise intolerance, palpitations, loss of consciousness Resp:  Negative for cough, wheezing, shortness of breath  GI:  Negative for nausea, vomiting, diarrhea, bloody stools GU:  Positives noted in HPI; otherwise negative for gross hematuria, dysuria Neuro:  Negative for seizures, poor balance, limb weakness, slurred speech Psych:  Negative for lack of energy, depression, anxiety Endocrine:  Negative for polydipsia, polyuria, symptoms of hypoglycemia (dizziness, hunger, sweating) Hematologic:  Negative for anemia, purpura, petechia, prolonged or excessive bleeding, use of anticoagulants  Allergic:  Negative for difficulty breathing or choking as a result of exposure to anything; no shellfish allergy; no allergic response (rash/itch) to materials, foods  Physical exam: BP (!) 172/104   Pulse 92   Ht '5\' 8"'$  (1.727 m)   Wt (!) 326 lb (147.9 kg)   LMP 08/13/2012   BMI 49.57 kg/m  GENERAL APPEARANCE:  Well appearing, well developed, well nourished, NAD HEENT: Atraumatic, Normocephalic, oropharynx clear. NECK: Supple without lymphadenopathy or thyromegaly. LUNGS: Clear to auscultation bilaterally. HEART: Regular Rate and Rhythm without murmurs, gallops, or rubs. ABDOMEN: Soft, non-tender, No Masses. EXTREMITIES: Moves all extremities well.  Without clubbing, cyanosis, or edema. NEUROLOGIC:  Alert and oriented x 3, normal gait, CN II-XII grossly intact.  MENTAL STATUS:  Appropriate. BACK:  Non-tender to palpation.  No CVAT SKIN:  Warm, dry and intact.    Results: Results for orders placed or performed in visit on 08/29/22 (from the past 24  hour(s))  Urinalysis   Collection Time: 08/29/22 12:00 AM  Result Value Ref Range   Glucose, UA negative negative mg/dL   Bilirubin, UA negative negative   Ketones, POC UA negative negative mg/dL   Spec Grav, UA 1.020 1.010 - 1.025   Blood, UA negative negative   pH, UA 6.0 5.0 - 8.0   Protein Ur, POC =30 (A) negative mg/dL   Urobilinogen, UA 0.2 0.2 or 1.0 E.U./dL   Nitrite, UA Negative Negative   Leukocytes, UA Trace (A) Negative   PVR:  19 ml

## 2022-08-30 DIAGNOSIS — Z713 Dietary counseling and surveillance: Secondary | ICD-10-CM | POA: Diagnosis not present

## 2022-09-02 ENCOUNTER — Encounter: Payer: Self-pay | Admitting: General Practice

## 2022-09-05 DIAGNOSIS — F339 Major depressive disorder, recurrent, unspecified: Secondary | ICD-10-CM | POA: Diagnosis not present

## 2022-09-05 DIAGNOSIS — K449 Diaphragmatic hernia without obstruction or gangrene: Secondary | ICD-10-CM | POA: Diagnosis not present

## 2022-09-05 DIAGNOSIS — K219 Gastro-esophageal reflux disease without esophagitis: Secondary | ICD-10-CM | POA: Diagnosis not present

## 2022-09-06 ENCOUNTER — Telehealth: Payer: Self-pay | Admitting: Urology

## 2022-09-06 NOTE — Addendum Note (Signed)
Addended by: Evelina Bucy on: 09/06/2022 02:02 PM   Modules accepted: Orders

## 2022-09-06 NOTE — Telephone Encounter (Signed)
Spoke with patient about having her CT scan done at [facility] due to her insurance approving the location. Patient was in agreement. [name]

## 2022-09-08 DIAGNOSIS — R6881 Early satiety: Secondary | ICD-10-CM | POA: Diagnosis not present

## 2022-09-08 DIAGNOSIS — K219 Gastro-esophageal reflux disease without esophagitis: Secondary | ICD-10-CM | POA: Diagnosis not present

## 2022-09-12 ENCOUNTER — Ambulatory Visit
Admission: RE | Admit: 2022-09-12 | Discharge: 2022-09-12 | Disposition: A | Discharge status: Home / Self Care | Payer: BC Managed Care – PPO | Source: Ambulatory Visit | Attending: Urology | Admitting: Urology

## 2022-09-12 DIAGNOSIS — K449 Diaphragmatic hernia without obstruction or gangrene: Secondary | ICD-10-CM | POA: Diagnosis not present

## 2022-09-12 DIAGNOSIS — K760 Fatty (change of) liver, not elsewhere classified: Secondary | ICD-10-CM | POA: Diagnosis not present

## 2022-09-12 DIAGNOSIS — N132 Hydronephrosis with renal and ureteral calculous obstruction: Secondary | ICD-10-CM | POA: Diagnosis not present

## 2022-09-12 DIAGNOSIS — N261 Atrophy of kidney (terminal): Secondary | ICD-10-CM | POA: Diagnosis not present

## 2022-09-12 DIAGNOSIS — N200 Calculus of kidney: Secondary | ICD-10-CM

## 2022-09-14 DIAGNOSIS — Z713 Dietary counseling and surveillance: Secondary | ICD-10-CM | POA: Diagnosis not present

## 2022-09-14 DIAGNOSIS — Z6841 Body Mass Index (BMI) 40.0 and over, adult: Secondary | ICD-10-CM | POA: Diagnosis not present

## 2022-09-15 DIAGNOSIS — D225 Melanocytic nevi of trunk: Secondary | ICD-10-CM | POA: Diagnosis not present

## 2022-09-15 DIAGNOSIS — L821 Other seborrheic keratosis: Secondary | ICD-10-CM | POA: Diagnosis not present

## 2022-09-15 DIAGNOSIS — Z85820 Personal history of malignant melanoma of skin: Secondary | ICD-10-CM | POA: Diagnosis not present

## 2022-09-15 DIAGNOSIS — L820 Inflamed seborrheic keratosis: Secondary | ICD-10-CM | POA: Diagnosis not present

## 2022-09-15 DIAGNOSIS — D485 Neoplasm of uncertain behavior of skin: Secondary | ICD-10-CM | POA: Diagnosis not present

## 2022-09-15 DIAGNOSIS — Z08 Encounter for follow-up examination after completed treatment for malignant neoplasm: Secondary | ICD-10-CM | POA: Diagnosis not present

## 2022-09-16 ENCOUNTER — Encounter: Payer: Self-pay | Admitting: Orthopedic Surgery

## 2022-09-16 ENCOUNTER — Ambulatory Visit: Payer: Self-pay | Admitting: Urology

## 2022-09-16 ENCOUNTER — Encounter: Payer: Self-pay | Admitting: Urology

## 2022-09-16 ENCOUNTER — Ambulatory Visit (INDEPENDENT_AMBULATORY_CARE_PROVIDER_SITE_OTHER): Payer: BC Managed Care – PPO | Admitting: Urology

## 2022-09-16 VITALS — BP 167/79 | HR 87 | Ht 68.0 in | Wt 325.0 lb

## 2022-09-16 DIAGNOSIS — N261 Atrophy of kidney (terminal): Secondary | ICD-10-CM | POA: Insufficient documentation

## 2022-09-16 DIAGNOSIS — N1330 Unspecified hydronephrosis: Secondary | ICD-10-CM | POA: Diagnosis not present

## 2022-09-16 DIAGNOSIS — N3941 Urge incontinence: Secondary | ICD-10-CM | POA: Diagnosis not present

## 2022-09-16 DIAGNOSIS — N201 Calculus of ureter: Secondary | ICD-10-CM | POA: Diagnosis not present

## 2022-09-16 DIAGNOSIS — N132 Hydronephrosis with renal and ureteral calculous obstruction: Secondary | ICD-10-CM

## 2022-09-16 LAB — URINALYSIS
Bilirubin, UA: NEGATIVE
Blood, UA: NEGATIVE
Glucose, UA: NEGATIVE mg/dL
Ketones, POC UA: NEGATIVE mg/dL
Leukocytes, UA: NEGATIVE
Nitrite, UA: NEGATIVE
Protein Ur, POC: NEGATIVE mg/dL
Spec Grav, UA: 1.02 (ref 1.010–1.025)
Urobilinogen, UA: 0.2 E.U./dL
pH, UA: 5.5 (ref 5.0–8.0)

## 2022-09-16 NOTE — Progress Notes (Signed)
Assessment: 1. Left ureteral stone   2. Hydronephrosis with urinary obstruction due to ureteral calculus   3. Renal atrophy, left   4. Urge incontinence     Plan: I personally viewed the CT study from 09/13/2022 with results as noted below.  I discussed these results in detail with the patient today.  Given the significant obstruction and evidence of left renal atrophy, I have recommended surgical management with cystoscopy, bilateral retrograde pyelograms, left ureteroscopic laser lithotripsy, and insertion of left ureteral stent. Risks, benefits, alternatives were discussed with the patient in detail.  Potential risks including, but not limited to, infection; bleeding;  injury to urethra, bladder, or ureter; possible need of other treatments; possible failure to remove the calculus; ureteral  stricture formation; cardiac, pulmonary, cerebrovascular events; and anesthetic complications were discussed.  The patient understands and wishes to proceed. I discussed the potential need for percutaneous nephrostomy tube placement if the stone is unable to be easily treated in a retrograde fashion. Continue trial of Myrbetriq 50 mg daily.   Strain urine  Procedure: The patient will be scheduled for cystoscopy, bilateral retrograde pyelograms, left ureteroscopic laser lithotripsy, insertion of left ureteral stent at [facility].  Surgical request is placed with the surgery schedulers and will be scheduled at the patient's/family request. Informed consent is given as documented below. Anesthesia: General  The patient does have sleep apnea, history of MRSA, history of VRE, history of cardiac device requiring special anesthetic needs. Patient is stable and considered clear for surgical in an outpatient ambulatory surgery setting as well as patient hospital setting.  Consent for Operation or Procedure: Provider Certification I hereby certify that the nature, purpose, benefits, usual and most frequent  risks of, and alternatives to, the operation or procedure have been explained to the patient (or person authorized to sign for the patient) either by me as responsible physician or by the provider who is to perform the operation or procedure. Time spent such that the patient/family has had an opportunity to ask questions, and that those questions have been answered. The patient or the patient's representative has been advised that selected tasks may be performed by assistants to the primary health care provider(s). I believe that the patient (or person authorized to sign for the patient) understands what has been explained, and has consented to the operation or procedure. No guarantees were implied or made.   Chief Complaint:  Chief Complaint  Patient presents with   ureteral calculus    History of Present Illness:  [name] is a 52 y.o. female who is seen for further evaluation of a left ureteral calculus with obstruction. She has previously seen Dr. Junious Silk for evaluation of a left ureteral calculus. CT imaging from 10/22 showed a left renal cyst, 5 mm obstructing stone in the proximal left ureter with mild hydronephrosis.  Her flank pain resolved.  She was not aware of passing a stone but was not straining her urine.  She had onset of left-sided flank pain again in December 2023.  This resolved spontaneously. Creatinine from 08/24/2022: 1.21 CT  abdomen and pelvis without contrast from 09/13/2022 showed severe left hydronephrosis and ureteral dilation to a 4 mm calculus in the distal left ureter, left parenchymal atrophy noted as compared to study from 10/22. She is not having any flank pain at the present time.  No dysuria or gross hematuria.  She does report continued urinary frequency, urgency and incontinence.  No fevers or chills.  She was seen in  2/24 with urinary frequency, nocturia, and urinary incontinence. She reported onset of nocturia >1-year ago.  She was having nocturia 2-3  times per night.  She was initially treated with amitriptyline and ropinirole for restless leg syndrome.  She reports an initial improvement in her symptoms.  Since November 2023, she has had worsening of her urinary symptoms.  She reported daytime frequency voiding every 30 minutes to 2 hours, urgency, nocturia up to 8 times per night, and urge incontinence.  No stress incontinence.  No dysuria or gross hematuria.  No recent UTIs.  She was not using any incontinence pads at the present time.  She was not on any medical therapy for her symptoms. She has been diagnosed with sleep apnea but does not use a CPAP.  Portions of the above documentation were copied from a prior visit for review purposes only.   Past Medical History:  Past Medical History:  Diagnosis Date   Abdominal pain 03/18/2016   Acute recurrent streptococcal tonsillitis 12/31/2015   Allergy    Anemia    in the past   Anxiety    Anxiety disorder    Bipolar depression ([place]) 05/29/2016   Breast lesion 12/06/2015   Chicken pox    Complication of anesthesia 1999   urinary retention   Depression    in teh past- ok now   Diarrhea 03/18/2016   Fibromyalgia    GERD (gastroesophageal reflux disease)    Hyperglycemia 03/18/2016   Hyperlipidemia 08/07/2015   Left thyroid nodule 03/31/2013   Melanoma ([place])    right hip and knee   Migraine    Neck pain on right side 12/23/2012   Obesity    Pain 03/31/2013   Palpitations 08/10/2014   Shortness of breath    when anemic   Sinusitis 08/07/2015   Skin lesion of breast 12/06/2015   TIA (transient ischemic attack) 03/31/2013   UTI (lower urinary tract infection)     Past Surgical History:  Past Surgical History:  Procedure Laterality Date   53 HOUR [place] STUDY N/A 04/12/2017   Procedure: 24 HOUR [place];  Surgeon: Mauri Pole, MD;  Location: [facility];  Service: Endoscopy;  Laterality: N/A;   ABDOMINAL HYSTERECTOMY     BACK SURGERY     BILATERAL SALPINGECTOMY  Bilateral 09/13/2012   Procedure: BILATERAL SALPINGECTOMY;  Surgeon: Lavonia Drafts, MD;  Location: [facility];  Service: Gynecology;  Laterality: Bilateral;   CHOLECYSTECTOMY     COLONOSCOPY     ESOPHAGEAL MANOMETRY N/A 04/12/2017   Procedure: ESOPHAGEAL MANOMETRY (EM);  Surgeon: Mauri Pole, MD;  Location: [facility];  Service: Endoscopy;  Laterality: N/A;   LAPAROSCOPIC ASSISTED VAGINAL HYSTERECTOMY N/A 09/13/2012   Procedure: LAPAROSCOPIC ASSISTED VAGINAL HYSTERECTOMY;  Surgeon: Lavonia Drafts, MD;  Location: [facility];  Service: Gynecology;  Laterality: N/A;   MELANOMA EXCISION  2011   stage I and II   WISDOM TOOTH EXTRACTION  1989    Allergies:  Allergies  Allergen Reactions   Other Dermatitis    Band- Aid adhesive Band- Aid adhesive Band- Aid adhesive   Seroquel [Quetiapine Fumarate] Nausea And Vomiting    Nightmares and vomiting    Adhesive [Tape] Dermatitis    Band- Aid adhesive   Aleve [Naproxen Sodium]     Makes hands, legs, and feet swell   Carbamazepine     Hallucinations and dizziness   Gabapentin Other (See Comments)    Weight gain   Mold Extract [Trichophyton]  Family History:  Family History  Problem Relation Age of Onset   Colitis Father        crohn's   Heart disease Father        grandfather   Heart attack Father 24   Arthritis Father        s/p back surgery   Other Mother        neurological autoimmune disease, chorea in trunk   Eczema Mother    Asthma Mother    Arthritis Maternal Grandmother    Lupus Maternal Grandmother    Dementia Maternal Grandmother    CVA Maternal Grandmother    Arthritis Paternal Grandmother    Heart disease Paternal Grandmother        chf   Asthma Paternal Grandmother    Diabetes Paternal Grandfather    Kidney disease Paternal Grandfather    Hypertension Paternal Grandfather    Hyperlipidemia Paternal Grandfather    Cancer Maternal Grandfather        bone   Diabetes Unknown         grandfather   Irritable bowel syndrome Unknown    Kidney disease Unknown        grandfather   Arthritis Other    Cancer Other        breast   Hyperlipidemia Other    Mental illness Other    Emphysema Maternal Uncle    Colon cancer Neg Hx    Allergic rhinitis Neg Hx    Angioedema Neg Hx    Immunodeficiency Neg Hx    Urticaria Neg Hx     Social History:  Social History   Tobacco Use   Smoking status: Never   Smokeless tobacco: Never   Tobacco comments:    never used tobacco  Vaping Use   Vaping Use: Never used  Substance Use Topics   Alcohol use: No    Alcohol/week: 0.0 standard drinks of alcohol   Drug use: No    ROS: Constitutional:  Negative for fever, chills, weight loss CV: Negative for chest pain, previous MI, hypertension Respiratory:  Negative for shortness of breath, wheezing, sleep apnea, frequent cough GI:  Negative for nausea, vomiting, bloody stool, GERD  Physical exam: BP (!) 167/79   Pulse 87   Ht '5\' 8"'$  (1.727 m)   Wt (!) 325 lb (147.4 kg)   LMP 08/13/2012   BMI 49.42 kg/m  GENERAL APPEARANCE:  Well appearing, well developed, well nourished, NAD HEENT:  Atraumatic, normocephalic, oropharynx clear NECK:  Supple without lymphadenopathy or thyromegaly ABDOMEN:  Soft, non-tender, no masses EXTREMITIES:  Moves all extremities well, without clubbing, cyanosis, or edema NEUROLOGIC:  Alert and oriented x 3, normal gait, CN II-XII grossly intact MENTAL STATUS:  appropriate BACK:  Non-tender to palpation, No CVAT SKIN:  Warm, dry, and intact  Results: U/A dipstick: pH 5.5, negative blood, negative LE  CT RENAL STONE STUDY  Result Date: 09/13/2022 CLINICAL DATA:  Left-sided flank pain. Nephrolithiasis. Overactive bladder. EXAM: CT ABDOMEN AND PELVIS WITHOUT CONTRAST TECHNIQUE: Multidetector CT imaging of the abdomen and pelvis was performed following the standard protocol without IV contrast. RADIATION DOSE REDUCTION: This exam was performed according  to the departmental dose-optimization program which includes automated exposure control, adjustment of the mA and/or kV according to patient size and/or use of iterative reconstruction technique. COMPARISON:  05/13/2021 FINDINGS: Lower chest: No acute findings. Hepatobiliary: No mass visualized on this unenhanced exam. Moderate diffuse hepatic steatosis is seen with focal fatty sparing adjacent to the porta hepatis.  Prior cholecystectomy. No evidence of biliary obstruction. Pancreas: No mass or inflammatory process visualized on this unenhanced exam. Spleen:  Within normal limits in size. Adrenals/Urinary tract: New severe left hydronephrosis and moderate ureterectasis is seen due to a 4 mm calculus in the distal left ureter. Increased diffuse left renal parenchymal atrophy is also seen since prior study. Stomach/Bowel: Moderate size hiatal hernia is seen. No evidence of obstruction, inflammatory process, or abnormal fluid collections. Normal appendix visualized. Vascular/Lymphatic: No pathologically enlarged lymph nodes identified. No evidence of abdominal aortic aneurysm. Reproductive: Prior hysterectomy noted. Adnexal regions are unremarkable in appearance. Other:  None. Musculoskeletal:  No suspicious bone lesions identified. IMPRESSION: Severe left hydronephrosis and moderate ureterectasis due to 4 mm distal left ureteral calculus. Increased diffuse left renal parenchymal atrophy. Moderate hepatic steatosis. Moderate hiatal hernia. Electronically Signed   By: Marlaine Hind M.D.   On: 09/13/2022 09:09

## 2022-09-16 NOTE — Progress Notes (Signed)
Surgery orders requested via Epic inbox. °

## 2022-09-16 NOTE — Patient Instructions (Addendum)
SURGICAL WAITING ROOM VISITATION Patients having surgery or a procedure may have no more than 2 support people in the waiting area - these visitors may rotate.    If the patient needs to stay at the hospital during part of their recovery, the visitor guidelines for inpatient rooms apply. Pre-op nurse will coordinate an appropriate time for 1 support person to accompany patient in pre-op.  This support person may not rotate.    Please refer to the [facility] website for the visitor guidelines for Inpatients (after your surgery is over and you are in a regular room).   Due to an increase in RSV and influenza rates and associated hospitalizations, children ages 48 and under may not visit patients in [place].     Your procedure is scheduled on: 09-20-22   Report to [facility] Main Entrance    Report to admitting at 11:30 AM   Call this number if you have problems the morning of surgery [phone]   Do not eat food or drink liquids :After Midnight.           If you have questions, please contact your surgeon's office.   FOLLOW  ANY ADDITIONAL PRE OP INSTRUCTIONS YOU RECEIVED FROM YOUR SURGEON'S OFFICE!!!     Oral Hygiene is also important to reduce your risk of infection.                                    Remember - BRUSH YOUR TEETH THE MORNING OF SURGERY WITH YOUR REGULAR TOOTHPASTE   Do NOT smoke after Midnight   Take these medicines the morning of surgery with A SIP OF WATER:   Mirabegron  Pantoprazole  Zyrtec             You may not have any metal on your body including hair pins, jewelry, and body piercing             Do not wear make-up, lotions, powders, perfumes or deodorant  Do not wear nail polish including gel and S&S, artificial/acrylic nails, or any other type of covering on natural nails including finger and toenails. If you have artificial nails, gel coating, etc. that needs to be removed by a nail salon please have this removed prior to  surgery or surgery may need to be canceled/ delayed if the surgeon/ anesthesia feels like they are unable to be safely monitored.   Do not shave  48 hours prior to surgery.         Do not bring valuables to the hospital. [place].   Contacts, dentures or bridgework may not be worn into surgery.  DO NOT [place]. PHARMACY WILL DISPENSE MEDICATIONS LISTED ON YOUR MEDICATION LIST TO YOU DURING YOUR ADMISSION [place]!    Patients discharged on the day of surgery will not be allowed to drive home.  Someone NEEDS to stay with you for the first 24 hours after anesthesia.   Special Instructions: Bring a copy of your healthcare power of attorney and living will documents the day of surgery if you haven't scanned them before.              Please read over the following fact sheets you were given: IF [place] [phone]  If you received a COVID test during  your pre-op visit  it is requested that you wear a mask when out in public, stay away from anyone that may not be feeling well and notify your surgeon if you develop symptoms. If you test positive for Covid or have been in contact with anyone that has tested positive in the last 10 days please notify you surgeon.  [place] - Preparing for Surgery Before surgery, you can play an important role.  Because skin is not sterile, your skin needs to be as free of germs as possible.  You can reduce the number of germs on your skin by washing with CHG (chlorahexidine gluconate) soap before surgery.  CHG is an antiseptic cleaner which kills germs and bonds with the skin to continue killing germs even after washing. Please DO NOT use if you have an allergy to CHG or antibacterial soaps.  If your skin becomes reddened/irritated stop using the CHG and inform your nurse when you arrive at Short Stay. Do not shave (including legs  and underarms) for at least 48 hours prior to the first CHG shower.  You may shave your face/neck.  Please follow these instructions carefully:  1.  Shower with CHG Soap the night before surgery and the  morning of surgery.  2.  If you choose to wash your hair, wash your hair first as usual with your normal  shampoo.  3.  After you shampoo, rinse your hair and body thoroughly to remove the shampoo.                             4.  Use CHG as you would any other liquid soap.  You can apply chg directly to the skin and wash.  Gently with a scrungie or clean washcloth.  5.  Apply the CHG Soap to your body ONLY FROM THE NECK DOWN.   Do   not use on face/ open                           Wound or open sores. Avoid contact with eyes, ears mouth and   genitals (private parts).                       Wash face,  Genitals (private parts) with your normal soap.             6.  Wash thoroughly, paying special attention to the area where your    surgery  will be performed.  7.  Thoroughly rinse your body with warm water from the neck down.  8.  DO NOT shower/wash with your normal soap after using and rinsing off the CHG Soap.                9.  Pat yourself dry with a clean towel.            10.  Wear clean pajamas.            11.  Place clean sheets on your bed the night of your first shower and do not  sleep with pets. Day of Surgery : Do not apply any lotions/deodorants the morning of surgery.  Please wear clean clothes to the hospital/surgery center.  FAILURE TO FOLLOW THESE INSTRUCTIONS MAY RESULT IN THE CANCELLATION OF YOUR SURGERY  PATIENT SIGNATURE_________________________________  NURSE SIGNATURE__________________________________  ________________________________________________________________________

## 2022-09-19 ENCOUNTER — Other Ambulatory Visit: Payer: Self-pay

## 2022-09-19 ENCOUNTER — Ambulatory Visit: Payer: BC Managed Care – PPO | Admitting: Orthopedic Surgery

## 2022-09-19 ENCOUNTER — Encounter (HOSPITAL_COMMUNITY): Payer: Self-pay

## 2022-09-19 ENCOUNTER — Encounter (HOSPITAL_COMMUNITY)
Admission: RE | Admit: 2022-09-19 | Discharge: 2022-09-19 | Disposition: A | Discharge status: Home / Self Care | Payer: BC Managed Care – PPO | Source: Ambulatory Visit | Attending: Urology | Admitting: Urology

## 2022-09-19 DIAGNOSIS — Z01812 Encounter for preprocedural laboratory examination: Secondary | ICD-10-CM | POA: Insufficient documentation

## 2022-09-19 DIAGNOSIS — N201 Calculus of ureter: Secondary | ICD-10-CM | POA: Insufficient documentation

## 2022-09-19 DIAGNOSIS — N132 Hydronephrosis with renal and ureteral calculous obstruction: Secondary | ICD-10-CM | POA: Diagnosis not present

## 2022-09-19 DIAGNOSIS — D509 Iron deficiency anemia, unspecified: Secondary | ICD-10-CM | POA: Insufficient documentation

## 2022-09-19 DIAGNOSIS — N261 Atrophy of kidney (terminal): Secondary | ICD-10-CM | POA: Diagnosis not present

## 2022-09-19 DIAGNOSIS — G4730 Sleep apnea, unspecified: Secondary | ICD-10-CM | POA: Diagnosis not present

## 2022-09-19 DIAGNOSIS — K219 Gastro-esophageal reflux disease without esophagitis: Secondary | ICD-10-CM | POA: Diagnosis not present

## 2022-09-19 HISTORY — DX: Personal history of urinary calculi: Z87.442

## 2022-09-19 HISTORY — DX: Sleep apnea, unspecified: G47.30

## 2022-09-19 LAB — CBC
HCT: 38.5 % (ref 36.0–46.0)
Hemoglobin: 12.2 g/dL (ref 12.0–15.0)
MCH: 27 pg (ref 26.0–34.0)
MCHC: 31.7 g/dL (ref 30.0–36.0)
MCV: 85.2 fL (ref 80.0–100.0)
Platelets: 300 10*3/uL (ref 150–400)
RBC: 4.52 MIL/uL (ref 3.87–5.11)
RDW: 14.4 % (ref 11.5–15.5)
WBC: 7.9 10*3/uL (ref 4.0–10.5)
nRBC: 0 % (ref 0.0–0.2)

## 2022-09-19 LAB — BASIC METABOLIC PANEL
Anion gap: 11 (ref 5–15)
BUN: 15 mg/dL (ref 6–20)
CO2: 24 mmol/L (ref 22–32)
Calcium: 9.2 mg/dL (ref 8.9–10.3)
Chloride: 105 mmol/L (ref 98–111)
Creatinine, Ser: 1.11 mg/dL — ABNORMAL HIGH (ref 0.44–1.00)
GFR, Estimated: 60 mL/min — ABNORMAL LOW (ref >60)
Glucose, Bld: 108 mg/dL — ABNORMAL HIGH (ref 70–99)
Potassium: 4.4 mmol/L (ref 3.5–5.1)
Sodium: 140 mmol/L (ref 135–145)

## 2022-09-19 NOTE — Progress Notes (Signed)
Anesthesia note:     PCP - Dr. Sharilyn Sites Cardiologist -Dr. Candee Furbish Other-   Chest x-ray - no EKG - 11/11/21-epic Stress Test - no ECHO - 5?11/23-epic Cardiac Cath - no CABG-no Pacemaker/ICD device last checked:NA  Sleep Study - yes CPAP - yes  Pt is pre diabetic-no CBG at PAT visit- Fasting Blood Sugar at home- Checks Blood Sugar _____  Blood Thinner:NA Blood Thinner Instructions: Aspirin Instructions: Last Dose:  Anesthesia review: Yes   reason:  Patient denies shortness of breath, fever, cough and chest pain at PAT appointment. Pt reports SOB with most activities. She is waiting to have gastric by pass surgery because her BMI is 49.4.    Patient verbalized understanding of instructions that were given to them at the PAT appointment. Patient was also instructed that they will need to review over the PAT instructions again at home before surgery.Yes

## 2022-09-20 ENCOUNTER — Ambulatory Visit (HOSPITAL_COMMUNITY): Payer: BC Managed Care – PPO | Admitting: Physician Assistant

## 2022-09-20 ENCOUNTER — Encounter (HOSPITAL_COMMUNITY)
Admission: AD | Disposition: A | Discharge status: Home / Self Care | Payer: Self-pay | Source: Ambulatory Visit | Attending: Urology

## 2022-09-20 ENCOUNTER — Other Ambulatory Visit: Payer: Self-pay

## 2022-09-20 ENCOUNTER — Encounter (HOSPITAL_COMMUNITY): Payer: Self-pay | Admitting: Urology

## 2022-09-20 ENCOUNTER — Ambulatory Visit (HOSPITAL_COMMUNITY): Payer: BC Managed Care – PPO | Admitting: Anesthesiology

## 2022-09-20 ENCOUNTER — Ambulatory Visit (HOSPITAL_COMMUNITY)
Admission: AD | Admit: 2022-09-20 | Discharge: 2022-09-20 | Disposition: A | Discharge status: Home / Self Care | Payer: BC Managed Care – PPO | Source: Ambulatory Visit | Attending: Urology | Admitting: Urology

## 2022-09-20 ENCOUNTER — Ambulatory Visit (HOSPITAL_COMMUNITY): Payer: BC Managed Care – PPO

## 2022-09-20 DIAGNOSIS — N1330 Unspecified hydronephrosis: Secondary | ICD-10-CM | POA: Diagnosis not present

## 2022-09-20 DIAGNOSIS — Z9989 Dependence on other enabling machines and devices: Secondary | ICD-10-CM | POA: Diagnosis not present

## 2022-09-20 DIAGNOSIS — N201 Calculus of ureter: Secondary | ICD-10-CM | POA: Diagnosis not present

## 2022-09-20 DIAGNOSIS — N261 Atrophy of kidney (terminal): Secondary | ICD-10-CM | POA: Insufficient documentation

## 2022-09-20 DIAGNOSIS — G4733 Obstructive sleep apnea (adult) (pediatric): Secondary | ICD-10-CM | POA: Diagnosis not present

## 2022-09-20 DIAGNOSIS — G4730 Sleep apnea, unspecified: Secondary | ICD-10-CM | POA: Insufficient documentation

## 2022-09-20 DIAGNOSIS — N132 Hydronephrosis with renal and ureteral calculous obstruction: Secondary | ICD-10-CM | POA: Diagnosis not present

## 2022-09-20 DIAGNOSIS — D509 Iron deficiency anemia, unspecified: Secondary | ICD-10-CM | POA: Insufficient documentation

## 2022-09-20 HISTORY — PX: CYSTOSCOPY/URETEROSCOPY/HOLMIUM LASER/STENT PLACEMENT: SHX6546

## 2022-09-20 SURGERY — CYSTOURETEROSCOPY, WITH RETROGRADE PYELOGRAM AND STENT INSERTION
Anesthesia: General | Laterality: Bilateral

## 2022-09-20 SURGERY — CYSTOSCOPY/URETEROSCOPY/HOLMIUM LASER/STENT PLACEMENT
Anesthesia: General | Laterality: Left

## 2022-09-20 MED ORDER — OXYCODONE HCL 5 MG/5ML PO SOLN: 5.0000 mg | Freq: Once | ORAL | Status: DC | PRN

## 2022-09-20 MED ORDER — LABETALOL HCL 5 MG/ML IV SOLN
10.0000 mg | Freq: Once | INTRAVENOUS | Status: AC
  Administered 2022-09-20: 10 mg via INTRAVENOUS

## 2022-09-20 MED ORDER — OXYBUTYNIN CHLORIDE 5 MG PO TABS
5.0000 mg | ORAL_TABLET | Freq: Three times a day (TID) | ORAL | Status: DC
  Administered 2022-09-20: 5 mg via ORAL

## 2022-09-20 MED ORDER — ACETAMINOPHEN 500 MG PO TABS
1000.0000 mg | ORAL_TABLET | Freq: Once | ORAL | Status: AC
  Administered 2022-09-20: 1000 mg via ORAL
  Filled 2022-09-20: qty 2

## 2022-09-20 MED ORDER — FENTANYL CITRATE PF 50 MCG/ML IJ SOSY: 25.0000 ug | PREFILLED_SYRINGE | INTRAMUSCULAR | Status: DC | PRN

## 2022-09-20 MED ORDER — ONDANSETRON HCL 4 MG/2ML IJ SOLN
INTRAMUSCULAR | Status: AC
  Filled 2022-09-20: qty 2

## 2022-09-20 MED ORDER — LABETALOL HCL 5 MG/ML IV SOLN
INTRAVENOUS | Status: AC
  Filled 2022-09-20: qty 4

## 2022-09-20 MED ORDER — KETOROLAC TROMETHAMINE 15 MG/ML IJ SOLN
INTRAMUSCULAR | Status: AC
  Filled 2022-09-20: qty 1

## 2022-09-20 MED ORDER — IOHEXOL 300 MG/ML  SOLN
INTRAMUSCULAR | Status: DC | PRN
  Administered 2022-09-20: 17 mL

## 2022-09-20 MED ORDER — LACTATED RINGERS IV SOLN: INTRAVENOUS | Status: DC

## 2022-09-20 MED ORDER — PROPOFOL 10 MG/ML IV BOLUS
INTRAVENOUS | Status: AC
  Filled 2022-09-20: qty 20

## 2022-09-20 MED ORDER — ACETAMINOPHEN 325 MG PO TABS: 325.0000 mg | ORAL_TABLET | ORAL | Status: DC | PRN

## 2022-09-20 MED ORDER — PHENAZOPYRIDINE HCL 200 MG PO TABS
ORAL_TABLET | ORAL | Status: AC
  Filled 2022-09-20: qty 1

## 2022-09-20 MED ORDER — SCOPOLAMINE 1 MG/3DAYS TD PT72
1.0000 | MEDICATED_PATCH | TRANSDERMAL | Status: DC
  Administered 2022-09-20: 1.5 mg via TRANSDERMAL
  Filled 2022-09-20: qty 1

## 2022-09-20 MED ORDER — FENTANYL CITRATE (PF) 100 MCG/2ML IJ SOLN
INTRAMUSCULAR | Status: AC
  Filled 2022-09-20: qty 2

## 2022-09-20 MED ORDER — PROPOFOL 10 MG/ML IV BOLUS
INTRAVENOUS | Status: DC | PRN
  Administered 2022-09-20: 200 mg via INTRAVENOUS

## 2022-09-20 MED ORDER — OXYBUTYNIN CHLORIDE 5 MG PO TABS
ORAL_TABLET | ORAL | Status: AC
  Filled 2022-09-20: qty 1

## 2022-09-20 MED ORDER — LIDOCAINE HCL URETHRAL/MUCOSAL 2 % EX GEL
CUTANEOUS | Status: DC | PRN
  Administered 2022-09-20: 1 via URETHRAL

## 2022-09-20 MED ORDER — LIDOCAINE HCL (CARDIAC) PF 100 MG/5ML IV SOSY
PREFILLED_SYRINGE | INTRAVENOUS | Status: DC | PRN
  Administered 2022-09-20 (×2): 50 mg via INTRAVENOUS

## 2022-09-20 MED ORDER — MIDAZOLAM HCL 2 MG/2ML IJ SOLN
INTRAMUSCULAR | Status: AC
  Filled 2022-09-20: qty 2

## 2022-09-20 MED ORDER — ACETAMINOPHEN 10 MG/ML IV SOLN: 1000.0000 mg | Freq: Once | INTRAVENOUS | Status: DC | PRN

## 2022-09-20 MED ORDER — NITROFURANTOIN MONOHYD MACRO 100 MG PO CAPS: ORAL_CAPSULE | ORAL | 0 refills | Status: DC

## 2022-09-20 MED ORDER — SODIUM CHLORIDE 0.9 % IR SOLN
Status: DC | PRN
  Administered 2022-09-20: 6000 mL

## 2022-09-20 MED ORDER — ACETAMINOPHEN 160 MG/5ML PO SOLN: 325.0000 mg | ORAL | Status: DC | PRN

## 2022-09-20 MED ORDER — PHENAZOPYRIDINE HCL 200 MG PO TABS
200.0000 mg | ORAL_TABLET | Freq: Three times a day (TID) | ORAL | Status: DC
  Administered 2022-09-20: 200 mg via ORAL

## 2022-09-20 MED ORDER — MIDAZOLAM HCL 5 MG/5ML IJ SOLN
INTRAMUSCULAR | Status: DC | PRN
  Administered 2022-09-20 (×2): 1 mg via INTRAVENOUS

## 2022-09-20 MED ORDER — FENTANYL CITRATE (PF) 100 MCG/2ML IJ SOLN
INTRAMUSCULAR | Status: DC | PRN
  Administered 2022-09-20 (×4): 50 ug via INTRAVENOUS

## 2022-09-20 MED ORDER — OXYCODONE HCL 5 MG PO TABS: 5.0000 mg | ORAL_TABLET | Freq: Once | ORAL | Status: DC | PRN

## 2022-09-20 MED ORDER — LIDOCAINE HCL (PF) 2 % IJ SOLN
INTRAMUSCULAR | Status: AC
  Filled 2022-09-20: qty 5

## 2022-09-20 MED ORDER — KETOROLAC TROMETHAMINE 15 MG/ML IJ SOLN
15.0000 mg | Freq: Once | INTRAMUSCULAR | Status: AC
  Administered 2022-09-20: 15 mg via INTRAVENOUS

## 2022-09-20 MED ORDER — PROMETHAZINE HCL 25 MG/ML IJ SOLN: 6.2500 mg | INTRAMUSCULAR | Status: DC | PRN

## 2022-09-20 MED ORDER — LIDOCAINE HCL URETHRAL/MUCOSAL 2 % EX GEL
CUTANEOUS | Status: AC
  Filled 2022-09-20: qty 30

## 2022-09-20 MED ORDER — ONDANSETRON HCL 4 MG/2ML IJ SOLN
INTRAMUSCULAR | Status: DC | PRN
  Administered 2022-09-20: 4 mg via INTRAVENOUS

## 2022-09-20 MED ORDER — CHLORHEXIDINE GLUCONATE 0.12 % MT SOLN
15.0000 mL | Freq: Once | OROMUCOSAL | Status: AC
  Administered 2022-09-20: 15 mL via OROMUCOSAL

## 2022-09-20 MED ORDER — ORAL CARE MOUTH RINSE: 15.0000 mL | Freq: Once | OROMUCOSAL | Status: AC

## 2022-09-20 MED ORDER — BUTALBITAL-APAP-CAFFEINE 50-325-40 MG PO TABS
1.0000 | ORAL_TABLET | Freq: Once | ORAL | Status: AC
  Administered 2022-09-20: 1 via ORAL
  Filled 2022-09-20: qty 1

## 2022-09-20 MED ORDER — CEFAZOLIN IN SODIUM CHLORIDE 3-0.9 GM/100ML-% IV SOLN
3.0000 g | INTRAVENOUS | Status: AC
  Administered 2022-09-20: 3 g via INTRAVENOUS
  Filled 2022-09-20: qty 100

## 2022-09-20 MED ORDER — AMISULPRIDE (ANTIEMETIC) 5 MG/2ML IV SOLN: 10.0000 mg | Freq: Once | INTRAVENOUS | Status: DC | PRN

## 2022-09-20 MED ORDER — DEXAMETHASONE SODIUM PHOSPHATE 10 MG/ML IJ SOLN
INTRAMUSCULAR | Status: DC | PRN
  Administered 2022-09-20: 8 mg via INTRAVENOUS

## 2022-09-20 MED ORDER — HYDROCODONE-ACETAMINOPHEN 5-325 MG PO TABS: 1.0000 | ORAL_TABLET | Freq: Four times a day (QID) | ORAL | 0 refills | Status: DC | PRN

## 2022-09-20 MED ORDER — PHENAZOPYRIDINE HCL 200 MG PO TABS: 200.0000 mg | ORAL_TABLET | Freq: Three times a day (TID) | ORAL | 1 refills | Status: DC | PRN

## 2022-09-20 MED ORDER — DEXAMETHASONE SODIUM PHOSPHATE 10 MG/ML IJ SOLN
INTRAMUSCULAR | Status: AC
  Filled 2022-09-20: qty 1

## 2022-09-20 SURGICAL SUPPLY — 24 items
BAG COUNTER SPONGE SURGICOUNT (BAG) IMPLANT
BAG SPNG CNTER NS LX DISP (BAG)
BAG URO CATCHER STRL LF (MISCELLANEOUS) ×2 IMPLANT
BASKET ZERO TIP NITINOL 2.4FR (BASKET) IMPLANT
BSKT STON RTRVL ZERO TP 2.4FR (BASKET)
CATH URETL OPEN END 6FR 70 (CATHETERS) IMPLANT
CLOTH BEACON ORANGE TIMEOUT ST (SAFETY) ×2 IMPLANT
EXTRACTOR STONE 1.7FRX115CM (UROLOGICAL SUPPLIES) IMPLANT
GLOVE SURG LX STRL 7.5 STRW (GLOVE) ×2 IMPLANT
GOWN STRL REUS W/ TWL XL LVL3 (GOWN DISPOSABLE) ×2 IMPLANT
GOWN STRL REUS W/TWL XL LVL3 (GOWN DISPOSABLE) ×1
GUIDEWIRE STR DUAL SENSOR (WIRE) ×2 IMPLANT
GUIDEWIRE ZIPWRE .038 STRAIGHT (WIRE) IMPLANT
IV NS 1000ML (IV SOLUTION) ×1
IV NS 1000ML BAXH (IV SOLUTION) ×2 IMPLANT
KIT TURNOVER KIT A (KITS) IMPLANT
LASER FIB FLEXIVA PULSE ID 365 (Laser) IMPLANT
MANIFOLD NEPTUNE II (INSTRUMENTS) ×2 IMPLANT
PACK CYSTO (CUSTOM PROCEDURE TRAY) ×2 IMPLANT
SHEATH NAVIGATOR HD 12/14X36 (SHEATH) IMPLANT
TRACTIP FLEXIVA PULS ID 200XHI (Laser) IMPLANT
TRACTIP FLEXIVA PULSE ID 200 (Laser)
TUBING CONNECTING 10 (TUBING) ×2 IMPLANT
TUBING UROLOGY SET (TUBING) ×2 IMPLANT

## 2022-09-20 NOTE — Anesthesia Postprocedure Evaluation (Signed)
Anesthesia Post Note  Patient: [name]  Procedure(s) Performed: CYSTOSCOPY/URETEROSCOPY/HOLMIUM LASER/STENT PLACEMENT (Left)     Patient location during evaluation: PACU Anesthesia Type: General Level of consciousness: awake and alert Pain management: pain level controlled Vital Signs Assessment: post-procedure vital signs reviewed and stable Respiratory status: spontaneous breathing, nonlabored ventilation, respiratory function stable and patient connected to nasal cannula oxygen Cardiovascular status: blood pressure returned to baseline and stable Postop Assessment: no apparent nausea or vomiting Anesthetic complications: no  No notable events documented.  Last Vitals:  Vitals:   09/20/22 1530 09/20/22 1545  BP: (!) 170/80 134/82  Pulse:  80  Resp: 14 (!) 24  Temp:    SpO2: 94% 94%    Last Pain:  Vitals:   09/20/22 1545  TempSrc:   PainSc: 0-No pain                 Tiajuana Amass

## 2022-09-20 NOTE — Anesthesia Preprocedure Evaluation (Addendum)
Anesthesia Evaluation  Patient identified by MRN, date of birth, ID band Patient awake    Reviewed: Allergy & Precautions, NPO status , Patient's Chart, lab work & pertinent test results  Airway Mallampati: II  TM Distance: >3 FB Neck ROM: Full    Dental  (+) Teeth Intact, Dental Advisory Given   Pulmonary asthma , sleep apnea and Continuous Positive Airway Pressure Ventilation    breath sounds clear to auscultation       Cardiovascular negative cardio ROS  Rhythm:Regular Rate:Normal     Neuro/Psych  Headaches PSYCHIATRIC DISORDERS   Bipolar Disorder      GI/Hepatic Neg liver ROS,GERD  ,,  Endo/Other  negative endocrine ROS    Renal/GU Renal disease     Musculoskeletal  (+) Arthritis ,  Fibromyalgia -  Abdominal   Peds  Hematology negative hematology ROS (+)   Anesthesia Other Findings   Reproductive/Obstetrics                             Anesthesia Physical Anesthesia Plan  ASA: 3  Anesthesia Plan: General   Post-op Pain Management: Minimal or no pain anticipated   Induction: Intravenous  PONV Risk Score and Plan: 4 or greater and Ondansetron, Dexamethasone, Scopolamine patch - Pre-op and Midazolam  Airway Management Planned: LMA  Additional Equipment: None  Intra-op Plan:   Post-operative Plan: Extubation in OR  Informed Consent: I have reviewed the patients History and Physical, chart, labs and discussed the procedure including the risks, benefits and alternatives for the proposed anesthesia with the patient or authorized representative who has indicated his/her understanding and acceptance.     Dental advisory given  Plan Discussed with: CRNA  Anesthesia Plan Comments:        Anesthesia Quick Evaluation

## 2022-09-20 NOTE — Transfer of Care (Signed)
Immediate Anesthesia Transfer of Care Note  Patient: [name]  Procedure(s) Performed: CYSTOSCOPY/URETEROSCOPY/HOLMIUM LASER/STENT PLACEMENT (Left)  Patient Location: PACU  Anesthesia Type:General  Level of Consciousness: awake, alert , and patient cooperative  Airway & Oxygen Therapy: Patient Spontanous Breathing and Patient connected to face mask oxygen  Post-op Assessment: Report given to RN and Post -op Vital signs reviewed and stable  Post vital signs: Reviewed and stable  Last Vitals:  Vitals Value Taken Time  BP 187/104 09/20/22 1506  Temp 36.2 C 09/20/22 1507  Pulse 80 09/20/22 1508  Resp 16 09/20/22 1508  SpO2 97 % 09/20/22 1508  Vitals shown include unvalidated device data.  Last Pain:  Vitals:   09/20/22 1200  TempSrc: Oral  PainSc: 0-No pain         Complications: No notable events documented.

## 2022-09-20 NOTE — Anesthesia Procedure Notes (Signed)
Procedure Name: Intubation Date/Time: 09/20/2022 2:01 PM  Performed by: Garrel Ridgel, CRNAPre-anesthesia Checklist: Patient identified, Emergency Drugs available, Suction available and Patient being monitored Patient Re-evaluated:Patient Re-evaluated prior to induction Oxygen Delivery Method: Circle system utilized Preoxygenation: Pre-oxygenation with 100% oxygen Induction Type: IV induction Ventilation: Mask ventilation without difficulty LMA: LMA with gastric port inserted Tube type: Oral Tube size: 4.0 mm Number of attempts: 1 Placement Confirmation: positive ETCO2 and breath sounds checked- equal and bilateral Tube secured with: Tape Dental Injury: Teeth and Oropharynx as per pre-operative assessment

## 2022-09-20 NOTE — Op Note (Signed)
OPERATIVE NOTE   Patient Name: [name]  MRN: [id number]   Date of Procedure: 09/20/22  Preoperative diagnosis:  Left ureteral calculus Left hydronephrosis Left renal atrophy   Postoperative diagnosis:  Left ureteral calculus (impacted) Left hydronephrosis Left renal atrophy  Procedure:  Cystoscopy Bilateral retrograde pyelograms Left ureteroscopic laser lithotripsy (stone obtained) Insertion of left ureteral stent (81F x 26 cm, no tether) Fluoroscopy, with intraoperative interpretation  Attending: Primus Bravo, MD  Anesthesia: General  Estimated blood loss: 5 ml  Fluids: Per anesthesia record  Drains: 81F x 26 cm left ureteral stent (no tether)  Specimens: Stone fragments  Antibiotics: Ancef 3 gm IV  Findings: Normal bladder and urethra; normal right retrograde pyelogram; left hydroureteronephrosis; impacted distal left ureteral calculus  Indications:  52 year old female with recently diagnosed left distal ureteral calculus with severe left hydronephrosis and ureteral dilation to the level of the stone in the distal ureter with associated left parenchymal atrophy presents now for surgical management.  She was previously diagnosed with a left proximal ureteral stone with mild left hydronephrosis in October 2022.  Her flank pain resolved.  She was not aware of passing the stone.  She did not return for any follow-up imaging.  She recently underwent repeat evaluation with CT imaging which showed significant left hydroureteronephrosis to the level of a 4 mm calculus in the distal left ureter and left renal atrophy.  Her creatinine was slightly elevated at 1.21 on 08/24/2022.  Treatment options were discussed.  She presents now for cystoscopy, bilateral retrograde pyelograms, left ureteroscopic laser lithotripsy, and insertion of left ureteral stent.  Risk and benefits of the procedure were discussed in detail.  She understands wishes to proceed as  described.  Description of Procedure:  The patient received IV Ancef preoperatively.  After successful induction of a general anesthetic, the patient was placed in the dorsolithotomy position.  The patient's genital area was prepped draped in sterile fashion.  Under direct visualization, a 58 French rigid cystoscope was passed through the urethra and into the bladder.  The bladder was inspected throughout its entirety.  No stones were seen within the bladder.  No mucosal abnormalities were seen.  A normal-appearing trigone was noted with a single orifice bilaterally.  Bilateral retrograde pyelograms were performed for evaluation of the patient's upper tracts given her history of nephrolithiasis.  Scout film showed a nonspecific bowel gas pattern.  No obvious bony abnormalities were appreciated.  Using a 5 [place] open-ended catheter, contrast was injected into the right ureter.  No filling defects or evidence of obstruction was seen.  A normal appearing collecting system was noted.  In a similar fashion, contrast was injected into the left ureter.  The distal ureter was normal in appearance.  There was minimal contrast seen beyond a point of obstruction in the distal ureter.  The proximal left ureter and left renal pelvis were noted to be dilated.  A sensor guidewire was then passed through the open-ended catheter and negotiated beyond the point of obstruction with minimal difficulty.  The guidewire was noted to curl in the dilated left renal pelvis.  Using a micro 6 semirigid ureteroscope, ureteroscopy was then performed alongside the guidewire.  A stone was noted in the distal left ureter with significant mucosal edema almost completely surrounding the stone.  I was not initially able to easily visualize the ureteral orifice.  Using the holmium laser fiber, the stone was carefully fragmented into multiple small pieces.  Upon fragmentation, I was able to  easily visualize the ureter proximal to the stone.  The  ureteroscope was passed into the proximal ureter without difficulty.  No other ureteral abnormalities were seen.  Using a stone basket, all visible stone fragments were carefully removed.  Again, there was significant edema of the ureter at the prior stone location.  The ureter was inspected and there was no evidence of any injury.  The cystoscope was replaced and a 6 [place] by 26 cm double-J stent was then passed over the guidewire and into the left renal pelvis.  Position was confirmed with fluoroscopy.  The tether was removed prior to stent placement.  There was drainage of the dilated left collecting system noted after stent placement.  The bladder was then drained and the cystoscope was removed.  Intraurethral lidocaine jelly was placed.  The patient was then extubated and taken to the post anesthesia care unit in stable condition.  Complications: None  Condition: Stable, extubated, transferred to PACU  Plan:  Discharge to home today Continue left ureteral stent x 2 weeks

## 2022-09-20 NOTE — Interval H&P Note (Signed)
History and Physical Interval Note:  09/20/2022 1:33 PM  [name]  has presented today for surgery, with the diagnosis of ureteral calculus.  The various methods of treatment have been discussed with the patient and family. After consideration of risks, benefits and other options for treatment, the patient has consented to  Procedure(s): CYSTOSCOPY/URETEROSCOPY/HOLMIUM LASER/STENT PLACEMENT (Left) as a surgical intervention.  The patient's history has been reviewed, patient examined, no change in status, stable for surgery.  I have reviewed the patient's chart and labs.  Questions were answered to the patient's satisfaction.     Michaelle Birks

## 2022-09-21 ENCOUNTER — Encounter (HOSPITAL_COMMUNITY): Payer: Self-pay | Admitting: Urology

## 2022-09-26 DIAGNOSIS — Z8249 Family history of ischemic heart disease and other diseases of the circulatory system: Secondary | ICD-10-CM | POA: Diagnosis not present

## 2022-09-26 DIAGNOSIS — Z1330 Encounter for screening examination for mental health and behavioral disorders, unspecified: Secondary | ICD-10-CM | POA: Diagnosis not present

## 2022-09-26 DIAGNOSIS — I10 Essential (primary) hypertension: Secondary | ICD-10-CM | POA: Diagnosis not present

## 2022-09-26 DIAGNOSIS — Z6841 Body Mass Index (BMI) 40.0 and over, adult: Secondary | ICD-10-CM | POA: Diagnosis not present

## 2022-09-27 ENCOUNTER — Other Ambulatory Visit: Payer: Self-pay | Admitting: Urology

## 2022-09-27 ENCOUNTER — Ambulatory Visit: Payer: BC Managed Care – PPO | Admitting: Urology

## 2022-09-27 ENCOUNTER — Other Ambulatory Visit: Payer: Self-pay

## 2022-09-27 ENCOUNTER — Telehealth: Payer: Self-pay | Admitting: Urology

## 2022-09-27 DIAGNOSIS — N3941 Urge incontinence: Secondary | ICD-10-CM

## 2022-09-27 MED ORDER — MIRABEGRON ER 50 MG PO TB24: 50.0000 mg | ORAL_TABLET | Freq: Every evening | ORAL | 5 refills | Status: DC

## 2022-09-27 MED ORDER — SOLIFENACIN SUCCINATE 10 MG PO TABS: 10.0000 mg | ORAL_TABLET | Freq: Every day | ORAL | 5 refills | Status: DC

## 2022-09-27 NOTE — Telephone Encounter (Signed)
Patient left a V.mail stating she is almost out of her samples of:  MYRBETRIQ) 50 MG TB24 tablet .  Patient wants to know what to do next... A prescription sent in or what is she to do... - lmr.

## 2022-09-27 NOTE — Telephone Encounter (Signed)
Pt states she is unsure if the medication is helping her or not, but if you think it will help with her stent discomfort then you can send a prescription to [facility] in [place].

## 2022-09-27 NOTE — Telephone Encounter (Signed)
Patient aware new prescription was sent to the pharmacy that should be much cheaper. She verbalized understanding.

## 2022-09-27 NOTE — Telephone Encounter (Signed)
Spoke with pt, she states that the Rx is over $400 and she is not going to be able to afford that. Please advise.

## 2022-09-30 ENCOUNTER — Telehealth: Payer: Self-pay

## 2022-09-30 DIAGNOSIS — R079 Chest pain, unspecified: Secondary | ICD-10-CM

## 2022-09-30 DIAGNOSIS — R635 Abnormal weight gain: Secondary | ICD-10-CM | POA: Diagnosis not present

## 2022-09-30 MED ORDER — METOPROLOL TARTRATE 100 MG PO TABS: ORAL_TABLET | ORAL | 0 refills | Status: DC

## 2022-09-30 NOTE — Addendum Note (Signed)
Addended by: Barbarann Ehlers A on: 09/30/2022 09:22 AM   Modules accepted: Orders

## 2022-09-30 NOTE — Telephone Encounter (Signed)
I spoke with patient and she agrees to schedule cardiac CT  She had BMET on 09/19/22  I e-scribed Lopressor 100 mg to [facility]   CT instructions sent via [employer]

## 2022-09-30 NOTE — Telephone Encounter (Signed)
-----   Message from Jerline Pain, MD sent at 09/29/2022  3:25 PM EDT ----- Regarding: RE: coronary CT If she had chest discomfort, which I do see from my prior note is the reason we were getting the CT scan, she would need a coronary CT scan, not a $99 coronary calcium score.  Candee Furbish, MD  ----- Message ----- From: Bernita Raisin, RN Sent: 09/27/2022  10:23 AM EDT To: Jerline Pain, MD Subject: coronary CT                                    Hi there!  You saw patient last year in April and ordered a coronary CT. She cancelled as she had over $5,000 in medical bills.  She now calls and wants to schedule. She had renal stents put in and was started on lisinopril by new pcp with [facility] as they noted elevated BP's   I see your note where she could get self pay coronary ct for $99. She said she would be interested in that if you are agreeable. Her Echo showed normal EF. Her only complaint now is occasional pvc's.   Thanks !

## 2022-10-04 ENCOUNTER — Encounter: Payer: Self-pay | Admitting: Urology

## 2022-10-04 ENCOUNTER — Ambulatory Visit (INDEPENDENT_AMBULATORY_CARE_PROVIDER_SITE_OTHER): Payer: BC Managed Care – PPO | Admitting: Urology

## 2022-10-04 ENCOUNTER — Telehealth: Payer: Self-pay | Admitting: Urology

## 2022-10-04 ENCOUNTER — Telehealth (HOSPITAL_COMMUNITY): Payer: Self-pay | Admitting: *Deleted

## 2022-10-04 VITALS — BP 138/93 | HR 94 | Ht 68.0 in | Wt 323.0 lb

## 2022-10-04 DIAGNOSIS — R8290 Unspecified abnormal findings in urine: Secondary | ICD-10-CM

## 2022-10-04 DIAGNOSIS — N201 Calculus of ureter: Secondary | ICD-10-CM | POA: Diagnosis not present

## 2022-10-04 DIAGNOSIS — N1330 Unspecified hydronephrosis: Secondary | ICD-10-CM | POA: Diagnosis not present

## 2022-10-04 DIAGNOSIS — N132 Hydronephrosis with renal and ureteral calculous obstruction: Secondary | ICD-10-CM

## 2022-10-04 DIAGNOSIS — N3941 Urge incontinence: Secondary | ICD-10-CM | POA: Diagnosis not present

## 2022-10-04 DIAGNOSIS — N261 Atrophy of kidney (terminal): Secondary | ICD-10-CM

## 2022-10-04 LAB — URINALYSIS, ROUTINE W REFLEX MICROSCOPIC
Nitrite, UA: POSITIVE — AB
Specific Gravity, UA: 1.015 (ref 1.005–1.030)
Urobilinogen, Ur: 8 mg/dL — ABNORMAL HIGH (ref 0.2–1.0)
pH, UA: 5 (ref 5.0–7.5)

## 2022-10-04 LAB — MICROSCOPIC EXAMINATION
Cast Type: NONE SEEN
Casts: NONE SEEN /lpf
Epithelial Cells (non renal): >10 /hpf — AB (ref 0–10)
Mucus, UA: NONE SEEN
RBC, Urine: >30 /hpf — AB (ref 0–2)
Trichomonas, UA: NONE SEEN
WBC, UA: >30 /hpf — AB (ref 0–5)
Yeast, UA: NONE SEEN

## 2022-10-04 MED ORDER — CEFTRIAXONE SODIUM 1 G IJ SOLR
1.0000 g | Freq: Once | INTRAMUSCULAR | Status: AC
  Administered 2022-10-04: 1 g via INTRAMUSCULAR

## 2022-10-04 MED ORDER — CEFDINIR 300 MG PO CAPS: 300.0000 mg | ORAL_CAPSULE | Freq: Two times a day (BID) | ORAL | 0 refills | Status: AC

## 2022-10-04 MED ORDER — FLUCONAZOLE 150 MG PO TABS: 150.0000 mg | ORAL_TABLET | Freq: Once | ORAL | 1 refills | Status: AC

## 2022-10-04 NOTE — Telephone Encounter (Signed)
Patient called back in regards to she forgot to mention that she has a  CT scan of heart & it is with Contrast IN THE MORNING. Patient has to be there at 7:15am/7:30am to start the contrast and she was questioning is this OKAY with the stent and kidney issues going on? Please Advise

## 2022-10-04 NOTE — Telephone Encounter (Signed)
Reaching out to patient to offer assistance regarding upcoming cardiac imaging study; pt verbalizes understanding of appt date/time, parking situation and where to check in, pre-test NPO status and medications ordered, and verified current allergies; name and call back number provided for further questions should they arise  Lafonda Patron RN Navigator Cardiac Imaging [facility] [phone] office [phone] cell  Patient to take 100mg metoprolol tartrate two hours prior to her cardiac CT scan. She is aware to arrive at 7:30am. 

## 2022-10-04 NOTE — Progress Notes (Signed)
IM Injection  Patient is present today for an IM Injection for treatment of UTI. Drug: Ceftriaxone Dose:1g Location:Right upper outer buttocks Lot: [id number] Exp:04/2024 Patient tolerated well, no complications were noted  Performed by: Bradly Bienenstock CMA

## 2022-10-04 NOTE — Progress Notes (Signed)
Assessment: 1. Left ureteral stone   2. Hydronephrosis with urinary obstruction due to ureteral calculus   3. Renal atrophy, left   4. Urge incontinence   5. Abnormal urine findings     Plan: Given the abnormal urinalysis suggestive of possible UTI, I recommended postponing stent removal today. Resolve MDX urine culture sent today. Rocephin 1 g IM given today. Begin Cefdinir 300 mg BID x 7 days Fluconazole 150 mg x 1, may repeat in 3 days CBC, BMP today. Continue solifenacin 10 mg daily and Pyridium as needed. Will contact her with culture results and to arrange for cystoscopy and stent removal once any UTI has been appropriately treated.   Chief Complaint:  Chief Complaint  Patient presents with   Cysto Stent Removal    History of Present Illness:  [name] is a 52 y.o. female who is seen for further evaluation of a left ureteral calculus with obstruction. She has previously seen Dr. Junious Silk for evaluation of a left ureteral calculus. CT imaging from 10/22 showed a left renal cyst, 5 mm obstructing stone in the proximal left ureter with mild hydronephrosis.  Her flank pain resolved.  She was not aware of passing a stone but was not straining her urine.  She had onset of left-sided flank pain again in December 2023.  This resolved spontaneously. Creatinine from 08/24/2022: 1.21 CT  abdomen and pelvis without contrast from 09/13/2022 showed severe left hydronephrosis and ureteral dilation to a 4 mm calculus in the distal left ureter, left parenchymal atrophy noted as compared to study from 10/22.  She was seen in 2/24 with urinary frequency, nocturia, and urinary incontinence. She reported onset of nocturia >1-year ago.  She was having nocturia 2-3 times per night.  She was initially treated with amitriptyline and ropinirole for restless leg syndrome.  She reports an initial improvement in her symptoms.  Since November 2023, she has had worsening of her urinary symptoms.   She reported daytime frequency voiding every 30 minutes to 2 hours, urgency, nocturia up to 8 times per night, and urge incontinence.  No stress incontinence.  No dysuria or gross hematuria.  No recent UTIs.  She was not using any incontinence pads at the present time.  She was not on any medical therapy for her symptoms. She has been diagnosed with sleep apnea but does not use a CPAP.  She underwent cystoscopy, bilateral retrograde pyelograms, left ureteroscopic laser lithotripsy with stone manipulation and insertion of left ureteral stent on 09/20/2022.  The stone was removed completely.  She has a left ureteral stent in place.  She presents today for possible stent removal.  She continues on Macrobid daily.  She is also taking Pyridium and Vesicare.  She is having significant stent related symptoms with frequency, urgency, and incontinence.  She also is having some discomfort in the left flank area.  She reports lower extremity swelling and cramping of her upper and lower extremities.  No fever or chills.  No nausea or vomiting.  She is not having gross hematuria.  Portions of the above documentation were copied from a prior visit for review purposes only.   Past Medical History:  Past Medical History:  Diagnosis Date   Acute recurrent streptococcal tonsillitis [id number]   Complication of anesthesia 1999   urinary retention   Fibromyalgia    GERD (gastroesophageal reflux disease)    History of kidney stones    Hyperlipidemia 08/07/2015   Left thyroid nodule 03/31/2013   Melanoma ([place])  right hip and knee   Migraine    Neck pain on right side 12/23/2012   Obesity    Palpitations 08/10/2014   Shortness of breath    when anemic   Sinusitis 08/07/2015   with allergies   Sleep apnea    C-Pap    Past Surgical History:  Past Surgical History:  Procedure Laterality Date   45 HOUR [place] STUDY N/A 04/12/2017   Procedure: [place];  Surgeon: Mauri Pole,  MD;  Location: [facility];  Service: Endoscopy;  Laterality: N/A;   ABDOMINAL HYSTERECTOMY  2013   BACK SURGERY  1999   BILATERAL SALPINGECTOMY Bilateral 09/13/2012   Procedure: BILATERAL SALPINGECTOMY;  Surgeon: Lavonia Drafts, MD;  Location: [facility];  Service: Gynecology;  Laterality: Bilateral;   CHOLECYSTECTOMY  2012   COLONOSCOPY     CYSTOSCOPY/URETEROSCOPY/HOLMIUM LASER/STENT PLACEMENT Left 09/20/2022   Procedure: CYSTOSCOPY/URETEROSCOPY/HOLMIUM LASER/STENT PLACEMENT;  Surgeon: Primus Bravo., MD;  Location: [facility];  Service: Urology;  Laterality: Left;   ESOPHAGEAL MANOMETRY N/A 04/12/2017   Procedure: ESOPHAGEAL MANOMETRY (EM);  Surgeon: Mauri Pole, MD;  Location: [facility];  Service: Endoscopy;  Laterality: N/A;   LAPAROSCOPIC ASSISTED VAGINAL HYSTERECTOMY N/A 09/13/2012   Procedure: LAPAROSCOPIC ASSISTED VAGINAL HYSTERECTOMY;  Surgeon: Lavonia Drafts, MD;  Location: [facility];  Service: Gynecology;  Laterality: N/A;   MELANOMA EXCISION  2011   stage I and II   WISDOM TOOTH EXTRACTION  1989    Allergies:  Allergies  Allergen Reactions   Seroquel [Quetiapine Fumarate] Nausea And Vomiting    Nightmares and vomiting    Adhesive [Tape] Dermatitis    Band- Aid adhesive   Aleve [Naproxen Sodium]     Makes hands, legs, and feet swell   Carbamazepine     Hallucinations and dizziness   Gabapentin Other (See Comments)    Weight gain   Mold Extract [Trichophyton]     Family History:  Family History  Problem Relation Age of Onset   Colitis Father        crohn's   Heart disease Father        grandfather   Heart attack Father 57   Arthritis Father        s/p back surgery   Other Mother        neurological autoimmune disease, chorea in trunk   Eczema Mother    Asthma Mother    Arthritis Maternal Grandmother    Lupus Maternal Grandmother    Dementia Maternal Grandmother    CVA Maternal Grandmother    Arthritis Paternal Grandmother    Heart  disease Paternal Grandmother        chf   Asthma Paternal Grandmother    Diabetes Paternal Grandfather    Kidney disease Paternal Grandfather    Hypertension Paternal Grandfather    Hyperlipidemia Paternal Grandfather    Cancer Maternal Grandfather        bone   Diabetes Unknown        grandfather   Irritable bowel syndrome Unknown    Kidney disease Unknown        grandfather   Arthritis Other    Cancer Other        breast   Hyperlipidemia Other    Mental illness Other    Emphysema Maternal Uncle    Colon cancer Neg Hx    Allergic rhinitis Neg Hx    Angioedema Neg Hx    Immunodeficiency Neg Hx    Urticaria Neg  Hx     Social History:  Social History   Tobacco Use   Smoking status: Never   Smokeless tobacco: Never   Tobacco comments:    never used tobacco  Vaping Use   Vaping Use: Never used  Substance Use Topics   Alcohol use: No    Alcohol/week: 0.0 standard drinks of alcohol   Drug use: No    ROS: Constitutional:  Negative for fever, chills, weight loss CV: Negative for chest pain, previous MI, hypertension Respiratory:  Negative for shortness of breath, wheezing, sleep apnea, frequent cough GI:  Negative for nausea, vomiting, bloody stool, GERD  Physical exam: BP (!) 138/93   Pulse 94   Ht 5\' 8"  (1.727 m)   Wt (!) 323 lb (146.5 kg)   LMP 08/13/2012   BMI 49.11 kg/m  GENERAL APPEARANCE:  Well appearing, well developed, well nourished, NAD HEENT:  Atraumatic, normocephalic, oropharynx clear NECK:  Supple without lymphadenopathy or thyromegaly ABDOMEN:  Soft, non-tender, no masses EXTREMITIES:  Moves all extremities well, without clubbing, cyanosis, or edema NEUROLOGIC:  Alert and oriented x 3, normal gait, CN II-XII grossly intact MENTAL STATUS:  appropriate BACK:  Non-tender to palpation, No CVAT SKIN:  Warm, dry, and intact  Results: U/A: >30 WBC, >30 RBC, many bacteria, nitrite +

## 2022-10-05 ENCOUNTER — Other Ambulatory Visit (HOSPITAL_COMMUNITY): Payer: Self-pay | Admitting: *Deleted

## 2022-10-05 ENCOUNTER — Encounter: Payer: Self-pay | Admitting: Urology

## 2022-10-05 ENCOUNTER — Ambulatory Visit (HOSPITAL_COMMUNITY)
Admission: RE | Admit: 2022-10-05 | Discharge: 2022-10-05 | Disposition: A | Discharge status: Home / Self Care | Payer: BC Managed Care – PPO | Source: Ambulatory Visit | Attending: Cardiology | Admitting: Cardiology

## 2022-10-05 ENCOUNTER — Encounter (HOSPITAL_COMMUNITY): Payer: Self-pay

## 2022-10-05 DIAGNOSIS — F3340 Major depressive disorder, recurrent, in remission, unspecified: Secondary | ICD-10-CM | POA: Diagnosis not present

## 2022-10-05 DIAGNOSIS — R079 Chest pain, unspecified: Secondary | ICD-10-CM

## 2022-10-05 LAB — BASIC METABOLIC PANEL
BUN/Creatinine Ratio: 11 (ref 9–23)
BUN: 13 mg/dL (ref 6–24)
CO2: 20 mmol/L (ref 20–29)
Calcium: 9.2 mg/dL (ref 8.7–10.2)
Chloride: 103 mmol/L (ref 96–106)
Creatinine, Ser: 1.17 mg/dL — ABNORMAL HIGH (ref 0.57–1.00)
Glucose: 88 mg/dL (ref 70–99)
Potassium: 4.4 mmol/L (ref 3.5–5.2)
Sodium: 139 mmol/L (ref 134–144)
eGFR: 56 mL/min/{1.73_m2} — ABNORMAL LOW (ref >59)

## 2022-10-05 LAB — CBC
Hematocrit: 40.8 % (ref 34.0–46.6)
Hemoglobin: 13 g/dL (ref 11.1–15.9)
MCH: 26.2 pg — ABNORMAL LOW (ref 26.6–33.0)
MCHC: 31.9 g/dL (ref 31.5–35.7)
MCV: 82 fL (ref 79–97)
Platelets: 320 10*3/uL (ref 150–450)
RBC: 4.97 x10E6/uL (ref 3.77–5.28)
RDW: 14.2 % (ref 11.7–15.4)
WBC: 9.2 10*3/uL (ref 3.4–10.8)

## 2022-10-05 MED ORDER — METOPROLOL TARTRATE 100 MG PO TABS: ORAL_TABLET | ORAL | 0 refills | Status: DC

## 2022-10-05 MED ORDER — NITROGLYCERIN 0.4 MG SL SUBL: 0.8000 mg | SUBLINGUAL_TABLET | Freq: Once | SUBLINGUAL | Status: DC

## 2022-10-06 ENCOUNTER — Telehealth: Payer: Self-pay

## 2022-10-06 ENCOUNTER — Encounter: Payer: Self-pay | Admitting: Urology

## 2022-10-06 DIAGNOSIS — D485 Neoplasm of uncertain behavior of skin: Secondary | ICD-10-CM | POA: Diagnosis not present

## 2022-10-06 DIAGNOSIS — B078 Other viral warts: Secondary | ICD-10-CM | POA: Diagnosis not present

## 2022-10-06 DIAGNOSIS — D225 Melanocytic nevi of trunk: Secondary | ICD-10-CM | POA: Diagnosis not present

## 2022-10-06 LAB — URINE CULTURE: Organism ID, Bacteria: NO GROWTH

## 2022-10-06 NOTE — Telephone Encounter (Signed)
Spoke with patient, she is aware of the results and the need to come in on Monday to have her stent removed per Dr. Felipa Eth.

## 2022-10-06 NOTE — Telephone Encounter (Signed)
-----   Message from Primus Bravo, MD sent at 10/06/2022  1:41 PM EDT ----- Please notify the patient that her urine culture did not show evidence of a UTI. Please have her continue the antibiotics and schedule her for cysto and stent removal on Monday March 25 at 11:30.

## 2022-10-10 ENCOUNTER — Encounter: Payer: Self-pay | Admitting: Urology

## 2022-10-10 ENCOUNTER — Ambulatory Visit (INDEPENDENT_AMBULATORY_CARE_PROVIDER_SITE_OTHER): Payer: BC Managed Care – PPO | Admitting: Urology

## 2022-10-10 VITALS — BP 172/84 | HR 87 | Ht 68.0 in | Wt 322.0 lb

## 2022-10-10 DIAGNOSIS — N201 Calculus of ureter: Secondary | ICD-10-CM | POA: Diagnosis not present

## 2022-10-10 DIAGNOSIS — N3941 Urge incontinence: Secondary | ICD-10-CM

## 2022-10-10 DIAGNOSIS — Z466 Encounter for fitting and adjustment of urinary device: Secondary | ICD-10-CM | POA: Diagnosis not present

## 2022-10-10 DIAGNOSIS — N261 Atrophy of kidney (terminal): Secondary | ICD-10-CM

## 2022-10-10 DIAGNOSIS — N132 Hydronephrosis with renal and ureteral calculous obstruction: Secondary | ICD-10-CM

## 2022-10-10 LAB — MICROSCOPIC EXAMINATION
Cast Type: NONE SEEN
Casts: NONE SEEN /lpf
Crystal Type: NONE SEEN
Crystals: NONE SEEN
Mucus, UA: NONE SEEN
RBC, Urine: >30 /hpf — AB (ref 0–2)
Trichomonas, UA: NONE SEEN
Yeast, UA: NONE SEEN

## 2022-10-10 LAB — URINALYSIS, ROUTINE W REFLEX MICROSCOPIC
Bilirubin, UA: NEGATIVE
Ketones, UA: NEGATIVE
Leukocytes,UA: NEGATIVE
Nitrite, UA: POSITIVE — AB
Specific Gravity, UA: 1.025 (ref 1.005–1.030)
Urobilinogen, Ur: 1 mg/dL (ref 0.2–1.0)
pH, UA: 5.5 (ref 5.0–7.5)

## 2022-10-10 MED ORDER — PHENAZOPYRIDINE HCL 200 MG PO TABS: 200.0000 mg | ORAL_TABLET | Freq: Three times a day (TID) | ORAL | 1 refills | Status: DC | PRN

## 2022-10-10 NOTE — Progress Notes (Signed)
Assessment: 1. Left ureteral stone   2. Hydronephrosis with urinary obstruction due to ureteral calculus   3. Renal atrophy, left   4. Urge incontinence     Plan: Left ureteral stent removed today Complete Cefdinir 300 mg BID x 7 days Continue solifenacin 10 mg daily and Pyridium as needed. Return to office in 1 month Renal U/S after next visit Consider lasix renogram for evaluation of split renal function   Chief Complaint:  Chief Complaint  Patient presents with   Cysto Stent Removal    History of Present Illness:  FIKISHA MANZOOR is a 52 y.o. female who is seen for further evaluation of a left ureteral calculus with obstruction. She has previously seen Dr. Junious Silk for evaluation of a left ureteral calculus. CT imaging from 10/22 showed a left renal cyst, 5 mm obstructing stone in the proximal left ureter with mild hydronephrosis.  Her flank pain resolved.  She was not aware of passing a stone but was not straining her urine.  She had onset of left-sided flank pain again in December 2023.  This resolved spontaneously. Creatinine from 08/24/2022: 1.21 CT  abdomen and pelvis without contrast from 09/13/2022 showed severe left hydronephrosis and ureteral dilation to a 4 mm calculus in the distal left ureter, left parenchymal atrophy noted as compared to study from 10/22.  She was seen in 2/24 with urinary frequency, nocturia, and urinary incontinence. She reported onset of nocturia >1-year ago.  She was having nocturia 2-3 times per night.  She was initially treated with amitriptyline and ropinirole for restless leg syndrome.  She reports an initial improvement in her symptoms.  Since November 2023, she has had worsening of her urinary symptoms.  She reported daytime frequency voiding every 30 minutes to 2 hours, urgency, nocturia up to 8 times per night, and urge incontinence.  No stress incontinence.  No dysuria or gross hematuria.  No recent UTIs.  She was not using any  incontinence pads at the present time.  She was not on any medical therapy for her symptoms. She has been diagnosed with sleep apnea but does not use a CPAP.  She underwent cystoscopy, bilateral retrograde pyelograms, left ureteroscopic laser lithotripsy with stone manipulation and insertion of left ureteral stent on 09/20/2022.  The stone was removed completely.  She has a left ureteral stent in place.  She presented on 10/04/2022 for possible stent removal.  She continued on Macrobid daily.  She was also taking Pyridium and Vesicare.  She was having significant stent related symptoms with frequency, urgency, and incontinence.  She also was having some discomfort in the left flank area.  She reported lower extremity swelling and cramping of her upper and lower extremities.  No fever or chills.  No nausea or vomiting.  She is not having gross hematuria. Her urinalysis was suspicious for a possible UTI.  She was given Rocephin 1 g IM and started on cefdinir. Resolve MDX urine culture showed no growth. Creatinine stable at 1.17.  Portions of the above documentation were copied from a prior visit for review purposes only.   Past Medical History:  Past Medical History:  Diagnosis Date   Acute recurrent streptococcal tonsillitis [id number]   Complication of anesthesia 1999   urinary retention   Fibromyalgia    GERD (gastroesophageal reflux disease)    History of kidney stones    Hyperlipidemia 08/07/2015   Left thyroid nodule 03/31/2013   Melanoma ([place])    right hip and knee  Migraine    Neck pain on right side 12/23/2012   Obesity    Palpitations 08/10/2014   Shortness of breath    when anemic   Sinusitis 08/07/2015   with allergies   Sleep apnea    C-Pap    Past Surgical History:  Past Surgical History:  Procedure Laterality Date   42 HOUR [place] STUDY N/A 04/12/2017   Procedure: [place];  Surgeon: Mauri Pole, MD;  [facility];  Service:  Endoscopy;  Laterality: N/A;   ABDOMINAL HYSTERECTOMY  2013   BACK SURGERY  1999   BILATERAL SALPINGECTOMY Bilateral 09/13/2012   Procedure: BILATERAL SALPINGECTOMY;  Surgeon: Lavonia Drafts, MD;  Location: [facility];  Service: Gynecology;  Laterality: Bilateral;   CHOLECYSTECTOMY  2012   COLONOSCOPY     CYSTOSCOPY/URETEROSCOPY/HOLMIUM LASER/STENT PLACEMENT Left 09/20/2022   Procedure: CYSTOSCOPY/URETEROSCOPY/HOLMIUM LASER/STENT PLACEMENT;  Surgeon: Primus Bravo., MD;  Location: [facility];  Service: Urology;  Laterality: Left;   ESOPHAGEAL MANOMETRY N/A 04/12/2017   Procedure: ESOPHAGEAL MANOMETRY (EM);  Surgeon: Mauri Pole, MD;  Location[facility];  Service: Endoscopy;  Laterality: N/A;   LAPAROSCOPIC ASSISTED VAGINAL HYSTERECTOMY N/A 09/13/2012   Procedure: LAPAROSCOPIC ASSISTED VAGINAL HYSTERECTOMY;  Surgeon: Lavonia Drafts, MD;  Location: [facility];  Service: Gynecology;  Laterality: N/A;   MELANOMA EXCISION  2011   stage I and II   WISDOM TOOTH EXTRACTION  1989    Allergies:  Allergies  Allergen Reactions   Seroquel [Quetiapine Fumarate] Nausea And Vomiting    Nightmares and vomiting    Adhesive [Tape] Dermatitis    Band- Aid adhesive   Aleve [Naproxen Sodium]     Makes hands, legs, and feet swell   Carbamazepine     Hallucinations and dizziness   Gabapentin Other (See Comments)    Weight gain   Mold Extract [Trichophyton]     Family History:  Family History  Problem Relation Age of Onset   Colitis Father        crohn's   Heart disease Father        grandfather   Heart attack Father 45   Arthritis Father        s/p back surgery   Other Mother        neurological autoimmune disease, chorea in trunk   Eczema Mother    Asthma Mother    Arthritis Maternal Grandmother    Lupus Maternal Grandmother    Dementia Maternal Grandmother    CVA Maternal Grandmother    Arthritis Paternal Grandmother    Heart disease Paternal Grandmother         chf   Asthma Paternal Grandmother    Diabetes Paternal Grandfather    Kidney disease Paternal Grandfather    Hypertension Paternal Grandfather    Hyperlipidemia Paternal Grandfather    Cancer Maternal Grandfather        bone   Diabetes Unknown        grandfather   Irritable bowel syndrome Unknown    Kidney disease Unknown        grandfather   Arthritis Other    Cancer Other        breast   Hyperlipidemia Other    Mental illness Other    Emphysema Maternal Uncle    Colon cancer Neg Hx    Allergic rhinitis Neg Hx    Angioedema Neg Hx    Immunodeficiency Neg Hx    Urticaria Neg Hx     Social  History:  Social History   Tobacco Use   Smoking status: Never   Smokeless tobacco: Never   Tobacco comments:    never used tobacco  Vaping Use   Vaping Use: Never used  Substance Use Topics   Alcohol use: No    Alcohol/week: 0.0 standard drinks of alcohol   Drug use: No    ROS: Constitutional:  Negative for fever, chills, weight loss CV: Negative for chest pain, previous MI, hypertension Respiratory:  Negative for shortness of breath, wheezing, sleep apnea, frequent cough GI:  Negative for nausea, vomiting, bloody stool, GERD  Physical exam: BP (!) 172/84   Pulse 87   Ht 5\' 8"  (1.727 m)   Wt (!) 322 lb (146.1 kg)   LMP 08/13/2012   BMI 48.96 kg/m  GENERAL APPEARANCE:  Well appearing, well developed, well nourished, NAD HEENT:  Atraumatic, normocephalic, oropharynx clear NECK:  Supple without lymphadenopathy or thyromegaly ABDOMEN:  Soft, non-tender, no masses EXTREMITIES:  Moves all extremities well, without clubbing, cyanosis, or edema NEUROLOGIC:  Alert and oriented x 3, normal gait, CN II-XII grossly intact MENTAL STATUS:  appropriate BACK:  Non-tender to palpation, No CVAT SKIN:  Warm, dry, and intact  Results: U/A: 6-10 WBC, >30 RBC, 0-10 epithelial cells, few bacteria  CYSTOSCOPY/STENT REMOVAL  Pre-Operative Diagnosis:   Ureteral  calculus  Post-Operative Diagnosis:  Ureteral calculus  Anesthesia: local with lidocaine gel  Surgical Narrative:  After appropriate informed consent was obtained, the patient was prepped and draped in the usual sterile fashion in the supine position. She was correctly identified and the proper procedure delineated prior to proceeding. Sterile lidocaine gel was instilled in the urethra. The flexible cystoscope was introduced without difficulty.  The left ureteral stent was identified.  Using stent graspers, the left ureteral stent was removed.  The flexible cystoscope was removed. She tolerated the procedure well.  A chaperone was present throughout the procedure.

## 2022-10-11 ENCOUNTER — Ambulatory Visit (HOSPITAL_COMMUNITY)
Admission: RE | Admit: 2022-10-11 | Discharge: 2022-10-11 | Disposition: A | Discharge status: Home / Self Care | Payer: BC Managed Care – PPO | Source: Ambulatory Visit | Attending: Cardiology | Admitting: Cardiology

## 2022-10-11 ENCOUNTER — Encounter: Payer: Self-pay | Admitting: Urology

## 2022-10-11 DIAGNOSIS — R079 Chest pain, unspecified: Secondary | ICD-10-CM | POA: Diagnosis not present

## 2022-10-11 DIAGNOSIS — R072 Precordial pain: Secondary | ICD-10-CM | POA: Diagnosis not present

## 2022-10-11 MED ORDER — NITROGLYCERIN 0.4 MG SL SUBL
0.8000 mg | SUBLINGUAL_TABLET | Freq: Once | SUBLINGUAL | Status: AC
  Administered 2022-10-11: 0.8 mg via SUBLINGUAL

## 2022-10-11 MED ORDER — IOHEXOL 350 MG/ML SOLN
100.0000 mL | Freq: Once | INTRAVENOUS | Status: AC | PRN
  Administered 2022-10-11: 100 mL via INTRAVENOUS

## 2022-10-11 MED ORDER — ONDANSETRON HCL 4 MG/2ML IJ SOLN
INTRAMUSCULAR | Status: AC
  Filled 2022-10-11: qty 2

## 2022-10-11 MED ORDER — NITROGLYCERIN 0.4 MG SL SUBL
SUBLINGUAL_TABLET | SUBLINGUAL | Status: AC
  Filled 2022-10-11: qty 2

## 2022-10-31 DIAGNOSIS — N201 Calculus of ureter: Secondary | ICD-10-CM | POA: Diagnosis not present

## 2022-10-31 DIAGNOSIS — J4520 Mild intermittent asthma, uncomplicated: Secondary | ICD-10-CM | POA: Diagnosis not present

## 2022-10-31 DIAGNOSIS — I10 Essential (primary) hypertension: Secondary | ICD-10-CM | POA: Diagnosis not present

## 2022-11-01 DIAGNOSIS — K449 Diaphragmatic hernia without obstruction or gangrene: Secondary | ICD-10-CM | POA: Diagnosis not present

## 2022-11-01 DIAGNOSIS — I10 Essential (primary) hypertension: Secondary | ICD-10-CM | POA: Diagnosis not present

## 2022-11-01 DIAGNOSIS — Z713 Dietary counseling and surveillance: Secondary | ICD-10-CM | POA: Diagnosis not present

## 2022-11-01 DIAGNOSIS — Z6841 Body Mass Index (BMI) 40.0 and over, adult: Secondary | ICD-10-CM | POA: Diagnosis not present

## 2022-11-03 DIAGNOSIS — D485 Neoplasm of uncertain behavior of skin: Secondary | ICD-10-CM | POA: Diagnosis not present

## 2022-11-03 DIAGNOSIS — L905 Scar conditions and fibrosis of skin: Secondary | ICD-10-CM | POA: Diagnosis not present

## 2022-11-03 DIAGNOSIS — L710 Perioral dermatitis: Secondary | ICD-10-CM | POA: Diagnosis not present

## 2022-11-03 DIAGNOSIS — D225 Melanocytic nevi of trunk: Secondary | ICD-10-CM | POA: Diagnosis not present

## 2022-11-07 DIAGNOSIS — R7303 Prediabetes: Secondary | ICD-10-CM | POA: Diagnosis not present

## 2022-11-10 ENCOUNTER — Ambulatory Visit (INDEPENDENT_AMBULATORY_CARE_PROVIDER_SITE_OTHER): Payer: BC Managed Care – PPO | Admitting: Orthopedic Surgery

## 2022-11-10 ENCOUNTER — Telehealth: Payer: Self-pay | Admitting: Orthopedic Surgery

## 2022-11-10 ENCOUNTER — Encounter: Payer: Self-pay | Admitting: Urology

## 2022-11-10 ENCOUNTER — Other Ambulatory Visit (INDEPENDENT_AMBULATORY_CARE_PROVIDER_SITE_OTHER): Payer: BC Managed Care – PPO

## 2022-11-10 ENCOUNTER — Ambulatory Visit (INDEPENDENT_AMBULATORY_CARE_PROVIDER_SITE_OTHER): Payer: BC Managed Care – PPO | Admitting: Urology

## 2022-11-10 ENCOUNTER — Encounter: Payer: Self-pay | Admitting: Orthopedic Surgery

## 2022-11-10 VITALS — BP 116/61 | HR 91 | Ht 68.0 in | Wt 323.0 lb

## 2022-11-10 DIAGNOSIS — M5410 Radiculopathy, site unspecified: Secondary | ICD-10-CM

## 2022-11-10 DIAGNOSIS — N201 Calculus of ureter: Secondary | ICD-10-CM | POA: Diagnosis not present

## 2022-11-10 DIAGNOSIS — M545 Low back pain, unspecified: Secondary | ICD-10-CM

## 2022-11-10 DIAGNOSIS — N261 Atrophy of kidney (terminal): Secondary | ICD-10-CM

## 2022-11-10 DIAGNOSIS — N132 Hydronephrosis with renal and ureteral calculous obstruction: Secondary | ICD-10-CM

## 2022-11-10 DIAGNOSIS — M1712 Unilateral primary osteoarthritis, left knee: Secondary | ICD-10-CM

## 2022-11-10 DIAGNOSIS — N3941 Urge incontinence: Secondary | ICD-10-CM | POA: Diagnosis not present

## 2022-11-10 DIAGNOSIS — N1330 Unspecified hydronephrosis: Secondary | ICD-10-CM | POA: Diagnosis not present

## 2022-11-10 MED ORDER — PREGABALIN 50 MG PO CAPS: 50.0000 mg | ORAL_CAPSULE | Freq: Every day | ORAL | 2 refills | Status: DC

## 2022-11-10 MED ORDER — MELOXICAM 7.5 MG PO TABS: 7.5000 mg | ORAL_TABLET | Freq: Every day | ORAL | 5 refills | Status: DC

## 2022-11-10 NOTE — Patient Instructions (Addendum)
Physical therapy has been ordered for you at [facility] They should call you to schedule, [phone]  is the phone number to call, if you want to call to schedule.   

## 2022-11-10 NOTE — Telephone Encounter (Signed)
Patient was seen this afternoon by Dr. Romeo Apple.  Her spouse, Francis Dowse [phone] presented to the office after the patient's appointment very upset.  He feels that Dr. Romeo Apple acted like he didn't care and just told his wife to take pills and go to PT.  He is very disappointed and would appreciate a call back.

## 2022-11-10 NOTE — Progress Notes (Signed)
Assessment: 1. Left ureteral stone   2. Hydronephrosis with urinary obstruction due to ureteral calculus   3. Renal atrophy, left   4. Urge incontinence     Plan: Continue solifenacin 10 mg daily  Continue stone prevention Renal U/S in next 3-4 weeks - will call with results Consider lasix renogram for evaluation of split renal function given left renal atrophy Return to office in 3 months  Chief Complaint:  Chief Complaint  Patient presents with   Hydronephrosis    Kidney stones    History of Present Illness:  [name] is a 52 y.o. female who is seen for further evaluation of a left ureteral calculus with obstruction. She has previously seen Dr. Mena Goes for evaluation of a left ureteral calculus. CT imaging from 10/22 showed a left renal cyst, 5 mm obstructing stone in the proximal left ureter with mild hydronephrosis.  Her flank pain resolved.  She was not aware of passing a stone but was not straining her urine.  She had onset of left-sided flank pain again in December 2023.  This resolved spontaneously. Creatinine from 08/24/2022: 1.21 CT  abdomen and pelvis without contrast from 09/13/2022 showed severe left hydronephrosis and ureteral dilation to a 4 mm calculus in the distal left ureter, left parenchymal atrophy noted as compared to study from 10/22.  She was seen in 2/24 with urinary frequency, nocturia, and urinary incontinence. She reported onset of nocturia >1-year ago.  She was having nocturia 2-3 times per night.  She was initially treated with amitriptyline and ropinirole for restless leg syndrome.  She reports an initial improvement in her symptoms.  Since November 2023, she has had worsening of her urinary symptoms.  She reported daytime frequency voiding every 30 minutes to 2 hours, urgency, nocturia up to 8 times per night, and urge incontinence.  No stress incontinence.  No dysuria or gross hematuria.  No recent UTIs.  She was not using any incontinence  pads.  She was not on any medical therapy for her symptoms. She has been diagnosed with sleep apnea but does not use a CPAP.  She underwent cystoscopy, bilateral retrograde pyelograms, left ureteroscopic laser lithotripsy with stone manipulation and insertion of left ureteral stent on 09/20/2022.  The stone was removed completely.  She has a left ureteral stent in place.  She presented on 10/04/2022 for possible stent removal.  She continued on Macrobid daily.  She was also taking Pyridium and Vesicare.  She was having significant stent related symptoms with frequency, urgency, and incontinence.  She also was having some discomfort in the left flank area.  She reported lower extremity swelling and cramping of her upper and lower extremities.  No fever or chills.  No nausea or vomiting.  She is not having gross hematuria. Her urinalysis was suspicious for a possible UTI.  She was given Rocephin 1 g IM and started on cefdinir. Resolve MDX urine culture showed no growth. Creatinine stable at 1.17 on 10/04/22.  Her stent was removed on 10/10/22.  She returns today for follow-up.  She has done well since the stent removal.  No flank pain.  No dysuria or gross hematuria.  Her urinary symptoms are improving.  She has nocturia x 1.  She continues on Solifenacin with control of her urge incontinence.  Portions of the above documentation were copied from a prior visit for review purposes only.   Past Medical History:  Past Medical History:  Diagnosis Date   Acute recurrent streptococcal tonsillitis  12/31/2015   Complication of anesthesia 1999   urinary retention   Fibromyalgia    GERD (gastroesophageal reflux disease)    History of kidney stones    Hyperlipidemia 08/07/2015   Left thyroid nodule 03/31/2013   Melanoma    right hip and knee   Migraine    Neck pain on right side 12/23/2012   Obesity    Palpitations 08/10/2014   Shortness of breath    when anemic   Sinusitis 08/07/2015   with  allergies   Sleep apnea    C-Pap    Past Surgical History:  Past Surgical History:  Procedure Laterality Date   59 HOUR PH STUDY N/A 04/12/2017   Procedure: 24 HOUR PH STUDY WITH IMPEDIANCE;  Surgeon: Napoleon Form, MD;  Location: [facility];  Service: Endoscopy;  Laterality: N/A;   ABDOMINAL HYSTERECTOMY  2013   BACK SURGERY  1999   BILATERAL SALPINGECTOMY Bilateral 09/13/2012   Procedure: BILATERAL SALPINGECTOMY;  Surgeon: Willodean Rosenthal, MD;  Location: [facility];  Service: Gynecology;  Laterality: Bilateral;   CHOLECYSTECTOMY  2012   COLONOSCOPY     CYSTOSCOPY/URETEROSCOPY/HOLMIUM LASER/STENT PLACEMENT Left 09/20/2022   Procedure: CYSTOSCOPY/URETEROSCOPY/HOLMIUM LASER/STENT PLACEMENT;  Surgeon: Milderd Meager., MD;  Location: [facility];  Service: Urology;  Laterality: Left;   ESOPHAGEAL MANOMETRY N/A 04/12/2017   Procedure: ESOPHAGEAL MANOMETRY (EM);  Surgeon: Napoleon Form, MD;  Location: [facility];  Service: Endoscopy;  Laterality: N/A;   LAPAROSCOPIC ASSISTED VAGINAL HYSTERECTOMY N/A 09/13/2012   Procedure: LAPAROSCOPIC ASSISTED VAGINAL HYSTERECTOMY;  Surgeon: Willodean Rosenthal, MD;  Location: [facility];  Service: Gynecology;  Laterality: N/A;   MELANOMA EXCISION  2011   stage I and II   WISDOM TOOTH EXTRACTION  1989    Allergies:  Allergies  Allergen Reactions   Seroquel [Quetiapine Fumarate] Nausea And Vomiting    Nightmares and vomiting    Adhesive [Tape] Dermatitis    Band- Aid adhesive   Aleve [Naproxen Sodium]     Makes hands, legs, and feet swell   Carbamazepine     Hallucinations and dizziness   Gabapentin Other (See Comments)    Weight gain   Mold Extract [Trichophyton]     Family History:  Family History  Problem Relation Age of Onset   Colitis Father        crohn's   Heart disease Father        grandfather   Heart attack Father 7   Arthritis Father        s/p back surgery   Other Mother        neurological autoimmune  disease, chorea in trunk   Eczema Mother    Asthma Mother    Arthritis Maternal Grandmother    Lupus Maternal Grandmother    Dementia Maternal Grandmother    CVA Maternal Grandmother    Arthritis Paternal Grandmother    Heart disease Paternal Grandmother        chf   Asthma Paternal Grandmother    Diabetes Paternal Grandfather    Kidney disease Paternal Grandfather    Hypertension Paternal Grandfather    Hyperlipidemia Paternal Grandfather    Cancer Maternal Grandfather        bone   Diabetes Unknown        grandfather   Irritable bowel syndrome Unknown    Kidney disease Unknown        grandfather   Arthritis Other    Cancer Other        breast  Hyperlipidemia Other    Mental illness Other    Emphysema Maternal Uncle    Colon cancer Neg Hx    Allergic rhinitis Neg Hx    Angioedema Neg Hx    Immunodeficiency Neg Hx    Urticaria Neg Hx     Social History:  Social History   Tobacco Use   Smoking status: Never   Smokeless tobacco: Never   Tobacco comments:    never used tobacco  Vaping Use   Vaping Use: Never used  Substance Use Topics   Alcohol use: No    Alcohol/week: 0.0 standard drinks of alcohol   Drug use: No    ROS: Constitutional:  Negative for fever, chills, weight loss CV: Negative for chest pain, previous MI, hypertension Respiratory:  Negative for shortness of breath, wheezing, sleep apnea, frequent cough GI:  Negative for nausea, vomiting, bloody stool, GERD  Physical exam: BP 116/61   Pulse 91   Ht  (1.727 m)   Wt (!) 323 lb (146.5 kg)   LMP 08/13/2012   BMI 49.11 kg/m  GENERAL APPEARANCE:  Well appearing, well developed, well nourished, NAD HEENT:  Atraumatic, normocephalic, oropharynx clear NECK:  Supple without lymphadenopathy or thyromegaly ABDOMEN:  Soft, non-tender, no masses EXTREMITIES:  Moves all extremities well, without clubbing, cyanosis, or edema NEUROLOGIC:  Alert and oriented x 3, normal gait, CN II-XII grossly  intact MENTAL STATUS:  appropriate BACK:  Non-tender to palpation, No CVAT SKIN:  Warm, dry, and intact  Results: U/A: negative

## 2022-11-10 NOTE — Progress Notes (Signed)
Office Visit Note   Patient: [name]           Date of Birth: 24-Aug-1970           MRN: [id number] Visit Date: 11/10/2022 Requested by: Assunta Found, MD [id number] [place],  [place] [address] PCP: Assunta Found, MD   Assessment & Plan: 52 year old female with history of lumbar disc disease had decompression years ago did well has recurrent symptoms of back knee and foot pain with numbness on the dorsum of her foot failed trial of gabapentin has been to multiple physicians who could not really diagnose her issue seems to have radicular pain in her left leg as well as some knee arthritis  She is willing to try a course of Lyrica and meloxicam  However she has also been recently diagnosed with hypertension and we are battling BMI of 48.6   Our treatment plan is as follows  Physical therapy  Medical management  Recheck in 2 months to see if there is been any complications from the medication and if they are getting any results in terms of her pain and symptoms    Meds ordered this encounter  Medications   pregabalin (LYRICA) 50 MG capsule    Sig: Take 1 capsule (50 mg total) by mouth daily.    Dispense:  30 capsule    Refill:  2   meloxicam (MOBIC) 7.5 MG tablet    Sig: Take 1 tablet (7.5 mg total) by mouth daily.    Dispense:  30 tablet    Refill:  5     Subjective: Chief Complaint  Patient presents with   Foot Pain    Had appointment scheduled for the left knee but today has left foot pain which is worse and would like to be seen for the left foot today. States burning pains in toes had negative nerve study at headache doctor years ago no further was done for it than.     HPI: 52 year old female status post lumbar disc surgery over 10 years ago presents with back pain lateral thigh pain numbness on the dorsum of her foot involving the middle 3 digits and medial thigh pain              ROS: Fatigue, tinnitus, blurred vision, leg swelling, pain in  legs after walking, shortness of breath heartburn, urgency frequency flank pain with recent kidney surgery to relieve blocked ureter  Muscle aches neck back and joint pain dizziness headache sensory changes most notable dorsum of the foot on the left side pollen allergy   Images personally read and my interpretation : Internal imaging shows scoliosis and L4-5 complete disc space narrowing with L5-S1 facet arthritis and mild disc space narrowing at L5-S1  Visit Diagnoses:  1. Lumbar pain   2. Radicular pain of left lower extremity   3. Primary osteoarthritis of left knee      Follow-Up Instructions: Return in about 2 months (around 01/10/2023) for FOLLOW UP, BACK.    Objective: Vital Signs: BP (!) 150/94   Pulse 98   Ht 5\' 8"  (1.727 m)   Wt (!) 320 lb (145.2 kg)   LMP 08/13/2012   BMI 48.66 kg/m   Physical Exam Vitals and nursing note reviewed.  Constitutional:      General: She is not in acute distress.    Appearance: Normal appearance. She is obese. She is not ill-appearing, toxic-appearing or diaphoretic.  HENT:     Head: Normocephalic and atraumatic.  Nose: Nose normal. No congestion or rhinorrhea.  Eyes:     General: No scleral icterus.       Right eye: No discharge.        Left eye: No discharge.     Extraocular Movements: Extraocular movements intact.     Conjunctiva/sclera: Conjunctivae normal.     Pupils: Pupils are equal, round, and reactive to light.  Cardiovascular:     Pulses: Normal pulses.  Pulmonary:     Effort: Pulmonary effort is normal.     Breath sounds: No wheezing.  Skin:    General: Skin is warm and dry.     Capillary Refill: Capillary refill takes less than 2 seconds.     Coloration: Skin is not jaundiced.     Findings: No erythema.  Neurological:     General: No focal deficit present.     Mental Status: She is alert and oriented to person, place, and time.  Psychiatric:        Mood and Affect: Mood normal.        Behavior: Behavior  normal.        Thought Content: Thought content normal.        Judgment: Judgment normal.      Back Exam   Tenderness  The patient is experiencing tenderness in the lumbar.  Range of Motion  Extension:  abnormal  Flexion:  abnormal   Muscle Strength  Right Quadriceps:  5/5  Left Quadriceps:  5/5   Reflexes  Patellar:  normal Achilles:  normal  Other  Sensation: normal Gait: normal   Comments:  Plantarflexion and dorsiflexion strength are normal       Specialty Comments:  No specialty comments available.  Imaging: DG Lumbar Spine 2-3 Views  Result Date: 11/10/2022 Imaging of the lumbar spine Constellation of symptoms including lower back pain left knee pain and tingling in the left foot X-ray shows narrowing of L4-5 narrowing of L5 1 spondylosis of the facet joints in this area scoliosis increased lumbar lordosis Impression spondylosis lumbar spine with severe disc space narrowing at L4-5 degenerative changes at L5-S1 and facet arthritis at throughout the lower 2 segments     PMFS History: Patient Active Problem List   Diagnosis Date Noted   Left ureteral stone 09/16/2022   Hydronephrosis with urinary obstruction due to ureteral calculus 09/16/2022   Renal atrophy, left 09/16/2022   Urge incontinence 08/29/2022   Nephrolithiasis 08/29/2022   Family history of early CAD 11/11/2021   Dyspnea 11/11/2021   Anesthesia of skin 09/02/2019   Hypersomnia 09/02/2019   Neuropathic pain 09/02/2019   Snoring 09/02/2019   Asthma, well controlled, mild intermittent 03/07/2019   Degenerative disc disease, lumbar 03/07/2019   Fatty liver 03/07/2019   History of lumbar fusion 03/07/2019   Hematemesis without nausea 08/17/2017   Abdominal pain, epigastric 08/17/2017   Regurgitation of food    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   Vasomotor symptoms due to menopause 11/23/2016   History of melanoma 10/28/2016   DJD (degenerative joint disease), cervical 10/25/2016    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   Primary osteoarthritis of both hands 10/25/2016   Primary osteoarthritis of left knee 10/25/2016   Fibromyalgia 10/25/2016   Allergic rhinitis with a probable non-allergic component 10/24/2016   ANA positive 10/21/2016   Elevated sed rate 05/29/2016   Bipolar depression 05/29/2016   Fever 05/23/2016   Other complicated headache syndrome 05/23/2016   Rash and nonspecific skin eruption 05/23/2016   Hyperglycemia 03/18/2016  Diarrhea 03/18/2016   Abdominal pain 03/18/2016   Tinnitus of both ears 02/15/2016   Perimenopausal 01/31/2016   Vitamin D deficiency 01/31/2016   Myalgia 01/31/2016   Acute recurrent pansinusitis 12/31/2015   Acute recurrent streptococcal tonsillitis 12/31/2015   Skin lesion of breast 12/06/2015   Nausea and vomiting 08/12/2015   Dysphagia 08/12/2015   Peptic stricture of esophagus 08/12/2015   Hyperlipidemia 08/07/2015   Left foot pain 08/07/2015   Recurrent sinusitis 08/07/2015   Back pain 08/19/2014   Palpitations 08/10/2014   Pain 03/31/2013   Neck pain on right side 12/23/2012   Fibroids 07/04/2012   Menorrhagia 06/25/2012   Hypertriglyceridemia 04/23/2012   Chest pain of uncertain etiology 12/15/2011   Annual physical exam 12/15/2011   Obesity 12/10/2010   Lower extremity edema 12/10/2010   Iron deficiency anemia 08/18/2009   GERD 08/18/2009   Past Medical History:  Diagnosis Date   Acute recurrent streptococcal tonsillitis 12/31/2015   Complication of anesthesia 1999   urinary retention   Fibromyalgia    GERD (gastroesophageal reflux disease)    History of kidney stones    Hyperlipidemia 08/07/2015   Left thyroid nodule 03/31/2013   Melanoma    right hip and knee   Migraine    Neck pain on right side 12/23/2012   Obesity    Palpitations 08/10/2014   Shortness of breath    when anemic   Sinusitis 08/07/2015   with allergies   Sleep apnea    C-Pap    Family History  Problem Relation Age of Onset    Colitis Father        crohn's   Heart disease Father        grandfather   Heart attack Father 66   Arthritis Father        s/p back surgery   Other Mother        neurological autoimmune disease, chorea in trunk   Eczema Mother    Asthma Mother    Arthritis Maternal Grandmother    Lupus Maternal Grandmother    Dementia Maternal Grandmother    CVA Maternal Grandmother    Arthritis Paternal Grandmother    Heart disease Paternal Grandmother        chf   Asthma Paternal Grandmother    Diabetes Paternal Grandfather    Kidney disease Paternal Grandfather    Hypertension Paternal Grandfather    Hyperlipidemia Paternal Grandfather    Cancer Maternal Grandfather        bone   Diabetes Unknown        grandfather   Irritable bowel syndrome Unknown    Kidney disease Unknown        grandfather   Arthritis Other    Cancer Other        breast   Hyperlipidemia Other    Mental illness Other    Emphysema Maternal Uncle    Colon cancer Neg Hx    Allergic rhinitis Neg Hx    Angioedema Neg Hx    Immunodeficiency Neg Hx    Urticaria Neg Hx     Past Surgical History:  Procedure Laterality Date   31 HOUR PH STUDY N/A 04/12/2017   Procedure: 24 HOUR PH STUDY WITH IMPEDIANCE;  Surgeon: Napoleon Form, MD;  Location: [facility];  Service: Endoscopy;  Laterality: N/A;   ABDOMINAL HYSTERECTOMY  2013   BACK SURGERY  1999   BILATERAL SALPINGECTOMY Bilateral 09/13/2012   Procedure: BILATERAL SALPINGECTOMY;  Surgeon: Willodean Rosenthal, MD;  Location: [facility];  Service: Gynecology;  Laterality: Bilateral;   CHOLECYSTECTOMY  2012   COLONOSCOPY     CYSTOSCOPY/URETEROSCOPY/HOLMIUM LASER/STENT PLACEMENT Left 09/20/2022   Procedure: CYSTOSCOPY/URETEROSCOPY/HOLMIUM LASER/STENT PLACEMENT;  Surgeon: Milderd Meager., MD;  Location: [facility];  Service: Urology;  Laterality: Left;   ESOPHAGEAL MANOMETRY N/A 04/12/2017   Procedure: ESOPHAGEAL MANOMETRY (EM);  Surgeon: Napoleon Form, MD;   Location: [facility];  Service: Endoscopy;  Laterality: N/A;   LAPAROSCOPIC ASSISTED VAGINAL HYSTERECTOMY N/A 09/13/2012   Procedure: LAPAROSCOPIC ASSISTED VAGINAL HYSTERECTOMY;  Surgeon: Willodean Rosenthal, MD;  Location: [facility];  Service: Gynecology;  Laterality: N/A;   MELANOMA EXCISION  2011   stage I and II   WISDOM TOOTH EXTRACTION  1989   Social History   Occupational History   Occupation: unemployed  Tobacco Use   Smoking status: Never   Smokeless tobacco: Never   Tobacco comments:    never used tobacco  Vaping Use   Vaping Use: Never used  Substance and Sexual Activity   Alcohol use: No    Alcohol/week: 0.0 standard drinks of alcohol   Drug use: No   Sexual activity: Yes    Birth control/protection: None

## 2022-11-11 LAB — URINALYSIS, ROUTINE W REFLEX MICROSCOPIC
Bilirubin, UA: NEGATIVE
Glucose, UA: NEGATIVE
Ketones, UA: NEGATIVE
Leukocytes,UA: NEGATIVE
Nitrite, UA: NEGATIVE
Protein,UA: NEGATIVE
RBC, UA: NEGATIVE
Specific Gravity, UA: 1.02 (ref 1.005–1.030)
Urobilinogen, Ur: 0.2 mg/dL (ref 0.2–1.0)
pH, UA: 5.5 (ref 5.0–7.5)

## 2022-11-14 DIAGNOSIS — Z713 Dietary counseling and surveillance: Secondary | ICD-10-CM | POA: Diagnosis not present

## 2022-11-14 DIAGNOSIS — Z9884 Bariatric surgery status: Secondary | ICD-10-CM | POA: Diagnosis not present

## 2022-11-14 NOTE — Telephone Encounter (Signed)
I called,discussed 

## 2022-11-21 ENCOUNTER — Encounter: Payer: Self-pay | Admitting: Urology

## 2022-11-21 ENCOUNTER — Ambulatory Visit (HOSPITAL_BASED_OUTPATIENT_CLINIC_OR_DEPARTMENT_OTHER)
Admission: RE | Admit: 2022-11-21 | Discharge: 2022-11-21 | Disposition: A | Discharge status: Home / Self Care | Payer: BC Managed Care – PPO | Source: Ambulatory Visit | Attending: Urology | Admitting: Urology

## 2022-11-21 DIAGNOSIS — N281 Cyst of kidney, acquired: Secondary | ICD-10-CM | POA: Diagnosis not present

## 2022-11-21 DIAGNOSIS — N261 Atrophy of kidney (terminal): Secondary | ICD-10-CM

## 2022-11-21 DIAGNOSIS — N132 Hydronephrosis with renal and ureteral calculous obstruction: Secondary | ICD-10-CM | POA: Diagnosis not present

## 2022-11-21 NOTE — Addendum Note (Signed)
Addended by: Milderd Meager on: 11/21/2022 02:26 PM   Modules accepted: Orders

## 2022-11-23 ENCOUNTER — Ambulatory Visit (INDEPENDENT_AMBULATORY_CARE_PROVIDER_SITE_OTHER): Payer: BC Managed Care – PPO | Admitting: Podiatry

## 2022-11-23 ENCOUNTER — Ambulatory Visit (INDEPENDENT_AMBULATORY_CARE_PROVIDER_SITE_OTHER): Payer: BC Managed Care – PPO

## 2022-11-23 ENCOUNTER — Encounter: Payer: Self-pay | Admitting: Hematology & Oncology

## 2022-11-23 DIAGNOSIS — R52 Pain, unspecified: Secondary | ICD-10-CM | POA: Diagnosis not present

## 2022-11-23 DIAGNOSIS — M5416 Radiculopathy, lumbar region: Secondary | ICD-10-CM | POA: Diagnosis not present

## 2022-11-23 NOTE — Progress Notes (Signed)
Chief Complaint  Patient presents with   Foot Pain    Foot pain located in the left foot, 2nd-4th digits are painful and have a burning sensation, patient describes the toes as bones are breaking when walking     HPI: 52 y.o. female presenting today as a new patient for evaluation of pain and tenderness associated to the left foot this been ongoing for over 10 years now.  She does have a history of DDD and lumbar surgery.  She experiences shooting sensations down her thigh with increased pain in her knee and foot.  She says that she will experience sharp shooting stabbing sensation to the left foot which goes away after few minutes.  It is slowly becoming more frequent.  She has had nerve conduction study and arterial Dopplers negative for any significant findings  Past Medical History:  Diagnosis Date   Acute recurrent streptococcal tonsillitis 12/31/2015   Complication of anesthesia 1999   urinary retention   Fibromyalgia    GERD (gastroesophageal reflux disease)    History of kidney stones    Hyperlipidemia 08/07/2015   Left thyroid nodule 03/31/2013   Melanoma (HCC)    right hip and knee   Migraine    Neck pain on right side 12/23/2012   Obesity    Palpitations 08/10/2014   Shortness of breath    when anemic   Sinusitis 08/07/2015   with allergies   Sleep apnea    C-Pap    Past Surgical History:  Procedure Laterality Date   70 HOUR PH STUDY N/A 04/12/2017   Procedure: 24 HOUR PH STUDY WITH IMPEDIANCE;  Surgeon: Napoleon Form, MD;  Location: [facility];  Service: Endoscopy;  Laterality: N/A;   ABDOMINAL HYSTERECTOMY  2013   BACK SURGERY  1999   BILATERAL SALPINGECTOMY Bilateral 09/13/2012   Procedure: BILATERAL SALPINGECTOMY;  Surgeon: Willodean Rosenthal, MD;  Location: [facility];  Service: Gynecology;  Laterality: Bilateral;   CHOLECYSTECTOMY  2012   COLONOSCOPY     CYSTOSCOPY/URETEROSCOPY/HOLMIUM LASER/STENT PLACEMENT Left 09/20/2022   Procedure:  CYSTOSCOPY/URETEROSCOPY/HOLMIUM LASER/STENT PLACEMENT;  Surgeon: Milderd Meager., MD;  Location: [facility];  Service: Urology;  Laterality: Left;   ESOPHAGEAL MANOMETRY N/A 04/12/2017   Procedure: ESOPHAGEAL MANOMETRY (EM);  Surgeon: Napoleon Form, MD;  Location: [facility];  Service: Endoscopy;  Laterality: N/A;   LAPAROSCOPIC ASSISTED VAGINAL HYSTERECTOMY N/A 09/13/2012   Procedure: LAPAROSCOPIC ASSISTED VAGINAL HYSTERECTOMY;  Surgeon: Willodean Rosenthal, MD;  Location: [facility];  Service: Gynecology;  Laterality: N/A;   MELANOMA EXCISION  2011   stage I and II   WISDOM TOOTH EXTRACTION  1989    Allergies  Allergen Reactions   Seroquel [Quetiapine Fumarate] Nausea And Vomiting    Nightmares and vomiting    Adhesive [Tape] Dermatitis    Band- Aid adhesive   Aleve [Naproxen Sodium]     Makes hands, legs, and feet swell   Carbamazepine     Hallucinations and dizziness   Gabapentin Other (See Comments)    Weight gain   Mold Extract [Trichophyton]      Physical Exam: General: The patient is alert and oriented x3 in no acute distress.  Dermatology: Skin is warm, dry and supple bilateral lower extremities.   Vascular: Palpable pedal pulses bilaterally. Capillary refill within normal limits.  No appreciable edema.  No erythema.  Neurological: Grossly intact via light touch  Musculoskeletal Exam: No pedal deformities noted  Radiographic Exam LT foot 11/23/2022:  Normal osseous mineralization. Joint spaces preserved.  No fractures or osseous irregularities noted.  Impression: Negative  Assessment/Plan of Care: 1.  Lumbar radiculopathy LLE  -Patient evaluated.  X-rays reviewed -After discussing with the patient I do believe that she is experiencing lumbar radiculopathy since the pain radiates into her leg, knee, thigh.  She is closely monitored with Dr. Tressie Stalker, neurosurgery -Patient does not want any additional medication to alleviate her symptoms.  She says  that she does have a history of taking gabapentin in the past which did not help alleviate any of her symptoms and only made her groggy and gain weight -Unfortunately I do not have any additional modalities that would help alleviate her pain other than possible orthotics to help with alignment and posture.  This was discussed but ultimately declined.  Patient prefers to wear sandals throughout the day -Turn to clinic as needed       Felecia Shelling, DPM [facility]  Dr. Felecia Shelling, DPM    [address] [place], [place] [address]                Office [phone]  Fax [phone]

## 2022-11-24 DIAGNOSIS — L98429 Non-pressure chronic ulcer of back with unspecified severity: Secondary | ICD-10-CM | POA: Diagnosis not present

## 2022-11-24 DIAGNOSIS — D485 Neoplasm of uncertain behavior of skin: Secondary | ICD-10-CM | POA: Diagnosis not present

## 2022-11-28 DIAGNOSIS — K219 Gastro-esophageal reflux disease without esophagitis: Secondary | ICD-10-CM | POA: Diagnosis not present

## 2022-11-28 DIAGNOSIS — R1110 Vomiting, unspecified: Secondary | ICD-10-CM | POA: Diagnosis not present

## 2022-11-28 DIAGNOSIS — R12 Heartburn: Secondary | ICD-10-CM | POA: Diagnosis not present

## 2022-11-29 ENCOUNTER — Encounter: Payer: Self-pay | Admitting: Hematology & Oncology

## 2022-11-30 DIAGNOSIS — R12 Heartburn: Secondary | ICD-10-CM | POA: Diagnosis not present

## 2022-11-30 DIAGNOSIS — K449 Diaphragmatic hernia without obstruction or gangrene: Secondary | ICD-10-CM | POA: Diagnosis not present

## 2022-11-30 DIAGNOSIS — R1110 Vomiting, unspecified: Secondary | ICD-10-CM | POA: Diagnosis not present

## 2022-12-01 ENCOUNTER — Encounter (HOSPITAL_COMMUNITY)
Admission: RE | Admit: 2022-12-01 | Discharge: 2022-12-01 | Disposition: A | Discharge status: Home / Self Care | Payer: BC Managed Care – PPO | Source: Ambulatory Visit | Attending: Urology | Admitting: Urology

## 2022-12-01 DIAGNOSIS — N261 Atrophy of kidney (terminal): Secondary | ICD-10-CM | POA: Diagnosis not present

## 2022-12-01 DIAGNOSIS — N132 Hydronephrosis with renal and ureteral calculous obstruction: Secondary | ICD-10-CM | POA: Diagnosis not present

## 2022-12-01 DIAGNOSIS — N201 Calculus of ureter: Secondary | ICD-10-CM | POA: Diagnosis not present

## 2022-12-01 DIAGNOSIS — K219 Gastro-esophageal reflux disease without esophagitis: Secondary | ICD-10-CM | POA: Diagnosis not present

## 2022-12-01 MED ORDER — TECHNETIUM TC 99M MERTIATIDE
5.0000 | Freq: Once | INTRAVENOUS | Status: AC | PRN
  Administered 2022-12-01: 5.5 via INTRAVENOUS

## 2022-12-01 MED ORDER — STERILE WATER FOR INJECTION IJ SOLN
INTRAMUSCULAR | Status: AC
  Filled 2022-12-01: qty 10

## 2022-12-01 MED ORDER — FUROSEMIDE 10 MG/ML IJ SOLN
INTRAMUSCULAR | Status: AC
  Filled 2022-12-01: qty 4

## 2022-12-01 MED ORDER — FUROSEMIDE 10 MG/ML IJ SOLN
40.0000 mg | Freq: Once | INTRAMUSCULAR | Status: AC
  Administered 2022-12-01: 40 mg via INTRAVENOUS

## 2022-12-08 ENCOUNTER — Encounter: Payer: Self-pay | Admitting: Urology

## 2022-12-14 DIAGNOSIS — Z6841 Body Mass Index (BMI) 40.0 and over, adult: Secondary | ICD-10-CM | POA: Diagnosis not present

## 2022-12-14 DIAGNOSIS — Z87442 Personal history of urinary calculi: Secondary | ICD-10-CM | POA: Diagnosis not present

## 2022-12-14 DIAGNOSIS — Z713 Dietary counseling and surveillance: Secondary | ICD-10-CM | POA: Diagnosis not present

## 2022-12-14 DIAGNOSIS — K219 Gastro-esophageal reflux disease without esophagitis: Secondary | ICD-10-CM | POA: Diagnosis not present

## 2023-01-05 DIAGNOSIS — Z1231 Encounter for screening mammogram for malignant neoplasm of breast: Secondary | ICD-10-CM | POA: Diagnosis not present

## 2023-01-09 ENCOUNTER — Ambulatory Visit: Payer: BC Managed Care – PPO | Admitting: Orthopedic Surgery

## 2023-01-11 DIAGNOSIS — K219 Gastro-esophageal reflux disease without esophagitis: Secondary | ICD-10-CM | POA: Diagnosis not present

## 2023-01-11 DIAGNOSIS — K449 Diaphragmatic hernia without obstruction or gangrene: Secondary | ICD-10-CM | POA: Diagnosis not present

## 2023-01-11 DIAGNOSIS — G4733 Obstructive sleep apnea (adult) (pediatric): Secondary | ICD-10-CM | POA: Diagnosis not present

## 2023-02-01 DIAGNOSIS — H524 Presbyopia: Secondary | ICD-10-CM | POA: Diagnosis not present

## 2023-02-01 DIAGNOSIS — Z01818 Encounter for other preprocedural examination: Secondary | ICD-10-CM | POA: Diagnosis not present

## 2023-02-01 DIAGNOSIS — H40001 Preglaucoma, unspecified, right eye: Secondary | ICD-10-CM | POA: Diagnosis not present

## 2023-02-07 DIAGNOSIS — Z713 Dietary counseling and surveillance: Secondary | ICD-10-CM | POA: Diagnosis not present

## 2023-02-09 ENCOUNTER — Encounter: Payer: Self-pay | Admitting: Urology

## 2023-02-09 ENCOUNTER — Ambulatory Visit (INDEPENDENT_AMBULATORY_CARE_PROVIDER_SITE_OTHER): Payer: BC Managed Care – PPO | Admitting: Urology

## 2023-02-09 VITALS — BP 122/69 | HR 89 | Ht 68.0 in

## 2023-02-09 DIAGNOSIS — N3941 Urge incontinence: Secondary | ICD-10-CM

## 2023-02-09 DIAGNOSIS — N201 Calculus of ureter: Secondary | ICD-10-CM | POA: Diagnosis not present

## 2023-02-09 DIAGNOSIS — N261 Atrophy of kidney (terminal): Secondary | ICD-10-CM | POA: Diagnosis not present

## 2023-02-09 DIAGNOSIS — N200 Calculus of kidney: Secondary | ICD-10-CM

## 2023-02-09 LAB — BLADDER SCAN AMB NON-IMAGING

## 2023-02-09 LAB — URINALYSIS, ROUTINE W REFLEX MICROSCOPIC
Bilirubin, UA: NEGATIVE
Glucose, UA: NEGATIVE
Ketones, UA: NEGATIVE
Leukocytes,UA: NEGATIVE
Nitrite, UA: NEGATIVE
Protein,UA: NEGATIVE
RBC, UA: NEGATIVE
Specific Gravity, UA: 1.025 (ref 1.005–1.030)
Urobilinogen, Ur: 0.2 mg/dL (ref 0.2–1.0)
pH, UA: 5.5 (ref 5.0–7.5)

## 2023-02-09 NOTE — Progress Notes (Signed)
Assessment: 1. Renal atrophy, left   2. Urge incontinence   3. Nephrolithiasis    Plan: I reviewed reviewed the results of the renal ultrasound and the Lasix renogram with the patient today. Continue solifenacin 10 mg daily  Continue stone prevention Continue timed and double voiding. Return to office in 6 months  Chief Complaint:  Chief Complaint  Patient presents with   Renal atrophy    History of Present Illness:  [name] is a 52 y.o. female who is seen for further evaluation of a left ureteral calculus with obstruction. She has previously seen Dr. Mena Goes for evaluation of a left ureteral calculus. CT imaging from 10/22 showed a left renal cyst, 5 mm obstructing stone in the proximal left ureter with mild hydronephrosis.  Her flank pain resolved.  She was not aware of passing a stone but was not straining her urine.  She had onset of left-sided flank pain again in December 2023.  This resolved spontaneously. Creatinine from 08/24/2022: 1.21 CT  abdomen and pelvis without contrast from 09/13/2022 showed severe left hydronephrosis and ureteral dilation to a 4 mm calculus in the distal left ureter, left parenchymal atrophy noted as compared to study from 10/22.  She was seen in 2/24 with urinary frequency, nocturia, and urinary incontinence. She reported onset of nocturia >1-year ago.  She was having nocturia 2-3 times per night.  She was initially treated with amitriptyline and ropinirole for restless leg syndrome.  She reports an initial improvement in her symptoms.  Since November 2023, she has had worsening of her urinary symptoms.  She reported daytime frequency voiding every 30 minutes to 2 hours, urgency, nocturia up to 8 times per night, and urge incontinence.  No stress incontinence.  No dysuria or gross hematuria.  No recent UTIs.  She was not using any incontinence pads.  She was not on any medical therapy for her symptoms. She has been diagnosed with sleep apnea  but does not use a CPAP.  She underwent cystoscopy, bilateral retrograde pyelograms, left ureteroscopic laser lithotripsy with stone manipulation and insertion of left ureteral stent on 09/20/2022.  The stone was removed completely.  She has a left ureteral stent in place.  She presented on 10/04/2022 for possible stent removal.  She continued on Macrobid daily.  She was also taking Pyridium and Vesicare.  She was having significant stent related symptoms with frequency, urgency, and incontinence.  She also was having some discomfort in the left flank area.  She reported lower extremity swelling and cramping of her upper and lower extremities.  No fever or chills.  No nausea or vomiting.  She is not having gross hematuria. Her urinalysis was suspicious for a possible UTI.  She was given Rocephin 1 g IM and started on cefdinir. Resolve MDX urine culture showed no growth. Creatinine stable at 1.17 on 10/04/22.  Her stent was removed on 10/10/22.  At her visit in 4/24, she was doing well. No flank pain.  No dysuria or gross hematuria.  Her urinary symptoms were improved.  She continued with nocturia x 1.  She continued on Solifenacin with control of her urge incontinence. Renal ultrasound from 11/21/2022 showed no hydronephrosis and a left renal cyst. Lasix renogram from 12/01/2022 showed split renal function of 19% on the left and 81% on the right without evidence of obstruction to either kidney.  He returns today for follow-up.  She has not had any flank pain.  No dysuria or gross hematuria.  She  continues on Solifenacin 10 mg daily.  She reports good control of her incontinence symptoms.  She continues to have nocturia x 1.  She reports recent symptoms of sensation of incomplete emptying.  She is able to void an additional amount if she remains on the toilet.   Portions of the above documentation were copied from a prior visit for review purposes only.   Past Medical History:  Past Medical History:   Diagnosis Date   Acute recurrent streptococcal tonsillitis 12/31/2015   Complication of anesthesia 1999   urinary retention   Fibromyalgia    GERD (gastroesophageal reflux disease)    History of kidney stones    Hyperlipidemia 08/07/2015   Left thyroid nodule 03/31/2013   Melanoma (HCC)    right hip and knee   Migraine    Neck pain on right side 12/23/2012   Obesity    Palpitations 08/10/2014   Shortness of breath    when anemic   Sinusitis 08/07/2015   with allergies   Sleep apnea    C-Pap    Past Surgical History:  Past Surgical History:  Procedure Laterality Date   24 HOUR PH STUDY N/A 04/12/2017   Procedure: 24 HOUR PH STUDY WITH IMPEDIANCE;  Surgeon: Napoleon Form, MD;  Location: [facility];  Service: Endoscopy;  Laterality: N/A;   ABDOMINAL HYSTERECTOMY  2013   BACK SURGERY  1999   BILATERAL SALPINGECTOMY Bilateral 09/13/2012   Procedure: BILATERAL SALPINGECTOMY;  Surgeon: Willodean Rosenthal, MD;  Location: [facility];  Service: Gynecology;  Laterality: Bilateral;   CHOLECYSTECTOMY  2012   COLONOSCOPY     CYSTOSCOPY/URETEROSCOPY/HOLMIUM LASER/STENT PLACEMENT Left 09/20/2022   Procedure: CYSTOSCOPY/URETEROSCOPY/HOLMIUM LASER/STENT PLACEMENT;  Surgeon: Milderd Meager., MD;  Location: [facility];  Service: Urology;  Laterality: Left;   ESOPHAGEAL MANOMETRY N/A 04/12/2017   Procedure: ESOPHAGEAL MANOMETRY (EM);  Surgeon: Napoleon Form, MD;  Location: [facility];  Service: Endoscopy;  Laterality: N/A;   LAPAROSCOPIC ASSISTED VAGINAL HYSTERECTOMY N/A 09/13/2012   Procedure: LAPAROSCOPIC ASSISTED VAGINAL HYSTERECTOMY;  Surgeon: Willodean Rosenthal, MD;  Location: [facility];  Service: Gynecology;  Laterality: N/A;   MELANOMA EXCISION  2011   stage I and II   WISDOM TOOTH EXTRACTION  1989    Allergies:  Allergies  Allergen Reactions   Seroquel [Quetiapine Fumarate] Nausea And Vomiting    Nightmares and vomiting    Adhesive [Tape] Dermatitis    Band-  Aid adhesive   Aleve [Naproxen Sodium]     Makes hands, legs, and feet swell   Carbamazepine     Hallucinations and dizziness   Gabapentin Other (See Comments)    Weight gain   Mold Extract [Trichophyton]     Family History:  Family History  Problem Relation Age of Onset   Colitis Father        crohn's   Heart disease Father        grandfather   Heart attack Father 12   Arthritis Father        s/p back surgery   Other Mother        neurological autoimmune disease, chorea in trunk   Eczema Mother    Asthma Mother    Arthritis Maternal Grandmother    Lupus Maternal Grandmother    Dementia Maternal Grandmother    CVA Maternal Grandmother    Arthritis Paternal Grandmother    Heart disease Paternal Grandmother        chf   Asthma Paternal Grandmother    Diabetes Paternal Actor  Kidney disease Paternal Grandfather    Hypertension Paternal Grandfather    Hyperlipidemia Paternal Grandfather    Cancer Maternal Grandfather        bone   Diabetes Unknown        grandfather   Irritable bowel syndrome Unknown    Kidney disease Unknown        grandfather   Arthritis Other    Cancer Other        breast   Hyperlipidemia Other    Mental illness Other    Emphysema Maternal Uncle    Colon cancer Neg Hx    Allergic rhinitis Neg Hx    Angioedema Neg Hx    Immunodeficiency Neg Hx    Urticaria Neg Hx     Social History:  Social History   Tobacco Use   Smoking status: Never   Smokeless tobacco: Never   Tobacco comments:    never used tobacco  Vaping Use   Vaping status: Never Used  Substance Use Topics   Alcohol use: No    Alcohol/week: 0.0 standard drinks of alcohol   Drug use: No    ROS: Constitutional:  Negative for fever, chills, weight loss CV: Negative for chest pain, previous MI, hypertension Respiratory:  Negative for shortness of breath, wheezing, sleep apnea, frequent cough GI:  Negative for nausea, vomiting, bloody stool, GERD  Physical  exam: BP 122/69   Pulse 89   Ht 5\' 8"  (1.727 m)   LMP 08/13/2012   BMI 48.66 kg/m  GENERAL APPEARANCE:  Well appearing, well developed, well nourished, NAD HEENT:  Atraumatic, normocephalic, oropharynx clear NECK:  Supple without lymphadenopathy or thyromegaly ABDOMEN:  Soft, non-tender, no masses EXTREMITIES:  Moves all extremities well, without clubbing, cyanosis, or edema NEUROLOGIC:  Alert and oriented x 3, normal gait, CN II-XII grossly intact MENTAL STATUS:  appropriate BACK:  Non-tender to palpation, No CVAT SKIN:  Warm, dry, and intact  Results: U/A:  Negative  PVR:  99 ml

## 2023-02-16 DIAGNOSIS — R42 Dizziness and giddiness: Secondary | ICD-10-CM | POA: Diagnosis not present

## 2023-02-16 DIAGNOSIS — I10 Essential (primary) hypertension: Secondary | ICD-10-CM | POA: Diagnosis not present

## 2023-02-16 DIAGNOSIS — G500 Trigeminal neuralgia: Secondary | ICD-10-CM | POA: Diagnosis not present

## 2023-02-16 DIAGNOSIS — R768 Other specified abnormal immunological findings in serum: Secondary | ICD-10-CM | POA: Diagnosis not present

## 2023-02-17 DIAGNOSIS — R7303 Prediabetes: Secondary | ICD-10-CM | POA: Diagnosis not present

## 2023-02-17 DIAGNOSIS — R768 Other specified abnormal immunological findings in serum: Secondary | ICD-10-CM | POA: Diagnosis not present

## 2023-02-17 DIAGNOSIS — I10 Essential (primary) hypertension: Secondary | ICD-10-CM | POA: Diagnosis not present

## 2023-02-17 DIAGNOSIS — E782 Mixed hyperlipidemia: Secondary | ICD-10-CM | POA: Diagnosis not present

## 2023-02-28 DIAGNOSIS — Z6841 Body Mass Index (BMI) 40.0 and over, adult: Secondary | ICD-10-CM | POA: Diagnosis not present

## 2023-02-28 DIAGNOSIS — K219 Gastro-esophageal reflux disease without esophagitis: Secondary | ICD-10-CM | POA: Diagnosis not present

## 2023-02-28 DIAGNOSIS — K449 Diaphragmatic hernia without obstruction or gangrene: Secondary | ICD-10-CM | POA: Diagnosis not present

## 2023-02-28 DIAGNOSIS — Z79899 Other long term (current) drug therapy: Secondary | ICD-10-CM | POA: Diagnosis not present

## 2023-03-01 DIAGNOSIS — K449 Diaphragmatic hernia without obstruction or gangrene: Secondary | ICD-10-CM | POA: Diagnosis not present

## 2023-03-01 DIAGNOSIS — Z79899 Other long term (current) drug therapy: Secondary | ICD-10-CM | POA: Diagnosis not present

## 2023-03-01 DIAGNOSIS — Z6841 Body Mass Index (BMI) 40.0 and over, adult: Secondary | ICD-10-CM | POA: Diagnosis not present

## 2023-03-02 DIAGNOSIS — K449 Diaphragmatic hernia without obstruction or gangrene: Secondary | ICD-10-CM | POA: Diagnosis not present

## 2023-03-02 DIAGNOSIS — Z79899 Other long term (current) drug therapy: Secondary | ICD-10-CM | POA: Diagnosis not present

## 2023-03-02 DIAGNOSIS — Z6841 Body Mass Index (BMI) 40.0 and over, adult: Secondary | ICD-10-CM | POA: Diagnosis not present

## 2023-03-03 ENCOUNTER — Other Ambulatory Visit: Payer: Self-pay | Admitting: Urology

## 2023-03-03 ENCOUNTER — Telehealth: Payer: Self-pay | Admitting: Urology

## 2023-03-03 DIAGNOSIS — N3941 Urge incontinence: Secondary | ICD-10-CM

## 2023-03-03 MED ORDER — SOLIFENACIN SUCCINATE 10 MG PO TABS: 10.0000 mg | ORAL_TABLET | Freq: Every day | ORAL | 5 refills | Status: DC

## 2023-03-03 NOTE — Telephone Encounter (Signed)
brought medications with her to the ER they forgot to give them back to her, she left without them and they no longer have the medication. Requesting a refill on her bladder medications

## 2023-03-22 DIAGNOSIS — F4320 Adjustment disorder, unspecified: Secondary | ICD-10-CM | POA: Diagnosis not present

## 2023-03-30 DIAGNOSIS — Z13820 Encounter for screening for osteoporosis: Secondary | ICD-10-CM | POA: Diagnosis not present

## 2023-03-30 DIAGNOSIS — Z713 Dietary counseling and surveillance: Secondary | ICD-10-CM | POA: Diagnosis not present

## 2023-03-30 DIAGNOSIS — E2839 Other primary ovarian failure: Secondary | ICD-10-CM | POA: Diagnosis not present

## 2023-04-11 DIAGNOSIS — Z9884 Bariatric surgery status: Secondary | ICD-10-CM | POA: Diagnosis not present

## 2023-04-11 DIAGNOSIS — E860 Dehydration: Secondary | ICD-10-CM | POA: Diagnosis not present

## 2023-04-12 DIAGNOSIS — E860 Dehydration: Secondary | ICD-10-CM | POA: Diagnosis not present

## 2023-04-12 DIAGNOSIS — Z9884 Bariatric surgery status: Secondary | ICD-10-CM | POA: Diagnosis not present

## 2023-04-26 DIAGNOSIS — Z9884 Bariatric surgery status: Secondary | ICD-10-CM | POA: Diagnosis not present

## 2023-04-26 DIAGNOSIS — Z713 Dietary counseling and surveillance: Secondary | ICD-10-CM | POA: Diagnosis not present

## 2023-05-02 DIAGNOSIS — R42 Dizziness and giddiness: Secondary | ICD-10-CM | POA: Diagnosis not present

## 2023-05-02 DIAGNOSIS — M542 Cervicalgia: Secondary | ICD-10-CM | POA: Diagnosis not present

## 2023-05-02 DIAGNOSIS — G500 Trigeminal neuralgia: Secondary | ICD-10-CM | POA: Diagnosis not present

## 2023-05-04 DIAGNOSIS — L905 Scar conditions and fibrosis of skin: Secondary | ICD-10-CM | POA: Diagnosis not present

## 2023-05-04 DIAGNOSIS — Z1283 Encounter for screening for malignant neoplasm of skin: Secondary | ICD-10-CM | POA: Diagnosis not present

## 2023-05-04 DIAGNOSIS — Z08 Encounter for follow-up examination after completed treatment for malignant neoplasm: Secondary | ICD-10-CM | POA: Diagnosis not present

## 2023-05-04 DIAGNOSIS — D225 Melanocytic nevi of trunk: Secondary | ICD-10-CM | POA: Diagnosis not present

## 2023-05-04 DIAGNOSIS — Z85820 Personal history of malignant melanoma of skin: Secondary | ICD-10-CM | POA: Diagnosis not present

## 2023-05-10 DIAGNOSIS — K219 Gastro-esophageal reflux disease without esophagitis: Secondary | ICD-10-CM | POA: Diagnosis not present

## 2023-05-10 DIAGNOSIS — Z79899 Other long term (current) drug therapy: Secondary | ICD-10-CM | POA: Diagnosis not present

## 2023-05-10 DIAGNOSIS — G4733 Obstructive sleep apnea (adult) (pediatric): Secondary | ICD-10-CM | POA: Diagnosis not present

## 2023-05-10 DIAGNOSIS — K2970 Gastritis, unspecified, without bleeding: Secondary | ICD-10-CM | POA: Diagnosis not present

## 2023-05-10 DIAGNOSIS — K449 Diaphragmatic hernia without obstruction or gangrene: Secondary | ICD-10-CM | POA: Diagnosis not present

## 2023-05-10 DIAGNOSIS — Z9884 Bariatric surgery status: Secondary | ICD-10-CM | POA: Diagnosis not present

## 2023-05-10 DIAGNOSIS — Z9049 Acquired absence of other specified parts of digestive tract: Secondary | ICD-10-CM | POA: Diagnosis not present

## 2023-05-10 DIAGNOSIS — R112 Nausea with vomiting, unspecified: Secondary | ICD-10-CM | POA: Diagnosis not present

## 2023-05-15 DIAGNOSIS — M25562 Pain in left knee: Secondary | ICD-10-CM | POA: Diagnosis not present

## 2023-05-15 DIAGNOSIS — L309 Dermatitis, unspecified: Secondary | ICD-10-CM | POA: Diagnosis not present

## 2023-05-15 DIAGNOSIS — M1712 Unilateral primary osteoarthritis, left knee: Secondary | ICD-10-CM | POA: Diagnosis not present

## 2023-05-15 DIAGNOSIS — R030 Elevated blood-pressure reading, without diagnosis of hypertension: Secondary | ICD-10-CM | POA: Diagnosis not present

## 2023-05-15 DIAGNOSIS — I10 Essential (primary) hypertension: Secondary | ICD-10-CM | POA: Diagnosis not present

## 2023-05-15 DIAGNOSIS — G8929 Other chronic pain: Secondary | ICD-10-CM | POA: Diagnosis not present

## 2023-05-18 DIAGNOSIS — I10 Essential (primary) hypertension: Secondary | ICD-10-CM | POA: Diagnosis not present

## 2023-05-18 DIAGNOSIS — F338 Other recurrent depressive disorders: Secondary | ICD-10-CM | POA: Diagnosis not present

## 2023-05-18 DIAGNOSIS — M1712 Unilateral primary osteoarthritis, left knee: Secondary | ICD-10-CM | POA: Diagnosis not present

## 2023-05-22 DIAGNOSIS — L710 Perioral dermatitis: Secondary | ICD-10-CM | POA: Diagnosis not present

## 2023-05-24 DIAGNOSIS — L710 Perioral dermatitis: Secondary | ICD-10-CM | POA: Diagnosis not present

## 2023-05-25 DIAGNOSIS — R52 Pain, unspecified: Secondary | ICD-10-CM | POA: Diagnosis not present

## 2023-05-25 DIAGNOSIS — M1712 Unilateral primary osteoarthritis, left knee: Secondary | ICD-10-CM | POA: Diagnosis not present

## 2023-05-25 DIAGNOSIS — L710 Perioral dermatitis: Secondary | ICD-10-CM | POA: Diagnosis not present

## 2023-05-25 DIAGNOSIS — I10 Essential (primary) hypertension: Secondary | ICD-10-CM | POA: Diagnosis not present

## 2023-05-25 DIAGNOSIS — M2242 Chondromalacia patellae, left knee: Secondary | ICD-10-CM | POA: Diagnosis not present

## 2023-05-29 DIAGNOSIS — E041 Nontoxic single thyroid nodule: Secondary | ICD-10-CM | POA: Diagnosis not present

## 2023-05-29 DIAGNOSIS — J341 Cyst and mucocele of nose and nasal sinus: Secondary | ICD-10-CM | POA: Diagnosis not present

## 2023-05-29 DIAGNOSIS — M47812 Spondylosis without myelopathy or radiculopathy, cervical region: Secondary | ICD-10-CM | POA: Diagnosis not present

## 2023-05-29 DIAGNOSIS — R6884 Jaw pain: Secondary | ICD-10-CM | POA: Diagnosis not present

## 2023-05-29 DIAGNOSIS — M4802 Spinal stenosis, cervical region: Secondary | ICD-10-CM | POA: Diagnosis not present

## 2023-05-29 DIAGNOSIS — R9082 White matter disease, unspecified: Secondary | ICD-10-CM | POA: Diagnosis not present

## 2023-05-29 DIAGNOSIS — H748X2 Other specified disorders of left middle ear and mastoid: Secondary | ICD-10-CM | POA: Diagnosis not present

## 2023-06-02 DIAGNOSIS — L981 Factitial dermatitis: Secondary | ICD-10-CM | POA: Diagnosis not present

## 2023-06-02 DIAGNOSIS — L710 Perioral dermatitis: Secondary | ICD-10-CM | POA: Diagnosis not present

## 2023-06-05 DIAGNOSIS — E041 Nontoxic single thyroid nodule: Secondary | ICD-10-CM | POA: Diagnosis not present

## 2023-06-09 DIAGNOSIS — L710 Perioral dermatitis: Secondary | ICD-10-CM | POA: Diagnosis not present

## 2023-06-12 DIAGNOSIS — R42 Dizziness and giddiness: Secondary | ICD-10-CM | POA: Diagnosis not present

## 2023-06-12 DIAGNOSIS — I10 Essential (primary) hypertension: Secondary | ICD-10-CM | POA: Diagnosis not present

## 2023-06-12 DIAGNOSIS — E041 Nontoxic single thyroid nodule: Secondary | ICD-10-CM | POA: Diagnosis not present

## 2023-06-12 DIAGNOSIS — J341 Cyst and mucocele of nose and nasal sinus: Secondary | ICD-10-CM | POA: Diagnosis not present

## 2023-06-22 DIAGNOSIS — Z713 Dietary counseling and surveillance: Secondary | ICD-10-CM | POA: Diagnosis not present

## 2023-06-22 DIAGNOSIS — E569 Vitamin deficiency, unspecified: Secondary | ICD-10-CM | POA: Diagnosis not present

## 2023-06-22 DIAGNOSIS — Z9884 Bariatric surgery status: Secondary | ICD-10-CM | POA: Diagnosis not present

## 2023-06-27 DIAGNOSIS — R42 Dizziness and giddiness: Secondary | ICD-10-CM | POA: Diagnosis not present

## 2023-06-27 DIAGNOSIS — H938X2 Other specified disorders of left ear: Secondary | ICD-10-CM | POA: Diagnosis not present

## 2023-06-29 ENCOUNTER — Ambulatory Visit (INDEPENDENT_AMBULATORY_CARE_PROVIDER_SITE_OTHER): Payer: BC Managed Care – PPO | Admitting: Urology

## 2023-06-29 ENCOUNTER — Encounter: Payer: Self-pay | Admitting: Urology

## 2023-06-29 VITALS — BP 100/68 | HR 64 | Ht 68.0 in | Wt 242.0 lb

## 2023-06-29 DIAGNOSIS — N200 Calculus of kidney: Secondary | ICD-10-CM

## 2023-06-29 DIAGNOSIS — N261 Atrophy of kidney (terminal): Secondary | ICD-10-CM | POA: Diagnosis not present

## 2023-06-29 DIAGNOSIS — N3941 Urge incontinence: Secondary | ICD-10-CM

## 2023-06-29 DIAGNOSIS — Z87442 Personal history of urinary calculi: Secondary | ICD-10-CM

## 2023-06-29 DIAGNOSIS — E042 Nontoxic multinodular goiter: Secondary | ICD-10-CM | POA: Diagnosis not present

## 2023-06-29 NOTE — Progress Notes (Signed)
Assessment: 1. Urge incontinence   2. Nephrolithiasis   3. Renal atrophy, left     Plan: Continue stone prevention Return to office in 1 year  Chief Complaint:  Chief Complaint  Patient presents with   Renal atrophy    History of Present Illness:  [name] is a 52 y.o. female who is seen for further evaluation of nephrolithiasis, left renal atrophy, and urge incontinence.   She has previously seen Dr. Mena Goes for evaluation of a left ureteral calculus. CT imaging from 10/22 showed a left renal cyst, 5 mm obstructing stone in the proximal left ureter with mild hydronephrosis.  Her flank pain resolved.  She was not aware of passing a stone but was not straining her urine.  She had onset of left-sided flank pain again in December 2023.  This resolved spontaneously. Creatinine from 08/24/2022: 1.21 CT  abdomen and pelvis without contrast from 09/13/2022 showed severe left hydronephrosis and ureteral dilation to a 4 mm calculus in the distal left ureter, left parenchymal atrophy noted as compared to study from 10/22.  She was seen in 2/24 with urinary frequency, nocturia, and urinary incontinence. She reported onset of nocturia >1-year ago.  She was having nocturia 2-3 times per night.  She was initially treated with amitriptyline and ropinirole for restless leg syndrome.  She reports an initial improvement in her symptoms.  Since November 2023, she has had worsening of her urinary symptoms.  She reported daytime frequency voiding every 30 minutes to 2 hours, urgency, nocturia up to 8 times per night, and urge incontinence.  No stress incontinence.  No dysuria or gross hematuria.  No recent UTIs.  She was not using any incontinence pads.  She was not on any medical therapy for her symptoms. She has been diagnosed with sleep apnea but does not use a CPAP.  She underwent cystoscopy, bilateral retrograde pyelograms, left ureteroscopic laser lithotripsy with stone manipulation and  insertion of left ureteral stent on 09/20/2022.  The stone was removed completely.  She has a left ureteral stent in place.  She presented on 10/04/2022 for possible stent removal.  She continued on Macrobid daily.  She was also taking Pyridium and Vesicare.  She was having significant stent related symptoms with frequency, urgency, and incontinence.  She also was having some discomfort in the left flank area.  She reported lower extremity swelling and cramping of her upper and lower extremities.  No fever or chills.  No nausea or vomiting.  She is not having gross hematuria. Her urinalysis was suspicious for a possible UTI.  She was given Rocephin 1 g IM and started on cefdinir. Resolve MDX urine culture showed no growth. Creatinine stable at 1.17 on 10/04/22.  Her stent was removed on 10/10/22.  At her visit in 4/24, she was doing well. No flank pain.  No dysuria or gross hematuria.  Her urinary symptoms were improved.  She continued with nocturia x 1.  She continued on Solifenacin with control of her urge incontinence. Renal ultrasound from 11/21/2022 showed no hydronephrosis and a left renal cyst. Lasix renogram from 12/01/2022 showed split renal function of 19% on the left and 81% on the right without evidence of obstruction to either kidney. Cr 12/24:  0.89  She returns today for scheduled follow-up.  No flank pain.  No dysuria or gross hematuria.  She has discontinued the Solifenacin and has not had any problems with incontinence.  She reports nocturia 0-1 time per night.   Portions of  the above documentation were copied from a prior visit for review purposes only.   Past Medical History:  Past Medical History:  Diagnosis Date   Acute recurrent streptococcal tonsillitis 12/31/2015   Complication of anesthesia 1999   urinary retention   Fibromyalgia    GERD (gastroesophageal reflux disease)    History of kidney stones    Hyperlipidemia 08/07/2015   Left thyroid nodule 03/31/2013    Melanoma (HCC)    right hip and knee   Migraine    Neck pain on right side 12/23/2012   Obesity    Palpitations 08/10/2014   Shortness of breath    when anemic   Sinusitis 08/07/2015   with allergies   Sleep apnea    C-Pap    Past Surgical History:  Past Surgical History:  Procedure Laterality Date   49 HOUR PH STUDY N/A 04/12/2017   Procedure: 24 HOUR PH STUDY WITH IMPEDIANCE;  Surgeon: Napoleon Form, MD;  Location: [facility];  Service: Endoscopy;  Laterality: N/A;   ABDOMINAL HYSTERECTOMY  2013   BACK SURGERY  1999   BILATERAL SALPINGECTOMY Bilateral 09/13/2012   Procedure: BILATERAL SALPINGECTOMY;  Surgeon: Willodean Rosenthal, MD;  Location: [facility];  Service: Gynecology;  Laterality: Bilateral;   CHOLECYSTECTOMY  2012   COLONOSCOPY     CYSTOSCOPY/URETEROSCOPY/HOLMIUM LASER/STENT PLACEMENT Left 09/20/2022   Procedure: CYSTOSCOPY/URETEROSCOPY/HOLMIUM LASER/STENT PLACEMENT;  Surgeon: Milderd Meager., MD;  Location: [facility];  Service: Urology;  Laterality: Left;   ESOPHAGEAL MANOMETRY N/A 04/12/2017   Procedure: ESOPHAGEAL MANOMETRY (EM);  Surgeon: Napoleon Form, MD;  Location: [facility];  Service: Endoscopy;  Laterality: N/A;   LAPAROSCOPIC ASSISTED VAGINAL HYSTERECTOMY N/A 09/13/2012   Procedure: LAPAROSCOPIC ASSISTED VAGINAL HYSTERECTOMY;  Surgeon: Willodean Rosenthal, MD;  Location: [facility];  Service: Gynecology;  Laterality: N/A;   MELANOMA EXCISION  2011   stage I and II   WISDOM TOOTH EXTRACTION  1989    Allergies:  Allergies  Allergen Reactions   Seroquel [Quetiapine Fumarate] Nausea And Vomiting    Nightmares and vomiting    Adhesive [Tape] Dermatitis    Band- Aid adhesive   Aleve [Naproxen Sodium]     Makes hands, legs, and feet swell   Carbamazepine     Hallucinations and dizziness   Gabapentin Other (See Comments)    Weight gain   Mold Extract [Trichophyton]     Family History:  Family History  Problem Relation Age of Onset    Colitis Father        crohn's   Heart disease Father        grandfather   Heart attack Father 67   Arthritis Father        s/p back surgery   Other Mother        neurological autoimmune disease, chorea in trunk   Eczema Mother    Asthma Mother    Arthritis Maternal Grandmother    Lupus Maternal Grandmother    Dementia Maternal Grandmother    CVA Maternal Grandmother    Arthritis Paternal Grandmother    Heart disease Paternal Grandmother        chf   Asthma Paternal Grandmother    Diabetes Paternal Grandfather    Kidney disease Paternal Grandfather    Hypertension Paternal Grandfather    Hyperlipidemia Paternal Grandfather    Cancer Maternal Grandfather        bone   Diabetes Unknown        grandfather   Irritable bowel syndrome Unknown  Kidney disease Unknown        grandfather   Arthritis Other    Cancer Other        breast   Hyperlipidemia Other    Mental illness Other    Emphysema Maternal Uncle    Colon cancer Neg Hx    Allergic rhinitis Neg Hx    Angioedema Neg Hx    Immunodeficiency Neg Hx    Urticaria Neg Hx     Social History:  Social History   Tobacco Use   Smoking status: Never   Smokeless tobacco: Never   Tobacco comments:    never used tobacco  Vaping Use   Vaping status: Never Used  Substance Use Topics   Alcohol use: No    Alcohol/week: 0.0 standard drinks of alcohol   Drug use: No    ROS: Constitutional:  Negative for fever, chills, weight loss CV: Negative for chest pain, previous MI, hypertension Respiratory:  Negative for shortness of breath, wheezing, sleep apnea, frequent cough GI:  Negative for nausea, vomiting, bloody stool, GERD  Physical exam: LMP 08/13/2012  GENERAL APPEARANCE:  Well appearing, well developed, well nourished, NAD HEENT:  Atraumatic, normocephalic, oropharynx clear NECK:  Supple without lymphadenopathy or thyromegaly ABDOMEN:  Soft, non-tender, no masses EXTREMITIES:  Moves all extremities well,  without clubbing, cyanosis, or edema NEUROLOGIC:  Alert and oriented x 3, normal gait, CN II-XII grossly intact MENTAL STATUS:  appropriate BACK:  Non-tender to palpation, No CVAT SKIN:  Warm, dry, and intact  Results: U/A:  0-5 WBC, 0-2 RBC, calcium oxalate crystals

## 2023-06-30 LAB — MICROSCOPIC EXAMINATION

## 2023-06-30 LAB — URINALYSIS, ROUTINE W REFLEX MICROSCOPIC
Bilirubin, UA: NEGATIVE
Glucose, UA: NEGATIVE
Leukocytes,UA: NEGATIVE
Nitrite, UA: NEGATIVE
RBC, UA: NEGATIVE
Specific Gravity, UA: >1.03 — ABNORMAL HIGH (ref 1.005–1.030)
Urobilinogen, Ur: 0.2 mg/dL (ref 0.2–1.0)
pH, UA: 5.5 (ref 5.0–7.5)

## 2023-07-03 DIAGNOSIS — H938X2 Other specified disorders of left ear: Secondary | ICD-10-CM | POA: Diagnosis not present

## 2023-07-06 DIAGNOSIS — M1712 Unilateral primary osteoarthritis, left knee: Secondary | ICD-10-CM | POA: Diagnosis not present
